# Patient Record
Sex: Female | Born: 1955 | Race: White | Hispanic: No | Marital: Married | Smoking: Former smoker
Health system: Southern US, Community
[De-identification: ages and names within clinical notes are randomized; demographics above are authoritative.]

## PROBLEM LIST (undated history)

## (undated) DIAGNOSIS — Z1501 Genetic susceptibility to malignant neoplasm of breast: Secondary | ICD-10-CM

## (undated) DIAGNOSIS — J3089 Other allergic rhinitis: Secondary | ICD-10-CM

## (undated) DIAGNOSIS — Z8742 Personal history of other diseases of the female genital tract: Secondary | ICD-10-CM

## (undated) DIAGNOSIS — J302 Other seasonal allergic rhinitis: Secondary | ICD-10-CM

## (undated) DIAGNOSIS — Z8481 Family history of carrier of genetic disease: Secondary | ICD-10-CM

## (undated) DIAGNOSIS — B009 Herpesviral infection, unspecified: Secondary | ICD-10-CM

## (undated) DIAGNOSIS — Z973 Presence of spectacles and contact lenses: Secondary | ICD-10-CM

## (undated) DIAGNOSIS — I10 Essential (primary) hypertension: Secondary | ICD-10-CM

## (undated) DIAGNOSIS — Z803 Family history of malignant neoplasm of breast: Secondary | ICD-10-CM

## (undated) DIAGNOSIS — Z1509 Genetic susceptibility to other malignant neoplasm: Secondary | ICD-10-CM

## (undated) DIAGNOSIS — Z8041 Family history of malignant neoplasm of ovary: Secondary | ICD-10-CM

## (undated) DIAGNOSIS — Z15060 Genetic susceptibility to colorectal cancer: Secondary | ICD-10-CM

## (undated) DIAGNOSIS — L309 Dermatitis, unspecified: Secondary | ICD-10-CM

## (undated) DIAGNOSIS — C50911 Malignant neoplasm of unspecified site of right female breast: Secondary | ICD-10-CM

## (undated) DIAGNOSIS — Z87898 Personal history of other specified conditions: Secondary | ICD-10-CM

## (undated) HISTORY — DX: Family history of malignant neoplasm of ovary: Z80.41

## (undated) HISTORY — DX: Family history of malignant neoplasm of breast: Z80.3

## (undated) HISTORY — DX: Dermatitis, unspecified: L30.9

## (undated) HISTORY — DX: Genetic susceptibility to malignant neoplasm of breast: Z15.09

## (undated) HISTORY — PX: COLONOSCOPY: SHX174

## (undated) HISTORY — DX: Genetic susceptibility to malignant neoplasm of breast: Z15.01

## (undated) HISTORY — DX: Essential (primary) hypertension: I10

## (undated) HISTORY — DX: Family history of carrier of genetic disease: Z84.81

## (undated) HISTORY — DX: Genetic susceptibility to colorectal cancer: Z15.060

---

## 1998-02-24 ENCOUNTER — Other Ambulatory Visit: Admission: RE | Admit: 1998-02-24 | Discharge: 1998-02-24 | Payer: Self-pay | Admitting: *Deleted

## 1999-04-07 ENCOUNTER — Other Ambulatory Visit: Admission: RE | Admit: 1999-04-07 | Discharge: 1999-04-07 | Payer: Self-pay | Admitting: *Deleted

## 2000-04-06 ENCOUNTER — Other Ambulatory Visit: Admission: RE | Admit: 2000-04-06 | Discharge: 2000-04-06 | Payer: Self-pay | Admitting: *Deleted

## 2001-04-24 ENCOUNTER — Other Ambulatory Visit: Admission: RE | Admit: 2001-04-24 | Discharge: 2001-04-24 | Payer: Self-pay | Admitting: *Deleted

## 2002-05-02 ENCOUNTER — Other Ambulatory Visit: Admission: RE | Admit: 2002-05-02 | Discharge: 2002-05-02 | Payer: Self-pay | Admitting: *Deleted

## 2003-05-13 ENCOUNTER — Other Ambulatory Visit: Admission: RE | Admit: 2003-05-13 | Discharge: 2003-05-13 | Payer: Self-pay | Admitting: *Deleted

## 2004-06-21 ENCOUNTER — Other Ambulatory Visit: Admission: RE | Admit: 2004-06-21 | Discharge: 2004-06-21 | Payer: Self-pay | Admitting: *Deleted

## 2005-08-09 ENCOUNTER — Other Ambulatory Visit: Admission: RE | Admit: 2005-08-09 | Discharge: 2005-08-09 | Payer: Self-pay | Admitting: *Deleted

## 2006-02-07 HISTORY — PX: DILATION AND CURETTAGE OF UTERUS: SHX78

## 2006-06-27 ENCOUNTER — Ambulatory Visit: Payer: Self-pay

## 2006-06-27 ENCOUNTER — Ambulatory Visit: Payer: Self-pay | Admitting: Cardiology

## 2006-06-27 DIAGNOSIS — Z87898 Personal history of other specified conditions: Secondary | ICD-10-CM

## 2006-06-27 HISTORY — DX: Personal history of other specified conditions: Z87.898

## 2006-09-25 ENCOUNTER — Other Ambulatory Visit: Admission: RE | Admit: 2006-09-25 | Discharge: 2006-09-25 | Payer: Self-pay | Admitting: *Deleted

## 2007-09-25 ENCOUNTER — Other Ambulatory Visit: Admission: RE | Admit: 2007-09-25 | Discharge: 2007-09-25 | Payer: Self-pay | Admitting: Gynecology

## 2008-03-11 ENCOUNTER — Ambulatory Visit (HOSPITAL_COMMUNITY): Admission: RE | Admit: 2008-03-11 | Discharge: 2008-03-11 | Payer: Self-pay | Admitting: Internal Medicine

## 2010-06-22 NOTE — Procedures (Signed)
[facility]                              EXERCISE TREADMILL   NAME:Chelsea Salinas, Chelsea Salinas                       MRN:          [id number]  DATE:06/27/2006                            DOB:          December 24, 1955    INDICATIONS FOR STUDY:  Abnormal EKG and history of hypertension.   Baseline EKG showed T wave inversion in lead III and poor R wave  progression across the anterior precordial leads.   Her blood pressure when she arrived was 154/98.  She said this morning  it was 118 systolic.  She is a bit anxious.   We exercised on the Bruce protocol for 10 minutes and 19 seconds.  Her  peak heart rate was 164, which is 97% of predicted maximum heart rate.  Met level achieved was 12.2.   Her blood pressure increased from 154/98 until 206/92.  There were no  EKG changes.  Her blood pressure came down appropriately post exercise.   CONCLUSION:  1. Negative adequate exercise treadmill.  2. Poor R wave progression across the anterior precordium, probably      normal variant.  3. Stress-induced hypertension.  4. Accentuated blood pressure response to exercise.   Overall, this is a negative adequate study.  I reinforced this to the  patient.  She will follow up with her blood pressure with Dr. Elisabeth Most.     Thomas C. Daleen Squibb, MD, [facility]  Electronically Signed    TCW/MedQ  DD: 06/27/2006  DT: 06/27/2006  Job #: [id number]   cc:   Lovenia Kim, D.O.

## 2011-02-08 LAB — HM MAMMOGRAPHY: HM Mammogram: NORMAL

## 2011-11-08 LAB — HM PAP SMEAR: HM Pap smear: NORMAL

## 2012-12-19 ENCOUNTER — Other Ambulatory Visit: Payer: Self-pay | Admitting: Internal Medicine

## 2012-12-19 MED ORDER — MONTELUKAST SODIUM 10 MG PO TABS: 10.0000 mg | ORAL_TABLET | Freq: Every day | ORAL | Status: DC

## 2013-01-20 ENCOUNTER — Encounter: Payer: Self-pay | Admitting: Internal Medicine

## 2013-01-20 DIAGNOSIS — T7840XA Allergy, unspecified, initial encounter: Secondary | ICD-10-CM | POA: Insufficient documentation

## 2013-01-20 DIAGNOSIS — I10 Essential (primary) hypertension: Secondary | ICD-10-CM | POA: Insufficient documentation

## 2013-01-20 DIAGNOSIS — L309 Dermatitis, unspecified: Secondary | ICD-10-CM | POA: Insufficient documentation

## 2013-01-20 DIAGNOSIS — J45909 Unspecified asthma, uncomplicated: Secondary | ICD-10-CM | POA: Insufficient documentation

## 2013-01-21 ENCOUNTER — Other Ambulatory Visit: Payer: Self-pay | Admitting: Emergency Medicine

## 2013-01-23 ENCOUNTER — Ambulatory Visit (INDEPENDENT_AMBULATORY_CARE_PROVIDER_SITE_OTHER): Payer: 59 | Admitting: Emergency Medicine

## 2013-01-23 ENCOUNTER — Encounter: Payer: Self-pay | Admitting: Emergency Medicine

## 2013-01-23 VITALS — BP 118/82 | HR 74 | Temp 98.6°F | Resp 16 | Ht 65.0 in | Wt 152.0 lb

## 2013-01-23 DIAGNOSIS — N951 Menopausal and female climacteric states: Secondary | ICD-10-CM

## 2013-01-23 DIAGNOSIS — R200 Anesthesia of skin: Secondary | ICD-10-CM

## 2013-01-23 DIAGNOSIS — I10 Essential (primary) hypertension: Secondary | ICD-10-CM

## 2013-01-23 DIAGNOSIS — R232 Flushing: Secondary | ICD-10-CM

## 2013-01-23 DIAGNOSIS — E538 Deficiency of other specified B group vitamins: Secondary | ICD-10-CM

## 2013-01-23 DIAGNOSIS — E782 Mixed hyperlipidemia: Secondary | ICD-10-CM | POA: Insufficient documentation

## 2013-01-23 DIAGNOSIS — J309 Allergic rhinitis, unspecified: Secondary | ICD-10-CM

## 2013-01-23 LAB — CBC WITH DIFFERENTIAL/PLATELET
Basophils Absolute: 0 10*3/uL (ref 0.0–0.1)
Basophils Relative: 1 % (ref 0–1)
Eosinophils Absolute: 0.1 10*3/uL (ref 0.0–0.7)
Eosinophils Relative: 2 % (ref 0–5)
Lymphocytes Relative: 41 % (ref 12–46)
MCHC: 33.9 g/dL (ref 30.0–36.0)
MCV: 95.6 fL (ref 78.0–100.0)
Platelets: 206 10*3/uL (ref 150–400)
RDW: 13.8 % (ref 11.5–15.5)
WBC: 4.2 10*3/uL (ref 4.0–10.5)

## 2013-01-23 LAB — HEPATIC FUNCTION PANEL
Bilirubin, Direct: 0.1 mg/dL (ref 0.0–0.3)
Indirect Bilirubin: 0.3 mg/dL (ref 0.0–0.9)
Total Protein: 7 g/dL (ref 6.0–8.3)

## 2013-01-23 LAB — BASIC METABOLIC PANEL WITH GFR
CO2: 30 mEq/L (ref 19–32)
Calcium: 9.8 mg/dL (ref 8.4–10.5)
Creat: 0.67 mg/dL (ref 0.50–1.10)

## 2013-01-23 LAB — LIPID PANEL
HDL: 125 mg/dL (ref >39)
LDL Cholesterol: 82 mg/dL (ref 0–99)
Triglycerides: 79 mg/dL (ref <150)
VLDL: 16 mg/dL (ref 0–40)

## 2013-01-23 MED ORDER — CLONIDINE HCL 0.2 MG PO TABS: 0.2000 mg | ORAL_TABLET | Freq: Every day | ORAL | Status: DC

## 2013-01-23 MED ORDER — OLMESARTAN MEDOXOMIL 40 MG PO TABS: 40.0000 mg | ORAL_TABLET | Freq: Every day | ORAL | Status: DC

## 2013-01-23 MED ORDER — MONTELUKAST SODIUM 10 MG PO TABS: 10.0000 mg | ORAL_TABLET | Freq: Every day | ORAL | Status: DC

## 2013-01-23 MED ORDER — VENLAFAXINE HCL ER 75 MG PO CP24: ORAL_CAPSULE | ORAL | Status: DC

## 2013-01-23 NOTE — Patient Instructions (Signed)
Carpal Tunnel Syndrome The carpal tunnel is an area under the skin of the palm of your hand. Nerves, blood vessels, and strong tissues (tendons) pass through the tunnel. The tunnel can become puffy (swollen). If this happens, a nerve can be pinched in the wrist. This causes carpal tunnel syndrome.  HOME CARE  Take all medicine as told by your doctor.  If you were given a splint, wear it as told. Wear it at night or at times when your doctor told you to.  Rest your wrist from the activity that causes your pain.  Put ice on your wrist after long periods of wrist activity.  Put ice in a plastic bag.  Place a towel between your skin and the bag.  Leave the ice on for 15-20 minutes, 03-04 times a day.  Keep all doctor visits as told. GET HELP RIGHT AWAY IF:  You have new problems you cannot explain.  Your problems get worse and medicine does not help. MAKE SURE YOU:   Understand these instructions.  Will watch your condition.  Will get help right away if you are not doing well or get worse. Document Released: 01/13/2011 Document Revised: 04/18/2011 Document Reviewed: 01/13/2011 Exit[employer] Patient Information 2014 [employer].  

## 2013-01-27 NOTE — Progress Notes (Signed)
Subjective:    Patient ID: Chelsea Salinas, female    DOB: 09-Feb-1955, 57 y.o.   MRN: [id number]  HPI Comments: 57 yo female presents for 3 month F/U for HTN, Cholesterol, Pre-Dm, D. Deficient. She has been eating healthy and exercising with riding bike. LAST LABS T 209 TG 87 HDL 115 LDL 77 MAG 1.7 D 67 A1C 5.2   She has noticed mild increase with allergy drainage over last week but notes clear thin production. Denies sinus pain.  She notes left hand numbness after sleeping and riding bike has increased over last couple of weeks. She notes usually resolves with rest or change of position and Advil.  She has a history of abnormal pap with Dr Chevis Pretty will get repeat in 6 months. She denies any changes.       Hypertension  Asthma Associated symptoms include postnasal drip. Her past medical history is significant for asthma.    Current Outpatient Prescriptions on File Prior to Visit  Medication Sig Dispense Refill  . aspirin 81 MG tablet Take 81 mg by mouth daily.      . Multiple Vitamins-Minerals (MULTIVITAMIN WITH MINERALS) tablet Take 1 tablet by mouth daily.      . Omega-3 Fatty Acids (FISH OIL) 1000 MG CAPS Take by mouth daily.      . vitamin B-12 (CYANOCOBALAMIN) 100 MCG tablet Take 100 mcg by mouth every other day.        No current facility-administered medications on file prior to visit.    Review of patient's allergies indicates no known allergies.  Past Medical History  Diagnosis Date  . Hypertension   . Asthma   . Allergy   . Eczema      Review of Systems  HENT: Positive for postnasal drip.   Neurological: Positive for numbness.  All other systems reviewed and are negative.   BP 118/82  Pulse 74  Temp(Src) 98.6 F (37 C) (Temporal)  Resp 16  Ht 5\' 5"  (1.651 m)  Wt 152 lb (68.947 kg)  BMI 25.29 kg/m2     Objective:   Physical Exam  Nursing note and vitals reviewed. Constitutional: She is oriented to person, place, and time. She appears  well-developed and well-nourished. No distress.  HENT:  Head: Normocephalic and atraumatic.  Right Ear: External ear normal.  Left Ear: External ear normal.  Nose: Nose normal.  Mouth/Throat: Oropharynx is clear and moist.  Eyes: Conjunctivae and EOM are normal.  Neck: Normal range of motion. Neck supple. No JVD present. No thyromegaly present.  Cardiovascular: Normal rate, regular rhythm, normal heart sounds and intact distal pulses.   Pulmonary/Chest: Effort normal and breath sounds normal.  Abdominal: Soft. Bowel sounds are normal. She exhibits no distension and no mass. There is no tenderness. There is no rebound and no guarding.  Musculoskeletal: Normal range of motion. She exhibits no edema and no tenderness.  Lymphadenopathy:    She has no cervical adenopathy.  Neurological: She is alert and oriented to person, place, and time. She has normal reflexes. No cranial nerve deficit. Coordination normal.  No obvious deficit/ tenderness  Skin: Skin is warm and dry. No rash noted. No erythema. No pallor.  Psychiatric: She has a normal mood and affect. Her behavior is normal. Judgment and thought content normal.          Assessment & Plan:   1.  3 month F/U for HTN, Cholesterol, Pre-Dm, D. Deficient. Needs healthy diet, cardio QD and obtain healthy weight. Check  Labs, Check BP if >130/80 call office 2. ? b12 Def with HX of hand tingling/ numbness- Check labs, sleep and bike hygiene explained. Advised cockup splint with activity and sleep. 3. Allergic rhinitis- Allegra OTC, increase H2o, allergy hygiene explained.

## 2013-02-13 NOTE — Progress Notes (Signed)
LMOM TO CALL & SCHEDULE 4 MTH

## 2013-06-19 ENCOUNTER — Ambulatory Visit (INDEPENDENT_AMBULATORY_CARE_PROVIDER_SITE_OTHER): Payer: 59 | Admitting: Physician Assistant

## 2013-06-19 ENCOUNTER — Encounter: Payer: Self-pay | Admitting: Physician Assistant

## 2013-06-19 VITALS — BP 142/88 | HR 80 | Temp 98.2°F | Resp 16 | Ht 65.0 in | Wt 147.0 lb

## 2013-06-19 DIAGNOSIS — E782 Mixed hyperlipidemia: Secondary | ICD-10-CM

## 2013-06-19 DIAGNOSIS — I10 Essential (primary) hypertension: Secondary | ICD-10-CM

## 2013-06-19 DIAGNOSIS — J45909 Unspecified asthma, uncomplicated: Secondary | ICD-10-CM

## 2013-06-19 DIAGNOSIS — J309 Allergic rhinitis, unspecified: Secondary | ICD-10-CM

## 2013-06-19 DIAGNOSIS — N951 Menopausal and female climacteric states: Secondary | ICD-10-CM

## 2013-06-19 DIAGNOSIS — R232 Flushing: Secondary | ICD-10-CM

## 2013-06-19 MED ORDER — MONTELUKAST SODIUM 10 MG PO TABS: 10.0000 mg | ORAL_TABLET | Freq: Every day | ORAL | Status: DC

## 2013-06-19 MED ORDER — CLONIDINE HCL 0.2 MG PO TABS: 0.2000 mg | ORAL_TABLET | Freq: Every day | ORAL | Status: DC

## 2013-06-19 MED ORDER — VENLAFAXINE HCL ER 75 MG PO CP24: ORAL_CAPSULE | ORAL | Status: DC

## 2013-06-19 MED ORDER — TRIAMCINOLONE ACETONIDE 0.1 % EX CREA: 1.0000 "application " | TOPICAL_CREAM | Freq: Two times a day (BID) | CUTANEOUS | Status: DC

## 2013-06-19 MED ORDER — OLMESARTAN MEDOXOMIL 40 MG PO TABS: 40.0000 mg | ORAL_TABLET | Freq: Every day | ORAL | Status: DC

## 2013-06-19 NOTE — Progress Notes (Signed)
HPI 58 y.o. female  presents for 3 month follow up with hypertension, hyperlipidemia, prediabetes and vitamin D. Her blood pressure has not been controlled at home, she does not, today their BP is BP: 142/88 mmHg She does workout. She denies check chest pain, shortness of breath, dizziness.  She is not on cholesterol medication and denies myalgias. Her cholesterol is at goal. The cholesterol last visit was:   Lab Results  Component Value Date   CHOL 223* 01/23/2013   HDL 125 01/23/2013   LDLCALC 82 01/23/2013   TRIG 79 01/23/2013   CHOLHDL 1.8 01/23/2013   Last A1C in the office was: 5.2 Patient is on Vitamin D supplement.     Current Medications:  Current Outpatient Prescriptions on File Prior to Visit  Medication Sig Dispense Refill  . aspirin 81 MG tablet Take 81 mg by mouth daily.      . Cholecalciferol (VITAMIN D-3) 1000 UNITS CAPS Take by mouth 2 (two) times daily.      . cloNIDine (CATAPRES) 0.2 MG tablet Take 1 tablet (0.2 mg total) by mouth at bedtime.  90 tablet  1  . Flaxseed, Linseed, (FLAX SEEDS PO) Take by mouth daily.      . Magnesium 250 MG TABS Take by mouth every other day.      . montelukast (SINGULAIR) 10 MG tablet Take 1 tablet (10 mg total) by mouth daily.  90 tablet  2  . Multiple Vitamins-Minerals (MULTIVITAMIN WITH MINERALS) tablet Take 1 tablet by mouth daily.      Marland Kitchen olmesartan (BENICAR) 40 MG tablet Take 1 tablet (40 mg total) by mouth daily. 1/2 daily  90 tablet  1  . Omega-3 Fatty Acids (FISH OIL) 1000 MG CAPS Take by mouth daily.      Marland Kitchen venlafaxine XR (EFFEXOR-XR) 75 MG 24 hr capsule TAKE 2 CAPSULES BY MOUTH EVERY MORNING AND 1 BY MOUTH EVERY EVENING  270 capsule  1  . vitamin B-12 (CYANOCOBALAMIN) 100 MCG tablet Take 100 mcg by mouth every other day.        No current facility-administered medications on file prior to visit.   Medical History:  Past Medical History  Diagnosis Date  . Hypertension   . Asthma   . Allergy   . Eczema    Allergies:  No Known Allergies   Review of Systems: [X]  = complains of  [ ]  = denies  General: Fatigue [ ]  Fever [ ]  Chills [ ]  Weakness [ ]   Insomnia [ ]  Eyes: Redness [ ]  Blurred vision [ ]  Diplopia [ ]   ENT: Congestion [ ]  Sinus Pain [ ]  Post Nasal Drip [ ]  Sore Throat [ ]  Earache [ ]   Cardiac: Chest pain/pressure [ ]  SOB [ ]  Orthopnea [ ]   Palpitations [ ]   Paroxysmal nocturnal dyspnea[ ]  Claudication [ ]  Edema [ ]   Pulmonary: Cough [ ]  Wheezing[ ]   SOB [ ]   Snoring [ ]   GI: Nausea [ ]  Vomiting[ ]  Dysphagia[ ]  Heartburn[ ]  Abdominal pain [ ]  Constipation [ ] ; Diarrhea [ ] ; BRBPR [ ]  Melena[ ]  GU: Hematuria[ ]  Dysuria [ ]  Nocturia[ ]  Urgency [ ]   Hesitancy [ ]  Discharge [ ]  Neuro: Headaches[ ]  Vertigo[ ]  Paresthesias[ ]  Spasm [ ]  Speech changes [ ]  Incoordination [ ]   Ortho: Arthritis [ ]  Joint pain [ ]  Muscle pain [ ]  Joint swelling [ ]  Back Pain [ ]  Skin:  Rash [ ]   Pruritis [ ]  Change in skin lesion [ ]   Psych: Depression[ ]  Anxiety[ ]  Confusion [ ]  Memory loss [ ]   Heme/Lypmh: Bleeding [ ]  Bruising [ ]  Enlarged lymph nodes [ ]   Endocrine: Visual blurring [ ]  Paresthesia [ ]  Polyuria [ ]  Polydypsea [ ]    Heat/cold intolerance [ ]  Hypoglycemia [ ]   Family history- Review and unchanged Social history- Review and unchanged Physical Exam: BP 142/88  Pulse 80  Temp(Src) 98.2 F (36.8 C)  Resp 16  Ht 5\' 5"  (1.651 m)  Wt 147 lb (66.679 kg)  BMI 24.46 kg/m2 Wt Readings from Last 3 Encounters:  06/19/13 147 lb (66.679 kg)  01/23/13 152 lb (68.947 kg)   General Appearance: Well nourished, in no apparent distress. Eyes: PERRLA, EOMs, conjunctiva no swelling or erythema Sinuses: No Frontal/maxillary tenderness ENT/Mouth: Ext aud canals clear, TMs without erythema, bulging. No erythema, swelling, or exudate on post pharynx.  Tonsils not swollen or erythematous. Hearing normal.  Neck: Supple, thyroid normal.  Respiratory: Respiratory effort normal, BS equal bilaterally without rales, rhonchi,  wheezing or stridor.  Cardio: RRR with no MRGs. Brisk peripheral pulses without edema.  Abdomen: Soft, + BS.  Non tender, no guarding, rebound, hernias, masses. Lymphatics: Non tender without lymphadenopathy.  Musculoskeletal: Full ROM, 5/5 strength, normal gait.  Skin: Warm, dry without rashes, lesions, ecchymosis.  Neuro: Cranial nerves intact. Normal muscle tone, no cerebellar symptoms. Sensation intact.  Psych: Awake and oriented X 3, normal affect, Insight and Judgment appropriate.   Assessment and Plan:  Hypertension: Continue medication, monitor blood pressure at home. Continue DASH diet. Cholesterol: Continue diet and exercise.  Vitamin D Def-  continue medications.  Will do visits every 6 months  Continue diet and meds as discussed. Further disposition pending results of labs.  [name] 10:36 AM

## 2013-06-19 NOTE — Patient Instructions (Signed)

## 2013-06-27 ENCOUNTER — Ambulatory Visit: Payer: Self-pay | Admitting: Emergency Medicine

## 2013-10-03 ENCOUNTER — Encounter: Payer: Self-pay | Admitting: Emergency Medicine

## 2013-11-12 ENCOUNTER — Encounter: Payer: Self-pay | Admitting: Physician Assistant

## 2013-11-12 ENCOUNTER — Ambulatory Visit (INDEPENDENT_AMBULATORY_CARE_PROVIDER_SITE_OTHER): Payer: 59 | Admitting: Physician Assistant

## 2013-11-12 VITALS — BP 118/70 | HR 80 | Temp 97.9°F | Resp 16 | Ht 65.0 in | Wt 154.0 lb

## 2013-11-12 DIAGNOSIS — I10 Essential (primary) hypertension: Secondary | ICD-10-CM

## 2013-11-12 DIAGNOSIS — Z0001 Encounter for general adult medical examination with abnormal findings: Secondary | ICD-10-CM

## 2013-11-12 NOTE — Progress Notes (Signed)
Complete Physical  Assessment and Plan: Hypertension-- continue medications, DASH diet, exercise and monitor at home. Call if greater than 130/80.   Asthma- controlled, continue singulair  Allergy- continue meds  Eczema- continue cream PRN    Discussed med's effects and SE's. Screening labs and tests as requested with regular follow-up as recommended.  HPI 58 y.o. female  presents for a complete physical.  Her blood pressure has been controlled at home, today their BP is BP: 118/70 mmHg She does workout. She denies chest pain, shortness of breath, dizziness.  She is not on cholesterol medication and denies myalgias. Her cholesterol is at goal. The cholesterol last visit was:   Lab Results  Component Value Date   CHOL 223* 01/23/2013   HDL 125 01/23/2013   LDLCALC 82 01/23/2013   TRIG 79 01/23/2013   CHOLHDL 1.8 01/23/2013   Last A1C in the office was: 5.2 Patient is on Vitamin D supplement.   She follows with Dr. Daneil Dolin, she had an abnormal pap and had a negative colposcopy 10/08/2013, she will have her MGM, DEXA and a repeat pap in Dec.  She has atopy with eczema, allergies, and asthma. She has been a little wheezing.   Current Medications:  Current Outpatient Prescriptions on File Prior to Visit  Medication Sig Dispense Refill  . aspirin 81 MG tablet Take 81 mg by mouth daily.      . Cetirizine HCl (ZYRTEC PO) Take by mouth.      . Cholecalciferol (VITAMIN D-3) 1000 UNITS CAPS Take by mouth 2 (two) times daily.      . cloNIDine (CATAPRES) 0.2 MG tablet Take 1 tablet (0.2 mg total) by mouth at bedtime.  30 tablet  4  . Flaxseed, Linseed, (FLAX SEEDS PO) Take by mouth daily.      . Magnesium 250 MG TABS Take by mouth every other day.      . montelukast (SINGULAIR) 10 MG tablet Take 1 tablet (10 mg total) by mouth daily.  30 tablet  5  . Multiple Vitamins-Minerals (MULTIVITAMIN WITH MINERALS) tablet Take 1 tablet by mouth daily.      Marland Kitchen olmesartan (BENICAR) 40 MG tablet Take 1  tablet (40 mg total) by mouth daily.  30 tablet  2  . Omega-3 Fatty Acids (FISH OIL) 1000 MG CAPS Take by mouth daily.      Marland Kitchen triamcinolone cream (KENALOG) 0.1 % Apply 1 application topically 2 (two) times daily.  454 g  0  . venlafaxine XR (EFFEXOR-XR) 75 MG 24 hr capsule TAKE 2 CAPSULES BY MOUTH EVERY MORNING AND 1 BY MOUTH EVERY EVENING  270 capsule  1  . vitamin B-12 (CYANOCOBALAMIN) 100 MCG tablet Take 100 mcg by mouth every other day.        No current facility-administered medications on file prior to visit.   Health Maintenance:   Immunization History  Administered Date(s) Administered  . Td 02/08/1996, 02/07/2006   Tetanus: 2008 Pneumovax: declines Flu vaccine: declines Zostavax: wants when she turns 58 Pap: 02/2013 MGM: 01/2013 will get in Dec DEXA:2013 normal at OB/GYN- will also get in Dec Colonoscopy: 2007 due 2017 EGD: DEE: 1 year ago Stress test 2008 normal  Allergies: No Known Allergies Medical History:  Past Medical History  Diagnosis Date  . Hypertension   . Asthma   . Allergy   . Eczema    Surgical History:  Past Surgical History  Procedure Laterality Date  . Dilation and curettage of uterus  2008   Family  History:  Family History  Problem Relation Age of Onset  . Polymyalgia rheumatica Mother   . Hypertension Father   . Heart disease Father   . Cancer Paternal Aunt     ovarian/cervical   Social History:  History  Substance Use Topics  . Smoking status: Former Smoker    Quit date: 02/08/1996  . Smokeless tobacco: Not on file  . Alcohol Use: Not on file   Review of Systems: [X]  = complains of  [ ]  = denies  General: Fatigue [ ]  Fever [ ]  Chills [ ]  Weakness [ ]   Insomnia [ ] Weight change [ ]  Night sweats [ ]   Change in appetite [ ]  Eyes: Redness [ ]  Blurred vision [ ]  Diplopia [ ]  Discharge [ ]   ENT: Congestion [ ]  Sinus Pain [ ]  Post Nasal Drip [ ]  Sore Throat [ ]  Earache [ ]  hearing loss [ ]  Tinnitus [ ]  Snoring [ ]   Cardiac: Chest  pain/pressure [ ]  SOB [ ]  Orthopnea [ ]   Palpitations [ ]   Paroxysmal nocturnal dyspnea[ ]  Claudication [ ]  Edema [ ]   Pulmonary: Cough [ ]  Wheezing[ ]   SOB [ ]   Pleurisy [ ]   GI: Nausea [ ]  Vomiting[ ]  Dysphagia[ ]  Heartburn[ ]  Abdominal pain [ ]  Constipation [ ] ; Diarrhea [ ]  BRBPR [ ]  Melena[ ]  Bloating [ ]  Hemorrhoids [ ]   GU: Hematuria[ ]  Dysuria [ ]  Nocturia[ ]  Urgency [ ]   Hesitancy [ ]  Discharge [ ]  Frequency [ ]   Breast:  Breast lumps [ ]   nipple discharge [ ]    Neuro: Headaches[ ]  Vertigo[ ]  Paresthesias[ ]  Spasm [ ]  Speech changes [ ]  Incoordination [ ]   Ortho: Arthritis [ ]  Joint pain [ ]  Muscle pain [ ]  Joint swelling [ ]  Back Pain [ ]  Skin:  Rash [ ]   Pruritis [ ]  Change in skin lesion [ ]   Psych: Depression[ ]  Anxiety[ ]  Confusion [ ]  Memory loss [ ]   Heme/Lypmh: Bleeding [ ]  Bruising [ ]  Enlarged lymph nodes [ ]   Endocrine: Visual blurring [ ]  Paresthesia [ ]  Polyuria [ ]  Polydypsea [ ]    Heat/cold intolerance [ ]  Hypoglycemia [ ]   Physical Exam: Estimated body mass index is 25.63 kg/(m^2) as calculated from the following:   Height as of this encounter: 5\' 5"  (1.651 m).   Weight as of this encounter: 154 lb (69.854 kg). BP 118/70  Pulse 80  Temp(Src) 97.9 F (36.6 C)  Resp 16  Ht 5\' 5"  (1.651 m)  Wt 154 lb (69.854 kg)  BMI 25.63 kg/m2 General Appearance: Well nourished, in no apparent distress. Eyes: PERRLA, EOMs, conjunctiva no swelling or erythema, normal fundi and vessels. Sinuses: No Frontal/maxillary tenderness ENT/Mouth: Ext aud canals clear, normal light reflex with TMs without erythema, bulging.  Good dentition. No erythema, swelling, or exudate on post pharynx. Tonsils not swollen or erythematous. Hearing normal.  Neck: Supple, thyroid normal. No bruits Respiratory: Respiratory effort normal, BS equal bilaterally without rales, rhonchi, wheezing or stridor. Cardio: RRR without murmurs, rubs or gallops. Brisk peripheral pulses without edema.  Chest: symmetric,  with normal excursions and percussion. Breasts: defer Abdomen: Soft, +BS. Non tender, no guarding, rebound, hernias, masses, or organomegaly. .  Lymphatics: Non tender without lymphadenopathy.  Genitourinary: defer Musculoskeletal: Full ROM all peripheral extremities,5/5 strength, and normal gait. Skin: Warm, dry without rashes, lesions, ecchymosis.  Neuro: Cranial nerves intact, reflexes equal bilaterally. Normal muscle tone, no cerebellar symptoms. Sensation intact.  Psych: Awake  and oriented X 3, normal affect, Insight and Judgment appropriate.    [name] 2:16 PM

## 2013-11-12 NOTE — Patient Instructions (Signed)

## 2013-11-13 LAB — CBC WITH DIFFERENTIAL/PLATELET
Basophils Absolute: 0.1 10*3/uL (ref 0.0–0.1)
Basophils Relative: 1 % (ref 0–1)
Eosinophils Absolute: 0.1 10*3/uL (ref 0.0–0.7)
Eosinophils Relative: 1 % (ref 0–5)
HEMATOCRIT: 38.2 % (ref 36.0–46.0)
HEMOGLOBIN: 12.9 g/dL (ref 12.0–15.0)
LYMPHS ABS: 1.6 10*3/uL (ref 0.7–4.0)
LYMPHS PCT: 25 % (ref 12–46)
MCH: 33.1 pg (ref 26.0–34.0)
MCHC: 33.8 g/dL (ref 30.0–36.0)
MCV: 97.9 fL (ref 78.0–100.0)
MONOS PCT: 10 % (ref 3–12)
Monocytes Absolute: 0.7 10*3/uL (ref 0.1–1.0)
NEUTROS ABS: 4.1 10*3/uL (ref 1.7–7.7)
NEUTROS PCT: 63 % (ref 43–77)
Platelets: 239 10*3/uL (ref 150–400)
RBC: 3.9 MIL/uL (ref 3.87–5.11)
RDW: 14.2 % (ref 11.5–15.5)
WBC: 6.5 10*3/uL (ref 4.0–10.5)

## 2013-11-13 LAB — MICROALBUMIN / CREATININE URINE RATIO
CREATININE, URINE: 162.3 mg/dL
MICROALB UR: 0.7 mg/dL (ref <2.0)
Microalb Creat Ratio: 4.3 mg/g (ref 0.0–30.0)

## 2013-11-13 LAB — IRON AND TIBC
%SAT: 33 % (ref 20–55)
Iron: 109 ug/dL (ref 42–145)
TIBC: 331 ug/dL (ref 250–470)
UIBC: 222 ug/dL (ref 125–400)

## 2013-11-13 LAB — VITAMIN B12: VITAMIN B 12: 820 pg/mL (ref 211–911)

## 2013-11-13 LAB — BASIC METABOLIC PANEL WITH GFR
BUN: 19 mg/dL (ref 6–23)
CHLORIDE: 101 meq/L (ref 96–112)
CO2: 30 meq/L (ref 19–32)
Calcium: 9.8 mg/dL (ref 8.4–10.5)
Creat: 0.9 mg/dL (ref 0.50–1.10)
GFR, EST AFRICAN AMERICAN: 81 mL/min
GFR, Est Non African American: 70 mL/min
GLUCOSE: 84 mg/dL (ref 70–99)
POTASSIUM: 4.9 meq/L (ref 3.5–5.3)
SODIUM: 140 meq/L (ref 135–145)

## 2013-11-13 LAB — LIPID PANEL
Cholesterol: 233 mg/dL — ABNORMAL HIGH (ref 0–200)
HDL: 149 mg/dL (ref >39)
LDL CALC: 62 mg/dL (ref 0–99)
TRIGLYCERIDES: 108 mg/dL (ref <150)
Total CHOL/HDL Ratio: 1.6 Ratio
VLDL: 22 mg/dL (ref 0–40)

## 2013-11-13 LAB — HEPATIC FUNCTION PANEL
ALK PHOS: 79 U/L (ref 39–117)
ALT: 16 U/L (ref 0–35)
AST: 24 U/L (ref 0–37)
Albumin: 4.6 g/dL (ref 3.5–5.2)
BILIRUBIN DIRECT: 0.1 mg/dL (ref 0.0–0.3)
BILIRUBIN INDIRECT: 0.3 mg/dL (ref 0.2–1.2)
Total Bilirubin: 0.4 mg/dL (ref 0.2–1.2)
Total Protein: 7.3 g/dL (ref 6.0–8.3)

## 2013-11-13 LAB — URINALYSIS, ROUTINE W REFLEX MICROSCOPIC
BILIRUBIN URINE: NEGATIVE
GLUCOSE, UA: NEGATIVE mg/dL
Hgb urine dipstick: NEGATIVE
Leukocytes, UA: NEGATIVE
Nitrite: NEGATIVE
PROTEIN: NEGATIVE mg/dL
SPECIFIC GRAVITY, URINE: 1.026 (ref 1.005–1.030)
Urobilinogen, UA: 1 mg/dL (ref 0.0–1.0)
pH: 6 (ref 5.0–8.0)

## 2013-11-13 LAB — FERRITIN: Ferritin: 343 ng/mL — ABNORMAL HIGH (ref 10–291)

## 2013-11-13 LAB — INSULIN, FASTING: Insulin fasting, serum: 13.2 u[IU]/mL (ref 2.0–19.6)

## 2013-11-13 LAB — MAGNESIUM: MAGNESIUM: 1.7 mg/dL (ref 1.5–2.5)

## 2013-11-13 LAB — HEMOGLOBIN A1C
Hgb A1c MFr Bld: 5.7 % — ABNORMAL HIGH (ref <5.7)
MEAN PLASMA GLUCOSE: 117 mg/dL — AB (ref <117)

## 2013-11-13 LAB — TSH: TSH: 1.273 u[IU]/mL (ref 0.350–4.500)

## 2013-11-13 LAB — VITAMIN D 25 HYDROXY (VIT D DEFICIENCY, FRACTURES): Vit D, 25-Hydroxy: 69 ng/mL (ref 30–89)

## 2013-12-14 ENCOUNTER — Other Ambulatory Visit: Payer: Self-pay | Admitting: Physician Assistant

## 2013-12-14 ENCOUNTER — Other Ambulatory Visit: Payer: Self-pay | Admitting: Emergency Medicine

## 2013-12-23 ENCOUNTER — Other Ambulatory Visit: Payer: Self-pay | Admitting: Physician Assistant

## 2013-12-24 ENCOUNTER — Other Ambulatory Visit: Payer: Self-pay | Admitting: Physician Assistant

## 2014-01-27 ENCOUNTER — Other Ambulatory Visit: Payer: Self-pay | Admitting: Internal Medicine

## 2014-01-28 ENCOUNTER — Other Ambulatory Visit: Payer: Self-pay

## 2014-01-28 MED ORDER — CLONIDINE HCL 0.2 MG PO TABS: 0.2000 mg | ORAL_TABLET | Freq: Every day | ORAL | Status: DC

## 2014-02-12 ENCOUNTER — Other Ambulatory Visit: Payer: Self-pay | Admitting: *Deleted

## 2014-02-12 MED ORDER — VENLAFAXINE HCL ER 75 MG PO CP24: ORAL_CAPSULE | ORAL | Status: DC

## 2014-02-26 ENCOUNTER — Other Ambulatory Visit: Payer: Self-pay | Admitting: Internal Medicine

## 2014-03-28 ENCOUNTER — Other Ambulatory Visit: Payer: Self-pay | Admitting: Internal Medicine

## 2014-04-23 ENCOUNTER — Other Ambulatory Visit: Payer: Self-pay | Admitting: Internal Medicine

## 2014-05-13 ENCOUNTER — Other Ambulatory Visit: Payer: Self-pay | Admitting: Physician Assistant

## 2014-05-15 ENCOUNTER — Ambulatory Visit: Payer: Self-pay | Admitting: Physician Assistant

## 2014-05-26 ENCOUNTER — Encounter: Payer: Self-pay | Admitting: Physician Assistant

## 2014-05-26 ENCOUNTER — Ambulatory Visit (INDEPENDENT_AMBULATORY_CARE_PROVIDER_SITE_OTHER): Payer: 59 | Admitting: Physician Assistant

## 2014-05-26 ENCOUNTER — Other Ambulatory Visit: Payer: Self-pay | Admitting: Physician Assistant

## 2014-05-26 VITALS — BP 156/98 | HR 86 | Temp 98.2°F | Resp 16 | Ht 65.0 in | Wt 154.0 lb

## 2014-05-26 DIAGNOSIS — R7303 Prediabetes: Secondary | ICD-10-CM

## 2014-05-26 DIAGNOSIS — E782 Mixed hyperlipidemia: Secondary | ICD-10-CM

## 2014-05-26 DIAGNOSIS — Z79899 Other long term (current) drug therapy: Secondary | ICD-10-CM

## 2014-05-26 DIAGNOSIS — I10 Essential (primary) hypertension: Secondary | ICD-10-CM

## 2014-05-26 DIAGNOSIS — R7309 Other abnormal glucose: Secondary | ICD-10-CM | POA: Insufficient documentation

## 2014-05-26 DIAGNOSIS — E559 Vitamin D deficiency, unspecified: Secondary | ICD-10-CM

## 2014-05-26 DIAGNOSIS — T7840XD Allergy, unspecified, subsequent encounter: Secondary | ICD-10-CM

## 2014-05-26 HISTORY — DX: Prediabetes: R73.03

## 2014-05-26 LAB — CBC WITH DIFFERENTIAL/PLATELET
Basophils Absolute: 0.1 10*3/uL (ref 0.0–0.1)
Basophils Relative: 1 % (ref 0–1)
EOS ABS: 0.1 10*3/uL (ref 0.0–0.7)
Eosinophils Relative: 1 % (ref 0–5)
HEMATOCRIT: 39.3 % (ref 36.0–46.0)
Hemoglobin: 13.1 g/dL (ref 12.0–15.0)
LYMPHS ABS: 1.8 10*3/uL (ref 0.7–4.0)
Lymphocytes Relative: 36 % (ref 12–46)
MCH: 32.7 pg (ref 26.0–34.0)
MCHC: 33.3 g/dL (ref 30.0–36.0)
MCV: 98 fL (ref 78.0–100.0)
MONOS PCT: 13 % — AB (ref 3–12)
MPV: 10.1 fL (ref 8.6–12.4)
Monocytes Absolute: 0.7 10*3/uL (ref 0.1–1.0)
Neutro Abs: 2.5 10*3/uL (ref 1.7–7.7)
Neutrophils Relative %: 49 % (ref 43–77)
PLATELETS: 233 10*3/uL (ref 150–400)
RBC: 4.01 MIL/uL (ref 3.87–5.11)
RDW: 13.6 % (ref 11.5–15.5)
WBC: 5.1 10*3/uL (ref 4.0–10.5)

## 2014-05-26 MED ORDER — ALBUTEROL SULFATE HFA 108 (90 BASE) MCG/ACT IN AERS: 1.0000 | INHALATION_SPRAY | Freq: Four times a day (QID) | RESPIRATORY_TRACT | Status: DC | PRN

## 2014-05-26 MED ORDER — MONTELUKAST SODIUM 10 MG PO TABS: 10.0000 mg | ORAL_TABLET | Freq: Every day | ORAL | Status: DC

## 2014-05-26 MED ORDER — VENLAFAXINE HCL ER 75 MG PO CP24: ORAL_CAPSULE | ORAL | Status: DC

## 2014-05-26 MED ORDER — OLMESARTAN MEDOXOMIL 40 MG PO TABS: 40.0000 mg | ORAL_TABLET | Freq: Every day | ORAL | Status: DC

## 2014-05-26 MED ORDER — CLONIDINE HCL 0.2 MG PO TABS: 0.2000 mg | ORAL_TABLET | Freq: Every day | ORAL | Status: DC

## 2014-05-26 MED ORDER — ESCITALOPRAM OXALATE 10 MG PO TABS: ORAL_TABLET | ORAL | Status: DC

## 2014-05-26 NOTE — Progress Notes (Signed)
Assessment and Plan:  1. Hypertension -Continue medication, monitor blood pressure at home. Continue DASH diet.  Reminder to go to the ER if any CP, SOB, nausea, dizziness, severe HA, changes vision/speech, left arm numbness and tingling and jaw pain.  2. Cholesterol -Continue diet and exercise. Check cholesterol.   3. Prediabetes  -Continue diet and exercise. Check A1C  4. Vitamin D Def - check level and continue medications.   5. Allergies Get on allegra, add flonase, and will give albuterol inhaler.  Call if worse/not better for ABX but does not need at this time  Continue diet and meds as discussed. Further disposition pending results of labs. Over 30 minutes of exam, counseling, chart review, and critical decision making was performed  HPI 59 y.o. female  presents for 3 month follow up on hypertension, cholesterol, prediabetes, and vitamin D deficiency.   Her blood pressure has been controlled at home, today their BP is BP: (!) 156/98 mmHg  She does workout, yoga and walks outside with her dogs. She denies chest pain, shortness of breath, dizziness.  She is on cholesterol medication and denies myalgias. Her cholesterol is at goal. The cholesterol last visit was:   Lab Results  Component Value Date   CHOL 233* 11/12/2013   HDL 149 11/12/2013   LDLCALC 62 11/12/2013   TRIG 108 11/12/2013   CHOLHDL 1.6 11/12/2013    She has been working on diet and exercise for prediabetes, and denies paresthesia of the feet, polydipsia, polyuria and visual disturbances. Last A1C in the office was:  Lab Results  Component Value Date   HGBA1C 5.7* 11/12/2013   Patient is on Vitamin D supplement.   Lab Results  Component Value Date   VD25OH 69 11/12/2013     Has been having allergy symptoms since Friday, she is on zyrtec. She denies fever, chills, cough, SOB, CP.   Current Medications:  Current Outpatient Prescriptions on File Prior to Visit  Medication Sig Dispense Refill  . aspirin  81 MG tablet Take 81 mg by mouth daily.    Marland Kitchen BENICAR 40 MG tablet TAKE 1 TABLET BY MOUTH EVERY DAY 90 tablet 0  . Cetirizine HCl (ZYRTEC PO) Take by mouth.    . Cholecalciferol (VITAMIN D-3) 1000 UNITS CAPS Take by mouth 2 (two) times daily.    . cloNIDine (CATAPRES) 0.2 MG tablet TAKE 1 TABLET BY MOUTH EVERY NIGHT AT BEDTIME 30 tablet 0  . Flaxseed, Linseed, (FLAX SEEDS PO) Take by mouth daily.    . Magnesium 250 MG TABS Take by mouth every other day.    . montelukast (SINGULAIR) 10 MG tablet TAKE 1 TABLET BY MOUTH DAILY 90 tablet 0  . Multiple Vitamins-Minerals (MULTIVITAMIN WITH MINERALS) tablet Take 1 tablet by mouth daily.    . Omega-3 Fatty Acids (FISH OIL) 1000 MG CAPS Take by mouth daily.    Marland Kitchen triamcinolone cream (KENALOG) 0.1 % Apply 1 application topically 2 (two) times daily. 454 g 0  . venlafaxine XR (EFFEXOR-XR) 75 MG 24 hr capsule TAKE 2 CAPSULES BY MOUTH EVERY MORNING AND 1 CAPSULE BY MOUTH EVERY EVENING 270 capsule 0  . vitamin B-12 (CYANOCOBALAMIN) 100 MCG tablet Take 100 mcg by mouth every other day.      No current facility-administered medications on file prior to visit.   Medical History:  Past Medical History  Diagnosis Date  . Hypertension   . Asthma   . Allergy   . Eczema    Allergies: No Known Allergies  Review of Systems:  Review of Systems  Constitutional: Negative.   HENT: Positive for congestion. Negative for ear discharge, ear pain, hearing loss, nosebleeds, sore throat and tinnitus.   Eyes: Negative.   Respiratory: Negative.  Negative for stridor.   Cardiovascular: Negative.   Gastrointestinal: Negative.   Genitourinary: Negative.   Musculoskeletal: Negative.   Skin: Negative.   Neurological: Negative.  Negative for headaches.  Endo/Heme/Allergies: Negative.   Psychiatric/Behavioral: Negative.     Family history- Review and unchanged Social history- Review and unchanged Physical Exam: BP 156/98 mmHg  Pulse 86  Temp(Src) 98.2 F (36.8 C)  (Temporal)  Resp 16  Ht 5\' 5"  (1.651 m)  Wt 154 lb (69.854 kg)  BMI 25.63 kg/m2 Wt Readings from Last 3 Encounters:  05/26/14 154 lb (69.854 kg)  11/12/13 154 lb (69.854 kg)  06/19/13 147 lb (66.679 kg)   General Appearance: Well nourished, in no apparent distress. Eyes: PERRLA, EOMs, conjunctiva no swelling or erythema Sinuses: No Frontal/maxillary tenderness ENT/Mouth: Ext aud canals clear, TMs without erythema, bulging. No erythema, swelling, or exudate on post pharynx.  Tonsils not swollen or erythematous. Hearing normal.  Neck: Supple, thyroid normal.  Respiratory: Respiratory effort normal, BS equal bilaterally without rales, rhonchi, wheezing or stridor.  Cardio: RRR with no MRGs. Brisk peripheral pulses without edema.  Abdomen: Soft, + BS,  Non tender, no guarding, rebound, hernias, masses. Lymphatics: Non tender without lymphadenopathy.  Musculoskeletal: Full ROM, 5/5 strength, Normal gait Skin: Warm, dry without rashes, lesions, ecchymosis.  Neuro: Cranial nerves intact. Normal muscle tone, no cerebellar symptoms. Psych: Awake and oriented X 3, normal affect, Insight and Judgment appropriate.    [name], PA-C 3:22 PM [facility]

## 2014-05-26 NOTE — Patient Instructions (Signed)
- Try the Flonase or Nasonex. Remember to spray each nostril twice towards the outer part of your eye.  Do not sniff but instead pinch your nose and tilt your head back to help the medicine get into your sinuses.  The best time to do this is at bedtime.Stop if you get blurred vision or nose bleeds.   Sinusitis can be uncomfortable. People with sinusitis have congestion with yellow/green/gray discharge, sinus pain/pressure, pain around the eyes. Sinus infections almost ALWAYS stem from a viral infection and antibiotics don't work against a virus. Even when bacteria is responsible, the infections usually clear up on their own in a week or so.   PLEASE TRY TO DO OVER THE COUNTER TREATMENT AND PREDNISONE FOR 5-7 DAYS AND IF YOU ARE NOT GETTING BETTER OR GETTING WORSE THEN YOU CAN START ON AN ANTIBIOTIC GIVEN.  Can take the prednisone AT NIGHT WITH DINNER, it take 8-12 hours to start working so it will NOT affect your sleeping if you take it at night with your food!! Take two pills the first night and 1 or two pill the second night and then 1 pill the other nights.   Risk of antibiotic use: About 1 in 4 people who take antibiotics have side effects including stomach problems, dizziness, or rashes. Those problems clear up soon after stopping the drugs, but in rare cases antibiotics can cause severe allergic reaction. Over use of antibiotics also encourages the growth of bacteria that can't be controlled easily with drugs. That makes you more vunerable to antibiotic-resistant infections and undermines the benefits of antibiotics for others.   Waste of Money: Antibiotics often aren't very expensive, but any money spent on unnecessary drugs is money down the drain.   When are antibiotics needed? Only when symptoms last longer than a week.  Start to improve but then worsen again  -It can take up to 2 weeks to feel better.   -If you do not get better in 7-10 days (Have fever, facial pain, dental pain and  swelling), then please call the office and it is now appropriate to start an antibiotic.   -Please take Tylenol or Ibuprofen for pain. -Acetaminiphen 325mg  orally every 4-6 hours for pain.  Max: 10 per day -Ibuprofen 200mg  orally every 6-8 hours for pain.  Take with food to avoid ulcers.   Max 10 per day  Please pick one of the over the counter allergy medications below and take it once daily for allergies.  Claritin or loratadine cheapest but likely the weakest  Zyrtec or certizine at night because it can make you sleepy The strongest is allegra or fexafinadine  Cheapest at [facility], [facility], [facility]  -While drinking fluids, pinch and hold nose close and swallow.  This will help open up your eustachian tubes to drain the fluid behind your ear drums. -Try steam showers to open your nasal passages.   Drink lots of water to stay hydrated and to thin mucous.  Flonase/Nasonex is to help the inflammation.  Take 2 sprays in each nostril at bedtime.  Make sure you spray towards the outside of each nostril towards the outer corner of your eye, hold nose close and tilt head back.  This will help the medication get into your sinuses.  If you do not like this medication, then use saline nasal sprays same directions as above for Flonase. Stop the medication right away if you get blurring of your vision or nose bleeds.  Sinusitis Sinusitis is redness, soreness, and inflammation  of the paranasal sinuses. Paranasal sinuses are air pockets within the bones of your face (beneath the eyes, the middle of the forehead, or above the eyes). In healthy paranasal sinuses, mucus is able to drain out, and air is able to circulate through them by way of your nose. However, when your paranasal sinuses are inflamed, mucus and air can become trapped. This can allow bacteria and other germs to grow and cause infection. Sinusitis can develop quickly and last only a short time (acute) or continue over a long period (chronic).  Sinusitis that lasts for more than 12 weeks is considered chronic.  CAUSES  Causes of sinusitis include: Allergies. Structural abnormalities, such as displacement of the cartilage that separates your nostrils (deviated septum), which can decrease the air flow through your nose and sinuses and affect sinus drainage. Functional abnormalities, such as when the small hairs (cilia) that line your sinuses and help remove mucus do not work properly or are not present. SIGNS AND SYMPTOMS  Symptoms of acute and chronic sinusitis are the same. The primary symptoms are pain and pressure around the affected sinuses. Other symptoms include: Upper toothache. Earache. Headache. Bad breath. Decreased sense of smell and taste. A cough, which worsens when you are lying flat. Fatigue. Fever. Thick drainage from your nose, which often is green and may contain pus (purulent). Swelling and warmth over the affected sinuses. DIAGNOSIS  Your health care provider will perform a physical exam. During the exam, your health care provider may: Look in your nose for signs of abnormal growths in your nostrils (nasal polyps).  Tap over the affected sinus to check for signs of infection. View the inside of your sinuses (endoscopy) using an imaging device that has a light attached (endoscope). If your health care provider suspects that you have chronic sinusitis, one or more of the following tests may be recommended: Allergy tests. Nasal culture. A sample of mucus is taken from your nose, sent to a lab, and screened for bacteria. Nasal cytology. A sample of mucus is taken from your nose and examined by your health care provider to determine if your sinusitis is related to an allergy. TREATMENT  Most cases of acute sinusitis are related to a viral infection and will resolve on their own within 10 days. Sometimes medicines are prescribed to help relieve symptoms (pain medicine, decongestants, nasal steroid sprays, or saline  sprays).  However, for sinusitis related to a bacterial infection, your health care provider will prescribe antibiotic medicines. These are medicines that will help kill the bacteria causing the infection.  Rarely, sinusitis is caused by a fungal infection. In theses cases, your health care provider will prescribe antifungal medicine. For some cases of chronic sinusitis, surgery is needed. Generally, these are cases in which sinusitis recurs more than 3 times per year, despite other treatments. HOME CARE INSTRUCTIONS  Drink plenty of water. Water helps thin the mucus so your sinuses can drain more easily. Use a humidifier. Inhale steam 3 to 4 times a day (for example, sit in the bathroom with the shower running). Apply a warm, moist washcloth to your face 3 to 4 times a day, or as directed by your health care provider. Use saline nasal sprays to help moisten and clean your sinuses. Take medicines only as directed by your health care provider. If you were prescribed either an antibiotic or antifungal medicine, finish it all even if you start to feel better. SEEK IMMEDIATE MEDICAL CARE IF: You have increasing pain or severe  headaches. You have nausea, vomiting, or drowsiness. You have swelling around your face. You have vision problems. You have a stiff neck. You have difficulty breathing. MAKE SURE YOU:  Understand these instructions. Will watch your condition. Will get help right away if you are not doing well or get worse. Document Released: 01/24/2005 Document Revised: 06/10/2013 Document Reviewed: 02/08/2011 [facility] Patient Information 2015 [place], [place]. This information is not intended to replace advice given to you by your health care provider. Make sure you discuss any questions you have with your health care provider.

## 2014-05-27 LAB — HEMOGLOBIN A1C
HEMOGLOBIN A1C: 5.5 % (ref <5.7)
Mean Plasma Glucose: 111 mg/dL (ref <117)

## 2014-05-27 LAB — BASIC METABOLIC PANEL WITH GFR
BUN: 18 mg/dL (ref 6–23)
CHLORIDE: 98 meq/L (ref 96–112)
CO2: 28 mEq/L (ref 19–32)
Calcium: 9.9 mg/dL (ref 8.4–10.5)
Creat: 0.66 mg/dL (ref 0.50–1.10)
GFR, Est African American: >89 mL/min
GFR, Est Non African American: >89 mL/min
Glucose, Bld: 81 mg/dL (ref 70–99)
POTASSIUM: 4.5 meq/L (ref 3.5–5.3)
Sodium: 137 mEq/L (ref 135–145)

## 2014-05-27 LAB — HEPATIC FUNCTION PANEL
ALT: 16 U/L (ref 0–35)
AST: 21 U/L (ref 0–37)
Albumin: 4.6 g/dL (ref 3.5–5.2)
Alkaline Phosphatase: 95 U/L (ref 39–117)
BILIRUBIN INDIRECT: 0.3 mg/dL (ref 0.2–1.2)
Bilirubin, Direct: 0.1 mg/dL (ref 0.0–0.3)
Total Bilirubin: 0.4 mg/dL (ref 0.2–1.2)
Total Protein: 7.2 g/dL (ref 6.0–8.3)

## 2014-05-27 LAB — INSULIN, FASTING: Insulin fasting, serum: 3.3 u[IU]/mL (ref 2.0–19.6)

## 2014-05-27 LAB — LIPID PANEL
Cholesterol: 237 mg/dL — ABNORMAL HIGH (ref 0–200)
HDL: 134 mg/dL (ref >=46)
LDL Cholesterol: 76 mg/dL (ref 0–99)
Total CHOL/HDL Ratio: 1.8 Ratio
Triglycerides: 134 mg/dL (ref <150)
VLDL: 27 mg/dL (ref 0–40)

## 2014-05-27 LAB — MAGNESIUM: Magnesium: 1.7 mg/dL (ref 1.5–2.5)

## 2014-05-27 LAB — VITAMIN D 25 HYDROXY (VIT D DEFICIENCY, FRACTURES): VIT D 25 HYDROXY: 47 ng/mL (ref 30–100)

## 2014-05-27 LAB — TSH: TSH: 1.307 u[IU]/mL (ref 0.350–4.500)

## 2014-06-11 ENCOUNTER — Other Ambulatory Visit: Payer: Self-pay | Admitting: Physician Assistant

## 2014-08-20 ENCOUNTER — Other Ambulatory Visit: Payer: Self-pay | Admitting: Physician Assistant

## 2014-09-07 ENCOUNTER — Other Ambulatory Visit: Payer: Self-pay | Admitting: Internal Medicine

## 2014-09-18 ENCOUNTER — Other Ambulatory Visit: Payer: Self-pay | Admitting: Physician Assistant

## 2014-09-22 ENCOUNTER — Other Ambulatory Visit: Payer: Self-pay | Admitting: Internal Medicine

## 2014-10-09 ENCOUNTER — Other Ambulatory Visit: Payer: Self-pay | Admitting: Internal Medicine

## 2014-10-23 ENCOUNTER — Other Ambulatory Visit: Payer: Self-pay | Admitting: Internal Medicine

## 2014-11-13 ENCOUNTER — Ambulatory Visit (INDEPENDENT_AMBULATORY_CARE_PROVIDER_SITE_OTHER): Payer: 59 | Admitting: Physician Assistant

## 2014-11-13 ENCOUNTER — Encounter: Payer: Self-pay | Admitting: Physician Assistant

## 2014-11-13 VITALS — BP 130/80 | HR 83 | Temp 97.3°F | Resp 14 | Ht 65.0 in | Wt 157.0 lb

## 2014-11-13 DIAGNOSIS — T7840XD Allergy, unspecified, subsequent encounter: Secondary | ICD-10-CM

## 2014-11-13 DIAGNOSIS — Z23 Encounter for immunization: Secondary | ICD-10-CM

## 2014-11-13 DIAGNOSIS — Z0000 Encounter for general adult medical examination without abnormal findings: Secondary | ICD-10-CM | POA: Diagnosis not present

## 2014-11-13 DIAGNOSIS — Z13220 Encounter for screening for lipoid disorders: Secondary | ICD-10-CM

## 2014-11-13 DIAGNOSIS — J45909 Unspecified asthma, uncomplicated: Secondary | ICD-10-CM

## 2014-11-13 DIAGNOSIS — R232 Flushing: Secondary | ICD-10-CM

## 2014-11-13 DIAGNOSIS — Z79899 Other long term (current) drug therapy: Secondary | ICD-10-CM

## 2014-11-13 DIAGNOSIS — Z1389 Encounter for screening for other disorder: Secondary | ICD-10-CM

## 2014-11-13 DIAGNOSIS — I10 Essential (primary) hypertension: Secondary | ICD-10-CM

## 2014-11-13 DIAGNOSIS — R319 Hematuria, unspecified: Secondary | ICD-10-CM

## 2014-11-13 DIAGNOSIS — E559 Vitamin D deficiency, unspecified: Secondary | ICD-10-CM

## 2014-11-13 DIAGNOSIS — L309 Dermatitis, unspecified: Secondary | ICD-10-CM

## 2014-11-13 LAB — CBC WITH DIFFERENTIAL/PLATELET
BASOS ABS: 0.1 10*3/uL (ref 0.0–0.1)
BASOS PCT: 1 % (ref 0–1)
EOS ABS: 0.1 10*3/uL (ref 0.0–0.7)
EOS PCT: 1 % (ref 0–5)
HCT: 38.6 % (ref 36.0–46.0)
Hemoglobin: 13.3 g/dL (ref 12.0–15.0)
LYMPHS ABS: 1.9 10*3/uL (ref 0.7–4.0)
Lymphocytes Relative: 33 % (ref 12–46)
MCH: 33.3 pg (ref 26.0–34.0)
MCHC: 34.5 g/dL (ref 30.0–36.0)
MCV: 96.5 fL (ref 78.0–100.0)
MONOS PCT: 12 % (ref 3–12)
MPV: 10.1 fL (ref 8.6–12.4)
Monocytes Absolute: 0.7 10*3/uL (ref 0.1–1.0)
Neutro Abs: 3.1 10*3/uL (ref 1.7–7.7)
Neutrophils Relative %: 53 % (ref 43–77)
PLATELETS: 216 10*3/uL (ref 150–400)
RBC: 4 MIL/uL (ref 3.87–5.11)
RDW: 13.7 % (ref 11.5–15.5)
WBC: 5.9 10*3/uL (ref 4.0–10.5)

## 2014-11-13 LAB — BASIC METABOLIC PANEL WITH GFR
BUN: 14 mg/dL (ref 7–25)
CO2: 30 mmol/L (ref 20–31)
CREATININE: 0.74 mg/dL (ref 0.50–0.99)
Calcium: 9.6 mg/dL (ref 8.6–10.4)
Chloride: 97 mmol/L — ABNORMAL LOW (ref 98–110)
GFR, Est Non African American: 88 mL/min (ref >=60)
Glucose, Bld: 100 mg/dL — ABNORMAL HIGH (ref 65–99)
Potassium: 4 mmol/L (ref 3.5–5.3)
SODIUM: 136 mmol/L (ref 135–146)

## 2014-11-13 LAB — HEPATIC FUNCTION PANEL
ALBUMIN: 4.6 g/dL (ref 3.6–5.1)
ALT: 19 U/L (ref 6–29)
AST: 23 U/L (ref 10–35)
Alkaline Phosphatase: 88 U/L (ref 33–130)
Bilirubin, Direct: 0.1 mg/dL (ref <=0.2)
Indirect Bilirubin: 0.3 mg/dL (ref 0.2–1.2)
TOTAL PROTEIN: 7.1 g/dL (ref 6.1–8.1)
Total Bilirubin: 0.4 mg/dL (ref 0.2–1.2)

## 2014-11-13 LAB — MAGNESIUM: Magnesium: 1.7 mg/dL (ref 1.5–2.5)

## 2014-11-13 LAB — LIPID PANEL
CHOLESTEROL: 226 mg/dL — AB (ref 125–200)
HDL: 113 mg/dL (ref >=46)
LDL Cholesterol: 86 mg/dL (ref <130)
TRIGLYCERIDES: 133 mg/dL (ref <150)
Total CHOL/HDL Ratio: 2 Ratio (ref <=5.0)
VLDL: 27 mg/dL (ref <30)

## 2014-11-13 LAB — TSH: TSH: 1.159 u[IU]/mL (ref 0.350–4.500)

## 2014-11-13 MED ORDER — ESCITALOPRAM OXALATE 10 MG PO TABS: ORAL_TABLET | ORAL | Status: DC

## 2014-11-13 MED ORDER — OLMESARTAN MEDOXOMIL 40 MG PO TABS: 40.0000 mg | ORAL_TABLET | Freq: Every day | ORAL | Status: DC

## 2014-11-13 MED ORDER — MONTELUKAST SODIUM 10 MG PO TABS: 10.0000 mg | ORAL_TABLET | Freq: Every day | ORAL | Status: DC

## 2014-11-13 MED ORDER — VENLAFAXINE HCL ER 75 MG PO CP24: ORAL_CAPSULE | ORAL | Status: DC

## 2014-11-13 MED ORDER — ALBUTEROL SULFATE HFA 108 (90 BASE) MCG/ACT IN AERS: 1.0000 | INHALATION_SPRAY | Freq: Four times a day (QID) | RESPIRATORY_TRACT | Status: DC | PRN

## 2014-11-13 MED ORDER — CLONIDINE HCL 0.2 MG PO TABS: ORAL_TABLET | ORAL | Status: DC

## 2014-11-13 NOTE — Progress Notes (Signed)
Complete Physical  Assessment and Plan: Hypertension-- continue medications, DASH diet, exercise and monitor at home. Call if greater than 130/80.   Asthma- controlled, continue singulair  Allergy- continue meds  Eczema- continue cream PRN  Hot flashes- continue medications.  Pneumovax, declines flu vaccine. Abnormal pap- continue follow up OB/GYN   Discussed med's effects and SE's. Screening labs and tests as requested with regular follow-up as recommended.  HPI 59 y.o. female  presents for a complete physical.  Her blood pressure has been controlled at home, today their BP is BP: 130/80 mmHg She does workout. She denies chest pain, shortness of breath, dizziness.  Got new motorcycle, rides horses, and has garden this year.  She is not on cholesterol medication and denies myalgias. Her cholesterol is at goal. The cholesterol last visit was:   Lab Results  Component Value Date   CHOL 237* 05/26/2014   HDL 134 05/26/2014   LDLCALC 76 05/26/2014   TRIG 134 05/26/2014   CHOLHDL 1.8 05/26/2014   Last A1C in the office was:  Lab Results  Component Value Date   HGBA1C 5.5 05/26/2014   Patient is on Vitamin D supplement.   Lab Results  Component Value Date   VD25OH 47 05/26/2014   She follows with Dr. Daneil Dolin, she had an abnormal pap and had a negative colposcopy 10/08/2013.  She has atopy with eczema, allergies, and asthma. She is on flonase for allergies, needs refill albuterol, has not been using it.  She is on lexapro and catapress for hot flashes that help.  Wt Readings from Last 3 Encounters:  11/13/14 157 lb (71.215 kg)  05/26/14 154 lb (69.854 kg)  11/12/13 154 lb (69.854 kg)    Current Medications:  Current Outpatient Prescriptions on File Prior to Visit  Medication Sig Dispense Refill  . aspirin 81 MG tablet Take 81 mg by mouth daily.    . Cetirizine HCl (ZYRTEC PO) Take by mouth.    . Cholecalciferol (VITAMIN D-3) 1000 UNITS CAPS Take by mouth 2 (two) times daily.     . cloNIDine (CATAPRES) 0.2 MG tablet TAKE 1 TABLET(0.2 MG) BY MOUTH AT BEDTIME 90 tablet 0  . Flaxseed, Linseed, (FLAX SEEDS PO) Take by mouth daily.    . Magnesium 250 MG TABS Take by mouth every other day.    . montelukast (SINGULAIR) 10 MG tablet TAKE 1 TABLET BY MOUTH EVERY DAY 30 tablet 3  . Multiple Vitamins-Minerals (MULTIVITAMIN WITH MINERALS) tablet Take 1 tablet by mouth daily.    Marland Kitchen olmesartan (BENICAR) 40 MG tablet Take 1 tablet (40 mg total) by mouth daily. 90 tablet 1  . Omega-3 Fatty Acids (FISH OIL) 1000 MG CAPS Take by mouth daily.    Marland Kitchen triamcinolone cream (KENALOG) 0.1 % Apply 1 application topically 2 (two) times daily. 454 g 0  . venlafaxine XR (EFFEXOR-XR) 75 MG 24 hr capsule TAKE 2 CAPSULES BY MOUTH EVERY MORNING AND 1 CAPSULE BY MOUTH EVERY EVENING 270 capsule 0  . vitamin B-12 (CYANOCOBALAMIN) 100 MCG tablet Take 100 mcg by mouth every other day.     . escitalopram (LEXAPRO) 10 MG tablet TAKE 1/2-1 TABLET BY MOUTH EVERY DAY FOR HOT FLASHES 90 tablet 0  . escitalopram (LEXAPRO) 10 MG tablet TAKE 1/2-1 TABLET BY MOUTH EVERY DAY FOR HOT FLASHES 30 tablet 2   No current facility-administered medications on file prior to visit.   Health Maintenance:   Immunization History  Administered Date(s) Administered  . Td 02/08/1996, 02/07/2006  Tetanus: 2008 Pneumovax: willing to get Prevnar 13: due age 19 Flu vaccine: declines Zostavax: 2015 at [facility] Pap: 10/27/2013, q 6 months, but last 2 was normal, going to follow up Dr. Quincy Simmonds.  MGM: 01/2014 will get in Jan  DEXA: 01/2014 normal at OB/GYN  Colonoscopy: 2007 due 2017 EGD: N/A DEE: 1 year ago Stress test 2008 normal  Patient Care Team: [name], MD as PCP - General (Internal Medicine) Renella Cunas, MD as Consulting Physician (Cardiology) Delila Pereyra, MD as Consulting Physician (Gynecology) [name], MD as Consulting Physician (Dermatology)    Allergies: No Known Allergies Medical History:   Past Medical History  Diagnosis Date  . Hypertension   . Asthma   . Allergy   . Eczema    Surgical History:  Past Surgical History  Procedure Laterality Date  . Dilation and curettage of uterus  2008   Family History:  Family History  Problem Relation Age of Onset  . Polymyalgia rheumatica Mother   . Hypertension Father   . Heart disease Father   . Cancer Paternal Aunt     ovarian/cervical   Social History:  Social History  Substance Use Topics  . Smoking status: Former Smoker    Quit date: 02/08/1996  . Smokeless tobacco: None  . Alcohol Use: None   Review of Systems  Constitutional: Negative.   HENT: Positive for congestion. Negative for ear discharge, ear pain, hearing loss, nosebleeds, sore throat and tinnitus.   Eyes: Negative.   Respiratory: Negative.  Negative for stridor.   Cardiovascular: Negative.   Gastrointestinal: Negative.   Genitourinary: Negative.   Musculoskeletal: Negative.   Skin: Positive for rash. Negative for itching.  Neurological: Negative.  Negative for headaches.  Endo/Heme/Allergies: Negative.   Psychiatric/Behavioral: Negative.      Physical Exam: Estimated body mass index is 26.13 kg/(m^2) as calculated from the following:   Height as of this encounter: 5\' 5"  (1.651 m).   Weight as of this encounter: 157 lb (71.215 kg). BP 130/80 mmHg  Pulse 83  Temp(Src) 97.3 F (36.3 C) (Temporal)  Resp 14  Ht 5\' 5"  (1.651 m)  Wt 157 lb (71.215 kg)  BMI 26.13 kg/m2  SpO2 98% General Appearance: Well nourished, in no apparent distress. Eyes: PERRLA, EOMs, conjunctiva no swelling or erythema, normal fundi and vessels. Sinuses: No Frontal/maxillary tenderness ENT/Mouth: Ext aud canals clear, normal light reflex with TMs without erythema, bulging.  Good dentition. No erythema, swelling, or exudate on post pharynx. Tonsils not swollen or erythematous. Hearing normal.  Neck: Supple, thyroid normal. No bruits Respiratory: Respiratory effort  normal, BS equal bilaterally without rales, rhonchi, wheezing or stridor. Cardio: RRR without murmurs, rubs or gallops. Brisk peripheral pulses without edema.  Chest: symmetric, with normal excursions and percussion. Breasts: defer Abdomen: Soft, +BS. Non tender, no guarding, rebound, hernias, masses, or organomegaly. .  Lymphatics: Non tender without lymphadenopathy.  Genitourinary: defer Musculoskeletal: Full ROM all peripheral extremities,5/5 strength, and normal gait. Skin: + dry scaly patches bilateral hands. Warm, dry without lesions, ecchymosis.  Neuro: Cranial nerves intact, reflexes equal bilaterally. Normal muscle tone, no cerebellar symptoms. Sensation intact.  Psych: Awake and oriented X 3, normal affect, Insight and Judgment appropriate.    [name] 3:02 PM

## 2014-11-13 NOTE — Patient Instructions (Signed)

## 2014-11-14 LAB — URINALYSIS, ROUTINE W REFLEX MICROSCOPIC
Bilirubin Urine: NEGATIVE
Glucose, UA: NEGATIVE
Ketones, ur: NEGATIVE
LEUKOCYTES UA: NEGATIVE
NITRITE: NEGATIVE
PH: 6 (ref 5.0–8.0)
Protein, ur: NEGATIVE
SPECIFIC GRAVITY, URINE: 1.011 (ref 1.001–1.035)

## 2014-11-14 LAB — VITAMIN D 25 HYDROXY (VIT D DEFICIENCY, FRACTURES): Vit D, 25-Hydroxy: 46 ng/mL (ref 30–100)

## 2014-11-14 LAB — URINALYSIS, MICROSCOPIC ONLY
Bacteria, UA: NONE SEEN [HPF]
CASTS: NONE SEEN [LPF]
Crystals: NONE SEEN [HPF]
RBC / HPF: NONE SEEN RBC/HPF (ref <=2)
SQUAMOUS EPITHELIAL / LPF: NONE SEEN [HPF] (ref <=5)
WBC, UA: NONE SEEN WBC/HPF (ref <=5)
Yeast: NONE SEEN [HPF]

## 2014-11-14 LAB — MICROALBUMIN / CREATININE URINE RATIO
CREATININE, URINE: 69.8 mg/dL
MICROALB/CREAT RATIO: 5.7 mg/g (ref 0.0–30.0)
Microalb, Ur: 0.4 mg/dL (ref <2.0)

## 2014-11-14 NOTE — Addendum Note (Signed)
Addended by: [name] R on: 11/14/2014 07:22 AM   Modules accepted: Orders, SmartSet

## 2015-03-02 ENCOUNTER — Other Ambulatory Visit: Payer: Self-pay | Admitting: Internal Medicine

## 2015-04-17 ENCOUNTER — Ambulatory Visit (INDEPENDENT_AMBULATORY_CARE_PROVIDER_SITE_OTHER): Payer: BLUE CROSS/BLUE SHIELD | Admitting: Obstetrics and Gynecology

## 2015-04-17 ENCOUNTER — Encounter: Payer: Self-pay | Admitting: Obstetrics and Gynecology

## 2015-04-17 VITALS — BP 146/82 | HR 80 | Resp 20 | Ht 65.0 in | Wt 155.8 lb

## 2015-04-17 DIAGNOSIS — Z01419 Encounter for gynecological examination (general) (routine) without abnormal findings: Secondary | ICD-10-CM | POA: Diagnosis not present

## 2015-04-17 MED ORDER — VALACYCLOVIR HCL 500 MG PO TABS: 500.0000 mg | ORAL_TABLET | Freq: Two times a day (BID) | ORAL | Status: DC

## 2015-04-17 NOTE — Patient Instructions (Signed)

## 2015-04-17 NOTE — Progress Notes (Signed)
Patient ID: Chelsea Salinas, female   DOB: December 25, 1955, 60 y.o.   MRN: [id number] 60 y.o. [id number] Married Caucasian female here for annual exam.    Former patient of Dr. Carren Rang.  Hot flashes.  Takes Lexapro, Effexor, and Clonidine all from PCP. Did HRT in the past but stopped due to bleeding.   Hx of HSV II.   Has a Cabin crew.  Works for [employer]. [place] and cooks. 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  PCP:  [name], MD  Patient's last menstrual period was 02/07/1998 (approximate).          Sexually active: No. female.  Not active for a while.  Patient and partner are comfortable with this. The current method of family planning is post menopausal status.    Exercising: Yes.    walking, yoga and horseback riding. Smoker:  Former;quit 1998  Health Maintenance: Pap:  04-10-14 normal per patient.  10/21/14 - normal.  History of abnormal Pap:  Yes, Hx of colposcopy and some type of treatment to cervix in her 1s which was done at the hospital. Later in life hx of abnormal paps with Dr. Carren Rang, but only had repeat paps--no treatment. MMG:  01-20-15 normal per patient.   Colonoscopy:  2007 normal with Dr. Carin Primrose due 11/2015. BMD:   ?2015  Result  Normal.   TDaP:  Current with PCP Screening Labs:  Hb today: PCP, Urine :  PCP.   reports that she quit smoking about 19 years ago. She does not have any smokeless tobacco history on file. She reports that she drinks about 9.0 oz of alcohol per week. She reports that she does not use illicit drugs.  Past Medical History  Diagnosis Date  . Hypertension   . Asthma   . Allergy   . Eczema   . STD (sexually transmitted disease)     HSV II  . Abnormal Pap smear of cervix     --in her 20s had colpo and some type of treatment to cervix.    Past Surgical History  Procedure Laterality Date  . Dilation and curettage of uterus  2008    Current Outpatient Prescriptions  Medication Sig Dispense  Refill  . albuterol (PROVENTIL HFA;VENTOLIN HFA) 108 (90 BASE) MCG/ACT inhaler Inhale 1-2 puffs into the lungs every 6 (six) hours as needed for wheezing or shortness of breath. 18 g 2  . aspirin 81 MG tablet Take 81 mg by mouth daily.    . Cetirizine HCl (ZYRTEC PO) Take by mouth.    . Cholecalciferol (VITAMIN D-3) 1000 UNITS CAPS Take by mouth 2 (two) times daily.    . cloNIDine (CATAPRES) 0.2 MG tablet TAKE 1 TABLET(0.2 MG) BY MOUTH AT BEDTIME 90 tablet 1  . cloNIDine (CATAPRES) 0.2 MG tablet TAKE 1 TABLET BY MOUTH AT BEDTIME 90 tablet 0  . escitalopram (LEXAPRO) 10 MG tablet TAKE 1/2-1 TABLET BY MOUTH EVERY DAY FOR HOT FLASHES (Patient taking differently: Take 10 mg by mouth daily. TAKE 1/2-1 TABLET BY MOUTH EVERY DAY FOR HOT FLASHES) 90 tablet 1  . Flaxseed, Linseed, (FLAX SEEDS PO) Take by mouth daily.    . Magnesium 250 MG TABS Take by mouth every other day.    . montelukast (SINGULAIR) 10 MG tablet Take 1 tablet (10 mg total) by mouth daily. 90 tablet 1  . Multiple Vitamins-Minerals (MULTIVITAMIN WITH MINERALS) tablet Take 1 tablet by mouth daily.    Marland Kitchen olmesartan (BENICAR) 40 MG  tablet Take 1 tablet (40 mg total) by mouth daily. (Patient taking differently: Take 40 mg by mouth daily. Patient takes 1/2 tablet daily) 90 tablet 1  . Omega-3 Fatty Acids (FISH OIL) 1000 MG CAPS Take by mouth daily.    Marland Kitchen triamcinolone cream (KENALOG) 0.1 % Apply 1 application topically 2 (two) times daily. 454 g 0  . venlafaxine XR (EFFEXOR-XR) 75 MG 24 hr capsule TAKE 2 CAPSULES BY MOUTH EVERY MORNING AND 1 CAPSULE BY MOUTH EVERY EVENING 270 capsule 1  . vitamin B-12 (CYANOCOBALAMIN) 100 MCG tablet Take 100 mcg by mouth every other day.      No current facility-administered medications for this visit.    Family History  Problem Relation Age of Onset  . Polymyalgia rheumatica Mother   . Hypertension Father   . Heart disease Father     Dec 69  . Heart attack Father   . Cancer Paternal Aunt      ovarian/cervical  . Ovarian cancer Paternal Aunt   . Breast cancer Cousin   . Ovarian cancer Cousin     ROS:  Pertinent items are noted in HPI.  Otherwise, a comprehensive ROS was negative.  Exam:   BP 146/82 mmHg  Pulse 80  Resp 20  Ht 5\' 5"  (1.651 m)  Wt 155 lb 12.8 oz (70.67 kg)  BMI 25.93 kg/m2  LMP 02/07/1998 (Approximate)    General appearance: alert, cooperative and appears stated age Head: Normocephalic, without obvious abnormality, atraumatic Neck: no adenopathy, supple, symmetrical, trachea midline and thyroid normal to inspection and palpation Lungs: clear to auscultation bilaterally Breasts: normal appearance, no masses or tenderness, Inspection negative, No nipple retraction or dimpling, No nipple discharge or bleeding, No axillary or supraclavicular adenopathy Heart: regular rate and rhythm Abdomen: soft, non-tender; bowel sounds normal; no masses,  no organomegaly Extremities: extremities normal, atraumatic, no cyanosis or edema Skin: Skin color, texture, turgor normal. No rashes or lesions Lymph nodes: Cervical, supraclavicular, and axillary nodes normal. No abnormal inguinal nodes palpated Neurologic: Grossly normal  Pelvic: External genitalia:  no lesions              Urethra:  normal appearing urethra with no masses, tenderness or lesions              Bartholins and Skenes: normal                 Vagina: normal appearing vagina with normal color and discharge, no lesions.  Atrophy changes noted.              Cervix: no lesions.  Veins on cervix?               Pap taken: Yes.   Bimanual Exam:  Uterus:  normal size, contour, position, consistency, mobility, non-tender              Adnexa: normal adnexa and no mass, fullness, tenderness              Rectovaginal: Yes.  .  Confirms.              Anus:  normal sphincter tone, no lesions  Chaperone was present for exam.  Assessment:   Well woman visit with normal exam. Hx of abnormal paps and treatment in the  remote past.  Hx HSV II. Menopausal symptoms - treated with nonhormonal medication through PCP.  FH breast and ovarian cancer.  Second degree relatives.  Plan: Yearly mammogram recommended after age 65.  Recommended self breast  exam.  Pap and HR HPV as above. Discussion of abnormal paps and HPV. Discussed Calcium, Vitamin D, regular exercise program including cardiovascular and weight bearing exercise. Labs performed.  No..   See orders.   Refills given on medications.  Yes.  .  See orders.  Valtrex.  Follow up annually and prn.      After visit summary provided.

## 2015-04-21 LAB — IPS PAP TEST WITH HPV

## 2015-05-03 ENCOUNTER — Other Ambulatory Visit: Payer: Self-pay | Admitting: Physician Assistant

## 2015-05-18 ENCOUNTER — Ambulatory Visit (INDEPENDENT_AMBULATORY_CARE_PROVIDER_SITE_OTHER): Payer: BLUE CROSS/BLUE SHIELD | Admitting: Physician Assistant

## 2015-05-18 ENCOUNTER — Encounter: Payer: Self-pay | Admitting: Physician Assistant

## 2015-05-18 VITALS — BP 130/86 | HR 82 | Temp 97.5°F | Resp 16 | Ht 65.0 in | Wt 155.0 lb

## 2015-05-18 DIAGNOSIS — J45909 Unspecified asthma, uncomplicated: Secondary | ICD-10-CM | POA: Diagnosis not present

## 2015-05-18 DIAGNOSIS — N951 Menopausal and female climacteric states: Secondary | ICD-10-CM | POA: Diagnosis not present

## 2015-05-18 DIAGNOSIS — E559 Vitamin D deficiency, unspecified: Secondary | ICD-10-CM | POA: Insufficient documentation

## 2015-05-18 DIAGNOSIS — I10 Essential (primary) hypertension: Secondary | ICD-10-CM

## 2015-05-18 DIAGNOSIS — E782 Mixed hyperlipidemia: Secondary | ICD-10-CM

## 2015-05-18 DIAGNOSIS — R232 Flushing: Secondary | ICD-10-CM | POA: Insufficient documentation

## 2015-05-18 DIAGNOSIS — Z79899 Other long term (current) drug therapy: Secondary | ICD-10-CM

## 2015-05-18 DIAGNOSIS — L309 Dermatitis, unspecified: Secondary | ICD-10-CM | POA: Diagnosis not present

## 2015-05-18 DIAGNOSIS — T7840XD Allergy, unspecified, subsequent encounter: Secondary | ICD-10-CM

## 2015-05-18 LAB — CBC WITH DIFFERENTIAL/PLATELET
Basophils Absolute: 48 cells/uL (ref 0–200)
Basophils Relative: 1 %
Eosinophils Absolute: 96 cells/uL (ref 15–500)
Eosinophils Relative: 2 %
HCT: 42.1 % (ref 35.0–45.0)
Hemoglobin: 13.9 g/dL (ref 11.7–15.5)
LYMPHS ABS: 1680 {cells}/uL (ref 850–3900)
Lymphocytes Relative: 35 %
MCH: 32.6 pg (ref 27.0–33.0)
MCHC: 33 g/dL (ref 32.0–36.0)
MCV: 98.8 fL (ref 80.0–100.0)
MPV: 10.2 fL (ref 7.5–12.5)
Monocytes Absolute: 384 cells/uL (ref 200–950)
Monocytes Relative: 8 %
NEUTROS PCT: 54 %
Neutro Abs: 2592 cells/uL (ref 1500–7800)
PLATELETS: 236 10*3/uL (ref 140–400)
RBC: 4.26 MIL/uL (ref 3.80–5.10)
RDW: 14.2 % (ref 11.0–15.0)
WBC: 4.8 10*3/uL (ref 3.8–10.8)

## 2015-05-18 LAB — HEPATIC FUNCTION PANEL
ALBUMIN: 4.7 g/dL (ref 3.6–5.1)
ALT: 18 U/L (ref 6–29)
AST: 19 U/L (ref 10–35)
Alkaline Phosphatase: 83 U/L (ref 33–130)
BILIRUBIN TOTAL: 0.5 mg/dL (ref 0.2–1.2)
Bilirubin, Direct: 0.1 mg/dL (ref <=0.2)
Indirect Bilirubin: 0.4 mg/dL (ref 0.2–1.2)
TOTAL PROTEIN: 7.1 g/dL (ref 6.1–8.1)

## 2015-05-18 LAB — TSH: TSH: 2.23 m[IU]/L

## 2015-05-18 LAB — BASIC METABOLIC PANEL WITH GFR
BUN: 14 mg/dL (ref 7–25)
CO2: 32 mmol/L — ABNORMAL HIGH (ref 20–31)
CREATININE: 0.88 mg/dL (ref 0.50–0.99)
Calcium: 10.1 mg/dL (ref 8.6–10.4)
Chloride: 99 mmol/L (ref 98–110)
GFR, EST AFRICAN AMERICAN: 82 mL/min (ref >=60)
GFR, Est Non African American: 71 mL/min (ref >=60)
GLUCOSE: 93 mg/dL (ref 65–99)
Potassium: 5 mmol/L (ref 3.5–5.3)
Sodium: 139 mmol/L (ref 135–146)

## 2015-05-18 LAB — LIPID PANEL
Cholesterol: 211 mg/dL — ABNORMAL HIGH (ref 125–200)
HDL: 116 mg/dL (ref >=46)
LDL Cholesterol: 73 mg/dL (ref <130)
TRIGLYCERIDES: 112 mg/dL (ref <150)
Total CHOL/HDL Ratio: 1.8 Ratio (ref <=5.0)
VLDL: 22 mg/dL (ref <30)

## 2015-05-18 LAB — MAGNESIUM: Magnesium: 1.9 mg/dL (ref 1.5–2.5)

## 2015-05-18 MED ORDER — ESCITALOPRAM OXALATE 20 MG PO TABS: 20.0000 mg | ORAL_TABLET | Freq: Every day | ORAL | Status: DC

## 2015-05-18 NOTE — Patient Instructions (Signed)

## 2015-05-18 NOTE — Progress Notes (Signed)
Assessment and Plan:  1. Hypertension -Continue medication, monitor blood pressure at home. Continue DASH diet.  Reminder to go to the ER if any CP, SOB, nausea, dizziness, severe HA, changes vision/speech, left arm numbness and tingling and jaw pain.  2. Cholesterol -Continue diet and exercise. Check cholesterol.   3. Prediabetes  -Continue diet and exercise. Check A1C  4. Vitamin D Def - check level and continue medications.   5. Allergies Get on allegra  Continue diet and meds as discussed. Further disposition pending results of labs. Over 30 minutes of exam, counseling, chart review, and critical decision making was performed  HPI 60 y.o. female  presents for 3 month follow up on hypertension, cholesterol, prediabetes, and vitamin D deficiency.   Her blood pressure has been controlled at home, today their BP is BP: 130/86 mmHg  She does workout, yoga and walks outside with her dogs. She denies chest pain, shortness of breath, dizziness.  She is not on cholesterol medication and denies myalgias. Her cholesterol is at goal. The cholesterol last visit was:   Lab Results  Component Value Date   CHOL 226* 11/13/2014   HDL 113 11/13/2014   LDLCALC 86 11/13/2014   TRIG 133 11/13/2014   CHOLHDL 2.0 11/13/2014    She has been working on diet and exercise for prediabetes, and denies paresthesia of the feet, polydipsia, polyuria and visual disturbances. Last A1C in the office was:  Lab Results  Component Value Date   HGBA1C 5.5 05/26/2014   Patient is on Vitamin D supplement.   Lab Results  Component Value Date   VD25OH 46 11/13/2014     Has been having allergy symptoms since Friday. She denies fever, chills, cough, SOB, CP. She is not on Singulair, doing flonase and allegra. She continue to have hot flashes, clonidine does help with night time but still has consistently.   Current Medications:  Current Outpatient Prescriptions on File Prior to Visit  Medication Sig Dispense  Refill  . albuterol (PROVENTIL HFA;VENTOLIN HFA) 108 (90 BASE) MCG/ACT inhaler Inhale 1-2 puffs into the lungs every 6 (six) hours as needed for wheezing or shortness of breath. 18 g 2  . aspirin 81 MG tablet Take 81 mg by mouth daily.    . Cetirizine HCl (ZYRTEC PO) Take by mouth.    . Cholecalciferol (VITAMIN D-3) 1000 UNITS CAPS Take by mouth 2 (two) times daily.    . cloNIDine (CATAPRES) 0.2 MG tablet TAKE 1 TABLET(0.2 MG) BY MOUTH AT BEDTIME 90 tablet 1  . escitalopram (LEXAPRO) 10 MG tablet TAKE 1/2 TO 1 TABLET BY MOUTH EVERY DAY AS NEEDED FOR HOT FLASHES 90 tablet 0  . Flaxseed, Linseed, (FLAX SEEDS PO) Take by mouth daily.    . Magnesium 250 MG TABS Take by mouth every other day.    . montelukast (SINGULAIR) 10 MG tablet Take 1 tablet (10 mg total) by mouth daily. 90 tablet 1  . Multiple Vitamins-Minerals (MULTIVITAMIN WITH MINERALS) tablet Take 1 tablet by mouth daily.    Marland Kitchen olmesartan (BENICAR) 40 MG tablet Take 1 tablet (40 mg total) by mouth daily. (Patient taking differently: Take 40 mg by mouth daily. Patient takes 1/2 tablet daily) 90 tablet 1  . Omega-3 Fatty Acids (FISH OIL) 1000 MG CAPS Take by mouth daily.    Marland Kitchen triamcinolone cream (KENALOG) 0.1 % Apply 1 application topically 2 (two) times daily. 454 g 0  . valACYclovir (VALTREX) 500 MG tablet Take 1 tablet (500 mg total) by mouth  2 (two) times daily. Take for 3 days as needed. 30 tablet 1  . venlafaxine XR (EFFEXOR-XR) 75 MG 24 hr capsule TAKE 2 CAPSULES BY MOUTH EVERY MORNING AND 1 CAPSULE BY MOUTH EVERY EVENING 270 capsule 1  . vitamin B-12 (CYANOCOBALAMIN) 100 MCG tablet Take 100 mcg by mouth every other day.      No current facility-administered medications on file prior to visit.   Medical History:  Past Medical History  Diagnosis Date  . Hypertension   . Asthma   . Allergy   . Eczema   . STD (sexually transmitted disease)     HSV II  . Abnormal Pap smear of cervix     --in her 20s had colpo and some type of  treatment to cervix.   Allergies: No Known Allergies   Review of Systems:  Review of Systems  Constitutional: Negative.   HENT: Positive for congestion. Negative for ear discharge, ear pain, hearing loss, nosebleeds, sore throat and tinnitus.   Eyes: Negative.   Respiratory: Negative.  Negative for stridor.   Cardiovascular: Negative.   Gastrointestinal: Negative.   Genitourinary: Negative.   Musculoskeletal: Negative.   Skin: Negative.   Neurological: Negative.  Negative for headaches.  Endo/Heme/Allergies: Negative.   Psychiatric/Behavioral: Negative.     Family history- Review and unchanged Social history- Review and unchanged Physical Exam: BP 130/86 mmHg  Pulse 82  Temp(Src) 97.5 F (36.4 C) (Temporal)  Resp 16  Ht 5\' 5"  (1.651 m)  Wt 155 lb (70.308 kg)  BMI 25.79 kg/m2  SpO2 96%  LMP 02/07/1998 (Approximate) Wt Readings from Last 3 Encounters:  05/18/15 155 lb (70.308 kg)  04/17/15 155 lb 12.8 oz (70.67 kg)  11/13/14 157 lb (71.215 kg)   General Appearance: Well nourished, in no apparent distress. Eyes: PERRLA, EOMs, conjunctiva no swelling or erythema Sinuses: No Frontal/maxillary tenderness ENT/Mouth: Ext aud canals clear, TMs without erythema, bulging. No erythema, swelling, or exudate on post pharynx.  Tonsils not swollen or erythematous. Hearing normal.  Neck: Supple, thyroid normal.  Respiratory: Respiratory effort normal, BS equal bilaterally without rales, rhonchi, wheezing or stridor.  Cardio: RRR with no MRGs. Brisk peripheral pulses without edema.  Abdomen: Soft, + BS,  Non tender, no guarding, rebound, hernias, masses. Lymphatics: Non tender without lymphadenopathy.  Musculoskeletal: Full ROM, 5/5 strength, Normal gait Skin: Warm, dry without rashes, lesions, ecchymosis.  Neuro: Cranial nerves intact. Normal muscle tone, no cerebellar symptoms. Psych: Awake and oriented X 3, normal affect, Insight and Judgment appropriate.    [name],  PA-C 10:48 AM [facility]

## 2015-05-19 LAB — VITAMIN D 25 HYDROXY (VIT D DEFICIENCY, FRACTURES): VIT D 25 HYDROXY: 47 ng/mL (ref 30–100)

## 2015-06-21 ENCOUNTER — Other Ambulatory Visit: Payer: Self-pay | Admitting: Physician Assistant

## 2015-07-16 ENCOUNTER — Other Ambulatory Visit: Payer: Self-pay | Admitting: Physician Assistant

## 2015-09-14 ENCOUNTER — Other Ambulatory Visit: Payer: Self-pay | Admitting: Internal Medicine

## 2015-09-22 ENCOUNTER — Other Ambulatory Visit: Payer: Self-pay | Admitting: Physician Assistant

## 2015-09-22 ENCOUNTER — Encounter: Payer: Self-pay | Admitting: Physician Assistant

## 2015-09-22 MED ORDER — LOSARTAN POTASSIUM 100 MG PO TABS: 100.0000 mg | ORAL_TABLET | Freq: Every day | ORAL | 11 refills | Status: DC

## 2015-09-22 NOTE — Progress Notes (Signed)
Patient is stating that benicar is tier 3, will switch to losartan 100, may have to take whole pill.

## 2015-10-13 ENCOUNTER — Other Ambulatory Visit: Payer: Self-pay | Admitting: Internal Medicine

## 2015-11-16 ENCOUNTER — Ambulatory Visit (INDEPENDENT_AMBULATORY_CARE_PROVIDER_SITE_OTHER): Payer: BLUE CROSS/BLUE SHIELD | Admitting: Physician Assistant

## 2015-11-16 ENCOUNTER — Encounter: Payer: Self-pay | Admitting: Physician Assistant

## 2015-11-16 VITALS — BP 124/72 | HR 83 | Temp 97.5°F | Resp 14 | Ht 65.0 in | Wt 157.4 lb

## 2015-11-16 DIAGNOSIS — L309 Dermatitis, unspecified: Secondary | ICD-10-CM

## 2015-11-16 DIAGNOSIS — Z136 Encounter for screening for cardiovascular disorders: Secondary | ICD-10-CM | POA: Diagnosis not present

## 2015-11-16 DIAGNOSIS — Z0000 Encounter for general adult medical examination without abnormal findings: Secondary | ICD-10-CM | POA: Diagnosis not present

## 2015-11-16 DIAGNOSIS — Z79899 Other long term (current) drug therapy: Secondary | ICD-10-CM

## 2015-11-16 DIAGNOSIS — Z23 Encounter for immunization: Secondary | ICD-10-CM

## 2015-11-16 DIAGNOSIS — T7840XD Allergy, unspecified, subsequent encounter: Secondary | ICD-10-CM

## 2015-11-16 DIAGNOSIS — J45909 Unspecified asthma, uncomplicated: Secondary | ICD-10-CM

## 2015-11-16 DIAGNOSIS — Z13220 Encounter for screening for lipoid disorders: Secondary | ICD-10-CM

## 2015-11-16 DIAGNOSIS — I10 Essential (primary) hypertension: Secondary | ICD-10-CM | POA: Diagnosis not present

## 2015-11-16 DIAGNOSIS — E559 Vitamin D deficiency, unspecified: Secondary | ICD-10-CM

## 2015-11-16 DIAGNOSIS — R232 Flushing: Secondary | ICD-10-CM

## 2015-11-16 LAB — CBC WITH DIFFERENTIAL/PLATELET
BASOS ABS: 0 {cells}/uL (ref 0–200)
Basophils Relative: 0 %
EOS ABS: 51 {cells}/uL (ref 15–500)
Eosinophils Relative: 1 %
HEMATOCRIT: 37.8 % (ref 35.0–45.0)
HEMOGLOBIN: 12.4 g/dL (ref 11.7–15.5)
LYMPHS ABS: 1938 {cells}/uL (ref 850–3900)
LYMPHS PCT: 38 %
MCH: 32.2 pg (ref 27.0–33.0)
MCHC: 32.8 g/dL (ref 32.0–36.0)
MCV: 98.2 fL (ref 80.0–100.0)
MONO ABS: 459 {cells}/uL (ref 200–950)
MPV: 9.9 fL (ref 7.5–12.5)
Monocytes Relative: 9 %
NEUTROS PCT: 52 %
Neutro Abs: 2652 cells/uL (ref 1500–7800)
Platelets: 217 10*3/uL (ref 140–400)
RBC: 3.85 MIL/uL (ref 3.80–5.10)
RDW: 13.9 % (ref 11.0–15.0)
WBC: 5.1 10*3/uL (ref 3.8–10.8)

## 2015-11-16 LAB — HEPATIC FUNCTION PANEL
ALBUMIN: 4.3 g/dL (ref 3.6–5.1)
ALK PHOS: 75 U/L (ref 33–130)
ALT: 14 U/L (ref 6–29)
AST: 19 U/L (ref 10–35)
BILIRUBIN INDIRECT: 0.2 mg/dL (ref 0.2–1.2)
Bilirubin, Direct: 0.1 mg/dL (ref <=0.2)
TOTAL PROTEIN: 6.8 g/dL (ref 6.1–8.1)
Total Bilirubin: 0.3 mg/dL (ref 0.2–1.2)

## 2015-11-16 LAB — LIPID PANEL
CHOL/HDL RATIO: 1.9 ratio (ref <=5.0)
CHOLESTEROL: 218 mg/dL — AB (ref 125–200)
HDL: 115 mg/dL (ref >=46)
LDL Cholesterol: 70 mg/dL (ref <130)
TRIGLYCERIDES: 166 mg/dL — AB (ref <150)
VLDL: 33 mg/dL — AB (ref <30)

## 2015-11-16 LAB — BASIC METABOLIC PANEL WITH GFR
BUN: 18 mg/dL (ref 7–25)
CALCIUM: 9.4 mg/dL (ref 8.6–10.4)
CO2: 28 mmol/L (ref 20–31)
Chloride: 101 mmol/L (ref 98–110)
Creat: 0.8 mg/dL (ref 0.50–0.99)
GFR, EST NON AFRICAN AMERICAN: 80 mL/min (ref >=60)
GLUCOSE: 89 mg/dL (ref 65–99)
Potassium: 4.1 mmol/L (ref 3.5–5.3)
Sodium: 138 mmol/L (ref 135–146)

## 2015-11-16 LAB — MAGNESIUM: Magnesium: 1.8 mg/dL (ref 1.5–2.5)

## 2015-11-16 NOTE — Patient Instructions (Signed)

## 2015-11-16 NOTE — Progress Notes (Signed)
Complete Physical  Assessment and Plan: Essential hypertension -     CBC with Differential/Platelet -     BASIC METABOLIC PANEL WITH GFR -     Hepatic function panel -     TSH -     Urinalysis, Routine w reflex microscopic (not at [facility]) -     Microalbumin / creatinine urine ratio -     EKG 12-Lead  Hot flashes  Uncomplicated asthma, unspecified asthma severity, unspecified whether persistent  Vitamin D deficiency -     VITAMIN D 25 Hydroxy (Vit-D Deficiency, Fractures)  Allergic state, subsequent encounter  Eczema, unspecified type  Needs flu shot -     Cancel: Flu vaccine HIGH DOSE PF -     Flu vaccine 6-39mo preservative free IM  Medication management -     Magnesium  Screening cholesterol level -     Lipid panel  Routine general medical examination at a health care facility -     CBC with Differential/Platelet -     BASIC METABOLIC PANEL WITH GFR -     Hepatic function panel -     TSH -     Lipid panel -     Magnesium -     VITAMIN D 25 Hydroxy (Vit-D Deficiency, Fractures) -     Urinalysis, Routine w reflex microscopic (not at [facility]) -     Microalbumin / creatinine urine ratio -     EKG 12-Lead     Discussed med's effects and SE's. Screening labs and tests as requested with regular follow-up as recommended. Future Appointments Date Time Provider [place]  04/29/2016 1:30 PM [name], MD [place] None  11/22/2016 9:00 AM [name], PA-C [id number] None    HPI 60 y.o. female  presents for a complete physical.  Her blood pressure has been controlled at home, today their BP is BP: 124/72 She does workout, yoga 4-5 x a week.  She denies chest pain, shortness of breath, dizziness.  Got new motorcycle, rides horses, and has garden this year, new dog hound.  She is not on cholesterol medication and denies myalgias. Her cholesterol is at goal. The cholesterol last visit was:   Lab Results  Component Value Date   CHOL 211 (H)  05/18/2015   HDL 116 05/18/2015   LDLCALC 73 05/18/2015   TRIG 112 05/18/2015   CHOLHDL 1.8 05/18/2015   Last A1C in the office was:  Lab Results  Component Value Date   HGBA1C 5.5 05/26/2014   Patient is on Vitamin D supplement.   Lab Results  Component Value Date   VD25OH 47 05/18/2015   She follows with Dr. Quincy Simmonds she had an abnormal pap and had a negative colposcopy 10/08/2013.  She has atopy with eczema, allergies, and asthma. She is on flonase for allergies, needs refill albuterol, has not been using it.  She is on lexapro and catapress for hot flashes that help.  Wt Readings from Last 3 Encounters:  11/16/15 157 lb 6.4 oz (71.4 kg)  05/18/15 155 lb (70.3 kg)  04/17/15 155 lb 12.8 oz (70.7 kg)    Current Medications:  Current Outpatient Prescriptions on File Prior to Visit  Medication Sig Dispense Refill  . aspirin 81 MG tablet Take 81 mg by mouth daily.    . Cetirizine HCl (ZYRTEC PO) Take by mouth.    . Cholecalciferol (VITAMIN D-3) 1000 UNITS CAPS Take by mouth 2 (two) times daily.    . cloNIDine (CATAPRES)  0.2 MG tablet TAKE 1 TABLET(0.2 MG) BY MOUTH AT BEDTIME 90 tablet 1  . cloNIDine (CATAPRES) 0.2 MG tablet TAKE 1 TABLET BY MOUTH AT BEDTIME 90 tablet 0  . escitalopram (LEXAPRO) 20 MG tablet Take 1 tablet (20 mg total) by mouth daily. 90 tablet 3  . Flaxseed, Linseed, (FLAX SEEDS PO) Take by mouth daily.    Marland Kitchen losartan (COZAAR) 100 MG tablet Take 1 tablet (100 mg total) by mouth daily. 30 tablet 11  . Magnesium 250 MG TABS Take by mouth every other day.    . montelukast (SINGULAIR) 10 MG tablet TAKE 1 TABLET(10 MG) BY MOUTH DAILY 90 tablet 0  . Multiple Vitamins-Minerals (MULTIVITAMIN WITH MINERALS) tablet Take 1 tablet by mouth daily.    . Omega-3 Fatty Acids (FISH OIL) 1000 MG CAPS Take by mouth daily.    Marland Kitchen triamcinolone cream (KENALOG) 0.1 % Apply 1 application topically 2 (two) times daily. 454 g 0  . valACYclovir (VALTREX) 500 MG tablet Take 1 tablet (500 mg  total) by mouth 2 (two) times daily. Take for 3 days as needed. 30 tablet 1  . venlafaxine XR (EFFEXOR-XR) 75 MG 24 hr capsule TAKE 2 CAPSULES BY MOUTH EVERY MORNING AND 1 CAPSULE EVERY EVENING 270 capsule 1  . vitamin B-12 (CYANOCOBALAMIN) 100 MCG tablet Take 100 mcg by mouth every other day.      No current facility-administered medications on file prior to visit.    Health Maintenance:   Immunization History  Administered Date(s) Administered  . Influenza, Seasonal, Injecte, Preservative Fre 11/16/2015  . Pneumococcal Polysaccharide-23 11/13/2014  . Td 02/08/1996, 02/07/2006   Tetanus: 2008 Pneumovax: 2016 Prevnar 13: due age 85 Flu vaccine: got today Zostavax: 2015 at [facility]  Pap: 04/2015, q 6 months,  Dr. Quincy Simmonds.  MGM: 02/2015 DEXA: 01/2014 normal at OB/GYN  Colonoscopy: 2007 due 2017 schedule for Nov 22nd Dr. Watt Climes EGD: N/A DEE: 1 year ago Stress test 2008 normal  Patient Care Team: [name], MD as PCP - General (Internal Medicine) Renella Cunas, MD (Inactive) as Consulting Physician (Cardiology) [name], MD as Consulting Physician (Dermatology)    Allergies: No Known Allergies Medical History:  Past Medical History:  Diagnosis Date  . Abnormal Pap smear of cervix    --in her 20s had colpo and some type of treatment to cervix.  . Allergy   . Asthma   . Eczema   . Hypertension   . STD (sexually transmitted disease)    HSV II   Surgical History:  Past Surgical History:  Procedure Laterality Date  . DILATION AND CURETTAGE OF UTERUS  2008   Family History:  Family History  Problem Relation Age of Onset  . Polymyalgia rheumatica Mother   . Hypertension Father   . Heart disease Father     Dec 69  . Heart attack Father   . Cancer Paternal Aunt     ovarian/cervical  . Ovarian cancer Paternal Aunt   . Breast cancer Cousin   . Ovarian cancer Cousin    Social History:  Social History  Substance Use Topics  . Smoking status: Former  Smoker    Quit date: 02/08/1996  . Smokeless tobacco: Not on file  . Alcohol use 9.0 oz/week    15 Standard drinks or equivalent per week   Review of Systems  Constitutional: Negative.   HENT: Negative for congestion, ear discharge, ear pain, hearing loss, nosebleeds, sore throat and tinnitus.   Eyes: Negative.   Respiratory: Negative.  Negative for stridor.   Cardiovascular: Negative.   Gastrointestinal: Negative.   Genitourinary: Negative.   Musculoskeletal: Negative.   Skin: Negative for itching and rash.  Neurological: Negative.  Negative for headaches.  Endo/Heme/Allergies: Negative.   Psychiatric/Behavioral: Negative.      Physical Exam: Estimated body mass index is 26.19 kg/m as calculated from the following:   Height as of this encounter: 5\' 5"  (1.651 m).   Weight as of this encounter: 157 lb 6.4 oz (71.4 kg). BP 124/72   Pulse 83   Temp 97.5 F (36.4 C)   Resp 14   Ht 5\' 5"  (1.651 m)   Wt 157 lb 6.4 oz (71.4 kg)   LMP 02/07/1998 (Approximate)   SpO2 96%   BMI 26.19 kg/m  General Appearance: Well nourished, in no apparent distress. Eyes: PERRLA, EOMs, conjunctiva no swelling or erythema, normal fundi and vessels. Sinuses: No Frontal/maxillary tenderness ENT/Mouth: Ext aud canals clear, normal light reflex with TMs without erythema, bulging.  Good dentition. No erythema, swelling, or exudate on post pharynx. Tonsils not swollen or erythematous. Hearing normal.  Neck: Supple, thyroid normal. No bruits Respiratory: Respiratory effort normal, BS equal bilaterally without rales, rhonchi, wheezing or stridor. Cardio: RRR without murmurs, rubs or gallops. Brisk peripheral pulses without edema.  Chest: symmetric, with normal excursions and percussion. Breasts: defer Abdomen: Soft, +BS. Non tender, no guarding, rebound, hernias, masses, or organomegaly. .  Lymphatics: Non tender without lymphadenopathy.  Genitourinary: defer Musculoskeletal: Full ROM all peripheral  extremities,5/5 strength, and normal gait. Skin: + dry scaly patches bilateral hands. Warm, dry without lesions, ecchymosis.  Neuro: Cranial nerves intact, reflexes equal bilaterally. Normal muscle tone, no cerebellar symptoms. Sensation intact.  Psych: Awake and oriented X 3, normal affect, Insight and Judgment appropriate.    [name] 3:17 PM

## 2015-11-17 LAB — URINALYSIS, ROUTINE W REFLEX MICROSCOPIC
BILIRUBIN URINE: NEGATIVE
GLUCOSE, UA: NEGATIVE
HGB URINE DIPSTICK: NEGATIVE
KETONES UR: NEGATIVE
Leukocytes, UA: NEGATIVE
NITRITE: NEGATIVE
PH: 5.5 (ref 5.0–8.0)
Protein, ur: NEGATIVE
SPECIFIC GRAVITY, URINE: 1.019 (ref 1.001–1.035)

## 2015-11-17 LAB — TSH: TSH: 1.4 mIU/L

## 2015-11-17 LAB — MICROALBUMIN / CREATININE URINE RATIO
Creatinine, Urine: 143 mg/dL (ref 20–320)
MICROALB UR: 0.3 mg/dL
Microalb Creat Ratio: 2 mcg/mg creat (ref <30)

## 2015-11-17 LAB — VITAMIN D 25 HYDROXY (VIT D DEFICIENCY, FRACTURES): Vit D, 25-Hydroxy: 51 ng/mL (ref 30–100)

## 2015-12-23 LAB — HM COLONOSCOPY

## 2015-12-30 DIAGNOSIS — Z1211 Encounter for screening for malignant neoplasm of colon: Secondary | ICD-10-CM | POA: Diagnosis not present

## 2016-01-05 ENCOUNTER — Other Ambulatory Visit: Payer: Self-pay | Admitting: Internal Medicine

## 2016-02-08 DIAGNOSIS — C50911 Malignant neoplasm of unspecified site of right female breast: Secondary | ICD-10-CM

## 2016-02-08 HISTORY — DX: Malignant neoplasm of unspecified site of right female breast: C50.911

## 2016-02-10 ENCOUNTER — Other Ambulatory Visit: Payer: Self-pay | Admitting: Gynecology

## 2016-02-10 ENCOUNTER — Other Ambulatory Visit: Payer: Self-pay | Admitting: Obstetrics and Gynecology

## 2016-02-10 DIAGNOSIS — Z1231 Encounter for screening mammogram for malignant neoplasm of breast: Secondary | ICD-10-CM

## 2016-02-17 DIAGNOSIS — H40013 Open angle with borderline findings, low risk, bilateral: Secondary | ICD-10-CM | POA: Diagnosis not present

## 2016-02-29 ENCOUNTER — Ambulatory Visit
Admission: RE | Admit: 2016-02-29 | Discharge: 2016-02-29 | Disposition: A | Discharge status: Home / Self Care | Payer: Self-pay | Source: Ambulatory Visit | Attending: Obstetrics and Gynecology | Admitting: Obstetrics and Gynecology

## 2016-02-29 DIAGNOSIS — Z1231 Encounter for screening mammogram for malignant neoplasm of breast: Secondary | ICD-10-CM | POA: Diagnosis not present

## 2016-03-02 ENCOUNTER — Other Ambulatory Visit: Payer: Self-pay | Admitting: Obstetrics and Gynecology

## 2016-03-02 DIAGNOSIS — R928 Other abnormal and inconclusive findings on diagnostic imaging of breast: Secondary | ICD-10-CM

## 2016-03-04 ENCOUNTER — Other Ambulatory Visit: Payer: Self-pay

## 2016-03-04 ENCOUNTER — Ambulatory Visit
Admission: RE | Admit: 2016-03-04 | Discharge: 2016-03-04 | Disposition: A | Discharge status: Home / Self Care | Payer: BLUE CROSS/BLUE SHIELD | Source: Ambulatory Visit | Attending: Obstetrics and Gynecology | Admitting: Obstetrics and Gynecology

## 2016-03-04 ENCOUNTER — Other Ambulatory Visit: Payer: Self-pay | Admitting: Obstetrics and Gynecology

## 2016-03-04 DIAGNOSIS — R928 Other abnormal and inconclusive findings on diagnostic imaging of breast: Secondary | ICD-10-CM

## 2016-03-04 DIAGNOSIS — N6311 Unspecified lump in the right breast, upper outer quadrant: Secondary | ICD-10-CM | POA: Diagnosis not present

## 2016-03-05 ENCOUNTER — Other Ambulatory Visit: Payer: Self-pay | Admitting: Internal Medicine

## 2016-03-07 ENCOUNTER — Other Ambulatory Visit: Payer: Self-pay | Admitting: Obstetrics and Gynecology

## 2016-03-07 DIAGNOSIS — R928 Other abnormal and inconclusive findings on diagnostic imaging of breast: Secondary | ICD-10-CM

## 2016-03-08 ENCOUNTER — Ambulatory Visit
Admission: RE | Admit: 2016-03-08 | Discharge: 2016-03-08 | Disposition: A | Discharge status: Home / Self Care | Payer: BLUE CROSS/BLUE SHIELD | Source: Ambulatory Visit | Attending: Obstetrics and Gynecology | Admitting: Obstetrics and Gynecology

## 2016-03-08 DIAGNOSIS — N6312 Unspecified lump in the right breast, upper inner quadrant: Secondary | ICD-10-CM | POA: Diagnosis not present

## 2016-03-08 DIAGNOSIS — R928 Other abnormal and inconclusive findings on diagnostic imaging of breast: Secondary | ICD-10-CM

## 2016-03-08 DIAGNOSIS — R590 Localized enlarged lymph nodes: Secondary | ICD-10-CM | POA: Diagnosis not present

## 2016-03-08 DIAGNOSIS — N6311 Unspecified lump in the right breast, upper outer quadrant: Secondary | ICD-10-CM | POA: Diagnosis not present

## 2016-03-08 DIAGNOSIS — C50811 Malignant neoplasm of overlapping sites of right female breast: Secondary | ICD-10-CM | POA: Diagnosis not present

## 2016-03-08 DIAGNOSIS — Z170 Estrogen receptor positive status [ER+]: Secondary | ICD-10-CM | POA: Diagnosis not present

## 2016-03-11 ENCOUNTER — Telehealth: Payer: Self-pay | Admitting: *Deleted

## 2016-03-11 NOTE — Telephone Encounter (Signed)
Confirmed [facility] for 03/16/16 at 0815 .  Instructions and contact information given.

## 2016-03-14 NOTE — Progress Notes (Signed)
[place]  [phone] Fax:([phone]) [place] Note   Patient Care Team: [name], MD as PCP - General (Internal Medicine) Renella Cunas, MD (Inactive) as Consulting Physician (Cardiology) [name], MD as Consulting Physician (Dermatology) 03/16/2016   CHIEF COMPLAINTS/PURPOSE OF CONSULTATION:  Newly diagnosed right breast cancer  Oncology History   Cancer Staging 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 breast in female, estrogen receptor positive ([place]) Staging form: Breast, AJCC 8th Edition - Clinical stage from 03/08/2016: Stage IA (cT1c, cN0, cM0, G2, ER: Positive, PR: Negative, HER2: Negative) - Signed by Truitt Merle, MD on 03/15/2016       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 breast in female, estrogen receptor positive ([place])   03/04/2016 Mammogram    Diagnostic bilateral mammogram and right breast ultrasound showed a 1.7 cm mass in the 12:00 position of the right breast, highly suspicious for malignancy. There is a borderline abnormal right axillary lymph node, also suspicious for metastasis.       03/08/2016 Initial Diagnosis    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 breast in female, estrogen receptor positive ([place])      03/08/2016 Initial Biopsy    Right breast 12:00 core needle biopsy showed invasive ductal carcinoma, grade 2, right axillary lymph node biopsy was negative.      03/08/2016 Receptors her2    ER 20% positive, PR negative, HER-2 negative, Ki-67 20%       HISTORY OF PRESENTING ILLNESS:  Chelsea Salinas 61 y.o. female is here because of recently diagnosed right breast invasive mammary carcinoma. She is accompanied by her husband to our multidisciplinary breast clinic today.  The patient underwent routine screening mammogram on 02/29/2016 and was found to have possible architectural distortion and asymmetry in the right breast. Also found was asymmetry in the left  breast.  Accordingly a bilateral diagnostic mammogram and unilateral right breast ultrasound were performed on 03/04/2016. These scans showed a 1.7 cm mass in the 12 o'clock position of the right breast with imaging features highly suspicious for malignancy. Also seen was borderline abnormal right axillary lymph node, suspicious for a metastatic node.  Biopsy of the right breast 12:00 o'clock position on 03/08/2016 revealed invasive mammary carcinoma. The carcinoma appears to be at least grade 2, ER positive, PR negative, HER-2 negative, Ki67 20%. The malignant cells are positive for E-Cadherin, supporting a ductal phenotype. Biopsy of the right axillary lymph node showed no evidence of carcinoma.   She feels well overall, denies any significant pain or other symptoms.  GYN HISTORY  Menarchal:12 LMP: late 44's (hot flushes since mid 39's)  Contraceptive: 23 HRT: 3-4 years  G0P0:   MEDICAL HISTORY:  Past Medical History:  Diagnosis Date  . Abnormal Pap smear of cervix    --in her 20s had colpo and some type of treatment to cervix.  . Allergy   . Asthma   . Breast cancer ([place])   . Eczema   . Family history of breast cancer   . Family history of genetic disease carrier    paternal relatives with a BRCA mutation  . Family history of ovarian cancer   . Hypertension   . STD (sexually transmitted disease)    HSV II    SURGICAL HISTORY: Past Surgical History:  Procedure Laterality Date  . DILATION AND CURETTAGE OF UTERUS  2008    SOCIAL HISTORY: Social History   Social History  . Marital status: Married  Spouse name: N/A  . Number of children: N/A  . Years of education: N/A   Occupational History  . Not on file.   Social History Main Topics  . Smoking status: Former Smoker    Packs/day: 1.00    Years: 25.00    Quit date: 02/08/1996  . Smokeless tobacco: Never Used  . Alcohol use 10.8 - 11.4 oz/week    3 - 4 Glasses of wine, 15 Standard drinks or equivalent per week      Comment: daily drinker for over 30 years   . Drug use: No  . Sexual activity: No   Other Topics Concern  . Not on file   Social History Narrative  . No narrative on file    FAMILY HISTORY: Family History  Problem Relation Age of Onset  . Polymyalgia rheumatica Mother   . Hypertension Father   . Heart disease Father     Dec 69  . Heart attack Father   . Prostate cancer Father     Dx 48s; deceased 58  . Breast cancer Cousin     Dx 49s; daughter of a paternal uncle  . Ovarian cancer Paternal Aunt     Dx 62; deceased  . Ovarian cancer Cousin     Dx 29s; daughter of a paternal uncle  . Ovarian cancer Cousin     Dx 37; daughter of a paternal uncle  . Ovarian cancer Paternal Aunt     Dx 71s.  BRCA positive  . Ovarian cancer Other     pat grandfather's mother    ALLERGIES:  has No Known Allergies.  MEDICATIONS:  Current Outpatient Prescriptions  Medication Sig Dispense Refill  . aspirin 81 MG tablet Take 81 mg by mouth daily.    . Cetirizine HCl (ZYRTEC PO) Take by mouth.    . Cholecalciferol (VITAMIN D-3) 1000 UNITS CAPS Take by mouth 2 (two) times daily.    . cloNIDine (CATAPRES) 0.2 MG tablet TAKE 1 TABLET(0.2 MG) BY MOUTH AT BEDTIME 90 tablet 1  . escitalopram (LEXAPRO) 20 MG tablet Take 1 tablet (20 mg total) by mouth daily. 90 tablet 3  . Flaxseed, Linseed, (FLAX SEEDS PO) Take by mouth daily.    Marland Kitchen losartan (COZAAR) 100 MG tablet Take 1 tablet (100 mg total) by mouth daily. 30 tablet 11  . Magnesium 250 MG TABS Take by mouth every other day.    . montelukast (SINGULAIR) 10 MG tablet TAKE 1 TABLET(10 MG) BY MOUTH DAILY (Patient taking differently: take 1 tab as needed.) 90 tablet 0  . Multiple Vitamins-Minerals (MULTIVITAMIN WITH MINERALS) tablet Take 1 tablet by mouth daily.    . Omega-3 Fatty Acids (FISH OIL) 1000 MG CAPS Take 1,200 mg by mouth daily.     Marland Kitchen triamcinolone cream (KENALOG) 0.1 % Apply 1 application topically 2 (two) times daily. (Patient taking  differently: Apply 1 application topically as needed. ) 454 g 0  . venlafaxine XR (EFFEXOR-XR) 75 MG 24 hr capsule TAKE 2 CAPSULES BY MOUTH EVERY MORNING AND 1 CAPSULE EVERY EVENING 270 capsule 0  . vitamin B-12 (CYANOCOBALAMIN) 100 MCG tablet Take 100 mcg by mouth every other day.     . valACYclovir (VALTREX) 500 MG tablet Take 1 tablet (500 mg total) by mouth 2 (two) times daily. Take for 3 days as needed. (Patient not taking: Reported on 03/16/2016) 30 tablet 1   No current facility-administered medications for this visit.     REVIEW OF SYSTEMS:   Constitutional: Denies  fevers, chills or abnormal night sweats Eyes: Denies blurriness of vision, double vision or watery eyes Ears, nose, mouth, throat, and face: Denies mucositis or sore throat Respiratory: Denies cough, dyspnea or wheezes Cardiovascular: Denies palpitation, chest discomfort or lower extremity swelling Gastrointestinal:  Denies nausea, heartburn or change in bowel habits Skin: Denies abnormal skin rashes Lymphatics: Denies new lymphadenopathy or easy bruising Neurological:Denies numbness, tingling or new weaknesses Behavioral/Psych: Mood is stable, no new changes  All other systems were reviewed with the patient and are negative.  PHYSICAL EXAMINATION: ECOG PERFORMANCE STATUS: 0 - Asymptomatic  Vitals:   03/16/16 0842  BP: 125/72  Pulse: 85  Resp: 18  Temp: 97.5 F (36.4 C)   Filed Weights   03/16/16 0842  Weight: 155 lb 11.2 oz (70.6 kg)    GENERAL:alert, no distress and comfortable SKIN: skin color, texture, turgor are normal, no rashes or significant lesions EYES: normal, conjunctiva are pink and non-injected, sclera clear OROPHARYNX:no exudate, no erythema and lips, buccal mucosa, and tongue normal  NECK: supple, thyroid normal size, non-tender, without nodularity LYMPH:  no palpable lymphadenopathy in the cervical, axillary or inguinal LUNGS: clear to auscultation and percussion with normal breathing  effort HEART: regular rate & rhythm and no murmurs and no lower extremity edema ABDOMEN:abdomen soft, non-tender and normal bowel sounds Musculoskeletal:no cyanosis of digits and no clubbing  PSYCH: alert & oriented x 3 with fluent speech NEURO: no focal motor/sensory deficits Breasts: Breast inspection showed them to be symmetrical with no nipple discharge. Palpation of the right breast showed a 2.0X1.5cm mass at 12:00 position, Palpitation of the left breast and axillas revealed no obvious mass that I could appreciate.   LABORATORY DATA:  I have reviewed the data as listed CBC Latest Ref Rng & Units 03/16/2016 11/16/2015 05/18/2015  WBC 3.9 - 10.3 10e3/uL 5.6 5.1 4.8  Hemoglobin 11.6 - 15.9 g/dL 13.4 12.4 13.9  Hematocrit 34.8 - 46.6 % 40.8 37.8 42.1  Platelets 145 - 400 10e3/uL 236 217 236    CMP Latest Ref Rng & Units 03/16/2016 11/16/2015 05/18/2015  Glucose 70 - 140 mg/dl 99 89 93  BUN 7.0 - 26.0 mg/dL 15._0 Creatinine 0.6 - 1.1 mg/dL 0.7 0.80 0.88  Sodium 136 - 145 mEq/L 141 138 139  Potassium 3.5 - 5.1 mEq/L 4.2 4.1 5.0  Chloride 98 - 110 mmol/L - 101 99  CO2 22 - 29 mEq/L 29 28 32(H)  Calcium 8.4 - 10.4 mg/dL 10.0 9.4 10.1  Total Protein 6.4 - 8.3 g/dL 7.3 6.8 7.1  Total Bilirubin 0.20 - 1.20 mg/dL 0.45 0.3 0.5  Alkaline Phos 40 - 150 U/L 85 75 83  AST 5 - 34 U/L _1 ALT 0 - 55 U/L _2 PATHOLOGY  Diagnosis 03/08/2016 1. Breast, right, needle core biopsy, 12:00 o'clock - INVASIVE MAMMARY CARCINOMA. - SEE COMMENT. 2. Lymph node, needle/core biopsy, right axilla - THERE IS NO EVIDENCE OF CARCINOMA IN 1 OF 1 LYMPH NODE (0/1). Microscopic Comment 1. The carcinoma appears at least grade 2. An E-cadherin stain will be performed and the results reported separately. A breast prognostic profile will be performed and the results reported separately. The results were called to [facility] on 03/09/2016. PROGNOSTIC INDICATORS Estrogen Receptor:  20%, POSITIVE, WEAK STAINING INTENSITY Progesterone Receptor: 0%, NEGATIVE Proliferation Marker Ki67: 20% HER2 - NEGATIVE 1. The malignant cells are positive for E-Cadherin, supporting a ductal phenotype. (JBK:kh 03/09/16)  RADIOGRAPHIC STUDIES: I have personally reviewed the radiological images as listed and agreed with the findings in the report. Mm Digital Screening Bilateral  Result Date: 02/29/2016 CLINICAL DATA:  Screening. EXAM: DIGITAL SCREENING BILATERAL MAMMOGRAM WITH CAD COMPARISON:  Previous exam(s). ACR Breast Density Category c: The breast tissue is heterogeneously dense, which may obscure small masses. FINDINGS: In the right breast there is possible architectural distortion and possible asymmetry require further evaluation. In the left breast possible asymmetry requires further evaluation. Images were processed with CAD. IMPRESSION: Further evaluation is suggested for possible architectural distortion and asymmetry in the right breast. Further evaluation is suggested for possible asymmetry in the left breast. RECOMMENDATION: Diagnostic mammogram and possibly ultrasound of both breasts. (Code:[id number]-B-[id number]) The patient will be contacted regarding the findings, and additional imaging will be scheduled. BI-RADS CATEGORY  0: Incomplete. Need additional imaging evaluation and/or prior mammograms for comparison. Electronically Signed   By: Abelardo Diesel M.D.   On: 03/01/2016 08:36   US Breast Ltd Uni Right Inc Axilla  Result Date: 03/04/2016 CLINICAL DATA:  Possible architectural distortion in the 12 o'clock position of the right breast and possible asymmetries in the outer portions of both breasts on a recent screening mammogram. EXAM: 2D DIGITAL DIAGNOSTIC BILATERAL MAMMOGRAM WITH CAD AND ADJUNCT TOMO ULTRASOUND RIGHT BREAST COMPARISON:  Previous exam(s). ACR Breast Density Category c: The breast tissue is heterogeneously dense, which may obscure small masses. FINDINGS: 2D and 3D tomographic  images of the right breast demonstrate an irregular, spiculated mass in the 12 o'clock position of the breast. Normal appearing breast tissue is demonstrated in the outer portion of each breast at the locations of recently suspected asymmetry. Mammographic images were processed with CAD. On physical exam, there is an approximately 2 cm rounded area of palpable soft tissue thickening in the 12 o'clock position of the right breast, 3 cm from the nipple. There are no palpable right axillary lymph nodes. Targeted ultrasound is performed, showing a 1.7 x 1.4 x 1.3 cm vertically oriented, irregular, hypoechoic mass with posterior acoustical shadowing in the 12 o'clock position of the right breast, 3 cm from the nipple. There is ill-defined increased echogenicity in the surrounding tissues. Ultrasound of the right axilla demonstrated multiple normal appearing right axillary lymph nodes. There was one lymph node with borderline cortical thickening, measuring 2.9 mm in maximum thickness. The cortex in this lymph node is thicker than the cortex in the remainder the lymph nodes. IMPRESSION: 1. 1.7 cm mass in the 12 o'clock position of the right breast with imaging features highly suspicious for malignancy. 2. Borderline abnormal right axillary lymph node, suspicious for a metastatic node. RECOMMENDATION: Ultrasound guided core needle biopsy of the 1.7 cm mass in the 12 o'clock position of the right breast and ultrasound-guided core needle biopsy of the borderline abnormal right axillary lymph node. This has been discussed with the patient and scheduled at 7:30 a.m. on 03/08/2016. I have discussed the findings and recommendations with the patient. Results were also provided in writing at the conclusion of the visit. If applicable, a reminder letter will be sent to the patient regarding the next appointment. BI-RADS CATEGORY  5: Highly suggestive of malignancy. Electronically Signed   By: Claudie Revering M.D.   On: 03/04/2016 16:17    Mm Diag Breast Tomo Bilateral  Result Date: 03/04/2016 CLINICAL DATA:  Possible architectural distortion in the 12 o'clock position of the right breast and possible asymmetries in the outer portions of both breasts on a recent screening  mammogram. EXAM: 2D DIGITAL DIAGNOSTIC BILATERAL MAMMOGRAM WITH CAD AND ADJUNCT TOMO ULTRASOUND RIGHT BREAST COMPARISON:  Previous exam(s). ACR Breast Density Category c: The breast tissue is heterogeneously dense, which may obscure small masses. FINDINGS: 2D and 3D tomographic images of the right breast demonstrate an irregular, spiculated mass in the 12 o'clock position of the breast. Normal appearing breast tissue is demonstrated in the outer portion of each breast at the locations of recently suspected asymmetry. Mammographic images were processed with CAD. On physical exam, there is an approximately 2 cm rounded area of palpable soft tissue thickening in the 12 o'clock position of the right breast, 3 cm from the nipple. There are no palpable right axillary lymph nodes. Targeted ultrasound is performed, showing a 1.7 x 1.4 x 1.3 cm vertically oriented, irregular, hypoechoic mass with posterior acoustical shadowing in the 12 o'clock position of the right breast, 3 cm from the nipple. There is ill-defined increased echogenicity in the surrounding tissues. Ultrasound of the right axilla demonstrated multiple normal appearing right axillary lymph nodes. There was one lymph node with borderline cortical thickening, measuring 2.9 mm in maximum thickness. The cortex in this lymph node is thicker than the cortex in the remainder the lymph nodes. IMPRESSION: 1. 1.7 cm mass in the 12 o'clock position of the right breast with imaging features highly suspicious for malignancy. 2. Borderline abnormal right axillary lymph node, suspicious for a metastatic node. RECOMMENDATION: Ultrasound guided core needle biopsy of the 1.7 cm mass in the 12 o'clock position of the right breast and  ultrasound-guided core needle biopsy of the borderline abnormal right axillary lymph node. This has been discussed with the patient and scheduled at 7:30 a.m. on 03/08/2016. I have discussed the findings and recommendations with the patient. Results were also provided in writing at the conclusion of the visit. If applicable, a reminder letter will be sent to the patient regarding the next appointment. BI-RADS CATEGORY  5: Highly suggestive of malignancy. Electronically Signed   By: Claudie Revering M.D.   On: 03/04/2016 16:17   Mm Clip Placement Right  Result Date: 03/08/2016 CLINICAL DATA:  Status post ultrasound-guided core needle biopsy of a 1.7 cm mass in the 12 o'clock position of the right breast and a borderline abnormal right axillary lymph node. EXAM: DIAGNOSTIC RIGHT MAMMOGRAM POST ULTRASOUND BIOPSY COMPARISON:  Previous exam(s). FINDINGS: Mammographic images were obtained following ultrasound guided biopsy of the 1.7 cm mass in the 12 o'clock position of the right breast and a borderline abnormal right axillary lymph node. These demonstrate a ribbon shaped biopsy marker clip in the anterior aspect of the biopsied mass in the 12 o'clock position of the breast. The HydroMARK clip in the right axilla is not visible on the images. This is most likely due to the deep position of the biopsied lymph node in the axilla. IMPRESSION: Appropriate deployment of the ribbon shaped biopsy marker clip in the anterior aspect of the biopsied mass in the 12 o'clock position of the right breast. Final Assessment: Post Procedure Mammograms for Marker Placement Electronically Signed   By: Claudie Revering M.D.   On: 03/08/2016 08:59   [place] Rt Breast Bx W Loc Dev 1st Lesion Img Bx Spec US Guide  Result Date: 03/08/2016 CLINICAL DATA:  1.7 cm highly suspicious mass in the 12 o'clock position of the right breast at recent mammography and ultrasound. EXAM: ULTRASOUND GUIDED RIGHT BREAST CORE NEEDLE BIOPSY COMPARISON:  Previous  exam(s). FINDINGS: I met with the patient and we  discussed the procedure of ultrasound-guided biopsy, including benefits and alternatives. We discussed the high likelihood of a successful procedure. We discussed the risks of the procedure, including infection, bleeding, tissue injury, clip migration, and inadequate sampling. Informed written consent was given. The usual time-out protocol was performed immediately prior to the procedure. Using sterile technique and 1% Lidocaine as local anesthetic, under direct ultrasound visualization, a 12 gauge spring-loaded device was used to perform biopsy of the recently demonstrated 1.7 cm spiculated mass in the 12 o'clock position of the right breast using a inferolateral approach. At the conclusion of the procedure a ribbon shaped tissue marker clip was deployed into the biopsy cavity. Follow up 2 view mammogram was performed and dictated separately. IMPRESSION: Ultrasound guided biopsy of a 1.7 cm spiculated mass in the 12 o'clock position of the right breast. No apparent complications. Electronically Signed   By: Claudie Revering M.D.   On: 03/08/2016 08:42   [place] Rt Breast Bx W Loc Dev Ea Add Lesion Img Bx Spec US Guide  Result Date: 03/08/2016 CLINICAL DATA:  Borderline abnormal right axillary lymph node at recent ultrasound. Right breast mass in the 12 o'clock position, highly suspicious for malignancy at recent imaging. EXAM: ULTRASOUND GUIDED CORE NEEDLE BIOPSY OF A RIGHT AXILLARY NODE COMPARISON:  Previous exam(s). FINDINGS: I met with the patient and we discussed the procedure of ultrasound-guided biopsy, including benefits and alternatives. We discussed the high likelihood of a successful procedure. We discussed the risks of the procedure, including infection, bleeding, tissue injury, clip migration, and inadequate sampling. Informed written consent was given. The usual time-out protocol was performed immediately prior to the procedure. Using sterile technique and 1%  Lidocaine as local anesthetic, under direct ultrasound visualization, a 14 gauge spring-loaded device was used to perform biopsy of the recently demonstrated borderline abnormal right axillary lymph node using a inferior approach. At the conclusion of the procedure a HydroMARK tissue marker clip was deployed into the biopsy cavity. Follow up 2 view mammogram was performed and dictated separately. IMPRESSION: Ultrasound guided biopsy of a borderline abnormal right axillary lymph node. No apparent complications. Electronically Signed   By: Claudie Revering M.D.   On: 03/08/2016 08:43    ASSESSMENT & PLAN:  61 year old postmenopausal woman, presented with screening discovered right breast cancer  1. 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  ---We discussed her imaging findings and the biopsy results in great details. -Giving the early stage disease, she likely need a candidate for breast conservation surgery. She is agreeable with that. She was seen by [name] today and likely will proceed with right breast lumpectomy and sentinel lymph node biopsy surgery soon.  -Due to her strong family history of ovarian cancer and breast cancer, we'll refer her to see genetic counselor tomorrow. Patient is agreeable to have bilateral mastectomy if it's recommended based on her genetic test results. -I recommend a Oncotype Dx test on the surgical sample and we'll make a decision about adjuvant chemotherapy based on the Oncotype result. Written material of this test was given to her. She is young and fit, would be a good candidate for chemotherapy if her Oncotype recurrence score is high. -If her surgical sentinel lymph node node positive, I recommend mammaprint for further risk stratification and guide adjuvant chemotherapy. -Giving the strong ER and PR expression in her postmenopausal status, I recommend adjuvant endocrine  therapy with aromatase inhibitor for a  total of 5-10 years to reduce the risk of cancer recurrence. Potential benefits and side effects were discussed with patient and she is interested. --The potential benefit and side effects of AI, which includes but not limited to, hot flash, skin and vaginal dryness, metabolic changes ( increased blood glucose, cholesterol, weight, etc.), slightly in increased risk of cardiovascular disease, cataracts, muscular and joint discomfort, osteopenia and osteoporosis, etc, were discussed with her in great details. She is interested, and we'll start after she completes radiation. -She was also seen by radiation oncologist Dr. Lisbeth Renshaw today. She would benefit from adjuvant breast radiation after lumpectomy. -We also discussed the breast cancer surveillance after her surgery. She will continue annual screening mammogram, self exam, and a routine office visit with lab and exam with [place]. -I encouraged her to have healthy diet and exercise regularly.   2. Genetics -Due to her strong family history of ovarian cancer and also positive family history of breast cancer, we recommend her to have genetic testing done as soon as possible, which may impact her choice of breast surgery. -She is scheduled to see genetic counseling tomorrow.  Plan: -Genetic counseling and test tomorrow -She will make a decision of her breast surgery based on her genetic test results -She'll proceed with breast surgery afterwards -Oncotype if node negative on surgical path, or Mammaprint if node positive  -I'll plan to see her back after she completes breast radiation, to finalize her adjuvant aromatase inhibitor, or sooner if needed (high risk on Oncotype or mammaprint)  No orders of the defined types were placed in this encounter.   All questions were answered. The patient knows to call the clinic with any problems, questions or concerns. I spent 55 minutes counseling the patient face to face. The  total time spent in the appointment was 60 minutes and more than 50% was on counseling.   Truitt Merle, MD 03/16/2016

## 2016-03-15 ENCOUNTER — Other Ambulatory Visit: Payer: Self-pay | Admitting: *Deleted

## 2016-03-15 DIAGNOSIS — C50411 Malignant neoplasm of upper-outer quadrant of right female breast: Secondary | ICD-10-CM

## 2016-03-15 DIAGNOSIS — Z170 Estrogen receptor positive status [ER+]: Principal | ICD-10-CM

## 2016-03-15 DIAGNOSIS — C50111 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 female breast: Secondary | ICD-10-CM | POA: Insufficient documentation

## 2016-03-15 DIAGNOSIS — Z171 Estrogen receptor negative status [ER-]: Secondary | ICD-10-CM

## 2016-03-16 ENCOUNTER — Ambulatory Visit: Payer: BLUE CROSS/BLUE SHIELD | Attending: General Surgery | Admitting: Physical Therapy

## 2016-03-16 ENCOUNTER — Encounter: Payer: Self-pay | Admitting: *Deleted

## 2016-03-16 ENCOUNTER — Ambulatory Visit
Admission: RE | Admit: 2016-03-16 | Discharge: 2016-03-16 | Disposition: A | Discharge status: Home / Self Care | Payer: BLUE CROSS/BLUE SHIELD | Source: Ambulatory Visit | Attending: Radiation Oncology | Admitting: Radiation Oncology

## 2016-03-16 ENCOUNTER — Other Ambulatory Visit (HOSPITAL_BASED_OUTPATIENT_CLINIC_OR_DEPARTMENT_OTHER): Payer: BLUE CROSS/BLUE SHIELD

## 2016-03-16 ENCOUNTER — Encounter: Payer: Self-pay | Admitting: Hematology

## 2016-03-16 ENCOUNTER — Ambulatory Visit (HOSPITAL_BASED_OUTPATIENT_CLINIC_OR_DEPARTMENT_OTHER): Payer: BLUE CROSS/BLUE SHIELD | Admitting: Hematology

## 2016-03-16 ENCOUNTER — Other Ambulatory Visit: Payer: Self-pay | Admitting: General Surgery

## 2016-03-16 ENCOUNTER — Encounter: Payer: Self-pay | Admitting: Physical Therapy

## 2016-03-16 DIAGNOSIS — Z171 Estrogen receptor negative status [ER-]: Secondary | ICD-10-CM

## 2016-03-16 DIAGNOSIS — C50111 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 female breast: Secondary | ICD-10-CM

## 2016-03-16 DIAGNOSIS — Z170 Estrogen receptor positive status [ER+]: Secondary | ICD-10-CM

## 2016-03-16 DIAGNOSIS — C50411 Malignant neoplasm of upper-outer quadrant of right female breast: Secondary | ICD-10-CM | POA: Diagnosis not present

## 2016-03-16 DIAGNOSIS — Z853 Personal history of malignant neoplasm of breast: Secondary | ICD-10-CM | POA: Diagnosis not present

## 2016-03-16 DIAGNOSIS — Z803 Family history of malignant neoplasm of breast: Secondary | ICD-10-CM

## 2016-03-16 DIAGNOSIS — C50811 Malignant neoplasm of overlapping sites of right female breast: Secondary | ICD-10-CM | POA: Diagnosis not present

## 2016-03-16 DIAGNOSIS — Z8041 Family history of malignant neoplasm of ovary: Secondary | ICD-10-CM

## 2016-03-16 DIAGNOSIS — Z1501 Genetic susceptibility to malignant neoplasm of breast: Secondary | ICD-10-CM | POA: Diagnosis not present

## 2016-03-16 LAB — COMPREHENSIVE METABOLIC PANEL
ALT: 19 U/L (ref 0–55)
ANION GAP: 9 meq/L (ref 3–11)
AST: 23 U/L (ref 5–34)
Albumin: 4.2 g/dL (ref 3.5–5.0)
Alkaline Phosphatase: 85 U/L (ref 40–150)
BILIRUBIN TOTAL: 0.45 mg/dL (ref 0.20–1.20)
BUN: 15.7 mg/dL (ref 7.0–26.0)
CALCIUM: 10 mg/dL (ref 8.4–10.4)
CHLORIDE: 103 meq/L (ref 98–109)
CO2: 29 mEq/L (ref 22–29)
CREATININE: 0.7 mg/dL (ref 0.6–1.1)
EGFR: 87 mL/min/{1.73_m2} — AB (ref >90)
Glucose: 99 mg/dl (ref 70–140)
Potassium: 4.2 mEq/L (ref 3.5–5.1)
Sodium: 141 mEq/L (ref 136–145)
TOTAL PROTEIN: 7.3 g/dL (ref 6.4–8.3)

## 2016-03-16 LAB — CBC WITH DIFFERENTIAL/PLATELET
BASO%: 0.5 % (ref 0.0–2.0)
BASOS ABS: 0 10*3/uL (ref 0.0–0.1)
EOS%: 1.6 % (ref 0.0–7.0)
Eosinophils Absolute: 0.1 10*3/uL (ref 0.0–0.5)
HEMATOCRIT: 40.8 % (ref 34.8–46.6)
HGB: 13.4 g/dL (ref 11.6–15.9)
LYMPH#: 2.3 10*3/uL (ref 0.9–3.3)
LYMPH%: 40 % (ref 14.0–49.7)
MCH: 32.7 pg (ref 25.1–34.0)
MCHC: 32.8 g/dL (ref 31.5–36.0)
MCV: 99.5 fL (ref 79.5–101.0)
MONO#: 0.6 10*3/uL (ref 0.1–0.9)
MONO%: 9.8 % (ref 0.0–14.0)
NEUT#: 2.7 10*3/uL (ref 1.5–6.5)
NEUT%: 48.1 % (ref 38.4–76.8)
PLATELETS: 236 10*3/uL (ref 145–400)
RBC: 4.1 10*6/uL (ref 3.70–5.45)
RDW: 13.2 % (ref 11.2–14.5)
WBC: 5.6 10*3/uL (ref 3.9–10.3)

## 2016-03-16 NOTE — Patient Instructions (Signed)

## 2016-03-16 NOTE — Progress Notes (Signed)
Radiation Oncology         [phone] ________________________________  Name: Chelsea Salinas MRN: [id number]  Date: 03/16/2016  DOB: 1956/01/22  XM:IWOEHOZ,YYQMGNO DAVID, MD  [name], MD     REFERRING PHYSICIAN: [name], MD   DIAGNOSIS: The encounter diagnosis was 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 breast in female, estrogen receptor positive ([place]).   HISTORY OF PRESENT ILLNESS: Chelsea Salinas is a 61 y.o. female seen in the multidisciplinary breast clinic for a new diagnosis of right breast cancer. She was found to have a distortion on screening mammogram. Diagnostic ultrasound revealed a 1.7 x 1.4 x 1.3 mass at 12 o'clock. The axillary node had increased cortical thickening. 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 Her axillary node was negative. She comes today to discuss the recommendations of care for her cancer.    PREVIOUS RADIATION THERAPY: No   PAST MEDICAL HISTORY:  Past Medical History:  Diagnosis Date  . Abnormal Pap smear of cervix    --in her 20s had colpo and some type of treatment to cervix.  . Allergy   . Asthma   . Eczema   . Hypertension   . STD (sexually transmitted disease)    HSV II       PAST SURGICAL HISTORY: Past Surgical History:  Procedure Laterality Date  . DILATION AND CURETTAGE OF UTERUS  2008     FAMILY HISTORY:  Family History  Problem Relation Age of Onset  . Polymyalgia rheumatica Mother   . Hypertension Father   . Heart disease Father     Dec 69  . Heart attack Father   . Breast cancer Cousin   . Ovarian cancer Cousin   . Cancer Paternal Aunt     ovarian/cervical  . Ovarian cancer Paternal Aunt   . Ovarian cancer Sister   . Ovarian cancer Sister      SOCIAL HISTORY:  reports that she quit smoking about 20 years ago. She does not have any smokeless tobacco history on file. She reports that she  drinks about 9.0 oz of alcohol per week . She reports that she does not use drugs.   ALLERGIES: Patient has no known allergies.   MEDICATIONS:  Current Outpatient Prescriptions  Medication Sig Dispense Refill  . aspirin 81 MG tablet Take 81 mg by mouth daily.    . Cetirizine HCl (ZYRTEC PO) Take by mouth.    . Cholecalciferol (VITAMIN D-3) 1000 UNITS CAPS Take by mouth 2 (two) times daily.    . cloNIDine (CATAPRES) 0.2 MG tablet TAKE 1 TABLET(0.2 MG) BY MOUTH AT BEDTIME 90 tablet 1  . escitalopram (LEXAPRO) 20 MG tablet Take 1 tablet (20 mg total) by mouth daily. 90 tablet 3  . Flaxseed, Linseed, (FLAX SEEDS PO) Take by mouth daily.    Marland Kitchen losartan (COZAAR) 100 MG tablet Take 1 tablet (100 mg total) by mouth daily. 30 tablet 11  . Magnesium 250 MG TABS Take by mouth every other day.    . montelukast (SINGULAIR) 10 MG tablet TAKE 1 TABLET(10 MG) BY MOUTH DAILY (Patient taking differently: take 1 tab as needed.) 90 tablet 0  . Multiple Vitamins-Minerals (MULTIVITAMIN WITH MINERALS) tablet Take 1 tablet by mouth daily.    . Omega-3 Fatty Acids (FISH OIL) 1000 MG CAPS Take 1,200 mg by mouth daily.     Marland Kitchen triamcinolone cream (KENALOG) 0.1 %  Apply 1 application topically 2 (two) times daily. (Patient taking differently: Apply 1 application topically as needed. ) 454 g 0  . valACYclovir (VALTREX) 500 MG tablet Take 1 tablet (500 mg total) by mouth 2 (two) times daily. Take for 3 days as needed. (Patient not taking: Reported on 03/16/2016) 30 tablet 1  . venlafaxine XR (EFFEXOR-XR) 75 MG 24 hr capsule TAKE 2 CAPSULES BY MOUTH EVERY MORNING AND 1 CAPSULE EVERY EVENING 270 capsule 0  . vitamin B-12 (CYANOCOBALAMIN) 100 MCG tablet Take 100 mcg by mouth every other day.      No current facility-administered medications for this encounter.      REVIEW OF SYSTEMS: On review of systems, the patient reports that she is doing well overall. She denies any chest pain, shortness of breath, cough, fevers, chills,  night sweats, unintended weight changes. She denies any bowel or bladder disturbances, and denies abdominal pain, nausea or vomiting. She denies any new musculoskeletal or joint aches or pains. A complete review of systems is obtained and is otherwise negative.     PHYSICAL EXAM:  Wt Readings from Last 3 Encounters:  03/16/16 155 lb 11.2 oz (70.6 kg)  11/16/15 157 lb 6.4 oz (71.4 kg)  05/18/15 155 lb (70.3 kg)   Temp Readings from Last 3 Encounters:  03/16/16 97.5 F (36.4 C) (Oral)  11/16/15 97.5 F (36.4 C)  05/18/15 97.5 F (36.4 C) (Temporal)   BP Readings from Last 3 Encounters:  03/16/16 125/72  11/16/15 124/72  05/18/15 130/86   Pulse Readings from Last 3 Encounters:  03/16/16 85  11/16/15 83  05/18/15 82   In general this is a well appearing Caucasian female in no acute distress. She is alert and oriented x4 and appropriate throughout the examination. HEENT reveals that the patient is normocephalic, atraumatic. EOMs are intact. PERRLA. Skin is intact without any evidence of gross lesions. Cardiovascular exam reveals a regular rate and rhythm, no clicks rubs or murmurs are auscultated. Chest is clear to auscultation bilaterally. Lymphatic assessment is performed and does not reveal any adenopathy in the cervical, supraclavicular, axillary, or inguinal chains. Bilateral breast exam reveals nipple retraction on the right which she states has been that case forever. She has ecchymoses consistent with her biopsy sites. There is induration at the 12 o'clock site. no other palpable masses are noted of either breast. No nipple discharge is noted bilaterally.  Abdomen has active bowel sounds in all quadrants and is intact. The abdomen is soft, non tender, non distended. Lower extremities are negative for pretibial pitting edema, deep calf tenderness, cyanosis or clubbing.   ECOG = 0  0 - Asymptomatic (Fully active, able to carry on all predisease activities without restriction)  1 -  Symptomatic but completely ambulatory (Restricted in physically strenuous activity but ambulatory and able to carry out work of a light or sedentary nature. For example, light housework, office work)  2 - Symptomatic, <50% in bed during the day (Ambulatory and capable of all self care but unable to carry out any work activities. Up and about more than 50% of waking hours)  3 - Symptomatic, >50% in bed, but not bedbound (Capable of only limited self-care, confined to bed or chair 50% or more of waking hours)  4 - Bedbound (Completely disabled. Cannot carry on any self-care. Totally confined to bed or chair)  5 - Death   Eustace Pen MM, Creech RH, Tormey DC, et al. [phone]). "Toxicity and response criteria of the [facility]  Group". [place] Oncol. 5 (6): 649-55    LABORATORY DATA:  Lab Results  Component Value Date   WBC 5.6 03/16/2016   HGB 13.4 03/16/2016   HCT 40.8 03/16/2016   MCV 99.5 03/16/2016   PLT 236 03/16/2016   Lab Results  Component Value Date   NA 141 03/16/2016   K 4.2 03/16/2016   CL 101 11/16/2015   CO2 29 03/16/2016   Lab Results  Component Value Date   ALT 19 03/16/2016   AST 23 03/16/2016   ALKPHOS 85 03/16/2016   BILITOT 0.45 03/16/2016      RADIOGRAPHY: Mm Digital Screening Bilateral  Result Date: 02/29/2016 CLINICAL DATA:  Screening. EXAM: DIGITAL SCREENING BILATERAL MAMMOGRAM WITH CAD COMPARISON:  Previous exam(s). ACR Breast Density Category c: The breast tissue is heterogeneously dense, which may obscure small masses. FINDINGS: In the right breast there is possible architectural distortion and possible asymmetry require further evaluation. In the left breast possible asymmetry requires further evaluation. Images were processed with CAD. IMPRESSION: Further evaluation is suggested for possible architectural distortion and asymmetry in the right breast. Further evaluation is suggested for possible asymmetry in the left breast.  RECOMMENDATION: Diagnostic mammogram and possibly ultrasound of both breasts. (Code:[id number]-B-62M) The patient will be contacted regarding the findings, and additional imaging will be scheduled. BI-RADS CATEGORY  0: Incomplete. Need additional imaging evaluation and/or prior mammograms for comparison. Electronically Signed   By: Abelardo Diesel M.D.   On: 03/01/2016 08:36   US Breast Ltd Uni Right Inc Axilla  Result Date: 03/04/2016 CLINICAL DATA:  Possible architectural distortion in the 12 o'clock position of the right breast and possible asymmetries in the outer portions of both breasts on a recent screening mammogram. EXAM: 2D DIGITAL DIAGNOSTIC BILATERAL MAMMOGRAM WITH CAD AND ADJUNCT TOMO ULTRASOUND RIGHT BREAST COMPARISON:  Previous exam(s). ACR Breast Density Category c: The breast tissue is heterogeneously dense, which may obscure small masses. FINDINGS: 2D and 3D tomographic images of the right breast demonstrate an irregular, spiculated mass in the 12 o'clock position of the breast. Normal appearing breast tissue is demonstrated in the outer portion of each breast at the locations of recently suspected asymmetry. Mammographic images were processed with CAD. On physical exam, there is an approximately 2 cm rounded area of palpable soft tissue thickening in the 12 o'clock position of the right breast, 3 cm from the nipple. There are no palpable right axillary lymph nodes. Targeted ultrasound is performed, showing a 1.7 x 1.4 x 1.3 cm vertically oriented, irregular, hypoechoic mass with posterior acoustical shadowing in the 12 o'clock position of the right breast, 3 cm from the nipple. There is ill-defined increased echogenicity in the surrounding tissues. Ultrasound of the right axilla demonstrated multiple normal appearing right axillary lymph nodes. There was one lymph node with borderline cortical thickening, measuring 2.9 mm in maximum thickness. The cortex in this lymph node is thicker than the cortex in  the remainder the lymph nodes. IMPRESSION: 1. 1.7 cm mass in the 12 o'clock position of the right breast with imaging features highly suspicious for malignancy. 2. Borderline abnormal right axillary lymph node, suspicious for a metastatic node. RECOMMENDATION: Ultrasound guided core needle biopsy of the 1.7 cm mass in the 12 o'clock position of the right breast and ultrasound-guided core needle biopsy of the borderline abnormal right axillary lymph node. This has been discussed with the patient and scheduled at 7:30 a.m. on 03/08/2016. I have discussed the findings and recommendations with the patient. Results  were also provided in writing at the conclusion of the visit. If applicable, a reminder letter will be sent to the patient regarding the next appointment. BI-RADS CATEGORY  5: Highly suggestive of malignancy. Electronically Signed   By: Claudie Revering M.D.   On: 03/04/2016 16:17   Mm Diag Breast Tomo Bilateral  Result Date: 03/04/2016 CLINICAL DATA:  Possible architectural distortion in the 12 o'clock position of the right breast and possible asymmetries in the outer portions of both breasts on a recent screening mammogram. EXAM: 2D DIGITAL DIAGNOSTIC BILATERAL MAMMOGRAM WITH CAD AND ADJUNCT TOMO ULTRASOUND RIGHT BREAST COMPARISON:  Previous exam(s). ACR Breast Density Category c: The breast tissue is heterogeneously dense, which may obscure small masses. FINDINGS: 2D and 3D tomographic images of the right breast demonstrate an irregular, spiculated mass in the 12 o'clock position of the breast. Normal appearing breast tissue is demonstrated in the outer portion of each breast at the locations of recently suspected asymmetry. Mammographic images were processed with CAD. On physical exam, there is an approximately 2 cm rounded area of palpable soft tissue thickening in the 12 o'clock position of the right breast, 3 cm from the nipple. There are no palpable right axillary lymph nodes. Targeted ultrasound is  performed, showing a 1.7 x 1.4 x 1.3 cm vertically oriented, irregular, hypoechoic mass with posterior acoustical shadowing in the 12 o'clock position of the right breast, 3 cm from the nipple. There is ill-defined increased echogenicity in the surrounding tissues. Ultrasound of the right axilla demonstrated multiple normal appearing right axillary lymph nodes. There was one lymph node with borderline cortical thickening, measuring 2.9 mm in maximum thickness. The cortex in this lymph node is thicker than the cortex in the remainder the lymph nodes. IMPRESSION: 1. 1.7 cm mass in the 12 o'clock position of the right breast with imaging features highly suspicious for malignancy. 2. Borderline abnormal right axillary lymph node, suspicious for a metastatic node. RECOMMENDATION: Ultrasound guided core needle biopsy of the 1.7 cm mass in the 12 o'clock position of the right breast and ultrasound-guided core needle biopsy of the borderline abnormal right axillary lymph node. This has been discussed with the patient and scheduled at 7:30 a.m. on 03/08/2016. I have discussed the findings and recommendations with the patient. Results were also provided in writing at the conclusion of the visit. If applicable, a reminder letter will be sent to the patient regarding the next appointment. BI-RADS CATEGORY  5: Highly suggestive of malignancy. Electronically Signed   By: Claudie Revering M.D.   On: 03/04/2016 16:17   Mm Clip Placement Right  Result Date: 03/08/2016 CLINICAL DATA:  Status post ultrasound-guided core needle biopsy of a 1.7 cm mass in the 12 o'clock position of the right breast and a borderline abnormal right axillary lymph node. EXAM: DIAGNOSTIC RIGHT MAMMOGRAM POST ULTRASOUND BIOPSY COMPARISON:  Previous exam(s). FINDINGS: Mammographic images were obtained following ultrasound guided biopsy of the 1.7 cm mass in the 12 o'clock position of the right breast and a borderline abnormal right axillary lymph node. These  demonstrate a ribbon shaped biopsy marker clip in the anterior aspect of the biopsied mass in the 12 o'clock position of the breast. The HydroMARK clip in the right axilla is not visible on the images. This is most likely due to the deep position of the biopsied lymph node in the axilla. IMPRESSION: Appropriate deployment of the ribbon shaped biopsy marker clip in the anterior aspect of the biopsied mass in the 12 o'clock position  of the right breast. Final Assessment: Post Procedure Mammograms for Marker Placement Electronically Signed   By: Claudie Revering M.D.   On: 03/08/2016 08:59   [place] Rt Breast Bx W Loc Dev 1st Lesion Img Bx Spec US Guide  Result Date: 03/08/2016 CLINICAL DATA:  1.7 cm highly suspicious mass in the 12 o'clock position of the right breast at recent mammography and ultrasound. EXAM: ULTRASOUND GUIDED RIGHT BREAST CORE NEEDLE BIOPSY COMPARISON:  Previous exam(s). FINDINGS: I met with the patient and we discussed the procedure of ultrasound-guided biopsy, including benefits and alternatives. We discussed the high likelihood of a successful procedure. We discussed the risks of the procedure, including infection, bleeding, tissue injury, clip migration, and inadequate sampling. Informed written consent was given. The usual time-out protocol was performed immediately prior to the procedure. Using sterile technique and 1% Lidocaine as local anesthetic, under direct ultrasound visualization, a 12 gauge spring-loaded device was used to perform biopsy of the recently demonstrated 1.7 cm spiculated mass in the 12 o'clock position of the right breast using a inferolateral approach. At the conclusion of the procedure a ribbon shaped tissue marker clip was deployed into the biopsy cavity. Follow up 2 view mammogram was performed and dictated separately. IMPRESSION: Ultrasound guided biopsy of a 1.7 cm spiculated mass in the 12 o'clock position of the right breast. No apparent complications. Electronically  Signed   By: Claudie Revering M.D.   On: 03/08/2016 08:42   [place] Rt Breast Bx W Loc Dev Ea Add Lesion Img Bx Spec US Guide  Result Date: 03/08/2016 CLINICAL DATA:  Borderline abnormal right axillary lymph node at recent ultrasound. Right breast mass in the 12 o'clock position, highly suspicious for malignancy at recent imaging. EXAM: ULTRASOUND GUIDED CORE NEEDLE BIOPSY OF A RIGHT AXILLARY NODE COMPARISON:  Previous exam(s). FINDINGS: I met with the patient and we discussed the procedure of ultrasound-guided biopsy, including benefits and alternatives. We discussed the high likelihood of a successful procedure. We discussed the risks of the procedure, including infection, bleeding, tissue injury, clip migration, and inadequate sampling. Informed written consent was given. The usual time-out protocol was performed immediately prior to the procedure. Using sterile technique and 1% Lidocaine as local anesthetic, under direct ultrasound visualization, a 14 gauge spring-loaded device was used to perform biopsy of the recently demonstrated borderline abnormal right axillary lymph node using a inferior approach. At the conclusion of the procedure a HydroMARK tissue marker clip was deployed into the biopsy cavity. Follow up 2 view mammogram was performed and dictated separately. IMPRESSION: Ultrasound guided biopsy of a borderline abnormal right axillary lymph node. No apparent complications. Electronically Signed   By: Claudie Revering M.D.   On: 03/08/2016 08:43       IMPRESSION/PLAN: 1. Stage IA, cT1c, N0, Mx ER weakly positive, PR negative invasive ductal carcinoma of the right breast. Dr. Lisbeth Renshaw discusses the pathology findings and reviews the nature of invasive disease. The consensus from the breast conference include breast conservation with lumpectomy with sentinel mapping. Oncotype testing would be pursued and provided that chemotherapy is not indicated, the patient's course would then be followed by external  radiotherapy to the breast followed by antiestrogen therapy. We discussed the risks, benefits, short, and long term effects of radiotherapy, and the patient is interested in proceeding. Dr. Lisbeth Renshaw discusses the delivery and logistics of radiotherapy. Oncotype testing and pathologic findings would determine her course of either 4-6 1/2 weeks of treatment, though we would anticipate 4 weeks of treatment with  the date we have to date. We will see her back about 2 weeks after surgery to move forward with the simulation and planning process and anticipate starting radiotherapy about 4 weeks after surgery.  2. Possible genetic predisposition to breast cancer. The patient has a family history and now a personal history of breast cancer. She will be meeting with genetic counseling tomorrow. This data may impact her decisions for surgery, but more importantly could influence screening of her contralateral breast and could impact other family members screening.   The above documentation reflects my direct findings during this shared patient visit. Please see the separate note by Dr. Lisbeth Renshaw on this date for the remainder of the patient's plan of care.    Carola Rhine, PAC

## 2016-03-16 NOTE — Progress Notes (Signed)
Nutrition Assessment  Reason for Assessment:  Pt seen in Breast Clinic  ASSESSMENT:   61 year old female diagnosis with breast cancer.  Past medical history of HTN  Patient reports normal appetite  Medications:  reviewed  Labs: reviewed  Anthropometrics:   Height: 65 inches Weight: 155 pounds BMI: 26   NUTRITION DIAGNOSIS: Food and nutrition related knowledge deficit related to new diagnosis of breast cancer as evidenced by no prior need for nutrition related information.  INTERVENTION:   Discussed and provided packet of information regarding nutritional tips for breast cancer patients.  Questions answered.  Teachback method used.      MONITORING, EVALUATION, and GOAL: Pt will consume a healthy plant based diet to maintain lean body mass throughout treatment.   Harl Wiechmann B. Zenia Resides, [place], [place] (pager)

## 2016-03-16 NOTE — Progress Notes (Signed)
Clinical Social Work [facility] Psychosocial Distress Screening [place]  Patient completed distress screening protocol and scored a 5 on the Psychosocial Distress Thermometer which indicates moderate distress. Clinical Social Worker met with patient and patients family in [facility] to assess for distress and other psychosocial needs. Patient stated she was feeling overwhelmed but felt "better" after meeting with the treatment team and getting more information on her treatment plan. CSW and patient discussed common feeling and emotions when being diagnosed with cancer, and the importance of support during treatment. CSW informed patient of the support team and support services at [facility]. CSW provided contact information and encouraged patient to call with any questions or concerns.  ONCBCN DISTRESS SCREENING 03/16/2016  Screening Type Initial Screening  Distress experienced in past week (1-10) 5  Emotional problem type Nervousness/Anxiety  Information Concerns Type Lack of info about diagnosis;Lack of info about treatment;Lack of info about complementary therapy choices  Physician notified of physical symptoms Yes     Johnnye Lana, MSW, LCSW, OSW-C Clinical Social Worker [place] [phone]

## 2016-03-16 NOTE — Therapy (Signed)
[place], [place], [address] Phone: [phone]   Fax:  [phone]  Physical Therapy Evaluation  Patient Details  Name: Chelsea Salinas MRN: [id number] Date of Birth: Apr 06, 1955 Referring Provider: Dr. [name]  Encounter Date: 03/16/2016      PT End of Session - 03/16/16 1701    Visit Number 1   Number of Visits 1   PT Start Time 1017   PT Stop Time 1038   PT Time Calculation (min) 21 min   Activity Tolerance Patient tolerated treatment well   Behavior During Therapy [facility] for tasks assessed/performed      Past Medical History:  Diagnosis Date  . Abnormal Pap smear of cervix    --in her 20s had colpo and some type of treatment to cervix.  . Allergy   . Asthma   . Eczema   . Hypertension   . STD (sexually transmitted disease)    HSV II    Past Surgical History:  Procedure Laterality Date  . DILATION AND CURETTAGE OF UTERUS  2008    There were no vitals filed for this visit.       Subjective Assessment - 03/16/16 1655    Subjective Patient reports she is here today to be seen by her medical team for her newly diagnosed right breast cancer.   Patient is accompained by: Family member   Pertinent History Patient was diagnosed on 02/29/16 with right grade 2 invasive ductal carcinoma breast cancer. It measures 1.7 cm and is located in the upper outer quadrant.  It is ER positive, PR negative and HER2 negative with a Ki67 of 20%.    Patient Stated Goals reduce lymphedema risk and learn post op shoulder ROM HEP   Currently in Pain? No/denies            [facility] PT Assessment - 03/16/16 0001      Assessment   Medical Diagnosis Right breast cancer   Referring Provider Dr. [name]   Onset Date/Surgical Date 02/29/16   Hand Dominance Right   Prior Therapy none     Precautions   Precautions Other (comment)   Precaution Comments active cancer     Restrictions   Weight  Bearing Restrictions No     Balance Screen   Has the patient fallen in the past 6 months No   Has the patient had a decrease in activity level because of a fear of falling?  No   Is the patient reluctant to leave their home because of a fear of falling?  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   [employer]   Leisure She walks daily > 1 hour and does yoga 5x/week     Cognition   Overall Cognitive Status Within Functional Limits for tasks assessed     Posture/Postural Control   Posture/Postural Control No significant limitations     ROM / Strength   AROM / PROM / Strength AROM;Strength     AROM   AROM Assessment Site Shoulder;Cervical   Right/Left Shoulder Right;Left   Right Shoulder Extension 59 Degrees   Right Shoulder Flexion 165 Degrees   Right Shoulder ABduction 176 Degrees   Right Shoulder Internal Rotation  82 Degrees   Right Shoulder External Rotation 90 Degrees   Left Shoulder Extension 44 Degrees   Left Shoulder Flexion 166 Degrees   Left Shoulder ABduction 175 Degrees   Left Shoulder Internal Rotation 70 Degrees   Left Shoulder External Rotation 90 Degrees   Cervical Flexion WNl   Cervical Extension WNL   Cervical - Right Side Bend WNL   Cervical - Left Side Bend WNL   Cervical - Right Rotation WNL   Cervical - Left Rotation WNL     Strength   Overall Strength Within functional limits for tasks performed           LYMPHEDEMA/ONCOLOGY QUESTIONNAIRE - 03/16/16 1700      Type   Cancer Type RIght breast cancer     Lymphedema Assessments   Lymphedema Assessments Upper extremities     Right Upper Extremity Lymphedema   10 cm Proximal to Olecranon Process 26.5 cm   Olecranon Process 23.2 cm   10 cm  Proximal to Ulnar Styloid Process 22.2 cm   Just Proximal to Ulnar Styloid Process 14.9 cm   Across Hand at [employer] 19.4 cm   At [place] of 2nd Digit 6.8 cm     Left Upper Extremity Lymphedema   10 cm Proximal to Olecranon Process 27.2 cm   Olecranon Process 23.6 cm   10 cm Proximal to Ulnar Styloid Process 21.8 cm   Just Proximal to Ulnar Styloid Process 15 cm   Across Hand at [employer] 19.5 cm   At [place] of 2nd Digit 6.5 cm      Patient was instructed today in a home exercise program today for post op shoulder range of motion. These included active assist shoulder flexion in sitting, scapular retraction, wall walking with shoulder abduction, and hands behind head external rotation.  She was encouraged to do these twice a day, holding 3 seconds and repeating 5 times when permitted by her physician.         PT Education - 03/16/16 1701    Education provided Yes   Education Details Lymphedema risk reduction and post op shoulder ROM HEP   Person(s) Educated Patient   Methods Explanation;Demonstration;Handout   Comprehension Returned demonstration;Verbalized understanding              Breast Clinic Goals - 03/16/16 1705      Patient will be able to verbalize understanding of pertinent lymphedema risk reduction practices relevant to her diagnosis specifically related to skin care.   Time 1   Period Days   Status Achieved     Patient will be able to return demonstrate and/or verbalize understanding of the post-op home exercise program related to regaining shoulder range of motion.   Time 1   Period Days   Status Achieved     Patient will be able to verbalize understanding of the importance of attending the postoperative After Breast Cancer Class for further lymphedema risk reduction education and therapeutic exercise.   Time 1   Period Days   Status Achieved              Plan - 03/16/16 1702    Clinical Impression Statement Patient was diagnosed on  02/29/16 with right grade 2 invasive ductal carcinoma breast cancer. It measures 1.7 cm and is located in the upper outer quadrant.  It is ER positive, PR negative and HER2 negative with a Ki67 of 20%. Her multidisciplinary medical team met prior to her assessments to determine a  recommended treatment plan.  She is planning to have genetic testing, a right lumpectomy and sentinel node biopsy followed by Oncotype testing, radiation, and anti-estrogen therapy. She may benefit from post op PT to regain shoulder ROM and reduce lymphedema risk. Due to her lack of comorbidities, her eval is of low complexity.   Rehab Potential Excellent   Clinical Impairments Affecting Rehab Potential none   PT Frequency One time visit   PT Treatment/Interventions Patient/family education;Therapeutic exercise   PT Next Visit Plan Will f.u after surgery as needed   PT Home Exercise Plan Post op shoulder ROM HEP   Consulted and Agree with Plan of Care Patient;Family member/caregiver   Family Member Consulted husband      Patient will benefit from skilled therapeutic intervention in order to improve the following deficits and impairments:  Postural dysfunction, Decreased knowledge of precautions, Pain, Impaired UE functional use, Decreased range of motion  Visit Diagnosis: Carcinoma of central portion of right breast in female, estrogen receptor positive ([place]) - Plan: PT plan of care cert/re-cert   Patient will follow up at outpatient cancer rehab if needed following surgery.  If the patient requires physical therapy at that time, a specific plan will be dictated and sent to the referring physician for approval. The patient was educated today on appropriate basic range of motion exercises to begin post operatively and the importance of attending the After Breast Cancer class following surgery.  Patient was educated today on lymphedema risk reduction practices as it pertains to recommendations that will benefit the patient  immediately following surgery.  She verbalized good understanding.  No additional physical therapy is indicated at this time.     Problem List Patient Active Problem List   Diagnosis Date Noted  . 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 breast in female, estrogen receptor positive ([place]) 03/15/2016  . Vitamin D deficiency 05/18/2015  . Hot flashes 05/18/2015  . Hypertension   . Asthma   . Allergy   . Eczema     Annia Friendly, [place] 03/16/16 5:07 PM  [place], [place], [address] Phone: [phone]   Fax:  [phone]  Name: Chelsea Salinas MRN: [id number] Date of Birth: [birth year]

## 2016-03-17 ENCOUNTER — Ambulatory Visit (HOSPITAL_BASED_OUTPATIENT_CLINIC_OR_DEPARTMENT_OTHER): Payer: BLUE CROSS/BLUE SHIELD | Admitting: Genetic Counselor

## 2016-03-17 ENCOUNTER — Encounter: Payer: Self-pay | Admitting: Hematology

## 2016-03-17 ENCOUNTER — Other Ambulatory Visit: Payer: BLUE CROSS/BLUE SHIELD

## 2016-03-17 ENCOUNTER — Encounter: Payer: Self-pay | Admitting: Genetic Counselor

## 2016-03-17 DIAGNOSIS — C50811 Malignant neoplasm of overlapping sites of right female breast: Secondary | ICD-10-CM | POA: Diagnosis not present

## 2016-03-17 DIAGNOSIS — Z8042 Family history of malignant neoplasm of prostate: Secondary | ICD-10-CM | POA: Diagnosis not present

## 2016-03-17 DIAGNOSIS — C50111 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 female breast: Secondary | ICD-10-CM | POA: Diagnosis not present

## 2016-03-17 DIAGNOSIS — Z803 Family history of malignant neoplasm of breast: Secondary | ICD-10-CM

## 2016-03-17 DIAGNOSIS — Z8041 Family history of malignant neoplasm of ovary: Secondary | ICD-10-CM

## 2016-03-17 DIAGNOSIS — Z8481 Family history of carrier of genetic disease: Secondary | ICD-10-CM

## 2016-03-17 NOTE — Progress Notes (Signed)
received Cancer Claim Forms from patient to be completed

## 2016-03-17 NOTE — Progress Notes (Signed)
[facility]   Patient Name: Chelsea Salinas Patient DOB: [birth year] Encounter Date: 03/17/2016  Referring Provider: Truitt Merle, MD  Primary Care Provider: Alesia Richards, MD  Reason for Visit: Evaluate for hereditary susceptibility to cancer   [name], a 61 y.o. female, is being seen at the [facility] due to a personal and family history of cancer as well as a reported BRCA mutation in her paternal family. She presents to clinic today to discuss the possibility of a hereditary predisposition to cancer and discuss whether genetic testing is warranted.  History of Present Illness: Chelsea Salinas was diagnosed with right breast cancer at the age of 32. She will be using results of genetic testing to help guide her surgical decisions.   She recently learned of a BRCA mutation in her paternal family. She does not have a copy of a relative's report for our review today.   Oncology History   Cancer Staging 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 breast in female, estrogen receptor positive ([place]) Staging form: Breast, AJCC 8th Edition - Clinical stage from 03/08/2016: Stage IA (cT1c, cN0, cM0, G2, ER: Positive, PR: Negative, HER2: Negative) - Signed by Truitt Merle, MD on 03/15/2016       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 breast in female, estrogen receptor positive ([place])   03/04/2016 Mammogram    Diagnostic bilateral mammogram and right breast ultrasound showed a 1.7 cm mass in the 12:00 position of the right breast, highly suspicious for malignancy. There is a borderline abnormal right axillary lymph node, also suspicious for metastasis.       03/08/2016 Initial Diagnosis    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 breast in female, estrogen receptor positive ([place])      03/08/2016 Initial Biopsy    Right breast 12:00 core needle biopsy showed invasive ductal carcinoma, grade 2, right axillary  lymph node biopsy was negative.      03/08/2016 Receptors her2    ER 20% positive, PR negative, HER-2 negative, Ki-67 20%        Past Medical History:  Diagnosis Date  . Abnormal Pap smear of cervix    --in her 20s had colpo and some type of treatment to cervix.  . Allergy   . Asthma   . Breast cancer ([place])   . Eczema   . Family history of breast cancer   . Family history of genetic disease carrier    paternal relatives with a BRCA mutation  . Family history of ovarian cancer   . Hypertension   . STD (sexually transmitted disease)    HSV II    Past Surgical History:  Procedure Laterality Date  . DILATION AND CURETTAGE OF UTERUS  2008    Social History   Social History  . Marital status: Married    Spouse name: N/A  . Number of children: N/A  . Years of education: N/A   Social History Main Topics  . Smoking status: Former Smoker    Packs/day: 1.00    Years: 25.00    Quit date: 02/08/1996  . Smokeless tobacco: Never Used  . Alcohol use 10.8 - 11.4 oz/week    3 - 4 Glasses of wine, 15 Standard drinks or equivalent per week     Comment: daily drinker for over 30 years   . Drug use: No  . Sexual activity: No   Other Topics Concern  .  Not on file   Social History Narrative  . No narrative on file     Family History:  During the visit, a 4-generation pedigree was obtained. Family tree will be scanned in the Media tab in Epic  Significant diagnoses include the following:  Family History  Problem Relation Age of Onset  . Polymyalgia rheumatica Mother   . Hypertension Father   . Heart disease Father     Dec 69  . Heart attack Father   . Prostate cancer Father     Dx 56s; deceased 72  . Breast cancer Cousin     Dx 31s; daughter of a paternal uncle  . Ovarian cancer Paternal Aunt     Dx 89; deceased  . Ovarian cancer Cousin     Dx 57s; daughter of a paternal uncle  . Ovarian cancer Cousin     Dx 33; daughter of a paternal uncle  . Ovarian cancer  Paternal Aunt     Dx 83s.  BRCA positive  . Ovarian cancer Other     pat grandfather's mother    Additionally, Chelsea Salinas has no children. She has a sister (age 57) and a brother (age 30) who are cancer-free; they have not yet undergone genetic testing. Her mother (age 13) is cancer-free. She has only one brother (age 49). Her father had a total of 4 sisters and 4 brothers. There are no cancers reported in any grandparents.  [name]' ancestry is Caucasian - NOS. There is no known Jewish ancestry and no consanguinity.  Assessment and Plan: [name] is a 61 y.o. female with a personal and family history of cancer as noted above and a BRCA mutation in paternal relatives. Her family history is certainly consistent with a BRCA mutation. We discussed that it may be that she did not inherit the mutation and that her breast cancer is a phenocopy. Genetic testing is recommended to determine whether she has the pathogenic mutation that would impact her surgery for breast cancer as well as her cancer screening and risk-reduction options. We reviewed the characteristics, features and inheritance patterns of hereditary cancer syndromes. We discussed the process of genetic testing, including insurance coverage and implications of results: positive, negative and Variant of Uncertain Significance. A negative result will be reassuring.   Chelsea Salinas wished to pursue genetic testing and a blood sample will be sent to [facility] for analysis. [facility]'s STAT breast panel was requested as it will impact surgical decisions and results should be available in about 1 week. The 9 genes on this panel are ATM, BRCA1, BRCA2, CDH1, CHEK2, PALB2, PTEN, STK11, TP53. If this test is negative, analysis of additional genes on a larger hereditary cancer panel will proceed. She will be called after each result is obtained.  Chelsea Salinas is encouraged to remain in contact with Cancer Genetics annually so that we can update the family  history and inform her of any changes in cancer genetics and testing that may be of benefit for this family. Ms.  Salinas' questions were answered to her satisfaction today and she is welcome to call with any additional questions or concerns. Thank you for the referral and allowing [place] to share in the care of your patient.   Dr. Burr Medico was available for questions concerning this case. Total time spent by Steele Berg, MS, CGC in face-to-face counseling was approximately 30 minutes.   Steele Berg, MS, [place] Certified Genetic Counselor phone: [phone] [email or web address]   ______________________________________________________________________ For Office Staff:  Number  of people involved in session: 1 Was an Intern/ student involved with case: no

## 2016-03-18 ENCOUNTER — Encounter: Payer: Self-pay | Admitting: Hematology

## 2016-03-21 ENCOUNTER — Telehealth: Payer: Self-pay | Admitting: *Deleted

## 2016-03-21 NOTE — Telephone Encounter (Signed)
Left message for a return phone call to follow up after [facility].

## 2016-03-22 ENCOUNTER — Other Ambulatory Visit: Payer: Self-pay | Admitting: General Surgery

## 2016-03-23 ENCOUNTER — Encounter: Payer: Self-pay | Admitting: Genetic Counselor

## 2016-03-23 ENCOUNTER — Ambulatory Visit: Payer: Self-pay | Admitting: Genetic Counselor

## 2016-03-23 DIAGNOSIS — Z1509 Genetic susceptibility to other malignant neoplasm: Principal | ICD-10-CM

## 2016-03-23 DIAGNOSIS — Z1501 Genetic susceptibility to malignant neoplasm of breast: Secondary | ICD-10-CM | POA: Insufficient documentation

## 2016-03-23 NOTE — Progress Notes (Signed)
[facility]   Patient Name: Chelsea Salinas Patient DOB: March 14, 1955 Encounter Date: 03/23/2016  Referring Provider: Truitt Merle, MD  Reason for Call: Discuss results of genetic testing- 1st of 2 results   This is a brief note to document preliminary genetic test results.  [name] was called today to discuss the first of her genetic test results. Please see the Genetics note from her visit on 03/17/16. Due to time contraints and needing actionable results for medical management, [facility]'s STAT Breast panel was ordered first.  Preliminary Test Results: Genetic testing involved analysis of 9 genes: ATM, BRCA1, BRCA2, CDH1, CHEK2, PALB2, PTEN, and TP53 genes. 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 We discussed implications of this for her impending surgical decision. [name] will discuss this with her surgeon surgeon.  Testing is in process for the remaining genes on the Common Cancers panel.  Once results are obtained, [name] will be called again. She does not need to wait to proceed with surgery.   Steele Berg, MS, [place] Certified Genetic Counselor phone: [phone]

## 2016-03-24 ENCOUNTER — Telehealth: Payer: Self-pay | Admitting: Obstetrics and Gynecology

## 2016-03-24 NOTE — Telephone Encounter (Signed)
Patient is returning a call to Dr.Silva. °

## 2016-03-24 NOTE — Telephone Encounter (Signed)
Phone call returned.  I left message that I called and that I was sorry we are missing each other by phone.

## 2016-03-24 NOTE — Telephone Encounter (Signed)
Phone call to patient regarding new diagnosis of right breast cancer and positive BRCA1 status.   No details left on phone, just that I was reaching out to her right now and would like to speak with her.  I left the phone number for the office for her to call back.

## 2016-03-29 ENCOUNTER — Other Ambulatory Visit: Payer: Self-pay | Admitting: General Surgery

## 2016-03-29 ENCOUNTER — Other Ambulatory Visit: Payer: Self-pay | Admitting: Physician Assistant

## 2016-03-29 DIAGNOSIS — Z1501 Genetic susceptibility to malignant neoplasm of breast: Secondary | ICD-10-CM | POA: Diagnosis not present

## 2016-03-29 DIAGNOSIS — Z1509 Genetic susceptibility to other malignant neoplasm: Secondary | ICD-10-CM | POA: Diagnosis not present

## 2016-03-29 DIAGNOSIS — C50111 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 female breast: Secondary | ICD-10-CM | POA: Diagnosis not present

## 2016-03-29 DIAGNOSIS — C50411 Malignant neoplasm of upper-outer quadrant of right female breast: Secondary | ICD-10-CM | POA: Diagnosis not present

## 2016-03-29 DIAGNOSIS — Z170 Estrogen receptor positive status [ER+]: Secondary | ICD-10-CM | POA: Diagnosis not present

## 2016-03-29 DIAGNOSIS — Z1502 Genetic susceptibility to malignant neoplasm of ovary: Secondary | ICD-10-CM | POA: Diagnosis not present

## 2016-03-30 ENCOUNTER — Ambulatory Visit
Admission: RE | Admit: 2016-03-30 | Discharge: 2016-03-30 | Disposition: A | Discharge status: Home / Self Care | Payer: BLUE CROSS/BLUE SHIELD | Source: Ambulatory Visit | Attending: General Surgery | Admitting: General Surgery

## 2016-03-30 ENCOUNTER — Other Ambulatory Visit: Payer: Self-pay | Admitting: General Surgery

## 2016-03-30 DIAGNOSIS — C50411 Malignant neoplasm of upper-outer quadrant of right female breast: Secondary | ICD-10-CM

## 2016-03-30 DIAGNOSIS — Z1502 Genetic susceptibility to malignant neoplasm of ovary: Principal | ICD-10-CM

## 2016-03-30 DIAGNOSIS — C50911 Malignant neoplasm of unspecified site of right female breast: Secondary | ICD-10-CM | POA: Diagnosis not present

## 2016-03-30 DIAGNOSIS — Z1501 Genetic susceptibility to malignant neoplasm of breast: Secondary | ICD-10-CM

## 2016-03-30 DIAGNOSIS — Z1509 Genetic susceptibility to other malignant neoplasm: Principal | ICD-10-CM

## 2016-03-30 MED ORDER — GADOBENATE DIMEGLUMINE 529 MG/ML IV SOLN
13.0000 mL | Freq: Once | INTRAVENOUS | Status: AC | PRN
  Administered 2016-03-30: 13 mL via INTRAVENOUS

## 2016-03-31 ENCOUNTER — Telehealth: Payer: Self-pay | Admitting: *Deleted

## 2016-03-31 NOTE — Telephone Encounter (Signed)
I left a message that I was calling and that she can contact the office for anything she needs.   No details left.   Has recent diagnosis of breast cancer and BRCA1 positive status.

## 2016-03-31 NOTE — Telephone Encounter (Signed)
  Oncology Nurse Navigator Documentation  Navigator Location: [facility] (03/31/16 1000)   )        Surgery Date: 04/12/16 (03/31/16 1000)     Plastic Surgery Consult Date: 03/29/16 (03/31/16 1000)- Dr. Iran Planas     Treatment Initiated Date: 04/12/16 (03/31/16 1000)

## 2016-04-01 ENCOUNTER — Telehealth: Payer: Self-pay | Admitting: Obstetrics and Gynecology

## 2016-04-01 ENCOUNTER — Other Ambulatory Visit: Payer: Self-pay | Admitting: General Surgery

## 2016-04-01 DIAGNOSIS — Z8041 Family history of malignant neoplasm of ovary: Secondary | ICD-10-CM

## 2016-04-01 NOTE — Telephone Encounter (Signed)
Phone call returned to patient on her cell.  I left a message to encourage her to keep her upcoming appointment and call to reschedule if she has a conflict with that date.

## 2016-04-01 NOTE — Telephone Encounter (Signed)
Patient returned your call and states she currently does not need anything but if you need to speak with her you can reach her on her cell.

## 2016-04-05 ENCOUNTER — Ambulatory Visit
Admission: RE | Admit: 2016-04-05 | Discharge: 2016-04-05 | Disposition: A | Discharge status: Home / Self Care | Payer: BLUE CROSS/BLUE SHIELD | Source: Ambulatory Visit | Attending: General Surgery | Admitting: General Surgery

## 2016-04-05 DIAGNOSIS — Z8041 Family history of malignant neoplasm of ovary: Secondary | ICD-10-CM | POA: Diagnosis not present

## 2016-04-06 ENCOUNTER — Telehealth: Payer: Self-pay | Admitting: Obstetrics and Gynecology

## 2016-04-06 NOTE — Telephone Encounter (Signed)
Please contact patient regarding her normal pelvic ultrasound which was actually ordered by [name].  He ordered this due to her recent diagnosis of BRCA1 positive status and her recent dx of breast cancer.   I have had difficulty connecting with the patient by phone and have called on multiple occasions.   She has an appointment with me on 04/29/16 for her annual exam, and we can talk more about her ovarian health at that time.   Please check to see if she will be able to keep that appointment.

## 2016-04-07 NOTE — Telephone Encounter (Signed)
Call to patient. Message given as seen below from Dr. Quincy Simmonds. Patient verbalized understanding. Patient states she is having surgery next week, but her plan is to be "out and about" by the time of her AEX. She states she plans to keep AEX appointment unless something changes.    Routing to provider for final review. Patient agreeable to disposition. Will close encounter.

## 2016-04-08 ENCOUNTER — Ambulatory Visit: Payer: Self-pay | Admitting: Genetic Counselor

## 2016-04-08 ENCOUNTER — Encounter (HOSPITAL_COMMUNITY): Payer: Self-pay | Admitting: *Deleted

## 2016-04-08 ENCOUNTER — Encounter (HOSPITAL_COMMUNITY)
Admission: RE | Admit: 2016-04-08 | Discharge: 2016-04-08 | Disposition: A | Discharge status: Home / Self Care | Payer: BLUE CROSS/BLUE SHIELD | Source: Ambulatory Visit | Attending: General Surgery | Admitting: General Surgery

## 2016-04-08 DIAGNOSIS — I10 Essential (primary) hypertension: Secondary | ICD-10-CM | POA: Insufficient documentation

## 2016-04-08 DIAGNOSIS — L309 Dermatitis, unspecified: Secondary | ICD-10-CM | POA: Diagnosis not present

## 2016-04-08 DIAGNOSIS — Z01810 Encounter for preprocedural cardiovascular examination: Secondary | ICD-10-CM | POA: Insufficient documentation

## 2016-04-08 DIAGNOSIS — I252 Old myocardial infarction: Secondary | ICD-10-CM | POA: Insufficient documentation

## 2016-04-08 DIAGNOSIS — E559 Vitamin D deficiency, unspecified: Secondary | ICD-10-CM | POA: Insufficient documentation

## 2016-04-08 DIAGNOSIS — Z01812 Encounter for preprocedural laboratory examination: Secondary | ICD-10-CM | POA: Diagnosis not present

## 2016-04-08 DIAGNOSIS — J45909 Unspecified asthma, uncomplicated: Secondary | ICD-10-CM | POA: Insufficient documentation

## 2016-04-08 DIAGNOSIS — C50919 Malignant neoplasm of unspecified site of unspecified female breast: Secondary | ICD-10-CM | POA: Insufficient documentation

## 2016-04-08 DIAGNOSIS — Z1379 Encounter for other screening for genetic and chromosomal anomalies: Secondary | ICD-10-CM

## 2016-04-08 LAB — BASIC METABOLIC PANEL
Anion gap: 8 (ref 5–15)
BUN: 13 mg/dL (ref 6–20)
CO2: 27 mmol/L (ref 22–32)
Calcium: 9.5 mg/dL (ref 8.9–10.3)
Chloride: 103 mmol/L (ref 101–111)
Creatinine, Ser: 0.64 mg/dL (ref 0.44–1.00)
GFR calc Af Amer: >60 mL/min (ref >60)
GFR calc non Af Amer: >60 mL/min (ref >60)
Glucose, Bld: 90 mg/dL (ref 65–99)
POTASSIUM: 4.2 mmol/L (ref 3.5–5.1)
Sodium: 138 mmol/L (ref 135–145)

## 2016-04-08 LAB — CBC
HCT: 37.9 % (ref 36.0–46.0)
Hemoglobin: 12.8 g/dL (ref 12.0–15.0)
MCH: 32.6 pg (ref 26.0–34.0)
MCHC: 33.8 g/dL (ref 30.0–36.0)
MCV: 96.4 fL (ref 78.0–100.0)
Platelets: 211 10*3/uL (ref 150–400)
RBC: 3.93 MIL/uL (ref 3.87–5.11)
RDW: 12.8 % (ref 11.5–15.5)
WBC: 4.9 10*3/uL (ref 4.0–10.5)

## 2016-04-08 NOTE — Pre-Procedure Instructions (Addendum)
    Chelsea Salinas  04/08/2016      [facility] [place] [address] Phone: [phone] Fax: [phone]    Your procedure is scheduled on Tuesday, March 6th   Report to [facility] Admitting at 11:45 AM             (posted surgery time 1:48 pm - 4:18 pm)   Call this number if you have problems the morning of surgery:  [phone], otherwise you may call [phone], Mon thru Fri from 8:00 am - 4:30 pm   Remember:  Do not eat food or drink liquids after midnight Monday.              However 2 hours prior to arrival time, please drink the "Boost" supplement.   Take these medicines the morning of surgery with A SIP OF WATER : Lexapro, Effexor, Singular if needed              4-5 days prior to surgery, STOP taking Vitamins, Herbal Supplements, Anti-inflammatories   Do not wear jewelry, make-up or nail polish.  Do not wear lotions, powders,  perfumes, or deoderant.  Do not shave underarms & legs 48 hours prior to surgery.     Do not bring valuables to the hospital.  [facility] is not responsible for any belongings or valuables.  Contacts, dentures or bridgework may not be worn into surgery.  Leave your suitcase in the car.  After surgery it may be brought to your room. For patients admitted to the hospital, discharge time will be determined by your treatment team.  Please read over the following fact sheets that you were given. Pain Booklet and Surgical Site Infection Prevention

## 2016-04-08 NOTE — Progress Notes (Signed)
[facility]         Genetic Test Results   Patient Name: Chelsea Salinas Patient Age: 61 y.o. Encounter Date: 04/08/2016  Referring Provider: Truitt Merle, MD  Reason for Call: Discuss results of genetic testing   Chelsea Salinas was seen in the [place] clinic on 03/17/16 due to a personal and family history of cancer and concern regarding a hereditary predisposition to cancer in the family. Please refer to the prior Genetics clinic note for more information regarding Chelsea Salinas' medical and family histories and our assessment at the time.   Genetic Testing: At the time of Chelsea Salinas' visit, we recommended she pursue genetic testing of multiple genes associated with hereditary predisposition to cancer. She was first tested for 9 genes needed for surgical decisions, followed by additional genes related to hereditary cancer risk. Testing included sequencing and deletion/duplication analysis. 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  A copy of the genetic test report will be scanned into Epic under the media tab.  BRCA-Related Cancer Risks: Women with a BRCA mutation have a 38-84% lifetime risk of developing breast cancer, compared to the general population's risk of 12%. They also have an increased risk of developing ovarian cancer (20-44%), which is significantly higher than the general population's risk (1-2%). Risks for other types of cancer, including prostate cancer, pancreatic cancer, female breast cancer and melanoma, are increased as well.   Medical Management: Chelsea Salinas is at increased risk of cancer and we, therefore, discussed the following recommendations from the NCCN (Genetic/Familial High-Risk Assessment: Breast and Ovarian V1.2018) that are specific to women with a BRCA mutation. She has been diagnosed with breast cancer recently and the recommendations below will be personalized for her based on her upcoming breast surgery.   Breast  Cancer:  - Starting at age 66: Breast awareness - Women should be familiar with their breasts and promptly report changes to their healthcare provider. Performing regular breast self exams may help increase breast awareness, especially when checked at the end of the menstrual cycle.  - Starting at age 51: Clinical breast exam every 6-12 months. - Between ages 78-29: Breast MRI screening with contrast (preferred) every year or mammogram with consideration of tomosynthesis if MRI is unavailable; or individualized based on family history if a breast cancer diagnosis before age 87 is present. - Between ages 30-75: Mammogram with consideration of tomosynthesis and breast MRI screening with contrast every year.  - After age 70: Management should be considered on an individualized basis. - Option of risk-reducing bilateral mastectomies. - Consider risk reduction agents as options for breast cancer, including discussing risks and benefits.  Ovarian Cancer: - Recommend risk-reducing salpingo-oophorectomy (RRSO), typically between 96 and 13 y, and upon completion of child bearing. Because ovarian cancer onset in patients with BRCA2 mutations is an average of 8-10 years later than in patients with BRCA1 mutations, it is reasonable to delay RRSO until age 83-45 y in patients with BRCA2 mutations.  - While there may be circumstances where ovarian cancer screening with transvaginal ultrasound and a blood test for a protein called CA-125 are helpful, these techniques have not been shown to be effective in detecting early ovarian cancer and are generally not recommended. - Consider risk reduction agents as options for ovarian cancer, including discussing risks and benefits.  Other Cancers: We discussed that while literature has shown other cancers, such as pancreatic cancer, to be associated with BRCA1 mutations, national guidelines do  not currently recommend any specific screenings for these cancers. Screening may  be individualized based on the family history.  Family Members: It is important that all of Chelsea Salinas' relatives (both men and women) know of the presence of this gene mutation. Genetic testing can sort out who in the family is at risk and who is not.   Chelsea Salinas has no children. Her siblings each has a 50% chance to have inherited this mutation. We recommend they see a genetic counselor and have genetic testing, as identifying the presence of this mutation would allow them to also take advantage of risk-reducing measures. Chelsea Salinas was provided a copy of her results to send to her relatives.  Support and Resources: If Chelsea Salinas is interested in BRCA-specific information and support, there are two groups, Facing Our Risk (www.facingourrisk.com) and Bright Pink (www.brightpink.org) which some people have found useful. They provide opportunities to speak with other individuals from high-risk families. To locate genetic counselors in other cities, visit the website of the [employer] (ArtistMovie.se) and Secretary/administrator for a Social worker by zip code.  We encouraged Chelsea Salinas to remain in contact with [place] on an annual basis so we can update her personal and family histories, and let her know of advances in cancer genetics that may benefit the family. Our contact number was provided and she knows she is welcome to call anytime with additional questions.    Steele Berg, MS, [place] Certified Genetic Counselor phone: [phone]

## 2016-04-08 NOTE — Progress Notes (Signed)
PCP is Dr. Maretta Los [name], PA most of time.  LOV 11/2015 Had seen a cardio 15 yrs ago--she said PCP thought her EKG looked "funny".  Had stress test -- negative results. Denies any chest pain, sob, palpitations.

## 2016-04-11 NOTE — Progress Notes (Signed)
Anesthesia Chart Review:  Pt is a 61 year old female scheduled for B total mastectomies with R sentinel lymph node biopsy on 04/12/2016 with [name], MD.   PMH includes:  HTN, asthma, breast cancer. Former smoker. BMI 25.5  Medications include: ASA 81 mg, clonidine, losartan, magnesium.   Preoperative labs reviewed.    EKG 04/08/16: NSR. Septal infarct, age undetermined.   If no changes, I anticipate pt can proceed with surgery as scheduled.   Willeen Cass, FNP-BC [facility]/Anesthesiology Phone: [phone] 04/11/2016 11:12 AM

## 2016-04-12 ENCOUNTER — Encounter (HOSPITAL_COMMUNITY)
Admission: RE | Admit: 2016-04-12 | Discharge: 2016-04-12 | Disposition: A | Discharge status: Home / Self Care | Payer: BLUE CROSS/BLUE SHIELD | Source: Ambulatory Visit | Attending: General Surgery | Admitting: General Surgery

## 2016-04-12 ENCOUNTER — Ambulatory Visit (HOSPITAL_COMMUNITY): Payer: BLUE CROSS/BLUE SHIELD | Admitting: Emergency Medicine

## 2016-04-12 ENCOUNTER — Observation Stay (HOSPITAL_COMMUNITY)
Admission: RE | Admit: 2016-04-12 | Discharge: 2016-04-14 | Disposition: A | Discharge status: Home / Self Care | Payer: BLUE CROSS/BLUE SHIELD | Source: Ambulatory Visit | Attending: General Surgery | Admitting: General Surgery

## 2016-04-12 ENCOUNTER — Ambulatory Visit (HOSPITAL_COMMUNITY): Payer: BLUE CROSS/BLUE SHIELD | Admitting: Anesthesiology

## 2016-04-12 ENCOUNTER — Encounter (HOSPITAL_COMMUNITY): Payer: Self-pay | Admitting: *Deleted

## 2016-04-12 ENCOUNTER — Encounter (HOSPITAL_COMMUNITY)
Admission: RE | Disposition: A | Discharge status: Home / Self Care | Payer: Self-pay | Source: Ambulatory Visit | Attending: General Surgery

## 2016-04-12 DIAGNOSIS — Z1501 Genetic susceptibility to malignant neoplasm of breast: Secondary | ICD-10-CM

## 2016-04-12 DIAGNOSIS — Z803 Family history of malignant neoplasm of breast: Secondary | ICD-10-CM | POA: Diagnosis not present

## 2016-04-12 DIAGNOSIS — Z1509 Genetic susceptibility to other malignant neoplasm: Principal | ICD-10-CM

## 2016-04-12 DIAGNOSIS — N6012 Diffuse cystic mastopathy of left breast: Secondary | ICD-10-CM | POA: Diagnosis not present

## 2016-04-12 DIAGNOSIS — C50911 Malignant neoplasm of unspecified site of right female breast: Principal | ICD-10-CM | POA: Insufficient documentation

## 2016-04-12 DIAGNOSIS — G8918 Other acute postprocedural pain: Secondary | ICD-10-CM | POA: Diagnosis not present

## 2016-04-12 DIAGNOSIS — L7632 Postprocedural hematoma of skin and subcutaneous tissue following other procedure: Secondary | ICD-10-CM | POA: Diagnosis not present

## 2016-04-12 DIAGNOSIS — N6092 Unspecified benign mammary dysplasia of left breast: Secondary | ICD-10-CM | POA: Insufficient documentation

## 2016-04-12 DIAGNOSIS — Z79899 Other long term (current) drug therapy: Secondary | ICD-10-CM | POA: Insufficient documentation

## 2016-04-12 DIAGNOSIS — Z8041 Family history of malignant neoplasm of ovary: Secondary | ICD-10-CM | POA: Insufficient documentation

## 2016-04-12 DIAGNOSIS — Y838 Other surgical procedures as the cause of abnormal reaction of the patient, or of later complication, without mention of misadventure at the time of the procedure: Secondary | ICD-10-CM | POA: Insufficient documentation

## 2016-04-12 DIAGNOSIS — Z87891 Personal history of nicotine dependence: Secondary | ICD-10-CM | POA: Diagnosis not present

## 2016-04-12 DIAGNOSIS — D242 Benign neoplasm of left breast: Secondary | ICD-10-CM | POA: Diagnosis not present

## 2016-04-12 DIAGNOSIS — Z1502 Genetic susceptibility to malignant neoplasm of ovary: Principal | ICD-10-CM

## 2016-04-12 DIAGNOSIS — Z4001 Encounter for prophylactic removal of breast: Secondary | ICD-10-CM | POA: Insufficient documentation

## 2016-04-12 DIAGNOSIS — C50411 Malignant neoplasm of upper-outer quadrant of right female breast: Secondary | ICD-10-CM | POA: Diagnosis not present

## 2016-04-12 DIAGNOSIS — I10 Essential (primary) hypertension: Secondary | ICD-10-CM | POA: Diagnosis not present

## 2016-04-12 DIAGNOSIS — C50111 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 female breast: Secondary | ICD-10-CM | POA: Diagnosis not present

## 2016-04-12 HISTORY — PX: MASTECTOMY W/ SENTINEL NODE BIOPSY: SHX2001

## 2016-04-12 HISTORY — DX: Malignant neoplasm of unspecified site of right female breast: C50.911

## 2016-04-12 SURGERY — MASTECTOMY WITH SENTINEL LYMPH NODE BIOPSY
Anesthesia: General | Site: Breast | Laterality: Bilateral

## 2016-04-12 MED ORDER — GABAPENTIN 300 MG PO CAPS
300.0000 mg | ORAL_CAPSULE | ORAL | Status: AC
  Administered 2016-04-12: 300 mg via ORAL
  Filled 2016-04-12: qty 1

## 2016-04-12 MED ORDER — ACETAMINOPHEN 500 MG PO TABS
1000.0000 mg | ORAL_TABLET | ORAL | Status: AC
  Administered 2016-04-12: 1000 mg via ORAL
  Filled 2016-04-12: qty 2

## 2016-04-12 MED ORDER — METHYLENE BLUE 0.5 % INJ SOLN
INTRAVENOUS | Status: AC
  Filled 2016-04-12: qty 10

## 2016-04-12 MED ORDER — TECHNETIUM TC 99M SULFUR COLLOID FILTERED
1.0000 | Freq: Once | INTRAVENOUS | Status: AC | PRN
  Administered 2016-04-12: 1 via INTRADERMAL

## 2016-04-12 MED ORDER — OXYCODONE HCL 5 MG PO TABS
ORAL_TABLET | ORAL | Status: AC
  Filled 2016-04-12: qty 1

## 2016-04-12 MED ORDER — HYDROMORPHONE HCL 1 MG/ML IJ SOLN
INTRAMUSCULAR | Status: AC
  Filled 2016-04-12: qty 1

## 2016-04-12 MED ORDER — METHOCARBAMOL 500 MG PO TABS
500.0000 mg | ORAL_TABLET | Freq: Four times a day (QID) | ORAL | Status: DC | PRN
  Administered 2016-04-12 – 2016-04-13 (×3): 500 mg via ORAL
  Filled 2016-04-12 (×2): qty 1

## 2016-04-12 MED ORDER — METHOCARBAMOL 500 MG PO TABS
ORAL_TABLET | ORAL | Status: AC
  Filled 2016-04-12: qty 1

## 2016-04-12 MED ORDER — ESMOLOL HCL 100 MG/10ML IV SOLN
INTRAVENOUS | Status: AC
  Filled 2016-04-12: qty 10

## 2016-04-12 MED ORDER — CELECOXIB 200 MG PO CAPS
200.0000 mg | ORAL_CAPSULE | ORAL | Status: AC
  Administered 2016-04-12: 200 mg via ORAL
  Filled 2016-04-12: qty 1

## 2016-04-12 MED ORDER — FENTANYL CITRATE (PF) 100 MCG/2ML IJ SOLN
INTRAMUSCULAR | Status: AC
  Filled 2016-04-12: qty 2

## 2016-04-12 MED ORDER — MORPHINE SULFATE (PF) 2 MG/ML IV SOLN
2.0000 mg | INTRAVENOUS | Status: DC | PRN
  Administered 2016-04-12 (×2): 1 mg via INTRAVENOUS
  Administered 2016-04-12: 2 mg via INTRAVENOUS
  Filled 2016-04-12 (×3): qty 1

## 2016-04-12 MED ORDER — ACETAMINOPHEN 650 MG RE SUPP: 650.0000 mg | Freq: Four times a day (QID) | RECTAL | Status: DC | PRN

## 2016-04-12 MED ORDER — MIDAZOLAM HCL 2 MG/2ML IJ SOLN
INTRAMUSCULAR | Status: AC
  Administered 2016-04-12: 2 mg
  Filled 2016-04-12: qty 2

## 2016-04-12 MED ORDER — HYDROMORPHONE HCL 1 MG/ML IJ SOLN
0.2500 mg | INTRAMUSCULAR | Status: DC | PRN
  Administered 2016-04-12: 0.5 mg via INTRAVENOUS
  Administered 2016-04-12 (×2): 0.25 mg via INTRAVENOUS

## 2016-04-12 MED ORDER — SODIUM CHLORIDE 0.9 % IV SOLN
INTRAVENOUS | Status: DC
  Administered 2016-04-12: 16:00:00 via INTRAVENOUS

## 2016-04-12 MED ORDER — SIMETHICONE 80 MG PO CHEW: 40.0000 mg | CHEWABLE_TABLET | Freq: Four times a day (QID) | ORAL | Status: DC | PRN

## 2016-04-12 MED ORDER — ARTIFICIAL TEARS OP OINT
TOPICAL_OINTMENT | OPHTHALMIC | Status: DC | PRN
  Administered 2016-04-12: 1 via OPHTHALMIC

## 2016-04-12 MED ORDER — ONDANSETRON HCL 4 MG/2ML IJ SOLN
INTRAMUSCULAR | Status: DC | PRN
  Administered 2016-04-12: 4 mg via INTRAVENOUS

## 2016-04-12 MED ORDER — FENTANYL CITRATE (PF) 100 MCG/2ML IJ SOLN: 50.0000 ug | Freq: Once | INTRAMUSCULAR | Status: DC

## 2016-04-12 MED ORDER — CEFAZOLIN SODIUM-DEXTROSE 2-4 GM/100ML-% IV SOLN
2.0000 g | INTRAVENOUS | Status: AC
  Administered 2016-04-12: 2 g via INTRAVENOUS
  Filled 2016-04-12: qty 100

## 2016-04-12 MED ORDER — PROPOFOL 10 MG/ML IV BOLUS
INTRAVENOUS | Status: AC
  Filled 2016-04-12: qty 20

## 2016-04-12 MED ORDER — ESMOLOL HCL 100 MG/10ML IV SOLN
INTRAVENOUS | Status: DC | PRN
  Administered 2016-04-12: 40 mg via INTRAVENOUS

## 2016-04-12 MED ORDER — CHLORHEXIDINE GLUCONATE CLOTH 2 % EX PADS: 6.0000 | MEDICATED_PAD | Freq: Once | CUTANEOUS | Status: DC

## 2016-04-12 MED ORDER — 0.9 % SODIUM CHLORIDE (POUR BTL) OPTIME
TOPICAL | Status: DC | PRN
  Administered 2016-04-12: 1000 mL

## 2016-04-12 MED ORDER — SODIUM CHLORIDE 0.9 % IJ SOLN
INTRAMUSCULAR | Status: AC
  Filled 2016-04-12: qty 10

## 2016-04-12 MED ORDER — FENTANYL CITRATE (PF) 100 MCG/2ML IJ SOLN
INTRAMUSCULAR | Status: DC | PRN
  Administered 2016-04-12 (×4): 50 ug via INTRAVENOUS

## 2016-04-12 MED ORDER — EPHEDRINE SULFATE 50 MG/ML IJ SOLN
INTRAMUSCULAR | Status: DC | PRN
  Administered 2016-04-12: 10 mg via INTRAVENOUS
  Administered 2016-04-12: 5 mg via INTRAVENOUS
  Administered 2016-04-12: 10 mg via INTRAVENOUS
  Administered 2016-04-12 (×3): 5 mg via INTRAVENOUS

## 2016-04-12 MED ORDER — DEXAMETHASONE SODIUM PHOSPHATE 10 MG/ML IJ SOLN
INTRAMUSCULAR | Status: DC | PRN
  Administered 2016-04-12: 10 mg via INTRAVENOUS

## 2016-04-12 MED ORDER — ESCITALOPRAM OXALATE 20 MG PO TABS
20.0000 mg | ORAL_TABLET | Freq: Every day | ORAL | Status: DC
  Administered 2016-04-13: 20 mg via ORAL
  Filled 2016-04-12: qty 1

## 2016-04-12 MED ORDER — LOSARTAN POTASSIUM 50 MG PO TABS
100.0000 mg | ORAL_TABLET | Freq: Every day | ORAL | Status: DC
  Filled 2016-04-12: qty 2

## 2016-04-12 MED ORDER — CLONIDINE HCL 0.2 MG PO TABS
0.2000 mg | ORAL_TABLET | Freq: Every day | ORAL | Status: DC
  Administered 2016-04-12 – 2016-04-13 (×2): 0.2 mg via ORAL
  Filled 2016-04-12 (×2): qty 1

## 2016-04-12 MED ORDER — ONDANSETRON HCL 4 MG/2ML IJ SOLN: 4.0000 mg | Freq: Four times a day (QID) | INTRAMUSCULAR | Status: DC | PRN

## 2016-04-12 MED ORDER — ROCURONIUM BROMIDE 50 MG/5ML IV SOSY
PREFILLED_SYRINGE | INTRAVENOUS | Status: AC
  Filled 2016-04-12: qty 5

## 2016-04-12 MED ORDER — DEXAMETHASONE SODIUM PHOSPHATE 10 MG/ML IJ SOLN
INTRAMUSCULAR | Status: AC
  Filled 2016-04-12: qty 1

## 2016-04-12 MED ORDER — OXYCODONE HCL 5 MG PO TABS
5.0000 mg | ORAL_TABLET | ORAL | Status: DC | PRN
  Administered 2016-04-12: 5 mg via ORAL
  Administered 2016-04-12 – 2016-04-14 (×7): 10 mg via ORAL
  Filled 2016-04-12 (×7): qty 2

## 2016-04-12 MED ORDER — HEMOSTATIC AGENTS (NO CHARGE) OPTIME
TOPICAL | Status: DC | PRN
  Administered 2016-04-12: 1 via TOPICAL

## 2016-04-12 MED ORDER — LACTATED RINGERS IV SOLN
INTRAVENOUS | Status: DC
  Administered 2016-04-12 (×2): via INTRAVENOUS

## 2016-04-12 MED ORDER — LIDOCAINE 2% (20 MG/ML) 5 ML SYRINGE
INTRAMUSCULAR | Status: AC
  Filled 2016-04-12: qty 5

## 2016-04-12 MED ORDER — PROPOFOL 10 MG/ML IV BOLUS
INTRAVENOUS | Status: DC | PRN
  Administered 2016-04-12: 150 mg via INTRAVENOUS
  Administered 2016-04-12: 30 mg via INTRAVENOUS

## 2016-04-12 MED ORDER — VENLAFAXINE HCL ER 75 MG PO CP24
75.0000 mg | ORAL_CAPSULE | Freq: Every day | ORAL | Status: DC
  Administered 2016-04-13: 75 mg via ORAL
  Filled 2016-04-12: qty 1

## 2016-04-12 MED ORDER — MIDAZOLAM HCL 5 MG/ML IJ SOLN: 2.0000 mg | Freq: Once | INTRAMUSCULAR | Status: DC

## 2016-04-12 MED ORDER — FENTANYL CITRATE (PF) 100 MCG/2ML IJ SOLN
INTRAMUSCULAR | Status: AC
  Filled 2016-04-12: qty 4

## 2016-04-12 MED ORDER — FENTANYL CITRATE (PF) 100 MCG/2ML IJ SOLN
100.0000 ug | Freq: Once | INTRAMUSCULAR | Status: AC
  Administered 2016-04-12: 100 ug via INTRAVENOUS

## 2016-04-12 MED ORDER — ROCURONIUM BROMIDE 100 MG/10ML IV SOLN
INTRAVENOUS | Status: DC | PRN
  Administered 2016-04-12: 50 mg via INTRAVENOUS

## 2016-04-12 MED ORDER — ACETAMINOPHEN 325 MG PO TABS: 650.0000 mg | ORAL_TABLET | Freq: Four times a day (QID) | ORAL | Status: DC | PRN

## 2016-04-12 MED ORDER — ARTIFICIAL TEARS OP OINT
TOPICAL_OINTMENT | OPHTHALMIC | Status: AC
  Filled 2016-04-12: qty 3.5

## 2016-04-12 MED ORDER — LIDOCAINE HCL (CARDIAC) 20 MG/ML IV SOLN
INTRAVENOUS | Status: DC | PRN
  Administered 2016-04-12: 60 mg via INTRAVENOUS

## 2016-04-12 MED ORDER — SUGAMMADEX SODIUM 200 MG/2ML IV SOLN
INTRAVENOUS | Status: DC | PRN
  Administered 2016-04-12: 150 mg via INTRAVENOUS

## 2016-04-12 MED ORDER — MIDAZOLAM HCL 5 MG/5ML IJ SOLN
INTRAMUSCULAR | Status: DC | PRN
  Administered 2016-04-12: 1 mg via INTRAVENOUS

## 2016-04-12 MED ORDER — MONTELUKAST SODIUM 10 MG PO TABS
10.0000 mg | ORAL_TABLET | Freq: Every day | ORAL | Status: DC
  Administered 2016-04-13: 10 mg via ORAL
  Filled 2016-04-12: qty 1

## 2016-04-12 MED ORDER — ONDANSETRON 4 MG PO TBDP: 4.0000 mg | ORAL_TABLET | Freq: Four times a day (QID) | ORAL | Status: DC | PRN

## 2016-04-12 MED ORDER — MIDAZOLAM HCL 2 MG/2ML IJ SOLN
INTRAMUSCULAR | Status: AC
  Filled 2016-04-12: qty 2

## 2016-04-12 SURGICAL SUPPLY — 54 items
APPLIER CLIP 9.375 MED OPEN (MISCELLANEOUS)
BINDER BREAST LRG (GAUZE/BANDAGES/DRESSINGS) ×2 IMPLANT
BINDER BREAST XLRG (GAUZE/BANDAGES/DRESSINGS) IMPLANT
BIOPATCH BLUE 3/4IN DISK W/1.5 (GAUZE/BANDAGES/DRESSINGS) ×2 IMPLANT
CANISTER SUCT 3000ML PPV (MISCELLANEOUS) ×2 IMPLANT
CHLORAPREP W/TINT 26ML (MISCELLANEOUS) ×2 IMPLANT
CLIP APPLIE 9.375 MED OPEN (MISCELLANEOUS) IMPLANT
CONT SPEC 4OZ CLIKSEAL STRL BL (MISCELLANEOUS) ×2 IMPLANT
COVER PROBE W GEL 5X96 (DRAPES) ×2 IMPLANT
COVER SURGICAL LIGHT HANDLE (MISCELLANEOUS) ×2 IMPLANT
DERMABOND ADHESIVE PROPEN (GAUZE/BANDAGES/DRESSINGS) ×2
DERMABOND ADVANCED (GAUZE/BANDAGES/DRESSINGS) ×2
DERMABOND ADVANCED .7 DNX12 (GAUZE/BANDAGES/DRESSINGS) ×2 IMPLANT
DERMABOND ADVANCED .7 DNX6 (GAUZE/BANDAGES/DRESSINGS) ×2 IMPLANT
DRAIN CHANNEL 19F RND (DRAIN) ×2 IMPLANT
DRAPE LAPAROSCOPIC ABDOMINAL (DRAPES) ×2 IMPLANT
DRSG TEGADERM 4X4.75 (GAUZE/BANDAGES/DRESSINGS) ×2 IMPLANT
ELECT BLADE 4.0 EZ CLEAN MEGAD (MISCELLANEOUS) ×2
ELECT CAUTERY BLADE 6.4 (BLADE) ×2 IMPLANT
ELECT REM PT RETURN 9FT ADLT (ELECTROSURGICAL) ×2
ELECTRODE BLDE 4.0 EZ CLN MEGD (MISCELLANEOUS) ×1 IMPLANT
ELECTRODE REM PT RTRN 9FT ADLT (ELECTROSURGICAL) ×1 IMPLANT
EVACUATOR SILICONE 100CC (DRAIN) ×2 IMPLANT
GAUZE SPONGE 4X4 12PLY STRL (GAUZE/BANDAGES/DRESSINGS) ×2 IMPLANT
GLOVE BIO SURGEON STRL SZ7 (GLOVE) ×2 IMPLANT
GLOVE BIOGEL PI IND STRL 7.5 (GLOVE) ×1 IMPLANT
GLOVE BIOGEL PI INDICATOR 7.5 (GLOVE) ×1
GOWN STRL REUS W/ TWL LRG LVL3 (GOWN DISPOSABLE) ×3 IMPLANT
GOWN STRL REUS W/TWL LRG LVL3 (GOWN DISPOSABLE) ×3
HEMOSTAT ARISTA ABSORB 3G PWDR (MISCELLANEOUS) ×2 IMPLANT
KIT BASIN OR (CUSTOM PROCEDURE TRAY) ×2 IMPLANT
KIT ROOM TURNOVER OR (KITS) ×2 IMPLANT
MARKER SKIN DUAL TIP RULER LAB (MISCELLANEOUS) ×2 IMPLANT
NEEDLE 18GX1X1/2 (RX/OR ONLY) (NEEDLE) IMPLANT
NEEDLE FILTER BLUNT 18X 1/2SAF (NEEDLE)
NEEDLE FILTER BLUNT 18X1 1/2 (NEEDLE) IMPLANT
NEEDLE HYPO 25GX1X1/2 BEV (NEEDLE) IMPLANT
NS IRRIG 1000ML POUR BTL (IV SOLUTION) ×2 IMPLANT
PACK GENERAL/GYN (CUSTOM PROCEDURE TRAY) ×2 IMPLANT
PAD ARMBOARD 7.5X6 YLW CONV (MISCELLANEOUS) ×2 IMPLANT
PIN SAFETY STERILE (MISCELLANEOUS) ×2 IMPLANT
SPECIMEN JAR X LARGE (MISCELLANEOUS) ×2 IMPLANT
STAPLER VISISTAT 35W (STAPLE) ×2 IMPLANT
STRIP CLOSURE SKIN 1/2X4 (GAUZE/BANDAGES/DRESSINGS) ×2 IMPLANT
SUT ETHILON 2 0 FS 18 (SUTURE) ×2 IMPLANT
SUT MON AB 4-0 PC3 18 (SUTURE) ×6 IMPLANT
SUT SILK 2 0 SH (SUTURE) IMPLANT
SUT VIC AB 3-0 54X BRD REEL (SUTURE) ×1 IMPLANT
SUT VIC AB 3-0 BRD 54 (SUTURE) ×1
SUT VIC AB 3-0 SH 18 (SUTURE) ×2 IMPLANT
SUT VIC AB 3-0 SH 8-18 (SUTURE) ×4 IMPLANT
SYR CONTROL 10ML LL (SYRINGE) IMPLANT
TOWEL OR 17X24 6PK STRL BLUE (TOWEL DISPOSABLE) ×2 IMPLANT
TOWEL OR 17X26 10 PK STRL BLUE (TOWEL DISPOSABLE) ×2 IMPLANT

## 2016-04-12 NOTE — H&P (Signed)
88 yof referred by [name] for newly diagnosed right breast cancer. She has family history of breast cancer in 2 cousins as well as multiple relatives with ovarian cancer (some of which she says have had genetic testing but do not have results today). she had no mass or dc. she underwent screening mm that shows c density breasts. she has distortion in right breast. on [place] there is a 1.7x1.4x1.3 cm mass and an axillary node with abnormal cortex. node biopsy is negative and was node. 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 she has now undergone genetic testing that shows a brca 1 abnormality. she has seen Dr Iran Planas of plastic surgery earlier today to discuss options.   Past Surgical History Breast Biopsy  Right.  Diagnostic Studies History  Colonoscopy  within last year Mammogram  within last year Pap Smear  1-5 years ago  Medication History  Medications Reconciled  Social History  Alcohol use  Moderate alcohol use. Caffeine use  Coffee, Tea. Illicit drug use  Remotely quit drug use. Tobacco use  Former smoker.  Family History  Alcohol Abuse  Father. Arthritis  Family Members In General, Mother. Breast Cancer  Family Members In General. Cervical Cancer  Family Members In General. Heart Disease  Father. Ovarian Cancer  Family Members In General. Prostate Cancer  Father.  Pregnancy / Birth History  Age at menarche  31 years. Age of menopause  31-50 Contraceptive History  Oral contraceptives.  Other Problems  Asthma  High blood pressure  Lump In Breast   Review of Systems General Not Present- Appetite Loss, Chills, Fatigue, Fever, Night Sweats, Weight Gain and Weight Loss. Skin Present- Dryness. Not Present- Change in Wart/Mole, Hives, Jaundice, New Lesions, Non-Healing Wounds, Rash and Ulcer. HEENT Present- Seasonal Allergies and Wears glasses/contact lenses. Not Present- Earache, Hearing Loss,  Hoarseness, Nose Bleed, Oral Ulcers, Ringing in the Ears, Sinus Pain, Sore Throat, Visual Disturbances and Yellow Eyes. Respiratory Present- Snoring. Not Present- Bloody sputum, Chronic Cough, Difficulty Breathing and Wheezing. Breast Present- Breast Mass. Not Present- Breast Pain, Nipple Discharge and Skin Changes. Cardiovascular Not Present- Chest Pain, Difficulty Breathing Lying Down, Leg Cramps, Palpitations, Rapid Heart Rate, Shortness of Breath and Swelling of Extremities. Gastrointestinal Not Present- Abdominal Pain, Bloating, Bloody Stool, Change in Bowel Habits, Chronic diarrhea, Constipation, Difficulty Swallowing, Excessive gas, Gets full quickly at meals, Hemorrhoids, Indigestion, Nausea, Rectal Pain and Vomiting. Female Genitourinary Not Present- Frequency, Nocturia, Painful Urination, Pelvic Pain and Urgency. Musculoskeletal Not Present- Back Pain, Joint Pain, Joint Stiffness, Muscle Pain, Muscle Weakness and Swelling of Extremities. Neurological Not Present- Decreased Memory, Fainting, Headaches, Numbness, Seizures, Tingling, Tremor, Trouble walking and Weakness. Psychiatric Not Present- Anxiety, Bipolar, Change in Sleep Pattern, Depression, Fearful and Frequent crying. Endocrine Present- Hot flashes. Not Present- Cold Intolerance, Excessive Hunger, Hair Changes, Heat Intolerance and New Diabetes. Hematology Present- Blood Thinners. Not Present- Easy Bruising, Excessive bleeding, Gland problems, HIV and Persistent Infections.   Physical Exam  General Mental Status-Alert. Orientation-Oriented X3. Eye Sclera/Conjunctiva - Bilateral-No scleral icterus. Chest and Lung Exam Chest and lung exam reveals -on auscultation, normal breath sounds, no adventitious sounds and normal vocal resonance. Breast Nipples-No Discharge. Breast Lump-No Palpable Breast Mass. Abdomen Note: soft nt Lymphatic Head & Neck General Head & Neck Lymphatics: Bilateral - Description -  Normal. Axillary General Axillary Region: Bilateral - Description - Normal. 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 I recommended mri and US ovaries for screening. we discussed at length possible surgeries which still include lump/sn then following with mri/mm/exams. due to age I dont think mandatory to pursue bilateral mastectomies as risk of contralateral cancer and recurrence will be decreased. she does want to consider bilateral mastectomies though. she likely does not want to pursue reconstruction. we discussed that option today and reasons for reconstruction. her goal is to have surgery and resume life and doesnt really care about reconstruction and that recovery at this point. we discussed bilateral total mastectomies with issues that can arise as well as right ax sn. she and her husband are going to consider options and call me tomorrow.

## 2016-04-12 NOTE — Transfer of Care (Signed)
Immediate Anesthesia Transfer of Care Note  Patient: Chelsea Salinas  Procedure(s) Performed: Procedure(s): BILATERAL TOTAL MASTECTOMIES  WITH RIGHT SENTINEL LYMPH NODE BIOPSY (Bilateral)  Patient Location: PACU  Anesthesia Type:GA combined with regional for post-op pain  Level of Consciousness: awake, alert , oriented and patient cooperative  Airway & Oxygen Therapy: Patient Spontanous Breathing and Patient connected to nasal cannula oxygen  Post-op Assessment: Report given to RN and Post -op Vital signs reviewed and stable  Post vital signs: Reviewed and stable  Last Vitals:  Vitals:   04/12/16 1702 04/12/16 1705  BP:  120/78  Pulse: 94 91  Resp: 16 16  Temp: 37.1 C     Last Pain:  Vitals:   04/12/16 1702  TempSrc:   PainSc: 3       Patients Stated Pain Goal: 2 ([id number])  Complications: No apparent anesthesia complications

## 2016-04-12 NOTE — Anesthesia Procedure Notes (Signed)
Procedure Name: Intubation Date/Time: 04/12/2016 1:44 PM Performed by: Suzy Bouchard Pre-anesthesia Checklist: Emergency Drugs available, Suction available, Patient being monitored, Timeout performed and Patient identified Patient Re-evaluated:Patient Re-evaluated prior to inductionOxygen Delivery Method: Circle system utilized Preoxygenation: Pre-oxygenation with 100% oxygen Intubation Type: IV induction Ventilation: Mask ventilation without difficulty Laryngoscope Size: Miller and 2 Tube type: Oral Tube size: 7.0 mm Number of attempts: 1 Airway Equipment and Method: Stylet Placement Confirmation: ETT inserted through vocal cords under direct vision and CO2 detector Secured at: 22 cm Dental Injury: Teeth and Oropharynx as per pre-operative assessment

## 2016-04-12 NOTE — Interval H&P Note (Signed)
History and Physical Interval Note:  04/12/2016 1:22 PM  Chelsea Salinas  has presented today for surgery, with the diagnosis of RIGHT BREAST CANCER, BRCA MUTATION  The various methods of treatment have been discussed with the patient and family. After consideration of risks, benefits and other options for treatment, the patient has consented to  Procedure(s): BILATERAL TOTAL MASTECTOMIES  WITH RIGHT SENTINEL LYMPH NODE BIOPSY (Bilateral) as a surgical intervention .  The patient's history has been reviewed, patient examined, no change in status, stable for surgery.  I have reviewed the patient's chart and labs.  Questions were answered to the patient's satisfaction.     [name]

## 2016-04-12 NOTE — Progress Notes (Signed)
Received to room 6n21 from PACU. Denies nausea/pain at this time, family at bedside, oriented to room and surroundings

## 2016-04-12 NOTE — Anesthesia Postprocedure Evaluation (Signed)
Anesthesia Post Note  Patient: Chelsea Salinas  Procedure(s) Performed: Procedure(s) (LRB): BILATERAL TOTAL MASTECTOMIES  WITH RIGHT SENTINEL LYMPH NODE BIOPSY (Bilateral)  Patient location during evaluation: PACU Anesthesia Type: General and Regional Level of consciousness: awake and alert Pain management: pain level controlled Vital Signs Assessment: post-procedure vital signs reviewed and stable Respiratory status: spontaneous breathing, nonlabored ventilation, respiratory function stable and patient connected to nasal cannula oxygen Cardiovascular status: blood pressure returned to baseline and stable Postop Assessment: no signs of nausea or vomiting Anesthetic complications: no       Last Vitals:  Vitals:   04/12/16 1702 04/12/16 1705  BP:  120/78  Pulse: 94 91  Resp: 16 16  Temp: 37.1 C     Last Pain:  Vitals:   04/12/16 1702  TempSrc:   PainSc: 3                  Markice Torbert,W. EDMOND

## 2016-04-12 NOTE — Discharge Instructions (Signed)
[facility] °[phone] ° °MASTECTOMY: POST OP INSTRUCTIONS ° °Always review your discharge instruction sheet given to you by the facility where your surgery was performed. °IF YOU HAVE DISABILITY OR FAMILY LEAVE FORMS, YOU MUST BRING THEM TO THE OFFICE FOR PROCESSING.   °DO NOT GIVE THEM TO YOUR DOCTOR. °A prescription for pain medication may be given to you upon discharge.  Take your pain medication as prescribed, if needed.  If narcotic pain medicine is not needed, then you may take acetaminophen (Tylenol), naprosyn (Alleve) or ibuprofen (Advil) as needed. °1. Take your usually prescribed medications unless otherwise directed. °2. If you need a refill on your pain medication, please contact your pharmacy.  They will contact our office to request authorization.  Prescriptions will not be filled after 5pm or on week-ends. °3. You should follow a light diet the first few days after arrival home, such as soup and crackers, etc.  Resume your normal diet the day after surgery. °4. Most patients will experience some swelling and bruising on the chest and underarm.  Ice packs will help.  Swelling and bruising can take several days to resolve. Wear the binder day and night until you return to the office.  °5. It is common to experience some constipation if taking pain medication after surgery.  Increasing fluid intake and taking a stool softener (such as Colace) will usually help or prevent this problem from occurring.  A mild laxative (Milk of Magnesia or Miralax) should be taken according to package instructions if there are no bowel movements after 48 hours. °6. Unless discharge instructions indicate otherwise, leave your bandage dry and in place until your next appointment in 3-5 days.  You may take a limited sponge bath.  No tube baths or showers until the drains are removed.  You may have steri-strips (small skin tapes) in place directly over the incision.  These strips should be left on the  skin for 7-10 days. If you have glue it will come off in next couple week.  Any sutures will be removed at an office visit °7. DRAINS:  If you have drains in place, it is important to keep a list of the amount of drainage produced each day in your drains.  Before leaving the hospital, you should be instructed on drain care.  Call our office if you have any questions about your drains. I will remove your drains when they put out less than 30 cc or ml for 2 consecutive days. °8. ACTIVITIES:  You may resume regular (light) daily activities beginning the next day--such as daily self-care, walking, climbing stairs--gradually increasing activities as tolerated.  You may have sexual intercourse when it is comfortable.  Refrain from any heavy lifting or straining until approved by your doctor. °a. You may drive when you are no longer taking prescription pain medication, you can comfortably wear a seatbelt, and you can safely maneuver your car and apply brakes. °b. RETURN TO WORK:  __________________________________________________________ °9. You should see your doctor in the office for a follow-up appointment approximately 3-5 days after your surgery.  Your doctor’s nurse will typically make your follow-up appointment when she calls you with your pathology report.  Expect your pathology report 3-4business days after surgery. °10. OTHER INSTRUCTIONS: ______________________________________________________________________________________________ ____________________________________________________________________________________________ °WHEN TO CALL YOUR DR Valery Amedee: °1. Fever over 101.0 °2. Nausea and/or vomiting °3. Extreme swelling or bruising °4. Continued bleeding from incision. °5. Increased pain, redness, or drainage from the incision. °The clinic staff is available   to answer your questions during regular business hours.  Please don’t hesitate to call and ask to speak to one of the nurses for clinical concerns.  If  you have a medical emergency, go to the nearest emergency room or call 911.  A surgeon from [facility] is always on call at the hospital. °[address], [address], [place], [place]  [address] ? [address] [address], [place], [place]   [address] °[phone] ? [phone] ? FAX [phone] °Web site: [email or web address] ° °

## 2016-04-12 NOTE — Anesthesia Preprocedure Evaluation (Addendum)
Anesthesia Evaluation  Patient identified by MRN, date of birth, ID band Patient awake    Reviewed: Allergy & Precautions, H&P , NPO status , Patient's Chart, lab work & pertinent test results  Airway Mallampati: II  TM Distance: >3 FB Neck ROM: Full    Dental no notable dental hx. (+) Teeth Intact, Dental Advisory Given   Pulmonary asthma , former smoker,    Pulmonary exam normal breath sounds clear to auscultation       Cardiovascular hypertension, Pt. on medications  Rhythm:Regular Rate:Normal     Neuro/Psych negative neurological ROS  negative psych ROS   GI/Hepatic negative GI ROS, Neg liver ROS,   Endo/Other  negative endocrine ROS  Renal/GU negative Renal ROS  negative genitourinary   Musculoskeletal   Abdominal   Peds  Hematology negative hematology ROS (+)   Anesthesia Other Findings   Reproductive/Obstetrics negative OB ROS                           Anesthesia Physical Anesthesia Plan  ASA: II  Anesthesia Plan: General   Post-op Pain Management:  Regional for Post-op pain   Induction: Intravenous  Airway Management Planned: LMA and Oral ETT  Additional Equipment:   Intra-op Plan:   Post-operative Plan: Extubation in OR  Informed Consent: I have reviewed the patients History and Physical, chart, labs and discussed the procedure including the risks, benefits and alternatives for the proposed anesthesia with the patient or authorized representative who has indicated his/her understanding and acceptance.   Dental advisory given  Plan Discussed with: CRNA  Anesthesia Plan Comments: (Bilateral PECS block)       Anesthesia Quick Evaluation

## 2016-04-12 NOTE — Op Note (Signed)
Preoperative diagnosis: Clinical stage I right breast cancer, BRCA mutation Postoperative diagnosis: Same as above Procedure: #1 left prophylactic total mastectomy #2 right total mastectomy #3 right axillary sentinel lymph node biopsy Surgeon: Dr. Serita Grammes Assistant: Judyann Munson RNFA Anesthesia: Gen. with bilateral pectoral block Estimate blood loss: 50 mL Drains: 19 [place] Blake drain to either side Complications: None Specimens: #1 left breast tissue marked short stitch superior, long stitch lateral #2 right breast tissue marked short stitch superior, long stitch lateral #3 right axillary sentinel nodes Disposition to recovery in stable condition Sponge and needle count correct at completion  Indications: This is a 61 year old healthy female who had a clinical stage I right breast cancer noted. She underwent genetic testing and this is positive for a BRCA1 mutation. She has been seen by plastic surgery and has elected not to undergo reconstruction. We scheduled her for bilateral mastectomies in the right axilla sentinel lymph node after discussing all of the different options.  Procedure: After informed consent was obtained the patient was taken to the operating room. She was given cefazolin. SCDs were on. She had undergone bilateral pectoral blocks. She then underwent general anesthesia without complication. Her chest was prepped and draped in the standard sterile surgical fashion. A surgical timeout was then performed.  I first did the left mastectomy. I made an elliptical incision that encompassed skin as well as the nipple areolar complex. Flaps were created to the clavicle superiorly, parasternal region medially, inframammary fold inferiorly, and the latissimus laterally. The breast tissue was then rolled up from the pectoralis muscle including the fascia. This was then passed off the table and marked as above. Hemostasis was obtained. I inserted a 19 [place] Blake drain and  secured this with a 2-0 nylon suture. I then closed the  dermis with 3-0 Vicryl. The lateral portion I brought in closed in a Y fashion. I excised some extra skin. I then closed the skin with 4-0 Monocryl. Dermabond and Steri-Strips were then applied.  The right mastectomy was done in a similar fashion. I made an elliptical incision encompassing the nipple areolar complex. Flaps were created. The breast tissue was then removed in a similar fashion. This was marked as above. I then was able to locate what appeared to be a single sentinel node with a count of 102. There was no other radioactivity present. There were no enlarged nodes either. I then obtained hemostasis. I inserted a 53 [place] Blake drain and secured this with a 2-0 nylon suture. I closed this with 3-0 Vicryl for Monocryl in a V-Y plasty fashion just like the other side. Dermabond and Steri-Strips were applied. She tolerated this well was extubated and transferred recovery in stable condition.

## 2016-04-12 NOTE — Anesthesia Procedure Notes (Signed)
Anesthesia Regional Block: Pectoralis block   Pre-Anesthetic Checklist: ,, timeout performed, Correct Patient, Correct Site, Correct Laterality, Correct Procedure, Correct Position, site marked, Risks and benefits discussed, pre-op evaluation,  At surgeon's request and post-op pain management  Laterality: Left and Right  Prep: Maximum Sterile Barrier Precautions used, chloraprep       Needles:  Injection technique: Single-shot  Needle Type: Echogenic Stimulator Needle     Needle Length: 9cm  Needle Gauge: 21     Additional Needles:   Procedures: ultrasound guided,,,,,,,,  Narrative:  Start time: 04/12/2016 12:25 PM End time: 04/12/2016 12:40 PM Injection made incrementally with aspirations every 5 mL. Anesthesiologist: Roderic Palau  Additional Notes: 2% Lidocaine skin wheel. Bilateral PECS block

## 2016-04-13 ENCOUNTER — Encounter (HOSPITAL_COMMUNITY): Payer: Self-pay | Admitting: General Surgery

## 2016-04-13 ENCOUNTER — Observation Stay (HOSPITAL_COMMUNITY): Payer: BLUE CROSS/BLUE SHIELD | Admitting: Certified Registered Nurse Anesthetist

## 2016-04-13 ENCOUNTER — Encounter (HOSPITAL_COMMUNITY)
Admission: RE | Disposition: A | Discharge status: Home / Self Care | Payer: Self-pay | Source: Ambulatory Visit | Attending: General Surgery

## 2016-04-13 DIAGNOSIS — I10 Essential (primary) hypertension: Secondary | ICD-10-CM | POA: Diagnosis not present

## 2016-04-13 DIAGNOSIS — L7632 Postprocedural hematoma of skin and subcutaneous tissue following other procedure: Secondary | ICD-10-CM | POA: Diagnosis not present

## 2016-04-13 DIAGNOSIS — S2001XA Contusion of right breast, initial encounter: Secondary | ICD-10-CM | POA: Diagnosis not present

## 2016-04-13 DIAGNOSIS — Z8041 Family history of malignant neoplasm of ovary: Secondary | ICD-10-CM | POA: Diagnosis not present

## 2016-04-13 DIAGNOSIS — C50911 Malignant neoplasm of unspecified site of right female breast: Secondary | ICD-10-CM | POA: Diagnosis not present

## 2016-04-13 DIAGNOSIS — Z803 Family history of malignant neoplasm of breast: Secondary | ICD-10-CM | POA: Diagnosis not present

## 2016-04-13 DIAGNOSIS — D242 Benign neoplasm of left breast: Secondary | ICD-10-CM | POA: Diagnosis not present

## 2016-04-13 DIAGNOSIS — Z87891 Personal history of nicotine dependence: Secondary | ICD-10-CM | POA: Diagnosis not present

## 2016-04-13 DIAGNOSIS — Z79899 Other long term (current) drug therapy: Secondary | ICD-10-CM | POA: Diagnosis not present

## 2016-04-13 DIAGNOSIS — Z4001 Encounter for prophylactic removal of breast: Secondary | ICD-10-CM | POA: Diagnosis not present

## 2016-04-13 DIAGNOSIS — N6092 Unspecified benign mammary dysplasia of left breast: Secondary | ICD-10-CM | POA: Diagnosis not present

## 2016-04-13 DIAGNOSIS — Z1501 Genetic susceptibility to malignant neoplasm of breast: Secondary | ICD-10-CM | POA: Diagnosis not present

## 2016-04-13 DIAGNOSIS — J45909 Unspecified asthma, uncomplicated: Secondary | ICD-10-CM | POA: Diagnosis not present

## 2016-04-13 DIAGNOSIS — D649 Anemia, unspecified: Secondary | ICD-10-CM | POA: Diagnosis not present

## 2016-04-13 HISTORY — PX: BREAST BIOPSY: SHX20

## 2016-04-13 LAB — SURGICAL PCR SCREEN
MRSA, PCR: NEGATIVE
Staphylococcus aureus: POSITIVE — AB

## 2016-04-13 LAB — CBC
HEMATOCRIT: 27.1 % — AB (ref 36.0–46.0)
Hemoglobin: 9 g/dL — ABNORMAL LOW (ref 12.0–15.0)
MCH: 32.3 pg (ref 26.0–34.0)
MCHC: 33.2 g/dL (ref 30.0–36.0)
MCV: 97.1 fL (ref 78.0–100.0)
Platelets: 197 10*3/uL (ref 150–400)
RBC: 2.79 MIL/uL — ABNORMAL LOW (ref 3.87–5.11)
RDW: 13 % (ref 11.5–15.5)
WBC: 8.8 10*3/uL (ref 4.0–10.5)

## 2016-04-13 SURGERY — BREAST BIOPSY
Anesthesia: General | Laterality: Right

## 2016-04-13 MED ORDER — ROCURONIUM BROMIDE 50 MG/5ML IV SOSY
PREFILLED_SYRINGE | INTRAVENOUS | Status: AC
  Filled 2016-04-13: qty 5

## 2016-04-13 MED ORDER — CEFAZOLIN SODIUM-DEXTROSE 2-4 GM/100ML-% IV SOLN
INTRAVENOUS | Status: AC
  Filled 2016-04-13: qty 100

## 2016-04-13 MED ORDER — HYDROMORPHONE HCL 1 MG/ML IJ SOLN
INTRAMUSCULAR | Status: AC
  Filled 2016-04-13: qty 0.5

## 2016-04-13 MED ORDER — ROCURONIUM BROMIDE 100 MG/10ML IV SOLN
INTRAVENOUS | Status: DC | PRN
  Administered 2016-04-13: 30 mg via INTRAVENOUS

## 2016-04-13 MED ORDER — BUPIVACAINE HCL (PF) 0.25 % IJ SOLN
INTRAMUSCULAR | Status: AC
  Filled 2016-04-13: qty 30

## 2016-04-13 MED ORDER — SUGAMMADEX SODIUM 200 MG/2ML IV SOLN
INTRAVENOUS | Status: AC
  Filled 2016-04-13: qty 2

## 2016-04-13 MED ORDER — PROMETHAZINE HCL 25 MG/ML IJ SOLN: 6.2500 mg | INTRAMUSCULAR | Status: DC | PRN

## 2016-04-13 MED ORDER — OXYCODONE HCL 5 MG PO TABS
ORAL_TABLET | ORAL | Status: AC
  Filled 2016-04-13: qty 2

## 2016-04-13 MED ORDER — KETOROLAC TROMETHAMINE 30 MG/ML IJ SOLN: 30.0000 mg | Freq: Once | INTRAMUSCULAR | Status: DC

## 2016-04-13 MED ORDER — ONDANSETRON HCL 4 MG/2ML IJ SOLN
INTRAMUSCULAR | Status: DC | PRN
  Administered 2016-04-13: 4 mg via INTRAVENOUS

## 2016-04-13 MED ORDER — SUCCINYLCHOLINE CHLORIDE 200 MG/10ML IV SOSY
PREFILLED_SYRINGE | INTRAVENOUS | Status: DC | PRN
  Administered 2016-04-13: 100 mg via INTRAVENOUS

## 2016-04-13 MED ORDER — DEXAMETHASONE SODIUM PHOSPHATE 10 MG/ML IJ SOLN
INTRAMUSCULAR | Status: DC | PRN
  Administered 2016-04-13: 10 mg via INTRAVENOUS

## 2016-04-13 MED ORDER — LIDOCAINE 2% (20 MG/ML) 5 ML SYRINGE
INTRAMUSCULAR | Status: AC
Start: 2016-04-13 — End: 2016-04-13
  Filled 2016-04-13: qty 5

## 2016-04-13 MED ORDER — FENTANYL CITRATE (PF) 100 MCG/2ML IJ SOLN
INTRAMUSCULAR | Status: DC | PRN
  Administered 2016-04-13 (×2): 25 ug via INTRAVENOUS
  Administered 2016-04-13: 100 ug via INTRAVENOUS
  Administered 2016-04-13: 50 ug via INTRAVENOUS

## 2016-04-13 MED ORDER — ONDANSETRON HCL 4 MG/2ML IJ SOLN
INTRAMUSCULAR | Status: AC
  Filled 2016-04-13: qty 2

## 2016-04-13 MED ORDER — FENTANYL CITRATE (PF) 100 MCG/2ML IJ SOLN
INTRAMUSCULAR | Status: AC
  Filled 2016-04-13: qty 2

## 2016-04-13 MED ORDER — LIDOCAINE 2% (20 MG/ML) 5 ML SYRINGE
INTRAMUSCULAR | Status: DC | PRN
  Administered 2016-04-13: 100 mg via INTRAVENOUS

## 2016-04-13 MED ORDER — HYDROMORPHONE HCL 1 MG/ML IJ SOLN
0.2500 mg | INTRAMUSCULAR | Status: DC | PRN
  Administered 2016-04-13: 0.5 mg via INTRAVENOUS
  Administered 2016-04-13: 0.25 mg via INTRAVENOUS
  Administered 2016-04-13: 0.5 mg via INTRAVENOUS
  Administered 2016-04-13: 0.25 mg via INTRAVENOUS

## 2016-04-13 MED ORDER — MUPIROCIN 2 % EX OINT
TOPICAL_OINTMENT | Freq: Two times a day (BID) | CUTANEOUS | Status: DC
  Administered 2016-04-13: 1 via NASAL
  Filled 2016-04-13: qty 22

## 2016-04-13 MED ORDER — SUGAMMADEX SODIUM 200 MG/2ML IV SOLN
INTRAVENOUS | Status: DC | PRN
  Administered 2016-04-13: 138.8 mg via INTRAVENOUS

## 2016-04-13 MED ORDER — LACTATED RINGERS IV SOLN
INTRAVENOUS | Status: DC | PRN
Start: 2016-04-13 — End: 2016-04-13
  Administered 2016-04-13: 15:00:00 via INTRAVENOUS

## 2016-04-13 MED ORDER — MIDAZOLAM HCL 2 MG/2ML IJ SOLN
INTRAMUSCULAR | Status: AC
  Filled 2016-04-13: qty 2

## 2016-04-13 MED ORDER — MIDAZOLAM HCL 2 MG/2ML IJ SOLN
INTRAMUSCULAR | Status: DC | PRN
  Administered 2016-04-13: 2 mg via INTRAVENOUS

## 2016-04-13 MED ORDER — MEPERIDINE HCL 25 MG/ML IJ SOLN: 6.2500 mg | INTRAMUSCULAR | Status: DC | PRN

## 2016-04-13 MED ORDER — PROPOFOL 10 MG/ML IV BOLUS
INTRAVENOUS | Status: DC | PRN
  Administered 2016-04-13: 190 mg via INTRAVENOUS

## 2016-04-13 MED ORDER — DEXMEDETOMIDINE HCL IN NACL 200 MCG/50ML IV SOLN
INTRAVENOUS | Status: AC
  Filled 2016-04-13: qty 50

## 2016-04-13 MED ORDER — DIPHENHYDRAMINE HCL 25 MG PO CAPS
50.0000 mg | ORAL_CAPSULE | Freq: Once | ORAL | Status: AC
  Administered 2016-04-13: 50 mg via ORAL
  Filled 2016-04-13: qty 2

## 2016-04-13 MED ORDER — PROPOFOL 10 MG/ML IV BOLUS
INTRAVENOUS | Status: AC
  Filled 2016-04-13: qty 20

## 2016-04-13 MED ORDER — CEFAZOLIN SODIUM-DEXTROSE 2-3 GM-% IV SOLR
INTRAVENOUS | Status: DC | PRN
  Administered 2016-04-13: 2 g via INTRAVENOUS

## 2016-04-13 MED ORDER — EPINEPHRINE PF 1 MG/ML IJ SOLN
INTRAMUSCULAR | Status: AC
  Filled 2016-04-13: qty 1

## 2016-04-13 MED ORDER — HYDROMORPHONE HCL 1 MG/ML IJ SOLN
INTRAMUSCULAR | Status: AC
  Filled 2016-04-13: qty 1

## 2016-04-13 SURGICAL SUPPLY — 54 items
APPLIER CLIP 9.375 MED OPEN (MISCELLANEOUS)
BINDER BREAST LRG (GAUZE/BANDAGES/DRESSINGS) ×2 IMPLANT
BINDER BREAST XLRG (GAUZE/BANDAGES/DRESSINGS) IMPLANT
BIOPATCH RED 1 DISK 7.0 (GAUZE/BANDAGES/DRESSINGS) ×4 IMPLANT
BLADE CLIPPER SURG (BLADE) IMPLANT
CANISTER SUCT 3000ML PPV (MISCELLANEOUS) IMPLANT
CHLORAPREP W/TINT 26ML (MISCELLANEOUS) ×2 IMPLANT
CLIP APPLIE 9.375 MED OPEN (MISCELLANEOUS) IMPLANT
CONT SPEC STER OR (MISCELLANEOUS) IMPLANT
COVER SURGICAL LIGHT HANDLE (MISCELLANEOUS) ×2 IMPLANT
DECANTER SPIKE VIAL GLASS SM (MISCELLANEOUS) ×2 IMPLANT
DERMABOND ADHESIVE PROPEN (GAUZE/BANDAGES/DRESSINGS) ×1
DERMABOND ADVANCED (GAUZE/BANDAGES/DRESSINGS) ×2
DERMABOND ADVANCED .7 DNX12 (GAUZE/BANDAGES/DRESSINGS) ×2 IMPLANT
DERMABOND ADVANCED .7 DNX6 (GAUZE/BANDAGES/DRESSINGS) ×1 IMPLANT
DRAIN CHANNEL 19F RND (DRAIN) ×4 IMPLANT
DRAPE CHEST BREAST 15X10 FENES (DRAPES) ×2 IMPLANT
DRSG TEGADERM 4X4.75 (GAUZE/BANDAGES/DRESSINGS) ×4 IMPLANT
ELECT BLADE 4.0 EZ CLEAN MEGAD (MISCELLANEOUS) ×2
ELECT CAUTERY BLADE 6.4 (BLADE) ×2 IMPLANT
ELECT REM PT RETURN 9FT ADLT (ELECTROSURGICAL) ×2
ELECTRODE BLDE 4.0 EZ CLN MEGD (MISCELLANEOUS) ×1 IMPLANT
ELECTRODE REM PT RTRN 9FT ADLT (ELECTROSURGICAL) ×1 IMPLANT
EVACUATOR SILICONE 100CC (DRAIN) ×4 IMPLANT
GLOVE BIO SURGEON STRL SZ7 (GLOVE) ×2 IMPLANT
GLOVE BIOGEL PI IND STRL 7.5 (GLOVE) ×1 IMPLANT
GLOVE BIOGEL PI INDICATOR 7.5 (GLOVE) ×1
GOWN STRL REUS W/ TWL LRG LVL3 (GOWN DISPOSABLE) ×2 IMPLANT
GOWN STRL REUS W/TWL LRG LVL3 (GOWN DISPOSABLE) ×2
HEMOSTAT ARISTA ABSORB 3G PWDR (MISCELLANEOUS) ×4 IMPLANT
KIT BASIN OR (CUSTOM PROCEDURE TRAY) ×2 IMPLANT
KIT ROOM TURNOVER OR (KITS) ×2 IMPLANT
NEEDLE HYPO 25GX1X1/2 BEV (NEEDLE) ×2 IMPLANT
NS IRRIG 1000ML POUR BTL (IV SOLUTION) ×2 IMPLANT
PACK SURGICAL SETUP 50X90 (CUSTOM PROCEDURE TRAY) ×2 IMPLANT
PAD ARMBOARD 7.5X6 YLW CONV (MISCELLANEOUS) ×2 IMPLANT
PENCIL BUTTON HOLSTER BLD 10FT (ELECTRODE) ×2 IMPLANT
SPONGE LAP 4X18 X RAY DECT (DISPOSABLE) ×2 IMPLANT
STRIP CLOSURE SKIN 1/2X4 (GAUZE/BANDAGES/DRESSINGS) ×2 IMPLANT
SUT ETHILON 2 0 FS 18 (SUTURE) ×2 IMPLANT
SUT ETHILON 3 0 PS 1 (SUTURE) ×4 IMPLANT
SUT MNCRL AB 4-0 PS2 18 (SUTURE) ×2 IMPLANT
SUT SILK 2 0 SH (SUTURE) IMPLANT
SUT VIC AB 2-0 SH 27 (SUTURE) ×1
SUT VIC AB 2-0 SH 27X BRD (SUTURE) ×1 IMPLANT
SUT VIC AB 3-0 SH 27 (SUTURE) ×1
SUT VIC AB 3-0 SH 27XBRD (SUTURE) ×1 IMPLANT
SUT VIC AB 3-0 SH 8-18 (SUTURE) ×2 IMPLANT
SYR BULB 3OZ (MISCELLANEOUS) ×2 IMPLANT
SYR CONTROL 10ML LL (SYRINGE) ×2 IMPLANT
TOWEL OR 17X24 6PK STRL BLUE (TOWEL DISPOSABLE) ×2 IMPLANT
TOWEL OR 17X26 10 PK STRL BLUE (TOWEL DISPOSABLE) ×2 IMPLANT
TUBE CONNECTING 12X1/4 (SUCTIONS) ×2 IMPLANT
YANKAUER SUCT BULB TIP NO VENT (SUCTIONS) ×2 IMPLANT

## 2016-04-13 NOTE — Progress Notes (Signed)
Patient ID: EMI LYMON, female   DOB: Jun 17, 1955, 61 y.o.   MRN: [id number] She has an increasing left mastectomy hematoma, drainage is decreased.  I dont think that this will resolve but I dont think really bleeding either. I think that this needs to be evacuated though.  I am going to do this today. Discussed possibly another drain.  She will stay until tomorrow

## 2016-04-13 NOTE — Transfer of Care (Signed)
Immediate Anesthesia Transfer of Care Note  Patient: Chelsea Salinas  Procedure(s) Performed: Procedure(s): Evacuation RIGHT Breast  Hematoma (Right)  Patient Location: PACU  Anesthesia Type:General  Level of Consciousness: awake, alert  and oriented  Airway & Oxygen Therapy: Patient Spontanous Breathing and Patient connected to nasal cannula oxygen  Post-op Assessment: Report given to RN, Post -op Vital signs reviewed and stable and Patient moving all extremities  Post vital signs: Reviewed and stable  Last Vitals:  Vitals:   04/13/16 0525 04/13/16 1020  BP: (!) 105/54 113/80  Pulse: 86 83  Resp: 16 17  Temp: 36.9 C 36.7 C    Last Pain:  Vitals:   04/13/16 1020  TempSrc: Oral  PainSc: Asleep      Patients Stated Pain Goal: 2 (35/67/01 4103)  Complications: No apparent anesthesia complications

## 2016-04-13 NOTE — Progress Notes (Signed)
1 Day Post-Op  Subjective: Feels ok no real complaints voiding tol diet  Objective: Vital signs in last 24 hours: Temp:  [97.4 F (36.3 C)-98.7 F (37.1 C)] 98.4 F (36.9 C) (03/07 0525) Pulse Rate:  [77-104] 86 (03/07 0525) Resp:  [10-24] 16 (03/07 0525) BP: (103-199)/(54-120) 105/54 (03/07 0525) SpO2:  [93 %-100 %] 93 % (03/07 0525) Weight:  [69.4 kg (153 lb)] 69.4 kg (153 lb) (03/06 1153)    Intake/Output from previous day: 03/06 0701 - 03/07 0700 In: 2162.5 [P.O.:490; I.V.:1672.5] Out: 400 [Drains:250; Blood:150] Intake/Output this shift: No intake/output data recorded.  The binder did not have straps on it so there is swelling over top on both sides. The left side is without hematoma, drainage serosang the right side has small hematoma under flaps and more bloody drainage.   Lab Results:  No results for input(s): WBC, HGB, HCT, PLT in the last 72 hours. BMET No results for input(s): NA, K, CL, CO2, GLUCOSE, BUN, CREATININE, CALCIUM in the last 72 hours. PT/INR No results for input(s): LABPROT, INR in the last 72 hours. ABG No results for input(s): PHART, HCO3 in the last 72 hours.  Invalid input(s): PCO2, PO2  Studies/Results: No results found.  Anti-infectives: Anti-infectives    Start     Dose/Rate Route Frequency Ordered Stop   04/12/16 1200  ceFAZolin (ANCEF) IVPB 2g/100 mL premix     2 g 200 mL/hr over 30 Minutes Intravenous To Surgery 04/12/16 1147 04/12/16 1342      Assessment/Plan: POD 1 bilateral mastecomties with right ax sn biopsy  Hopefully home later but want to recheck the right side. Hopefully this will stop with compression today. Will check cbc and reeavluate later. Small chance this will need to be evacuated today  [facility] 04/13/2016

## 2016-04-13 NOTE — Op Note (Signed)
Preoperative diagnosis: right mastectomy hematoma Postoperative diagnosis: saa Procedure Evacuation of right mastectomy hematoma Surgeon Dr Serita Grammes EBL: minimal at time of surgery Anesthesia general Specimens none Complications none Drains 2 19 Fr Blake drains to right side Sponge count correct at completion dispo to recovery stable  Indications: This is a 29 yof who underwent bilateral total mastectomies yesterday with right axillary sn biopsy.  She has an expanding right sided hematoma and I discussed evacuation.  Procedure: after informed consent obtained patient taken to OR.  scds were in place.  Ancef was given. She was placed under general anesthesia without complication.  The old drain and steristrips were removed. She was prepped and draped in standard sterile surgical fashion.  A timeout was performed.  I reopened the transverse portion of her incision.  I evacuated clot and some new blood.  I irrigated copiously.  There was not a definitive area that was bleeding.  I cauterized numerous raw areas on the flaps, muscle and in axilla.  This appears hemostatic at completion. I did apply arista to the axilla and to flaps/muscle.  I placed 2 19 Fr Blake drains.  I secured these with 2-0 nylon.  I then closed the incision again with 3-0 vicryl and 4-0 monocryl.  I applied glue and steristrips.  I placed biopatches over the drains and tegaderm.  A binder was placed. She was extubated and transferred to recovery stable.

## 2016-04-13 NOTE — Anesthesia Procedure Notes (Signed)
Procedure Name: Intubation Date/Time: 04/13/2016 3:31 PM Performed by: Trixie Deis A Pre-anesthesia Checklist: Patient identified, Emergency Drugs available, Suction available and Patient being monitored Patient Re-evaluated:Patient Re-evaluated prior to inductionOxygen Delivery Method: Circle System Utilized Preoxygenation: Pre-oxygenation with 100% oxygen Intubation Type: IV induction, Rapid sequence and Cricoid Pressure applied Ventilation: Mask ventilation without difficulty Laryngoscope Size: Mac and 3 Grade View: Grade I Tube type: Oral Tube size: 7.0 mm Number of attempts: 1 Airway Equipment and Method: Stylet and Oral airway Placement Confirmation: ETT inserted through vocal cords under direct vision,  positive ETCO2 and breath sounds checked- equal and bilateral Secured at: 21 cm Tube secured with: Tape Dental Injury: Teeth and Oropharynx as per pre-operative assessment

## 2016-04-13 NOTE — Progress Notes (Signed)
Clarified laterality of hematoma evacuation with [name] of Right Breast.  Confirmed with patient, consent signed.  Pt had scrambled eggs, 2 pieces of bacon, 1 cup fruit, skim milk, and applejuice at 9:30 this morning.  Last clear was "small sips" at 1pm today.  Dr. Jillyn Hidden made aware, CRNA made aware.

## 2016-04-13 NOTE — Anesthesia Preprocedure Evaluation (Addendum)
Anesthesia Evaluation  Patient identified by MRN, date of birth, ID band  Reviewed: Allergy & Precautions, NPO status , Patient's Chart, lab work & pertinent test results  History of Anesthesia Complications Negative for: history of anesthetic complications  Airway Mallampati: I  TM Distance: >3 FB Neck ROM: Full    Dental  (+) Dental Advisory Given, Teeth Intact   Pulmonary asthma , former smoker,    Pulmonary exam normal        Cardiovascular hypertension, Normal cardiovascular exam     Neuro/Psych negative psych ROS   GI/Hepatic negative GI ROS, Neg liver ROS,   Endo/Other  negative endocrine ROS  Renal/GU      Musculoskeletal negative musculoskeletal ROS (+)   Abdominal Normal abdominal exam  (+)   Peds  Hematology  (+) anemia ,   Anesthesia Other Findings   Reproductive/Obstetrics                                                             Anesthesia Evaluation  Patient identified by MRN, date of birth, ID band Patient awake    Reviewed: Allergy & Precautions, H&P , NPO status , Patient's Chart, lab work & pertinent test results  Airway Mallampati: II  TM Distance: >3 FB Neck ROM: Full    Dental no notable dental hx. (+) Teeth Intact, Dental Advisory Given   Pulmonary asthma , former smoker,    Pulmonary exam normal breath sounds clear to auscultation       Cardiovascular hypertension, Pt. on medications  Rhythm:Regular Rate:Normal     Neuro/Psych negative neurological ROS  negative psych ROS   GI/Hepatic negative GI ROS, Neg liver ROS,   Endo/Other  negative endocrine ROS  Renal/GU negative Renal ROS  negative genitourinary   Musculoskeletal   Abdominal   Peds  Hematology negative hematology ROS (+)   Anesthesia Other Findings   Reproductive/Obstetrics negative OB ROS                           Anesthesia  Physical Anesthesia Plan  ASA: II  Anesthesia Plan: General   Post-op Pain Management:  Regional for Post-op pain   Induction: Intravenous  Airway Management Planned: LMA and Oral ETT  Additional Equipment:   Intra-op Plan:   Post-operative Plan: Extubation in OR  Informed Consent: I have reviewed the patients History and Physical, chart, labs and discussed the procedure including the risks, benefits and alternatives for the proposed anesthesia with the patient or authorized representative who has indicated his/her understanding and acceptance.   Dental advisory given  Plan Discussed with: CRNA  Anesthesia Plan Comments: (Bilateral PECS block)       Anesthesia Quick Evaluation  Anesthesia Physical Anesthesia Plan  ASA: II and emergent  Anesthesia Plan: General   Post-op Pain Management:    Induction: Intravenous, Rapid sequence and Cricoid pressure planned  Airway Management Planned: Oral ETT  Additional Equipment:   Intra-op Plan:   Post-operative Plan: Extubation in OR  Informed Consent: I have reviewed the patients History and Physical, chart, labs and discussed the procedure including the risks, benefits and alternatives for the proposed anesthesia with the patient or authorized representative who has indicated his/her understanding and acceptance.     Plan Discussed with: CRNA  and Surgeon  Anesthesia Plan Comments:         Anesthesia Quick Evaluation

## 2016-04-13 NOTE — Anesthesia Postprocedure Evaluation (Addendum)
Anesthesia Post Note  Patient: Chelsea Salinas  Procedure(s) Performed: Procedure(s) (LRB): Evacuation RIGHT Breast  Hematoma (Right)  Patient location during evaluation: PACU Anesthesia Type: General Level of consciousness: sedated Pain management: pain level controlled Vital Signs Assessment: post-procedure vital signs reviewed and stable Respiratory status: spontaneous breathing and respiratory function stable Cardiovascular status: stable Anesthetic complications: no       Last Vitals:  Vitals:   04/13/16 1715 04/13/16 1725  BP:  (!) 146/87  Pulse: 99 (!) 104  Resp: 13 14  Temp:      Last Pain:  Vitals:   04/13/16 1716  TempSrc:   PainSc: 6                  Shamya Macfadden DANIEL

## 2016-04-14 ENCOUNTER — Encounter (HOSPITAL_COMMUNITY): Payer: Self-pay | Admitting: General Surgery

## 2016-04-14 DIAGNOSIS — Z8041 Family history of malignant neoplasm of ovary: Secondary | ICD-10-CM | POA: Diagnosis not present

## 2016-04-14 DIAGNOSIS — L7632 Postprocedural hematoma of skin and subcutaneous tissue following other procedure: Secondary | ICD-10-CM | POA: Diagnosis not present

## 2016-04-14 DIAGNOSIS — N6092 Unspecified benign mammary dysplasia of left breast: Secondary | ICD-10-CM | POA: Diagnosis not present

## 2016-04-14 DIAGNOSIS — Z79899 Other long term (current) drug therapy: Secondary | ICD-10-CM | POA: Diagnosis not present

## 2016-04-14 DIAGNOSIS — Z4001 Encounter for prophylactic removal of breast: Secondary | ICD-10-CM | POA: Diagnosis not present

## 2016-04-14 DIAGNOSIS — Z1501 Genetic susceptibility to malignant neoplasm of breast: Secondary | ICD-10-CM | POA: Diagnosis not present

## 2016-04-14 DIAGNOSIS — I10 Essential (primary) hypertension: Secondary | ICD-10-CM | POA: Diagnosis not present

## 2016-04-14 DIAGNOSIS — Z87891 Personal history of nicotine dependence: Secondary | ICD-10-CM | POA: Diagnosis not present

## 2016-04-14 DIAGNOSIS — Z803 Family history of malignant neoplasm of breast: Secondary | ICD-10-CM | POA: Diagnosis not present

## 2016-04-14 DIAGNOSIS — D242 Benign neoplasm of left breast: Secondary | ICD-10-CM | POA: Diagnosis not present

## 2016-04-14 DIAGNOSIS — C50911 Malignant neoplasm of unspecified site of right female breast: Secondary | ICD-10-CM | POA: Diagnosis not present

## 2016-04-14 MED ORDER — OXYCODONE HCL 5 MG PO TABS: 5.0000 mg | ORAL_TABLET | ORAL | 0 refills | Status: DC | PRN

## 2016-04-14 NOTE — Progress Notes (Signed)
1 Day Post-Op  Subjective: Feels well, wants to go home  Objective: Vital signs in last 24 hours: Temp:  [98.1 F (36.7 C)-98.6 F (37 C)] 98.6 F (37 C) (03/08 0550) Pulse Rate:  [83-104] 87 (03/08 0550) Resp:  [10-17] 17 (03/08 0550) BP: (112-153)/(67-87) 112/67 (03/08 0550) SpO2:  [87 %-100 %] 95 % (03/08 0550)    Intake/Output from previous day: 03/07 0701 - 03/08 0700 In: 2286.3 [P.O.:740; I.V.:1546.3] Out: 3888 [Urine:1250; Drains:335; Blood:200] Intake/Output this shift: No intake/output data recorded.  Incision/Wound: Bilateral mastectomy incisions clean without drainage, no hematoma, drains with expected output  Lab Results:   Recent Labs  04/13/16 0900  WBC 8.8  HGB 9.0*  HCT 27.1*  PLT 197   BMET No results for input(s): NA, K, CL, CO2, GLUCOSE, BUN, CREATININE, CALCIUM in the last 72 hours. PT/INR No results for input(s): LABPROT, INR in the last 72 hours. ABG No results for input(s): PHART, HCO3 in the last 72 hours.  Invalid input(s): PCO2, PO2  Studies/Results: No results found.  Anti-infectives: Anti-infectives    Start     Dose/Rate Route Frequency Ordered Stop   04/13/16 1517  ceFAZolin (ANCEF) 2-4 GM/100ML-% IVPB    Comments:  Trixie Deis   : cabinet override      04/13/16 1517 04/14/16 0329   04/12/16 1200  ceFAZolin (ANCEF) IVPB 2g/100 mL premix     2 g 200 mL/hr over 30 Minutes Intravenous To Surgery 04/12/16 1147 04/12/16 1342      Assessment/Plan: POD 2/1 bilateral masty/sn biopsy, evac hematoma  Home today Path pending  [facility] 04/14/2016

## 2016-04-18 ENCOUNTER — Telehealth: Payer: Self-pay | Admitting: *Deleted

## 2016-04-18 NOTE — Discharge Summary (Signed)
Physician Discharge Summary  Patient ID: Chelsea Salinas MRN: [id number] DOB/AGE: Jun 18, 1955 61 y.o.  Admit date: 04/12/2016 Discharge date: 04/18/2016  Admission Diagnoses: Breast cancer BRCA mutation HTN  Discharge Diagnoses:  Active Problems:   Breast cancer, right [facility])   Discharged Condition: Good  Hospital Course: 84 yof admitted and underwent bilateral sm with sn biopsy for breast cancer and brca 1 mutation.  She postop had right sided hematoma that I evacuated the following day.  She did well and was discharged home the following day  Consults: none Significant Diagnostic Studies: none  Treatments bilateral total mastectomies , sn biopsy Hematoma evacuation    Disposition: 01-Home or Self Care   Allergies as of 04/14/2016   No Known Allergies     Medication List    TAKE these medications   ALZAIR ALLERGY NASAL SPRAY NA Place 1 spray into the nose daily as needed (nasal congestion).   aspirin 81 MG tablet Take 81 mg by mouth daily.   cloNIDine 0.2 MG tablet Commonly known as:  CATAPRES TAKE 1 TABLET(0.2 MG) BY MOUTH AT BEDTIME   cloNIDine 0.2 MG tablet Commonly known as:  CATAPRES TAKE 1 TABLET BY MOUTH AT BEDTIME   [employer] FISH OIL 1200 MG Caps Take 1,200 mg by mouth daily.   [employer] VITAMIN D 2000 units Caps Generic drug:  Cholecalciferol Take 2,000 Units by mouth daily.   cyanocobalamin 1000 MCG tablet Take 1,000 mcg by mouth every other day.   escitalopram 20 MG tablet Commonly known as:  LEXAPRO Take 1 tablet (20 mg total) by mouth daily. What changed:  when to take this   FLAX SEEDS PO Take by mouth daily.   losartan 100 MG tablet Commonly known as:  COZAAR Take 1 tablet (100 mg total) by mouth daily.   magnesium oxide 400 MG tablet Commonly known as:  MAG-OX Take 400 mg by mouth every other day.   montelukast 10 MG tablet Commonly known as:  SINGULAIR TAKE 1 TABLET(10 MG) BY MOUTH DAILY What changed:  See the new instructions.    multivitamin with minerals tablet Take 1 tablet by mouth daily.   oxyCODONE 5 MG immediate release tablet Commonly known as:  Oxy IR/ROXICODONE Take 1-2 tablets (5-10 mg total) by mouth every 4 (four) hours as needed for moderate pain.   triamcinolone cream 0.1 % Commonly known as:  KENALOG Apply 1 application topically 2 (two) times daily. What changed:  when to take this  reasons to take this   valACYclovir 500 MG tablet Commonly known as:  VALTREX Take 1 tablet (500 mg total) by mouth 2 (two) times daily. Take for 3 days as needed. What changed:  when to take this  reasons to take this  additional instructions   venlafaxine XR 75 MG 24 hr capsule Commonly known as:  EFFEXOR-XR TAKE 2 CAPSULES BY MOUTH EVERY MORNING AND 1 CAPSULE EVERY EVENING        Signed: Nysha Koplin 04/18/2016, 6:22 PM

## 2016-04-18 NOTE — Telephone Encounter (Signed)
Received order per Dr. Burr Medico for oncotype testing. Requisition sent to pathology. Received by Peter Congo. PAC sent to [facility] and genomic health

## 2016-04-19 ENCOUNTER — Other Ambulatory Visit: Payer: Self-pay | Admitting: Internal Medicine

## 2016-04-27 ENCOUNTER — Other Ambulatory Visit: Payer: Self-pay | Admitting: Physician Assistant

## 2016-04-29 ENCOUNTER — Encounter: Payer: Self-pay | Admitting: Obstetrics and Gynecology

## 2016-04-29 ENCOUNTER — Ambulatory Visit (INDEPENDENT_AMBULATORY_CARE_PROVIDER_SITE_OTHER): Payer: BLUE CROSS/BLUE SHIELD | Admitting: Obstetrics and Gynecology

## 2016-04-29 VITALS — BP 120/76 | HR 88 | Resp 20 | Ht 64.5 in | Wt 153.0 lb

## 2016-04-29 DIAGNOSIS — Z1501 Genetic susceptibility to malignant neoplasm of breast: Secondary | ICD-10-CM | POA: Diagnosis not present

## 2016-04-29 DIAGNOSIS — Z01411 Encounter for gynecological examination (general) (routine) with abnormal findings: Secondary | ICD-10-CM

## 2016-04-29 DIAGNOSIS — Z1509 Genetic susceptibility to other malignant neoplasm: Secondary | ICD-10-CM

## 2016-04-29 DIAGNOSIS — Z1502 Genetic susceptibility to malignant neoplasm of ovary: Secondary | ICD-10-CM | POA: Diagnosis not present

## 2016-04-29 DIAGNOSIS — Z1151 Encounter for screening for human papillomavirus (HPV): Secondary | ICD-10-CM | POA: Diagnosis not present

## 2016-04-29 DIAGNOSIS — Z01419 Encounter for gynecological examination (general) (routine) without abnormal findings: Secondary | ICD-10-CM

## 2016-04-29 NOTE — Progress Notes (Signed)
61 y.o. G12P0010 Married Caucasian female here for annual exam.    Had double mastectomy for right breast cancer on 04/13/16.  Had a hematoma and just had her final drain removed.  Awaiting onco typing results to see if she needs any chemotherapy.   New dx BRCA1 positive status.  Had a normal pelvic ultrasound 04/05/16. Asking what this means for her.   Does not need refill of Valtrex.   PCP:   [name], MD - labs every 6 months with PCP.  No LMP recorded. Patient is not currently having periods (Reason: Perimenopausal).      LMP 02/07/1998     Sexually active: No.  The current method of family planning is post menopausal status.    Exercising: Yes.    Walking, yoga and housework Smoker:  Former, quit [place] Maintenance: Pap: 04-17-15 Neg:Neg HR HPV; 11-08-11 Neg History of abnormal Pap:  Yes,  Hx of colposcopy and some type of treatment to cervix in her 33s which was done at the hospital. Later in life hx of abnormal paps with Dr. Carren Rang, but only had repeat paps--no treatment. MMG: 02-29-16 Density C/distortion Rt.breast & poss.asymmetry;Lt.Br.poss.asymmetry--Diag.MMG & Rt.Br.US revealed mass in Rt.Br.suspicious for malignancy. 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 Carin Primrose 12/2025 BMD:   2015  Result  Normal with Dr. Carren Rang TDaP: PCP Gardasil:   N/A HIV:  Negative testing a long ago was negative.  Hep C:  Will dw with PCP.  Screening Labs:  Hb today: PCP/Oncology, Urine today: not done   reports that she quit smoking about 20 years ago. She has a 25.00 pack-year smoking history. She has never used smokeless tobacco. She reports that she drinks about 21.6 oz of alcohol per week . She reports that she does not use drugs.  Past Medical History:  Diagnosis Date  . Abnormal Pap smear of cervix    --in her 20s had colpo and some type of treatment to cervix.  . Allergy   . Asthma    "all the time, throughout the  yr"  . 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 Breast cancer, right breast ([place]) 02/2016  . Eczema   . Family history of breast cancer   . Family history of genetic disease carrier    paternal relatives with a BRCA mutation  . Family history of ovarian cancer   . Hypertension   . STD (sexually transmitted disease)    HSV II    Past Surgical History:  Procedure Laterality Date  . BREAST BIOPSY Right 04/13/2016   Procedure: Evacuation RIGHT Breast  Hematoma;  Surgeon: [name], MD;  Location: [place];  Service: General;  Laterality: Right;  . DILATION AND CURETTAGE OF UTERUS  2008  . MASTECTOMY Left 04/12/2016   prophylactic total mastectomy  . MASTECTOMY COMPLETE / SIMPLE W/ SENTINEL NODE BIOPSY Right 04/12/2016   axillary sentinel LNB  . MASTECTOMY W/ SENTINEL NODE BIOPSY Bilateral 04/12/2016   Procedure: BILATERAL TOTAL MASTECTOMIES  WITH RIGHT SENTINEL LYMPH NODE BIOPSY;  Surgeon: [name], MD;  Location: [place];  Service: General;  Laterality: Bilateral;    Current Outpatient Prescriptions  Medication Sig Dispense Refill  . aspirin 81 MG tablet Take 81 mg by mouth daily.    . cetirizine (ZYRTEC) 10 MG tablet Take 10 mg by mouth daily.    . cloNIDine (CATAPRES) 0.2 MG tablet Take 1 tablet by mouth at bedtime  as needed.  0  . escitalopram (LEXAPRO) 10 MG tablet Take 1 tablet by mouth daily.  0  . losartan (COZAAR) 100 MG tablet Take 1 tablet (100 mg total) by mouth daily. 30 tablet 11  . magnesium oxide (MAG-OX) 400 MG tablet Take 400 mg by mouth every other day.    . montelukast (SINGULAIR) 10 MG tablet TAKE 1 TABLET(10 MG) BY MOUTH DAILY (Patient taking differently: TAKE 1 TABLET BY MOUTH AS NEEDED FOR SHORTNESS OF BREATH) 90 tablet 0  . Multiple Vitamins-Minerals (MULTIVITAMIN WITH MINERALS) tablet Take 1 tablet by mouth daily.    . Omega-3 Fatty Acids ([employer] FISH OIL) 1200 MG CAPS Take 1,200 mg by mouth daily.    Marland Kitchen oxyCODONE (OXY  IR/ROXICODONE) 5 MG immediate release tablet Take 1-2 tablets (5-10 mg total) by mouth every 4 (four) hours as needed for moderate pain. 25 tablet 0  . venlafaxine XR (EFFEXOR-XR) 75 MG 24 hr capsule TAKE 2 CAPSULES BY MOUTH EVERY MORNING AND 1 CAPSULE EVERY EVENING 270 capsule 0  . vitamin B-12 (CYANOCOBALAMIN) 100 MCG tablet Take 100 mcg by mouth daily.    . valACYclovir (VALTREX) 500 MG tablet Take 1 tablet (500 mg total) by mouth 2 (two) times daily. Take for 3 days as needed. (Patient not taking: Reported on 04/29/2016) 30 tablet 1   No current facility-administered medications for this visit.     Family History  Problem Relation Age of Onset  . Polymyalgia rheumatica Mother   . Hypertension Father   . Heart disease Father     Dec 69  . Heart attack Father   . Prostate cancer Father     Dx 35s; deceased 32  . Breast cancer Cousin     Dx 57s; daughter of a paternal uncle  . Ovarian cancer Paternal Aunt     Dx 69; deceased  . Ovarian cancer Cousin     Dx 53s; daughter of a paternal uncle  . Ovarian cancer Cousin     Dx 29; daughter of a paternal uncle  . Ovarian cancer Paternal Aunt     Dx 65s.  BRCA positive  . Ovarian cancer Other     pat grandfather's mother    ROS:  Pertinent items are noted in HPI.  Otherwise, a comprehensive ROS was negative.  Exam:   BP 120/76 (BP Location: Left Arm, Patient Position: Sitting, Cuff Size: Normal)   Pulse 88   Resp 20   Ht 5' 4.5" (1.638 m)   Wt 153 lb (69.4 kg)   BMI 25.86 kg/m     General appearance: alert, cooperative and appears stated age Head: Normocephalic, without obvious abnormality, atraumatic Neck: no adenopathy, supple, symmetrical, trachea midline and thyroid normal to inspection and palpation Lungs: clear to auscultation bilaterally Breasts:  Not examined.  Has chest jacket on.   Heart: regular rate and rhythm Abdomen: soft, non-tender; no masses, no organomegaly Extremities: extremities normal, atraumatic, no  cyanosis or edema Skin: Skin color, texture, turgor normal. No rashes or lesions Lymph nodes: Cervical, supraclavicular, and axillary nodes normal. No abnormal inguinal nodes palpated Neurologic: Grossly normal  Pelvic: External genitalia:  no lesions              Urethra:  normal appearing urethra with no masses, tenderness or lesions              Bartholins and Skenes: normal                 Vagina: normal  appearing vagina with normal color and discharge, no lesions              Cervix: no lesions.  Vein on cervix.               Pap taken: Yes.   Bimanual Exam:  Uterus:  normal size, contour, position, consistency, mobility, non-tender              Adnexa: no mass, fullness, tenderness              Rectal exam: Yes.  .  Confirms.              Anus:  normal sphincter tone, no lesions  Chaperone was present for exam.  Assessment:   Well woman visit with normal exam. BRCA1 positive.  Status post bilateral mastectomy for breast cancer.  Remote history of abnormal pap smear. Hx HSV II.  Plan:   Pap and HR HPV as above. Guidelines for Calcium, Vitamin D, regular exercise program including cardiovascular and weight bearing exercise. CA125.  Will plan for laparoscopic BSO with collection of pelvic washings versus laparoscopic hysterectomy with bilateral salpingo-oophorectomy/collection of pelvic washings.  Patient will consider both options.  ACOG HO on hysterectomy.  Follow up annually and prn.   An additional 15 minutes spent discussing BRCA1 status and guidelines for care.  Over 50% was spent in counseling.   After visit summary provided.

## 2016-04-29 NOTE — Patient Instructions (Addendum)
EXERCISE AND DIET:  We recommended that you start or continue a regular exercise program for good health. Regular exercise means any activity that makes your heart beat faster and makes you sweat.  We recommend exercising at least 30 minutes per day at least 3 days a week, preferably 4 or 5.  We also recommend a diet low in fat and sugar.  Inactivity, poor dietary choices and obesity can cause diabetes, heart attack, stroke, and kidney damage, among others.    ALCOHOL AND SMOKING:  Women should limit their alcohol intake to no more than 7 drinks/beers/glasses of wine (combined, not each!) per week. Moderation of alcohol intake to this level decreases your risk of breast cancer and liver damage. And of course, no recreational drugs are part of a healthy lifestyle.  And absolutely no smoking or even second hand smoke. Most people know smoking can cause heart and lung diseases, but did you know it also contributes to weakening of your bones? Aging of your skin?  Yellowing of your teeth and nails?  CALCIUM AND VITAMIN D:  Adequate intake of calcium and Vitamin D are recommended.  The recommendations for exact amounts of these supplements seem to change often, but generally speaking 600 mg of calcium (either carbonate or citrate) and 800 units of Vitamin D per day seems prudent. Certain women may benefit from higher intake of Vitamin D.  If you are among these women, your doctor will have told you during your visit.    PAP SMEARS:  Pap smears, to check for cervical cancer or precancers,  have traditionally been done yearly, although recent scientific advances have shown that most women can have pap smears less often.  However, every woman still should have a physical exam from her gynecologist every year. It will include a breast check, inspection of the vulva and vagina to check for abnormal growths or skin changes, a visual exam of the cervix, and then an exam to evaluate the size and shape of the uterus and  ovaries.  And after 63 years of age, a rectal exam is indicated to check for rectal cancers. We will also provide age appropriate advice regarding health maintenance, like when you should have certain vaccines, screening for sexually transmitted diseases, bone density testing, colonoscopy, mammograms, etc.   MAMMOGRAMS:  All women over 52 years old should have a yearly mammogram. Many facilities now offer a "3D" mammogram, which may cost around $50 extra out of pocket. If possible,  we recommend you accept the option to have the 3D mammogram performed.  It both reduces the number of women who will be called back for extra views which then turn out to be normal, and it is better than the routine mammogram at detecting truly abnormal areas.    COLONOSCOPY:  Colonoscopy to screen for colon cancer is recommended for all women at age 18.  We know, you hate the idea of the prep.  We agree, BUT, having colon cancer and not knowing it is worse!!  Colon cancer so often starts as a polyp that can be seen and removed at colonscopy, which can quite literally save your life!  And if your first colonoscopy is normal and you have no family history of colon cancer, most women don't have to have it again for 10 years.  Once every ten years, you can do something that may end up saving your life, right?  We will be happy to help you get it scheduled when you are ready.  Be sure to check your insurance coverage so you understand how much it will cost.  It may be covered as a preventative service at no cost, but you should check your particular policy.    BRCA Gene Testing Why am I having this test? BRCA gene testing is done to check for the presence of harmful changes (mutations) in the BRCA1 gene or the BRCA2 gene (breast cancer susceptibility genes). If there is a mutation, the genes may not be able to help repair damaged cells in the body. As a result, the damaged cells may develop defects that can lead to certain types of  cancer. You may have this test if you have a family history of certain types of cancer, including cancer of the:  Breast.  Ovaries.  Fallopian tubes.  Peritoneum.  Pancreas.  Prostate. What kind of sample is taken? The test requires either a sample of blood or a sample of cells from your saliva. If a sample of blood is needed, it will probably be collected by inserting a needle into a vein. If a sample of saliva is needed, you will get instructions about how to collect the sample. What do the results mean? The test results can show whether you have a mutation in the BRCA1 or BRCA2 gene that increases your risk for certain cancers. Meaning of negative test results  A negative test result means that you do not have a mutation in the BRCA1 or BRCA2 gene that is known to increase your risk for certain cancers. This does not mean that you will never get cancer. Talk with your health care provider or a genetic counselor about what this result means for you. Meaning of positive test results  A positive test result means that you have a mutation in the BRCA1 or BRCA2 gene that increases your risk for certain cancers. Women with a positive test result have an increased risk for breast and ovarian cancer. Both women and men with a mutation have an increased risk for breast cancer and may be at greater risk for other types of cancer. Getting a positive test result does not mean that you will develop cancer. Talk with your health care provider or a genetic counselor about what this result means for you. You may be told that you are a carrier. This means that you can pass the mutation to your children. Meaning of ambiguous test results  Ambiguous, inconclusive, or uncertain test results mean that there is a change in the BRCA1 or BRCA2 gene, but it is a change that has not been linked to cancer. Talk with your health care provider or a genetic counselor about what this result means for you. Talk with  your health care provider to discuss your results, treatment options, and if necessary, the need for more tests. Talk with your health care provider if you have any questions about your results. How do I get my results? It is up to you to get your test results. Ask your health care provider, or the department that is doing the test, when your results will be ready. This information is not intended to replace advice given to you by your health care provider. Make sure you discuss any questions you have with your health care provider. Document Released: 02/18/2004 Document Revised: 09/28/2015 Document Reviewed: 09/16/2015 Elsevier Interactive Patient Education  2017 [employer].

## 2016-05-02 LAB — CA 125: CA 125: 27 U/mL (ref <35)

## 2016-05-03 ENCOUNTER — Telehealth: Payer: Self-pay | Admitting: *Deleted

## 2016-05-03 NOTE — Telephone Encounter (Signed)
Received notes from after hours nursing call center re:  Pt called reporting redness, itching, and scaley around drain sites from double mastectomy surgery 3 weeks ago.  Spoke with pt, and was informed that the redness seemed less than yesterday.  Pt felt it was the irritation of skin from wearing compression bra  24/7.  Denied drainage, denied streaking.   Pt has -   a friend who is a physician - also looked at the sites.  Pt was instructed to keep monitoring for any changes.   Instructed pt if the redness worsened , and/or if pt noticed any streaking at sites, pt needs to contact the surgeon's office as soon as possible.  Pt voiced understanding. Pt also wanted to know results of Oncotype testing.  Informed pt that Dr. Burr Medico will review results when available, and pt will be contacted with further instructions. Called message will be scanned in Epic.

## 2016-05-04 ENCOUNTER — Encounter: Payer: Self-pay | Admitting: Physical Therapy

## 2016-05-04 ENCOUNTER — Ambulatory Visit: Payer: BLUE CROSS/BLUE SHIELD | Attending: General Surgery | Admitting: Physical Therapy

## 2016-05-04 DIAGNOSIS — M6281 Muscle weakness (generalized): Secondary | ICD-10-CM

## 2016-05-04 DIAGNOSIS — R600 Localized edema: Secondary | ICD-10-CM

## 2016-05-04 DIAGNOSIS — C50111 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 female breast: Secondary | ICD-10-CM | POA: Diagnosis not present

## 2016-05-04 DIAGNOSIS — M25612 Stiffness of left shoulder, not elsewhere classified: Secondary | ICD-10-CM

## 2016-05-04 DIAGNOSIS — M25611 Stiffness of right shoulder, not elsewhere classified: Secondary | ICD-10-CM | POA: Diagnosis not present

## 2016-05-04 LAB — IPS PAP TEST WITH HPV

## 2016-05-04 NOTE — Patient Instructions (Signed)
Shoulder: Flexion (Supine)    With hands shoulder width apart, slowly lower dowel to floor behind head. Do not let elbows bend. Keep back flat. Hold 10-15____ seconds. Repeat _10___ times. Do __2__ sessions per day. CAUTION: Stretch slowly and gently.  Copyright  VHI. All rights reserved.  Shoulder: Abduction (Supine)    With right arm flat on floor, hold dowel in palm. Slowly move arm up to side of head by pushing with opposite arm. Do not let elbow bend. Hold _10-15___ seconds. Repeat __10__ times. Do _2___ sessions per day. CAUTION: Stretch slowly and gently. Complete on opposite side.   Copyright  VHI. All rights reserved.

## 2016-05-04 NOTE — Therapy (Signed)
[place], [place], [address] Phone: [phone]   Fax:  [phone]  Physical Therapy Evaluation  Patient Details  Name: JACKY HARTUNG MRN: [id number] Date of Birth: 14-Jun-1955 Referring Provider: [name]  Encounter Date: 05/04/2016      PT End of Session - 05/04/16 1144    Visit Number 1   Number of Visits 9   Date for PT Re-Evaluation 06/01/16   PT Start Time 1105   PT Stop Time 1144   PT Time Calculation (min) 39 min   Activity Tolerance Patient tolerated treatment well   Behavior During Therapy [facility] for tasks assessed/performed      Past Medical History:  Diagnosis Date  . Abnormal Pap smear of cervix    --in her 20s had colpo and some type of treatment to cervix.  . Allergy   . Asthma    "all the time, throughout the yr"  . 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 Breast cancer, right breast ([place]) 02/2016  . Eczema   . Family history of breast cancer   . Family history of genetic disease carrier    paternal relatives with a BRCA mutation  . Family history of ovarian cancer   . Hypertension   . STD (sexually transmitted disease)    HSV II    Past Surgical History:  Procedure Laterality Date  . BREAST BIOPSY Right 04/13/2016   Procedure: Evacuation RIGHT Breast  Hematoma;  Surgeon: [name], MD;  Location: [facility];  Service: General;  Laterality: Right;  . DILATION AND CURETTAGE OF UTERUS  2008  . MASTECTOMY Left 04/12/2016   prophylactic total mastectomy  . MASTECTOMY COMPLETE / SIMPLE W/ SENTINEL NODE BIOPSY Right 04/12/2016   axillary sentinel LNB  . MASTECTOMY W/ SENTINEL NODE BIOPSY Bilateral 04/12/2016   Procedure: BILATERAL TOTAL MASTECTOMIES  WITH RIGHT SENTINEL LYMPH NODE BIOPSY;  Surgeon: [name], MD;  Location: [place];  Service: General;  Laterality: Bilateral;    There were no vitals filed for this visit.        Subjective Assessment - 05/04/16 1107    Subjective Patient had bilateral mastectomy (04/12/16)  and had one lymph node biopsied on the right side. Pt developed a hematoma on the right side and had to have it drained on 04/13/16. Pt reports she is having some redness and swelling and plans on calling her surgeon this afternoon. She reports the swelling is improving.    Pertinent History Patient was diagnosed on 02/29/16 with right grade 2 invasive ductal carcinoma breast cancer. It measures 1.7 cm and is located in the upper outer quadrant.  It is ER positive, PR negative and HER2 negative with a Ki67 of 20%, pt is waiting to find out if she will need chemotherapy, she does not think she will require any radiation   Patient Stated Goals to resume normal activities as quickly as possible   Currently in Pain? Yes   Pain Score 2    Pain Location Axilla   Pain Orientation Right;Left   Pain Descriptors / Indicators Discomfort   Pain Type Surgical pain   Pain Onset 1 to 4 weeks ago   Pain Frequency Constant   Aggravating Factors  over doing it   Pain Relieving Factors resting   Effect of Pain on Daily Activities pt is limited currently because of precautions since she just had drains removed  [facility] PT Assessment - 05/04/16 0001      Assessment   Medical Diagnosis Right breast cancer   Referring Provider [name]   Onset Date/Surgical Date 04/12/16  04/13/16- had to drain hematoma on right   Hand Dominance Right   Prior Therapy none     Precautions   Precautions Other (comment)   Precaution Comments had drains removed removed 04/29/16     Restrictions   Weight Bearing Restrictions No     Balance Screen   Has the patient fallen in the past 6 months No   Has the patient had a decrease in activity level because of a fear of falling?  No   Is the patient reluctant to leave their home because of a fear of falling?  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   [place] Requirements Painting   Leisure prior to surgery: She walks daily > 1 hour and does yoga 5x/week     Cognition   Overall Cognitive Status Within Functional Limits for tasks assessed     Observation/Other Assessments   Skin Integrity bilateral mastectomy scars are healing, steri strips intact, there is redness surrounding right mastectomy scar and pt is going to the doctor about it today     Posture/Postural Control   Posture/Postural Control No significant limitations     ROM / Strength   AROM / PROM / Strength AROM;Strength     AROM   Right Shoulder Extension --   Right Shoulder Flexion 150 Degrees   Right Shoulder ABduction 165 Degrees   Right Shoulder Internal Rotation 75 Degrees   Right Shoulder External Rotation 90 Degrees   Left Shoulder Extension --   Left Shoulder Flexion 169 Degrees   Left Shoulder ABduction 180 Degrees   Left Shoulder Internal Rotation 60 Degrees   Left Shoulder External Rotation 90 Degrees   Cervical Flexion --   Cervical Extension --   Cervical - Right Side Bend --   Cervical - Left Side Bend --   Cervical - Right Rotation --   Cervical - Left Rotation --     Strength   Overall Strength Deficits  R tricep 3+/5, L 4/5, shoulder flexion and abduction 4+/5            LYMPHEDEMA/ONCOLOGY QUESTIONNAIRE - 05/04/16 1121      Type   Cancer Type RIght breast cancer     Surgeries   Mastectomy Date 04/12/16  04/13/16 had hematoma drained   Sentinel Lymph Node Biopsy Date 04/12/16   Other Surgery Date 04/13/16  had hematoma on right side drained   Number  Lymph Nodes Removed 1     Date Lymphedema/Swelling Started   Date 04/12/16  most likely post surgical, in right axilla and upper arm     Treatment   Active Chemotherapy Treatment --  pt unsure if she will need   Past Chemotherapy Treatment No   Active Radiation Treatment No   Past Radiation Treatment No   Current Hormone Treatment No   Past Hormone Therapy  No     What other symptoms do you have   Are you Having Heaviness or Tightness No   Are you having Pain Yes   Are you having pitting edema No   Is it Hard or Difficult finding clothes that fit No   Do you have infections No  pt had redness on right side inferior to mastectomy scar    Is there Decreased scar mobility Yes     Lymphedema Assessments   Lymphedema Assessments Upper extremities     Right Upper Extremity Lymphedema   15 cm Proximal to Olecranon Process 27 cm   10 cm Proximal to Olecranon Process 25 cm   Olecranon Process 21.8 cm   10 cm Proximal to Ulnar Styloid Process 20.5 cm   Just Proximal to Ulnar Styloid Process 14.2 cm   Across Hand at [employer] 18.2 cm   At [place] of 2nd Digit 6 cm     Left Upper Extremity Lymphedema   15 cm Proximal to Olecranon Process 27 cm   10 cm Proximal to Olecranon Process 25.5 cm   Olecranon Process 23 cm   10 cm Proximal to Ulnar Styloid Process 19.2 cm   Just Proximal to Ulnar Styloid Process 14 cm   Across Hand at [employer] 18 cm   At [place] of 2nd Digit 6.2 cm                [facility] Adult PT Treatment/Exercise - 05/04/16 0001      Shoulder Exercises: Supine   Flexion 5 reps;AAROM  dowel with 10 sec hold   ABduction AAROM;Both;5 reps  dowel, 10 sec hold                PT Education - 05/04/16 1154    Education provided Yes   Education Details lymphedema risk reduction practices   Person(s) Educated Patient   Methods Explanation;Handout   Comprehension Verbalized understanding              Breast Clinic Goals - 03/16/16 1705       Patient will be able to verbalize understanding of pertinent lymphedema risk reduction practices relevant to her diagnosis specifically related to skin care.   Time 1   Period Days   Status Achieved     Patient will be able to return demonstrate and/or verbalize understanding of the post-op home exercise program related to regaining shoulder range of motion.   Time 1   Period Days   Status Achieved     Patient will be able to verbalize understanding of the importance of attending the postoperative After Breast Cancer Class for further lymphedema risk reduction education and therapeutic exercise.   Time 1   Period Days   Status Achieved          Long Term Clinic Goals - 05/04/16 1151      CC Long Term Goal  #1   Title Pt to independently verbalize lymphedema risk reduction practices   Time 4   Period Weeks   Status New     CC Long Term Goal  #2   Title Pt to demonstrate 4/5 right tricep strength to allow her to return to prior level of function   Baseline 3+/5   Time 4   Period Weeks   Status New     CC Long Term Goal  #3   Title Pt to be independent in a home exercise program for continued strengthening and stretching   Time  4   Period Weeks   Status New     CC Long Term Goal  #4   Title Pt to report at least a 30 percent improvement in right axillary swelling to improve comfort   Time 4   Period Weeks   Status New     CC Long Term Goal  #5   Title Pt will demonstrate 165 degrees of right shoulder flexion to allow her to reach items overhead   Baseline 151   Time 4   Period Weeks   Status New            Plan - 05/04/16 1145    Clinical Impression Statement Pt s/p bilateral mastecomy on 04/12/16 for treatment of 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 She had a sentinel lymph node biopsy at this time. On 04/13/16 she had to have fluid removed because she developed a hematoma on the right side. She is waiting to find out if she will need to  undergo chemotherapy and does not think she will require radiation. Patient has decreased ROM bilaterally when compared to prior level of function before surgery. She demonstrates bilateral tricep weakness. She would benefit from skilled PT services for scar mobilization once her incisions heal, to increase bilateral shoulder ROM and strength and progress pt towards independence with a home exercise program. She also has considerable edema in right axilla that may benefit from manual lymphatic drainage. This evaluation was of low complexity due to lack of comorbidities.    Rehab Potential Excellent   Clinical Impairments Affecting Rehab Potential none   PT Frequency 2x / week   PT Duration 4 weeks   PT Treatment/Interventions Patient/family education;Therapeutic exercise;Manual techniques;Manual lymph drainage;Scar mobilization;Passive range of motion;Taping;ADLs/Self Care Home Management   PT Next Visit Plan begin gentle bilateral shoulder strengthening exercises, therapy ball, pulleys, 3 way shoulder, [place]   PT Home Exercise Plan Post op shoulder ROM HEP   Consulted and Agree with Plan of Care Patient      Patient will benefit from skilled therapeutic intervention in order to improve the following deficits and impairments:  Decreased knowledge of precautions, Pain, Impaired UE functional use, Decreased range of motion, Increased edema, Decreased scar mobility, Decreased strength  Visit Diagnosis: Stiffness of right shoulder, not elsewhere classified - Plan: PT plan of care cert/re-cert  Stiffness of left shoulder, not elsewhere classified - Plan: PT plan of care cert/re-cert  Localized edema - Plan: PT plan of care cert/re-cert  Muscle weakness (generalized) - Plan: PT plan of care cert/re-cert     Problem List Patient Active Problem List   Diagnosis Date Noted  . Breast cancer, right ([place]) 04/12/2016  . Genetic testing 04/08/2016  . BRCA1 positive   . Family history of breast  cancer   . Family history of ovarian cancer   . Family history of genetic disease carrier   . Breast cancer ([place])   . 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 breast in female, estrogen receptor positive ([place]) 03/15/2016  . Vitamin D deficiency 05/18/2015  . Hot flashes 05/18/2015  . Hypertension   . Asthma   . Allergy   . Eczema     Allyson Sabal [facility] 05/04/2016, 11:57 AM  [place], [place], [address] Phone: [phone]   Fax:  [phone]  Name: AMERIS AKAMINE MRN: [id number] Date of Birth: 17-Dec-1955  Manus Gunning, PT 05/04/16 11:58 AM

## 2016-05-05 ENCOUNTER — Ambulatory Visit (HOSPITAL_BASED_OUTPATIENT_CLINIC_OR_DEPARTMENT_OTHER): Payer: BLUE CROSS/BLUE SHIELD | Admitting: Hematology

## 2016-05-05 ENCOUNTER — Telehealth: Payer: Self-pay | Admitting: *Deleted

## 2016-05-05 ENCOUNTER — Encounter: Payer: Self-pay | Admitting: Hematology

## 2016-05-05 VITALS — BP 145/73 | HR 81 | Temp 98.0°F | Resp 18 | Ht 64.5 in | Wt 151.9 lb

## 2016-05-05 DIAGNOSIS — Z1379 Encounter for other screening for genetic and chromosomal anomalies: Secondary | ICD-10-CM

## 2016-05-05 DIAGNOSIS — C50811 Malignant neoplasm of overlapping sites of right female breast: Secondary | ICD-10-CM

## 2016-05-05 DIAGNOSIS — L03313 Cellulitis of chest wall: Secondary | ICD-10-CM | POA: Diagnosis not present

## 2016-05-05 DIAGNOSIS — C50111 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 female breast: Secondary | ICD-10-CM

## 2016-05-05 DIAGNOSIS — Z170 Estrogen receptor positive status [ER+]: Secondary | ICD-10-CM | POA: Diagnosis not present

## 2016-05-05 MED ORDER — ONDANSETRON HCL 8 MG PO TABS: 8.0000 mg | ORAL_TABLET | Freq: Two times a day (BID) | ORAL | 1 refills | Status: DC | PRN

## 2016-05-05 MED ORDER — PROCHLORPERAZINE MALEATE 10 MG PO TABS: 10.0000 mg | ORAL_TABLET | Freq: Four times a day (QID) | ORAL | 1 refills | Status: DC | PRN

## 2016-05-05 NOTE — Progress Notes (Signed)
START ON PATHWAY REGIMEN - Breast   Doxorubicin + Cyclophosphamide (AC):   A cycle is every 21 days:     Doxorubicin      Cyclophosphamide   **Always confirm dose/schedule in your pharmacy ordering system**    Paclitaxel 80 mg/m2 Weekly:   Administer weekly:     Paclitaxel   **Always confirm dose/schedule in your pharmacy ordering system**    Patient Characteristics: Adjuvant Therapy, Node Negative, HER2/neu Negative/Unknown/Equivocal, ER Positive, Oncotype High Risk (>= 31) AJCC Stage Grouping: IA Current Disease Status: No Distant Mets or Local Recurrence AJCC M Stage: 0 ER Status: Positive (+) AJCC N Stage: 0 AJCC T Stage: 1c HER2/neu: Negative (-) PR Status: Negative (-) Node Status: Negative (-) Has this patient completed genomic testing? Yes - Oncotype DX(R) Oncotype Recurrence Score: 49  Intent of Therapy: Curative Intent, Discussed with Patient 

## 2016-05-05 NOTE — Telephone Encounter (Signed)
Received oncotype score of 49/32%. Physician team notified.

## 2016-05-05 NOTE — Progress Notes (Signed)
Chelsea Salinas  [phone] [phone]  Clinic Follow Up Note   Patient Care Team: [name], MD as PCP - General (Internal Medicine) Renella Cunas, MD (Inactive) as Consulting Physician (Cardiology) [name], MD as Consulting Physician (Dermatology) 05/05/2016    CHIEF COMPLAINTS:  Follow up right breast cancer  Oncology History   Cancer Staging 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 breast in female, estrogen receptor positive ([place]) Staging form: Breast, AJCC 8th Edition - Clinical stage from 03/08/2016: Stage IA (cT1c, cN0, cM0, G2, ER: Positive, PR: Negative, HER2: Negative) - Signed by Truitt Merle, MD on 03/15/2016 - Pathologic stage from 04/12/2016: Stage IA (pT1c, pN0, cM0, G2, ER: Positive, PR: Negative, HER2: Negative, Oncotype DX score: 49) - Signed by Truitt Merle, MD on 05/05/2016       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 breast in female, estrogen receptor positive ([place])   03/04/2016 Mammogram    Diagnostic bilateral mammogram and right breast ultrasound showed a 1.7 cm mass in the 12:00 position of the right breast, highly suspicious for malignancy. There is a borderline abnormal right axillary lymph node, also suspicious for metastasis.       03/08/2016 Initial Diagnosis    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 breast in female, estrogen receptor positive ([place])      03/08/2016 Initial Biopsy    Right breast 12:00 core needle biopsy showed invasive ductal carcinoma, grade 2, right axillary lymph node biopsy was negative.      03/08/2016 Receptors her2    ER 20% positive, PR negative, HER-2 negative, Ki-67 20%      04/12/2016 Surgery    Bilateral mastectomy and right sentinel lymph node biopsy, by [name]      04/12/2016 Pathology Results    Right breast mastectomy showed invasive grade 2 invasive ductal carcinoma, 1.7 cm, margins were negative, 3 sentinel lymph nodes were negative, left simple  mastectomy fibroadenoma, one benign node, no atypia or tumor.       05/04/2016 Oncotype testing    Recurrence score 49, high-risk, predicts 10 year risk of distant recurrence 32% with tamoxifen alone.       HISTORY OF PRESENTING ILLNESS:  Chelsea Salinas 61 y.o. female is here because of recently diagnosed right breast invasive mammary carcinoma. She is accompanied by her husband to our multidisciplinary breast clinic today.  The patient underwent routine screening mammogram on 02/29/2016 and was found to have possible architectural distortion and asymmetry in the right breast. Also found was asymmetry in the left breast.  Accordingly a bilateral diagnostic mammogram and unilateral right breast ultrasound were performed on 03/04/2016. These scans showed a 1.7 cm mass in the 12 o'clock position of the right breast with imaging features highly suspicious for malignancy. Also seen was borderline abnormal right axillary lymph node, suspicious for a metastatic node.  Biopsy of the right breast 12:00 o'clock position on 03/08/2016 revealed invasive mammary carcinoma. The carcinoma appears to be at least grade 2, ER positive, PR negative, HER-2 negative, Ki67 20%. The malignant cells are positive for E-Cadherin, supporting a ductal phenotype. Biopsy of the right axillary lymph node showed no evidence of carcinoma.   The patient reports to the clinic today for follow up and to discuss chemotherapy. She has had surgery about 3 weeks ago and she has been doing well. She started antibiotics yesterday to help with infection that she has recently gotten. She has also started doing PT this week. She decided  not to do reconstruction. She has been eating well.    GYN HISTORY  Menarchal:12 LMP: late 80's (hot flushes since mid 52's)  Contraceptive: 23 HRT: 3-4 years  G0P0:   MEDICAL HISTORY:  Past Medical History:  Diagnosis Date  . Abnormal Pap smear of cervix    --in her 20s had colpo and some type of  treatment to cervix.  . Allergy   . Asthma    "all the time, throughout the yr"  . 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 Breast cancer, right breast ([place]) 02/2016  . Eczema   . Family history of breast cancer   . Family history of genetic disease carrier    paternal relatives with a BRCA mutation  . Family history of ovarian cancer   . Hypertension   . STD (sexually transmitted disease)    HSV II    SURGICAL HISTORY: Past Surgical History:  Procedure Laterality Date  . BREAST BIOPSY Right 04/13/2016   Procedure: Evacuation RIGHT Breast  Hematoma;  Surgeon: [name], MD;  Location: [facility];  Service: General;  Laterality: Right;  . DILATION AND CURETTAGE OF UTERUS  2008  . MASTECTOMY Left 04/12/2016   prophylactic total mastectomy  . MASTECTOMY COMPLETE / SIMPLE W/ SENTINEL NODE BIOPSY Right 04/12/2016   axillary sentinel LNB  . MASTECTOMY W/ SENTINEL NODE BIOPSY Bilateral 04/12/2016   Procedure: BILATERAL TOTAL MASTECTOMIES  WITH RIGHT SENTINEL LYMPH NODE BIOPSY;  Surgeon: [name], MD;  Location: [facility];  Service: General;  Laterality: Bilateral;    SOCIAL HISTORY: Social History   Social History  . Marital status: Married    Spouse name: N/A  . Number of children: N/A  . Years of education: N/A   Occupational History  . Not on file.   Social History Main Topics  . Smoking status: Former Smoker    Packs/day: 1.00    Years: 25.00    Quit date: 02/08/1996  . Smokeless tobacco: Never Used  . Alcohol use 21.6 oz/week    15 Standard drinks or equivalent, 21 Glasses of wine per week  . Drug use: No  . Sexual activity: No   Other Topics Concern  . Not on file   Social History Narrative  . No narrative on file    FAMILY HISTORY: Family History  Problem Relation Age of Onset  . Polymyalgia rheumatica Mother   . Hypertension Father   . Heart disease Father     Dec 69  . Heart attack Father   . Prostate cancer Father       Dx 33s; deceased 58  . Breast cancer Cousin     Dx 31s; daughter of a paternal uncle  . Ovarian cancer Paternal Aunt     Dx 11; deceased  . Ovarian cancer Cousin     Dx 5s; daughter of a paternal uncle  . Ovarian cancer Cousin     Dx 30; daughter of a paternal uncle  . Ovarian cancer Paternal Aunt     Dx 28s.  BRCA positive  . Ovarian cancer Other     pat grandfather's mother    ALLERGIES:  has No Known Allergies.  MEDICATIONS:  Current Outpatient Prescriptions  Medication Sig Dispense Refill  . aspirin 81 MG tablet Take 81 mg by mouth daily.    . cetirizine (ZYRTEC) 10 MG tablet Take 10 mg by mouth daily.    . cloNIDine (CATAPRES) 0.2 MG tablet Take 1 tablet  by mouth at bedtime as needed.  0  . escitalopram (LEXAPRO) 10 MG tablet Take 1 tablet by mouth daily.  0  . losartan (COZAAR) 100 MG tablet Take 1 tablet (100 mg total) by mouth daily. 30 tablet 11  . magnesium oxide (MAG-OX) 400 MG tablet Take 400 mg by mouth every other day.    . montelukast (SINGULAIR) 10 MG tablet TAKE 1 TABLET(10 MG) BY MOUTH DAILY (Patient taking differently: TAKE 1 TABLET BY MOUTH AS NEEDED FOR SHORTNESS OF BREATH) 90 tablet 0  . Multiple Vitamins-Minerals (MULTIVITAMIN WITH MINERALS) tablet Take 1 tablet by mouth daily.    . Omega-3 Fatty Acids ([employer] FISH OIL) 1200 MG CAPS Take 1,200 mg by mouth daily.    Marland Kitchen oxyCODONE (OXY IR/ROXICODONE) 5 MG immediate release tablet Take 1-2 tablets (5-10 mg total) by mouth every 4 (four) hours as needed for moderate pain. 25 tablet 0  . valACYclovir (VALTREX) 500 MG tablet Take 1 tablet (500 mg total) by mouth 2 (two) times daily. Take for 3 days as needed. (Patient not taking: Reported on 04/29/2016) 30 tablet 1  . venlafaxine XR (EFFEXOR-XR) 75 MG 24 hr capsule TAKE 2 CAPSULES BY MOUTH EVERY MORNING AND 1 CAPSULE EVERY EVENING 270 capsule 0  . vitamin B-12 (CYANOCOBALAMIN) 100 MCG tablet Take 100 mcg by mouth daily.     No current facility-administered  medications for this visit.     REVIEW OF SYSTEMS:   Constitutional: Denies fevers, chills or abnormal night sweats Eyes: Denies blurriness of vision, double vision or watery eyes Ears, nose, mouth, throat, and face: Denies mucositis or sore throat Respiratory: Denies cough, dyspnea or wheezes Cardiovascular: Denies palpitation, chest discomfort or lower extremity swelling Gastrointestinal:  Denies nausea, heartburn or change in bowel habits Skin: Denies abnormal skin rashes Lymphatics: Denies new lymphadenopathy or easy bruising Neurological:Denies numbness, tingling or new weaknesses Behavioral/Psych: Mood is stable, no new changes  All other systems were reviewed with the patient and are negative.  PHYSICAL EXAMINATION:  ECOG PERFORMANCE STATUS: 0 - Asymptomatic  Vitals:   05/05/16 1523  BP: (!) 145/73  Pulse: 81  Resp: 18  Temp: 98 F (36.7 C)   Filed Weights   05/05/16 1523  Weight: 151 lb 14.4 oz (68.9 kg)    GENERAL:alert, no distress and comfortable SKIN: skin color, texture, turgor are normal, no rashes or significant lesions EYES: normal, conjunctiva are pink and non-injected, sclera clear OROPHARYNX:no exudate, no erythema and lips, buccal mucosa, and tongue normal  NECK: supple, thyroid normal size, non-tender, without nodularity LYMPH:  no palpable lymphadenopathy in the cervical, axillary or inguinal LUNGS: clear to auscultation and percussion with normal breathing effort HEART: regular rate & rhythm and no murmurs and no lower extremity edema ABDOMEN:abdomen soft, non-tender and normal bowel sounds Musculoskeletal:no cyanosis of digits and no clubbing  PSYCH: alert & oriented x 3 with fluent speech NEURO: no focal motor/sensory deficits Breasts: Breast inspection showed Status post bilateral mastectomy, surgical incisions are healing well. She has diffuse skin erythema around the incision on the right side chest wall, and mild soft tissue fullness at his  right axilla secondary to surgery.   LABORATORY DATA:  I have reviewed the data as listed CBC Latest Ref Rng & Units 04/13/2016 04/08/2016 03/16/2016  WBC 4.0 - 10.5 K/uL 8.8 4.9 5.6  Hemoglobin 12.0 - 15.0 g/dL 9.0(L) 12.8 13.4  Hematocrit 36.0 - 46.0 % 27.1(L) 37.9 40.8  Platelets 150 - 400 K/uL 197 211 236  CMP Latest Ref Rng & Units 04/08/2016 03/16/2016 11/16/2015  Glucose 65 - 99 mg/dL 90 99 89  BUN 6 - 20 mg/dL 13 15.7 18  Creatinine 0.44 - 1.00 mg/dL 0.64 0.7 0.80  Sodium 135 - 145 mmol/L 138 141 138  Potassium 3.5 - 5.1 mmol/L 4.2 4.2 4.1  Chloride 101 - 111 mmol/L 103 - 101  CO2 22 - 32 mmol/L 27 29 28   Calcium 8.9 - 10.3 mg/dL 9.5 10.0 9.4  Total Protein 6.4 - 8.3 g/dL - 7.3 6.8  Total Bilirubin 0.20 - 1.20 mg/dL - 0.45 0.3  Alkaline Phos 40 - 150 U/L - 85 75  AST 5 - 34 U/L - 23 19  ALT 0 - 55 U/L - 19 14    PATHOLOGY  Diagnosis 03/08/2016 1. Breast, right, needle core biopsy, 12:00 o'clock - INVASIVE MAMMARY CARCINOMA. - SEE COMMENT. 2. Lymph node, needle/core biopsy, right axilla - THERE IS NO EVIDENCE OF CARCINOMA IN 1 OF 1 LYMPH NODE (0/1). Microscopic Comment 1. The carcinoma appears at least grade 2. An E-cadherin stain will be performed and the results reported separately. A breast prognostic profile will be performed and the results reported separately. The results were called to [facility] on 03/09/2016. PROGNOSTIC INDICATORS Estrogen Receptor: 20%, POSITIVE, WEAK STAINING INTENSITY Progesterone Receptor: 0%, NEGATIVE Proliferation Marker Ki67: 20% HER2 - NEGATIVE 1. The malignant cells are positive for E-Cadherin, supporting a ductal phenotype. (JBK:kh 03/09/16)  RADIOGRAPHIC STUDIES: I have personally reviewed the radiological images as listed and agreed with the findings in the report. No results found.  ASSESSMENT & PLAN:  61 year old postmenopausal woman, presented with screening discovered right breast cancer  1. Malignant  neoplasm of central portion of  right breast, invasive ductal carcinoma, G2, pT1cN0M0 ER weakly positive, PR and HER-2 negative. Oncotype RS 49, high risk  - Patient has had bilateral total mastectomy on 04/12/16 and denied reconstruction.  -I reviewed her surgical pathology findings with patient and her husband in details. Her surgical margins were negative, 3 sentinel lymph nodes were also negative. - Her oncotype is back at 49. This is fairly high risk disease based on the RS. the estimated 10 year risk of distant recurrence with tamoxifen alone is 32%. I strongly recommend chemotherapy to reduce her risk of cancer recurrence after surgery (by ~50%). - We discussed the adjuvant chemo regimens, which includes dose dense H myself and Cytoxan, every 2 weeks, for 4 cycles, followed by Taxol  (weekly for 12 weeks or biweekly for 4 cycles), or  docetaxel and Cytoxan (TC) intravenously every 3 weeks for 4-6 cycles. Patient expressed her wishes to pursue aggressive treatment. After a lengthy discussion, pt decided to take AC-T. --Chemotherapy consent: Side effects including but does not not limited to, fatigue, nausea, vomiting, diarrhea, hair loss, neuropathy, fluid retention, renal and kidney dysfunction, neutropenic fever, needed for blood transfusion, bleeding, 10-15% risk of congestive heart failure, small risk of MDS or leukemia, were discussed with patient in great detail. She agrees to proceed. -We discussed that the goal of therapy is curative. - Echo will be done before to monitor heart before starting chemo treatment.  - Chemo intended to start week April 12, patient and husband agree to start chemo ASAP - I have provided the patient with print outs about the difference chemo drugs - I have advised the patient not to drive the day of chemo. She should have a driver to come in with her - I have advised  her to continue exercising and healthy diet -chemo class before first dose chemo -port placement    2. BRCA1 mutation (+) -Due to her strong family history of ovarian cancer and also positive family history of breast cancer, she underwent genetic testing - Patient is positive for BRCA1 Mutation -We discussed the high risk of breast cancer and colon cancer due to the BRCA1 mutation. She is status post bilateral mastectomy. She is agreeable to have BSO, she has discussed with her gynecologist. I recommend her to have this surgery done after she completes adjuvant chemotherapy. - Her sister has done genetic testing, results pending -She has no children.  3. Right chest wall cellulitis -She is on antibiotics, follow up with [name]  Plan:  -port placement by [name] or IR - 1st chemo treatment [facility] on April 12th along with lab, flush and f/u - echocardiogram - chemo class week of April 9th  Orders Placed This Encounter  Procedures  . ECHOCARDIOGRAM COMPLETE    Standing Status:   Future    Standing Expiration Date:   08/05/2017    Order Specific Question:   Where should this test be performed    Answer:   [facility]    Order Specific Question:   Complete or Limited study?    Answer:   Complete    Order Specific Question:   Does the patient have a known history of hypersensitivity to Perflutren (aka Scientist, research (medical) for echocardiograms - CHECK ALLERGIES)    Answer:   No    Order Specific Question:   ADMINISTER PERFLUTERN    Answer:   ADMINISTER PERFLUTREN    Order Specific Question:   Expected Date:    Answer:   1 week    Order Specific Question:   Reason for exam-Echo    Answer:   Chemo  V67.2 / Z09    All questions were answered. The patient knows to call the clinic with any problems, questions or concerns. I spent 25 minutes counseling the patient face to face. The total time spent in the appointment was 30 minutes and more than 50% was on counseling.  This document serves as a record of services personally performed by Truitt Merle, MD. It was created on  her behalf by Brandt Loosen, a trained medical scribe. The creation of this record is based on the scribe's personal observations and the provider's statements to them. This document has been checked and approved by the attending provider.   Truitt Merle, MD 03/16/2016

## 2016-05-06 ENCOUNTER — Encounter (HOSPITAL_COMMUNITY): Payer: Self-pay

## 2016-05-06 ENCOUNTER — Telehealth: Payer: Self-pay | Admitting: Hematology

## 2016-05-06 NOTE — Telephone Encounter (Signed)
sw pt to confirm echo appt 4/3 at 9 am per LOS

## 2016-05-06 NOTE — Telephone Encounter (Signed)
Confirmed 4/9 and 4/12 appt date/times per LOS

## 2016-05-09 ENCOUNTER — Other Ambulatory Visit: Payer: Self-pay | Admitting: General Surgery

## 2016-05-09 ENCOUNTER — Telehealth: Payer: Self-pay | Admitting: Obstetrics and Gynecology

## 2016-05-09 ENCOUNTER — Ambulatory Visit: Payer: BLUE CROSS/BLUE SHIELD

## 2016-05-09 NOTE — Telephone Encounter (Signed)
Return call to patient. States since her last visit with Dr Quincy Simmonds, she has found out she will need chemo for 6 months.  She has also decided she wants to plan for Lap BSO but does not want hysterectomy. She was told she could have surgery in October or November. Discussed sugrery date options for November. Advised that with potential for chemo to be delayed and the need to have a couple of weeks between end of chemo and surgical procedure, should likely aim for at least November instead of October. Recommend office visit approximately September to update with Dr Quincy Simmonds. Patient will call me with updates and/or changes.   Advised Dr Quincy Simmonds will review call and if any different recommendations, will call her back.   Routing to Dr Quincy Simmonds for review.

## 2016-05-09 NOTE — Telephone Encounter (Signed)
I agree with your recommendations for follow up in September 2018 for planning for lap BSO before the end of the year. Patient may schedule now if she desires or call back to schedule.

## 2016-05-09 NOTE — Telephone Encounter (Signed)
Patient starting chemo on 05/19/16 for about 6 months.  She would like to have just her ovaries and tubes removed and can do that October or November.

## 2016-05-10 ENCOUNTER — Encounter: Payer: Self-pay | Admitting: *Deleted

## 2016-05-10 ENCOUNTER — Telehealth: Payer: Self-pay | Admitting: Hematology

## 2016-05-10 ENCOUNTER — Ambulatory Visit (HOSPITAL_COMMUNITY)
Admission: RE | Admit: 2016-05-10 | Discharge: 2016-05-10 | Disposition: A | Discharge status: Home / Self Care | Payer: BLUE CROSS/BLUE SHIELD | Source: Ambulatory Visit | Attending: Hematology | Admitting: Hematology

## 2016-05-10 DIAGNOSIS — Z09 Encounter for follow-up examination after completed treatment for conditions other than malignant neoplasm: Secondary | ICD-10-CM | POA: Insufficient documentation

## 2016-05-10 DIAGNOSIS — Z170 Estrogen receptor positive status [ER+]: Secondary | ICD-10-CM | POA: Diagnosis not present

## 2016-05-10 DIAGNOSIS — C50111 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 female breast: Secondary | ICD-10-CM | POA: Insufficient documentation

## 2016-05-10 DIAGNOSIS — I10 Essential (primary) hypertension: Secondary | ICD-10-CM | POA: Diagnosis not present

## 2016-05-10 HISTORY — PX: TRANSTHORACIC ECHOCARDIOGRAM: SHX275

## 2016-05-10 NOTE — Telephone Encounter (Signed)
Call to patient. Advised patient Dr Quincy Simmonds has reviewed call and agreeable to plan. Patient declines to schedule appointment at this time. States she will call with update in early September and schedule appointment then depending on how she is doing with chemo and when treatments are projected to be completed.   Encounter closed.

## 2016-05-10 NOTE — Progress Notes (Signed)
  Echocardiogram 2D Echocardiogram has been performed.  Darlina Sicilian M 05/10/2016, 9:49 AM

## 2016-05-10 NOTE — Telephone Encounter (Signed)
Unable to reach patient - left message with appt time and date. r/s per [facility] Schedule message.

## 2016-05-11 ENCOUNTER — Ambulatory Visit: Payer: BLUE CROSS/BLUE SHIELD | Attending: General Surgery | Admitting: Physical Therapy

## 2016-05-11 DIAGNOSIS — M25611 Stiffness of right shoulder, not elsewhere classified: Secondary | ICD-10-CM | POA: Diagnosis not present

## 2016-05-11 DIAGNOSIS — Z170 Estrogen receptor positive status [ER+]: Secondary | ICD-10-CM | POA: Insufficient documentation

## 2016-05-11 DIAGNOSIS — C50111 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 female breast: Secondary | ICD-10-CM | POA: Diagnosis not present

## 2016-05-11 DIAGNOSIS — M6281 Muscle weakness (generalized): Secondary | ICD-10-CM | POA: Diagnosis not present

## 2016-05-11 DIAGNOSIS — M25612 Stiffness of left shoulder, not elsewhere classified: Secondary | ICD-10-CM | POA: Diagnosis not present

## 2016-05-11 DIAGNOSIS — R600 Localized edema: Secondary | ICD-10-CM

## 2016-05-11 NOTE — Therapy (Signed)
[place], [place], [address] Phone: [phone]   Fax:  [phone]  Physical Therapy Treatment  Patient Details  Name: Chelsea Salinas MRN: [id number] Date of Birth: December 27, 1955 Referring Provider: [name]  Encounter Date: 05/11/2016      PT End of Session - 05/11/16 1036    Visit Number 2   Number of Visits 9   Date for PT Re-Evaluation 06/01/16   PT Start Time 0932   PT Stop Time 1018   PT Time Calculation (min) 46 min   Activity Tolerance Patient tolerated treatment well   Behavior During Therapy [facility] for tasks assessed/performed      Past Medical History:  Diagnosis Date  . Abnormal Pap smear of cervix    --in her 20s had colpo and some type of treatment to cervix.  . Allergy   . Asthma    "all the time, throughout the yr"  . 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 Breast cancer, right breast ([place]) 02/2016  . Eczema   . Family history of breast cancer   . Family history of genetic disease carrier    paternal relatives with a BRCA mutation  . Family history of ovarian cancer   . Hypertension   . STD (sexually transmitted disease)    HSV II    Past Surgical History:  Procedure Laterality Date  . BREAST BIOPSY Right 04/13/2016   Procedure: Evacuation RIGHT Breast  Hematoma;  Surgeon: [name], MD;  Location: [place];  Service: General;  Laterality: Right;  . DILATION AND CURETTAGE OF UTERUS  2008  . MASTECTOMY Left 04/12/2016   prophylactic total mastectomy  . MASTECTOMY COMPLETE / SIMPLE W/ SENTINEL NODE BIOPSY Right 04/12/2016   axillary sentinel LNB  . MASTECTOMY W/ SENTINEL NODE BIOPSY Bilateral 04/12/2016   Procedure: BILATERAL TOTAL MASTECTOMIES  WITH RIGHT SENTINEL LYMPH NODE BIOPSY;  Surgeon: [name], MD;  Location: [place];  Service: General;  Laterality: Bilateral;    There were no vitals filed for this visit.       Subjective Assessment - 05/11/16 0942    Subjective "I did a yoga class yesterday"  she attended ABC class and has no    Pertinent History Patient was diagnosed on 02/29/16 with right grade 2 invasive ductal carcinoma breast cancer. It measures 1.7 cm and is located in the upper outer quadrant.  It is ER positive, PR negative and HER2 negative with a Ki67 of 20%, pt is waiting to find out if she will need chemotherapy, she does not think she will require any radiation   Patient Stated Goals to resume normal activities as quickly as possible   Currently in Pain? No/denies            [facility] PT Assessment - 05/11/16 0001      Observation/Other Assessments   Skin Integrity pt continues with a few steristrips on incisions, she has obvious fullness in both axilla more on right than left., redness around right incsion that pt states is resolving.                      [facility] Adult PT Treatment/Exercise - 05/11/16 0001      Shoulder Exercises: Supine   Flexion Strengthening;Both;5 reps   Theraband Level (Shoulder Flexion) Level 1 (Yellow)   Other Supine Exercises meeks decompression exercise x 3 reps.    Other Supine Exercises verbally reviewed other  supine scapular series exercises for pt to progress to at a later time, pt aware and will not progress the exercise if she feels pain      Shoulder Exercises: Sidelying   ABduction AROM;Left   Other Sidelying Exercises small circles with control 5 reps in each direction.      Manual Therapy   Manual Therapy Edema management;Manual Lymphatic Drainage (MLD);Taping   Edema Management 1/2 foam pieces given for pt to wear inside tight shirt to provide compression at edema at anterior axilla    Manual Lymphatic Drainage (MLD) insturcted in lymphatic system.  short neck, diaphragmatic breathing.  did not do right chest or axilla because of redness /cellulitis at chest.  left inguinal nodes and left axillo-inguinal anastamosis, extra time  spent on left axillary swelling    Kinesiotex Edema     Kinesiotix   Edema skin kote applied to left axilla and lateral trunk and 4 pronged fan shape applied from edema at axilla to lateral trunk.                 PT Education - 05/11/16 1035    Education provided Yes   Education Details meeks decompression and supine scapualar series with yellow and red theraband to progress to, kinesiotape removal    Person(s) Educated Patient   Methods Explanation;Demonstration;Handout   Comprehension Verbalized understanding;Returned demonstration              Breast Clinic Goals - 03/16/16 1705      Patient will be able to verbalize understanding of pertinent lymphedema risk reduction practices relevant to her diagnosis specifically related to skin care.   Time 1   Period Days   Status Achieved     Patient will be able to return demonstrate and/or verbalize understanding of the post-op home exercise program related to regaining shoulder range of motion.   Time 1   Period Days   Status Achieved     Patient will be able to verbalize understanding of the importance of attending the postoperative After Breast Cancer Class for further lymphedema risk reduction education and therapeutic exercise.   Time 1   Period Days   Status Achieved          Long Term Clinic Goals - 05/04/16 1151      CC Long Term Goal  #1   Title Pt to independently verbalize lymphedema risk reduction practices   Time 4   Period Weeks   Status New     CC Long Term Goal  #2   Title Pt to demonstrate 4/5 right tricep strength to allow her to return to prior level of function   Baseline 3+/5   Time 4   Period Weeks   Status New     CC Long Term Goal  #3   Title Pt to be independent in a home exercise program for continued strengthening and stretching   Time 4   Period Weeks   Status New     CC Long Term Goal  #4   Title Pt to report at least a 70 percent improvement in right axillary swelling  to improve comfort   Time 4   Period Weeks   Status New     CC Long Term Goal  #5   Title Pt will demonstrate 165 degrees of right shoulder flexion to allow her to reach items overhead   Baseline 151   Time 4   Period Weeks   Status New  Plan - 05/11/16 1036    Clinical Impression Statement Pt is advancing home exercise to walking and yoga and has resoving cellulitis on right chest.  She was instructed in exercise for posture and scapular strengthening and has a good knowledge of how to progress.  Treatment for edema in both axilla was started today with instruction on how to use foam inside a tight shirt and keep bra pulled up as she seems to be having fluid collect on top of bra edge.  Also tried kinesiotape and pt will monitor how she does with this at home and report back next session.    Rehab Potential Excellent   Clinical Impairments Affecting Rehab Potential none   PT Frequency 2x / week   PT Duration 4 weeks   PT Treatment/Interventions Patient/family education;Therapeutic exercise;Manual techniques;Manual lymph drainage;Scar mobilization;Passive range of motion;Taping;ADLs/Self Care Home Management   PT Next Visit Plan Assess how pt swelling has done with foam and ktape, focus on instruction of self MLD.  remeasure shoulders and teach triceps strengthening to address those goals    Consulted and Agree with Plan of Care Patient      Patient will benefit from skilled therapeutic intervention in order to improve the following deficits and impairments:  Decreased knowledge of precautions, Pain, Impaired UE functional use, Decreased range of motion, Increased edema, Decreased scar mobility, Decreased strength  Visit Diagnosis: Stiffness of right shoulder, not elsewhere classified  Stiffness of left shoulder, not elsewhere classified  Localized edema  Muscle weakness (generalized)     Problem List Patient Active Problem List   Diagnosis Date Noted  .  Breast cancer, right ([place]) 04/12/2016  . Genetic testing 04/08/2016  . BRCA1 positive   . Family history of breast cancer   . Family history of ovarian cancer   . Family history of genetic disease carrier   . Breast cancer ([place])   . 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 breast in female, estrogen receptor positive ([place]) 03/15/2016  . Vitamin D deficiency 05/18/2015  . Hot flashes 05/18/2015  . Hypertension   . Asthma   . Allergy   . Eczema    Donato Heinz. Owens Shark PT  Norwood Levo 05/11/2016, 10:42 AM  [place], [place], [address] Phone: [phone]   Fax:  [phone]  Name: Chelsea Salinas MRN: [id number] Date of Birth: April 08, 1955

## 2016-05-11 NOTE — Patient Instructions (Addendum)
1. Decompression Exercise     Cancer Rehab [phone]    Lie on back on firm surface, knees bent, feet flat, arms turned up, out to sides, backs of hands down. Time _5-15__ minutes. Surface: floor   2. Shoulder Press    Start in Decompression Exercise position. Press shoulders downward towards supporting surface. Hold __2-3__ seconds while counting out loud. Repeat _3-5___ times. Do _1-2___ times per day.   3. Head Press    Bring cervical spine (neck) into neutral position (by either tucking the chin towards the chest or tilting the chin upward). Feel weight on back of head. Press head downward into supporting surface.    Hold _2-3__ seconds. Repeat _3-5__ times. Do _1-2__ times per day.   4. Leg Lengthener    Straighten one leg. Pull toes AND forefoot toward knee, extend heel. Lengthen leg by pulling pelvis away from ribs. Hold _2-3__ seconds. Relax. Repeat __4-6__ times. Do other leg.  Surface: floor   5. Leg Press    Straighten one leg down to floor keeping leg aligned with hip. Pull toes AND forefoot toward knee; extend heel.  Press entire leg downward (as if pressing leg into sandy beach). DO NOT BEND KNEE. Hold _2-3__ seconds. Do __4-6__ times. Repeat with other leg.         HOLD OFF ON THESE UNTIL SWELLING GOES DOWN------ LISTEN TO WHAT YOUR BODY IS TELLING YOU   Over Head Pull: Narrow and Wide Grip   Cancer Rehab [phone]   On back, knees bent, feet flat, band across thighs, elbows straight but relaxed. Pull hands apart (start). Keeping elbows straight, bring arms up and over head, hands toward floor. Keep pull steady on band. Hold momentarily. Return slowly, keeping pull steady, back to start. Then do same with a wider grip on the band (past shoulder width) Repeat _5-10__ times. Band color __yellow____   Side Pull: Double Arm   On back, knees bent, feet flat. Arms perpendicular to body, shoulder level, elbows straight but relaxed. Pull arms out to sides,  elbows straight. Resistance band comes across collarbones, hands toward floor. Hold momentarily. Slowly return to starting position. Repeat _5-10__ times. Band color _yellow____   Sword   On back, knees bent, feet flat, left hand on left hip, right hand above left. Pull right arm DIAGONALLY (hip to shoulder) across chest. Bring right arm along head toward floor. Hold momentarily. Slowly return to starting position. Repeat _5-10__ times. Do with left arm. Band color _yellow_____   Shoulder Rotation: Double Arm   Ok to do this now with no theraband    On back, knees bent, feet flat, elbows tucked at sides, bent 90, hands palms up. Pull hands apart and down toward floor, keeping elbows near sides. Hold momentarily. Slowly return to starting position. Repeat _5-10__ times. Band color __yellow____   -----------------------------------------------------------------------------------------------------  Kinesiotape should stay on your body for a few days.  It's OK to shower with it on.  Just pat it dry afterwards.    While the tape is on, keep an eye on the skin around it.  If you feel any itching, see any redness or feel in any way that the tape is irritating your skin, it is time to gently remove it.   To loosen the tape from your skin, use something oily like olive oil or baby oil on a cotton ball.  Gently roll the edge of the tape away from the skin.  Then, hold the edge of the tape  away from the skin and gently press the skin away from the tape.  Do not pull the tape off the skin or you may cause skin irritation.  Then, inspect your skin.  Gently cleanse with soap and water

## 2016-05-13 ENCOUNTER — Inpatient Hospital Stay (HOSPITAL_COMMUNITY)
Admission: RE | Admit: 2016-05-13 | Discharge: 2016-05-13 | Disposition: A | Discharge status: Home / Self Care | Payer: BLUE CROSS/BLUE SHIELD | Source: Ambulatory Visit

## 2016-05-13 ENCOUNTER — Encounter: Payer: Self-pay | Admitting: Physical Therapy

## 2016-05-13 ENCOUNTER — Ambulatory Visit: Payer: BLUE CROSS/BLUE SHIELD | Admitting: Physical Therapy

## 2016-05-13 DIAGNOSIS — M25611 Stiffness of right shoulder, not elsewhere classified: Secondary | ICD-10-CM

## 2016-05-13 DIAGNOSIS — M25612 Stiffness of left shoulder, not elsewhere classified: Secondary | ICD-10-CM

## 2016-05-13 DIAGNOSIS — M6281 Muscle weakness (generalized): Secondary | ICD-10-CM

## 2016-05-13 DIAGNOSIS — Z170 Estrogen receptor positive status [ER+]: Secondary | ICD-10-CM | POA: Diagnosis not present

## 2016-05-13 DIAGNOSIS — R600 Localized edema: Secondary | ICD-10-CM

## 2016-05-13 DIAGNOSIS — C50111 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 female breast: Secondary | ICD-10-CM | POA: Diagnosis not present

## 2016-05-13 NOTE — Therapy (Signed)
[facility] [address] [place], [place], [address] Phone: [phone]   Fax:  [phone]  Physical Therapy Treatment  Patient Details  Name: Chelsea Salinas MRN: [id number] Date of Birth: 01/11/1956 Referring Provider: Dr. Wakefield  Encounter Date: 05/13/2016      PT End of Session - 05/13/16 1019    Visit Number 3   Number of Visits 9   Date for PT Re-Evaluation 06/01/16   PT Start Time 0936   PT Stop Time 1019   PT Time Calculation (min) 43 min   Activity Tolerance Patient tolerated treatment well   Behavior During Therapy WFL for tasks assessed/performed      Past Medical History:  Diagnosis Date  . Abnormal Pap smear of cervix    --in her 20s had colpo and some type of treatment to cervix.  . Allergy   . Asthma    "all the time, throughout the yr"  . 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 Breast cancer, right breast ([facility]) 02/2016  . Eczema   . Family history of breast cancer   . Family history of genetic disease carrier    paternal relatives with a BRCA mutation  . Family history of ovarian cancer   . Hypertension   . STD (sexually transmitted disease)    HSV II    Past Surgical History:  Procedure Laterality Date  . BREAST BIOPSY Right 04/13/2016   Procedure: Evacuation RIGHT Breast  Hematoma;  Surgeon: Matthew Wakefield, MD;  Location[facility];  Service: General;  Laterality: Right;  . DILATION AND CURETTAGE OF UTERUS  2008  . MASTECTOMY Left 04/12/2016   prophylactic total mastectomy  . MASTECTOMY COMPLETE / SIMPLE W/ SENTINEL NODE BIOPSY Right 04/12/2016   axillary sentinel LNB  . MASTECTOMY W/ SENTINEL NODE BIOPSY Bilateral 04/12/2016   Procedure: BILATERAL TOTAL MASTECTOMIES  WITH RIGHT SENTINEL LYMPH NODE BIOPSY;  Surgeon: Matthew Wakefield, MD;  Location: [facility];  Service: General;  Laterality: Bilateral;    There were no vitals filed for this visit.       Subjective Assessment - 05/13/16 0938    Subjective I am having my port put in on Thursday morning and I start chemo next Friday. I want you to look at my cellulitis to see if it is better and if I need to call the doctor back. Ive been to 3 yoga class and I have walked 3 miles. The foam did not stay in place at all. The kinesiotape seemed to help.    Pertinent History Patient was diagnosed on 02/29/16 with right grade 2 invasive ductal carcinoma breast cancer. It measures 1.7 cm and is located in the upper outer quadrant.  It is ER positive, PR negative and HER2 negative with a Ki67 of 20%, pt is waiting to find out if she will need chemotherapy, she does not think she will require any radiation   Patient Stated Goals to resume normal activities as quickly as possible   Currently in Pain? No/denies   Pain Score 0-No pain                         [facility] Adult PT Treatment/Exercise - 05/13/16 0001      Shoulder Exercises: Supine   Horizontal ABduction Strengthening;Both;10 reps;Theraband   Theraband Level (Shoulder Horizontal ABduction) Level 2 (Red)   External Rotation Strengthening;Both;10 reps;Theraband   Theraband Level (Shoulder External Rotation) Level 2 (  Red)   Internal Rotation Strengthening;Both;10 reps;Theraband   Theraband Level (Shoulder Internal Rotation) Level 2 (Red)   Flexion Strengthening;Both;10 reps;Theraband   Theraband Level (Shoulder Flexion) Level 2 (Red)     Manual Therapy   Manual Lymphatic Drainage (MLD) insturcted pt throughout:.  short neck, diaphragmatic breathing. left inguinal nodes and left axillo-inguinal anastamosis, extra time spent on left axillary swelling                     Long Term Clinic Goals - 05/04/16 1151      CC Long Term Goal  #1   Title Pt to independently verbalize lymphedema risk reduction practices   Time 4   Period Weeks   Status New     CC Long Term Goal  #2   Title Pt to demonstrate 4/5 right tricep  strength to allow her to return to prior level of function   Baseline 3+/5   Time 4   Period Weeks   Status New     CC Long Term Goal  #3   Title Pt to be independent in a home exercise program for continued strengthening and stretching   Time 4   Period Weeks   Status New     CC Long Term Goal  #4   Title Pt to report at least a 70 percent improvement in right axillary swelling to improve comfort   Time 4   Period Weeks   Status New     CC Long Term Goal  #5   Title Pt will demonstrate 165 degrees of right shoulder flexion to allow her to reach items overhead   Baseline 151   Time 4   Period Weeks   Status New            Plan - 05/13/16 1036    Clinical Impression Statement Advanced pt's supine scap exercises to red theraband today and had pt demonstrate 10 reps of each. She states she has not been doing these much at home. Pt's redness in her right chest has really improved so today MLD was initiated to R and chest and continued on L chest for management of axillary swelling. Educated pt throughout and pt verbalized understanding. Educated pt to perform self drainage twice a day.   Rehab Potential Excellent   Clinical Impairments Affecting Rehab Potential none   PT Frequency 2x / week   PT Duration 4 weeks   PT Treatment/Interventions Patient/family education;Therapeutic exercise;Manual techniques;Manual lymph drainage;Scar mobilization;Passive range of motion;Taping;ADLs/Self Care Home Management   PT Next Visit Plan assess indep with self MLD,  remeasure shoulders and teach triceps strengthening to address those goals, kinesiotape if needed   PT Home Exercise Plan Post op shoulder ROM HEP, self MLD, supine scap series   Consulted and Agree with Plan of Care Patient      Patient will benefit from skilled therapeutic intervention in order to improve the following deficits and impairments:  Decreased knowledge of precautions, Pain, Impaired UE functional use, Decreased  range of motion, Increased edema, Decreased scar mobility, Decreased strength  Visit Diagnosis: Stiffness of right shoulder, not elsewhere classified  Stiffness of left shoulder, not elsewhere classified  Muscle weakness (generalized)  Localized edema     Problem List Patient Active Problem List   Diagnosis Date Noted  . Breast cancer, right ([place]) 04/12/2016  . Genetic testing 04/08/2016  . BRCA1 positive   . Family history of breast cancer   . Family history of ovarian cancer   .  Family history of genetic disease carrier   . Breast cancer ([facility])   . 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 breast in female, estrogen receptor positive ([facility]) 03/15/2016  . Vitamin D deficiency 05/18/2015  . Hot flashes 05/18/2015  . Hypertension   . Asthma   . Allergy   . Eczema     Robey Massmann Breedlove Blue 05/13/2016, 10:40 AM  [facility] [address] [place], [place], [address] Phone: [phone]   Fax:  [phone]  Name: Gaylin R Davie MRN: [id number] Date of Birth: 03/13/1955  Naveyah Iacovelli Breedlove Blue, PT 05/13/16 10:41 AM  

## 2016-05-13 NOTE — Pre-Procedure Instructions (Signed)
Chelsea Salinas  05/13/2016      [facility], [place] AT [address] [address] Phone: [phone] Fax: [phone]    Your procedure is scheduled on Thurs, April `12 @ 7:30 AM  Report to [facility] Admitting at 5:30 AM  Call this number if you have problems the morning of surgery:  [phone]   Remember:  Do not eat food or drink liquids after midnight.  Take these medicines the morning of surgery with A SIP OF WATER Doxycycline(Vibramycin),Flonase(Fluticasone),Singulair(Montelukast),and Effexor(Venlafaxine)              Stop taking your Aspirin and Fish Oil now along with any Vitamins or Herbal Medications. No Goody's,BC's,Aleve,Advil,Motrin,or Ibuprofen.    Do not wear jewelry, make-up or nail polish.  Do not wear lotions, powders, perfumes, or deoderant.  Do not shave 48 hours prior to surgery.    Do not bring valuables to the hospital.  [facility] is not responsible for any belongings or valuables.  Contacts, dentures or bridgework may not be worn into surgery.  Leave your suitcase in the car.  After surgery it may be brought to your room.  For patients admitted to the hospital, discharge time will be determined by your treatment team.  Patients discharged the day of surgery will not be allowed to drive home.    [facility]  Before surgery, you can play an important role.  Because skin is not sterile, your skin needs to be as free of germs as possible.  You can reduce the number of germs on you skin by washing with CHG (chlorahexidine gluconate) soap before surgery.  CHG is an antiseptic cleaner which kills germs and bonds with the skin to continue killing germs even after washing.  Please DO NOT use if you have an allergy to CHG or antibacterial soaps.  If your skin becomes reddened/irritated stop using the CHG and inform  your nurse when you arrive at Short Stay.  Do not shave (including legs and underarms) for at least 48 hours prior to the first CHG shower.  You may shave your face.  Please follow these instructions carefully:   1.  Shower with CHG Soap the night before surgery and the                                morning of Surgery.  2.  If you choose to wash your hair, wash your hair first as usual with your       normal shampoo.  3.  After you shampoo, rinse your hair and body thoroughly to remove the                      Shampoo.  4.  Use CHG as you would any other liquid soap.  You can apply chg directly       to the skin and wash gently with scrungie or a clean washcloth.  5.  Apply the CHG Soap to your body ONLY FROM THE NECK DOWN.        Do not use on open wounds or open sores.  Avoid contact with your eyes,       ears, mouth and genitals (private parts).  Wash genitals (private parts)       with  your normal soap.  6.  Wash thoroughly, paying special attention to the area where your surgery        will be performed.  7.  Thoroughly rinse your body with warm water from the neck down.  8.  DO NOT shower/wash with your normal soap after using and rinsing off       the CHG Soap.  9.  Pat yourself dry with a clean towel.            10.  Wear clean pajamas.            11.  Place clean sheets on your bed the night of your first shower and do not        sleep with pets.  Day of Surgery  Do not apply any lotions/deoderants the morning of surgery.  Please wear clean clothes to the hospital/surgery center.  Please read over the following fact sheets that you were given. Pain Booklet, Coughing and Deep Breathing and Surgical Site Infection Prevention

## 2016-05-16 ENCOUNTER — Encounter: Payer: Self-pay | Admitting: Physical Therapy

## 2016-05-16 ENCOUNTER — Ambulatory Visit: Payer: BLUE CROSS/BLUE SHIELD | Admitting: Physical Therapy

## 2016-05-16 ENCOUNTER — Ambulatory Visit: Payer: BLUE CROSS/BLUE SHIELD

## 2016-05-16 DIAGNOSIS — Z9012 Acquired absence of left breast and nipple: Secondary | ICD-10-CM | POA: Diagnosis not present

## 2016-05-16 DIAGNOSIS — C773 Secondary and unspecified malignant neoplasm of axilla and upper limb lymph nodes: Principal | ICD-10-CM

## 2016-05-16 DIAGNOSIS — C50111 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 female breast: Secondary | ICD-10-CM | POA: Diagnosis not present

## 2016-05-16 DIAGNOSIS — M25611 Stiffness of right shoulder, not elsewhere classified: Secondary | ICD-10-CM | POA: Diagnosis not present

## 2016-05-16 DIAGNOSIS — R600 Localized edema: Secondary | ICD-10-CM

## 2016-05-16 DIAGNOSIS — M6281 Muscle weakness (generalized): Secondary | ICD-10-CM

## 2016-05-16 DIAGNOSIS — Z170 Estrogen receptor positive status [ER+]: Secondary | ICD-10-CM | POA: Diagnosis not present

## 2016-05-16 DIAGNOSIS — C50912 Malignant neoplasm of unspecified site of left female breast: Secondary | ICD-10-CM

## 2016-05-16 DIAGNOSIS — M25612 Stiffness of left shoulder, not elsewhere classified: Secondary | ICD-10-CM | POA: Diagnosis not present

## 2016-05-16 DIAGNOSIS — C50411 Malignant neoplasm of upper-outer quadrant of right female breast: Secondary | ICD-10-CM | POA: Diagnosis not present

## 2016-05-16 MED ORDER — LIDOCAINE-PRILOCAINE 2.5-2.5 % EX CREA: 1.0000 "application " | TOPICAL_CREAM | CUTANEOUS | 3 refills | Status: DC | PRN

## 2016-05-16 NOTE — Patient Instructions (Signed)
Self manual lymph drainage: Perform this sequence once a day.  Only give enough pressure no your skin to make the skin move.  Diaphragmatic - Supine   Inhale through nose making navel move out toward hands. Exhale through puckered lips, hands follow navel in. Repeat _5__ times. Rest _10__ seconds between repeats.   Copyright  VHI. All rights reserved.  Hug yourself.  Do circles at your neck just above your collarbones.  Repeat this 10 times.   Copyright  VHI. All rights reserved.  LEG: Inguinal Nodes Stimulation   With small finger side of hand against hip crease on involved side, gently perform circles at the crease. Repeat __10_ times.   Copyright  VHI. All rights reserved.  1) Axilla to Inguinal Nodes - Sweep   On involved side, sweep _4__ times from armpit along side of trunk to hip crease. Elizebeth Koller by doing the pathways as described above going from your involved armpit to the same side groin.  Repeat the steps above where you do circles in your left groin. Copyright  VHI. All rights reserved.

## 2016-05-16 NOTE — Therapy (Signed)
[place], [place], [address] Phone: [phone]   Fax:  [phone]  Physical Therapy Treatment  Patient Details  Name: Chelsea Salinas MRN: [id number] Date of Birth: 1955/11/28 Referring Provider: [name]  Encounter Date: 05/16/2016      PT End of Session - 05/16/16 1354    Visit Number 4   Number of Visits 9   Date for PT Re-Evaluation 06/01/16   PT Start Time 1300   PT Stop Time 1348   PT Time Calculation (min) 48 min   Activity Tolerance Patient tolerated treatment well   Behavior During Therapy [facility] for tasks assessed/performed      Past Medical History:  Diagnosis Date  . Abnormal Pap smear of cervix    --in her 20s had colpo and some type of treatment to cervix.  . Allergy   . Asthma    "all the time, throughout the yr"  . 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 Breast cancer, right breast ([place]) 02/2016  . Eczema   . Family history of breast cancer   . Family history of genetic disease carrier    paternal relatives with a BRCA mutation  . Family history of ovarian cancer   . Hypertension   . STD (sexually transmitted disease)    HSV II    Past Surgical History:  Procedure Laterality Date  . BREAST BIOPSY Right 04/13/2016   Procedure: Evacuation RIGHT Breast  Hematoma;  Surgeon: [name], MD;  Location: [place];  Service: General;  Laterality: Right;  . DILATION AND CURETTAGE OF UTERUS  2008  . MASTECTOMY Left 04/12/2016   prophylactic total mastectomy  . MASTECTOMY COMPLETE / SIMPLE W/ SENTINEL NODE BIOPSY Right 04/12/2016   axillary sentinel LNB  . MASTECTOMY W/ SENTINEL NODE BIOPSY Bilateral 04/12/2016   Procedure: BILATERAL TOTAL MASTECTOMIES  WITH RIGHT SENTINEL LYMPH NODE BIOPSY;  Surgeon: [name], MD;  Location: [place];  Service: General;  Laterality: Bilateral;    There were no vitals filed for this visit.       Subjective Assessment - 05/16/16 1302    Subjective I have been doing the self massage a little bit and my swelling seems to be going down. It has gone down more on the left.    Pertinent History Patient was diagnosed on 02/29/16 with right grade 2 invasive ductal carcinoma breast cancer. It measures 1.7 cm and is located in the upper outer quadrant.  It is ER positive, PR negative and HER2 negative with a Ki67 of 20%, pt is waiting to find out if she will need chemotherapy, she does not think she will require any radiation   Patient Stated Goals to resume normal activities as quickly as possible   Currently in Pain? No/denies   Pain Score 0-No pain                         [facility] Adult PT Treatment/Exercise - 05/16/16 0001      Shoulder Exercises: Seated   Other Seated Exercises overhead tricep extension x 10 reps bilaterally     Manual Therapy   Manual Lymphatic Drainage (MLD) insturcted pt throughout, had pt demonstrate correct technique, educated pt on proper pressure and stretch and issued handout:.  short neck, diaphragmatic breathing. left inguinal nodes and left axillo-inguinal anastamosis, extra time spent on left axillary swelling      Kinesiotix   Edema  skin kote applied to left axilla and lateral trunk and 4 pronged fan shape applied from edema at axilla to lateral trunk.                      [facility] Goals - 05/16/16 1302      CC Long Term Goal  #1   Title Pt to independently verbalize lymphedema risk reduction practices   Baseline 05/16/16- reviewed this with pt again today   Time 4   Period Weeks   Status On-going     CC Long Term Goal  #2   Title Pt to demonstrate 4/5 right tricep strength to allow her to return to prior level of function   Baseline 3+/5   Time 4   Period Weeks   Status On-going     CC Long Term Goal  #3   Title Pt to be independent in a home exercise program for continued strengthening and stretching   Time 4    Period Weeks   Status On-going     CC Long Term Goal  #4   Title Pt to report at least a 70 percent improvement in right axillary swelling to improve comfort   Baseline 05/16/16- 75% improvement   Time 4   Period Weeks   Status Achieved     CC Long Term Goal  #5   Title Pt will demonstrate 165 degrees of right shoulder flexion to allow her to reach items overhead   Baseline 151, 05/16/16- 158 degrees   Time 4   Period Weeks   Status On-going            Plan - 05/16/16 1537    Clinical Impression Statement Assessed pt's progress towards goals in therapy today. She has made great progress with her swelling but still has considerable swelling in her right axilla and right lateral trunk. Continued with instruction of MLD today and had pt perform while educating pt on proper hand placement, pressure and sequence. Her left axillary swelling has also improved. Issued tricep strengthening exericse today. Added kinesiotape.    Rehab Potential Excellent   Clinical Impairments Affecting Rehab Potential none   PT Frequency 2x / week   PT Duration 4 weeks   PT Treatment/Interventions Patient/family education;Therapeutic exercise;Manual techniques;Manual lymph drainage;Scar mobilization;Passive range of motion;Taping;ADLs/Self Care Home Management   PT Next Visit Plan continue with MLD, begin strength after breast cancer program   PT Home Exercise Plan Post op shoulder ROM HEP, self MLD, supine scap series   Consulted and Agree with Plan of Care Patient      Patient will benefit from skilled therapeutic intervention in order to improve the following deficits and impairments:  Decreased knowledge of precautions, Pain, Impaired UE functional use, Decreased range of motion, Increased edema, Decreased scar mobility, Decreased strength  Visit Diagnosis: Localized edema  Muscle weakness (generalized)     Problem List Patient Active Problem List   Diagnosis Date Noted  . Breast cancer,  right ([place]) 04/12/2016  . Genetic testing 04/08/2016  . BRCA1 positive   . Family history of breast cancer   . Family history of ovarian cancer   . Family history of genetic disease carrier   . Breast cancer ([place])   . 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 breast in female, estrogen receptor positive ([place]) 03/15/2016  . Vitamin D deficiency 05/18/2015  . Hot flashes 05/18/2015  . Hypertension   . Asthma   . Allergy   .  Eczema     Allyson Sabal [facility] 05/16/2016, 3:40 PM  [place], [place], [address] Phone: [phone]   Fax:  [phone]  Name: Chelsea Salinas MRN: [id number] Date of Birth: [birth year]  Manus Gunning, PT 05/16/16 3:40 PM

## 2016-05-17 ENCOUNTER — Other Ambulatory Visit: Payer: Self-pay

## 2016-05-17 ENCOUNTER — Encounter (HOSPITAL_COMMUNITY): Payer: Self-pay | Admitting: *Deleted

## 2016-05-17 ENCOUNTER — Ambulatory Visit (INDEPENDENT_AMBULATORY_CARE_PROVIDER_SITE_OTHER): Payer: BLUE CROSS/BLUE SHIELD | Admitting: Physician Assistant

## 2016-05-17 ENCOUNTER — Encounter: Payer: Self-pay | Admitting: Physician Assistant

## 2016-05-17 VITALS — BP 140/82 | HR 68 | Temp 97.3°F | Resp 14 | Ht 64.5 in | Wt 154.8 lb

## 2016-05-17 DIAGNOSIS — Z79899 Other long term (current) drug therapy: Secondary | ICD-10-CM

## 2016-05-17 DIAGNOSIS — C50111 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 female breast: Secondary | ICD-10-CM

## 2016-05-17 DIAGNOSIS — Z170 Estrogen receptor positive status [ER+]: Secondary | ICD-10-CM

## 2016-05-17 DIAGNOSIS — E559 Vitamin D deficiency, unspecified: Secondary | ICD-10-CM

## 2016-05-17 DIAGNOSIS — I10 Essential (primary) hypertension: Secondary | ICD-10-CM

## 2016-05-17 MED ORDER — LOSARTAN POTASSIUM 100 MG PO TABS: 100.0000 mg | ORAL_TABLET | Freq: Every day | ORAL | 11 refills | Status: DC

## 2016-05-17 MED ORDER — VENLAFAXINE HCL ER 75 MG PO CP24: ORAL_CAPSULE | ORAL | 0 refills | Status: DC

## 2016-05-17 MED ORDER — VALACYCLOVIR HCL 500 MG PO TABS: 500.0000 mg | ORAL_TABLET | Freq: Two times a day (BID) | ORAL | 1 refills | Status: DC

## 2016-05-17 MED ORDER — PROCHLORPERAZINE MALEATE 10 MG PO TABS: 10.0000 mg | ORAL_TABLET | Freq: Four times a day (QID) | ORAL | 1 refills | Status: DC | PRN

## 2016-05-17 NOTE — Patient Instructions (Signed)
Monitor your blood pressure at home. Go to the ER if any CP, SOB, nausea, dizziness, severe HA, changes vision/speech  Goal BP:  For patients younger than 60: Goal BP < 140/90. For patients 60 and older: Goal BP < 150/90. For patients with diabetes: Goal BP < 140/90. Your most recent BP: BP: 140/82   Take your medications faithfully as instructed. Maintain a healthy weight. Get at least 150 minutes of aerobic exercise per week. Minimize salt intake. Minimize alcohol intake  DASH Eating Plan DASH stands for "Dietary Approaches to Stop Hypertension." The DASH eating plan is a healthy eating plan that has been shown to reduce high blood pressure (hypertension). Additional health benefits may include reducing the risk of type 2 diabetes mellitus, heart disease, and stroke. The DASH eating plan may also help with weight loss. WHAT DO I NEED TO KNOW ABOUT THE DASH EATING PLAN? For the DASH eating plan, you will follow these general guidelines:  Choose foods with a percent daily value for sodium of less than 5% (as listed on the food label).  Use salt-free seasonings or herbs instead of table salt or sea salt.  Check with your health care provider or pharmacist before using salt substitutes.  Eat lower-sodium products, often labeled as "lower sodium" or "no salt added."  Eat fresh foods.  Eat more vegetables, fruits, and low-fat dairy products.  Choose whole grains. Look for the word "whole" as the first word in the ingredient list.  Choose fish and skinless chicken or [place] more often than red meat. Limit fish, poultry, and meat to 6 oz (170 g) each day.  Limit sweets, desserts, sugars, and sugary drinks.  Choose heart-healthy fats.  Limit cheese to 1 oz (28 g) per day.  Eat more home-cooked food and less restaurant, buffet, and fast food.  Limit fried foods.  Cook foods using methods other than frying.  Limit canned vegetables. If you do use them, rinse them well to  decrease the sodium.  When eating at a restaurant, ask that your food be prepared with less salt, or no salt if possible. WHAT FOODS CAN I EAT? Seek help from a dietitian for individual calorie needs. Grains Whole grain or whole wheat bread. Brown rice. Whole grain or whole wheat pasta. Quinoa, bulgur, and whole grain cereals. Low-sodium cereals. Corn or whole wheat flour tortillas. Whole grain cornbread. Whole grain crackers. Low-sodium crackers. Vegetables Fresh or frozen vegetables (raw, steamed, roasted, or grilled). Low-sodium or reduced-sodium tomato and vegetable juices. Low-sodium or reduced-sodium tomato sauce and paste. Low-sodium or reduced-sodium canned vegetables.  Fruits All fresh, canned (in natural juice), or frozen fruits. Meat and Other Protein Products Ground beef (85% or leaner), grass-fed beef, or beef trimmed of fat. Skinless chicken or [place]. Ground chicken or [place]. Pork trimmed of fat. All fish and seafood. Eggs. Dried beans, peas, or lentils. Unsalted nuts and seeds. Unsalted canned beans. Dairy Low-fat dairy products, such as skim or 1% milk, 2% or reduced-fat cheeses, low-fat ricotta or cottage cheese, or plain low-fat yogurt. Low-sodium or reduced-sodium cheeses. Fats and Oils Tub margarines without trans fats. Light or reduced-fat mayonnaise and salad dressings (reduced sodium). Avocado. Safflower, olive, or canola oils. Natural peanut or almond butter. Other Unsalted popcorn and pretzels. The items listed above may not be a complete list of recommended foods or beverages. Contact your dietitian for more options. WHAT FOODS ARE NOT RECOMMENDED? Grains White bread. White pasta. White rice. Refined cornbread. Bagels and croissants. Crackers that contain trans  fat. Vegetables Creamed or fried vegetables. Vegetables in a cheese sauce. Regular canned vegetables. Regular canned tomato sauce and paste. Regular tomato and vegetable juices. Fruits Dried fruits. Canned  fruit in light or heavy syrup. Fruit juice. Meat and Other Protein Products Fatty cuts of meat. Ribs, chicken wings, bacon, sausage, bologna, salami, chitterlings, fatback, hot dogs, bratwurst, and packaged luncheon meats. Salted nuts and seeds. Canned beans with salt. Dairy Whole or 2% milk, cream, half-and-half, and cream cheese. Whole-fat or sweetened yogurt. Full-fat cheeses or blue cheese. Nondairy creamers and whipped toppings. Processed cheese, cheese spreads, or cheese curds. Condiments Onion and garlic salt, seasoned salt, table salt, and sea salt. Canned and packaged gravies. Worcestershire sauce. Tartar sauce. Barbecue sauce. Teriyaki sauce. Soy sauce, including reduced sodium. Steak sauce. Fish sauce. Oyster sauce. Cocktail sauce. Horseradish. Ketchup and mustard. Meat flavorings and tenderizers. Bouillon cubes. Hot sauce. Tabasco sauce. Marinades. Taco seasonings. Relishes. Fats and Oils Butter, stick margarine, lard, shortening, ghee, and bacon fat. Coconut, palm kernel, or palm oils. Regular salad dressings. Other Pickles and olives. Salted popcorn and pretzels. The items listed above may not be a complete list of foods and beverages to avoid. Contact your dietitian for more information. WHERE CAN I FIND MORE INFORMATION? National Heart, Lung, and Blood Institute: travelstabloid.com Document Released: 01/13/2011 Document Revised: 06/10/2013 Document Reviewed: 11/28/2012 [facility] Patient Information 2015 [place], [place]. This information is not intended to replace advice given to you by your health care provider. Make sure you discuss any questions you have with your health care provider.

## 2016-05-17 NOTE — Progress Notes (Signed)
Assessment and Plan:   Essential hypertension Monitor at home  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 breast in female, estrogen receptor positive ([place]) Continue follow up, if any issues during chemo can contact our office as well follow up 6 monhs  Vitamin D deficiency Continue vitamin D  Medication management   Continue diet and meds as discussed. Further disposition pending results of labs. Over 30 minutes of exam, counseling, chart review, and critical decision making was performed  Future Appointments Date Time Provider [place]  05/18/2016 1:00 PM Jomarie Longs, PT [facility] None  05/20/2016 10:00 AM [facility] LAB 4 [facility] None  05/20/2016 10:15 AM [facility] J32 DNS [facility] None  05/20/2016 10:45 AM Truitt Merle, MD [facility] None  05/20/2016 12:00 PM [facility] C8 [facility] None  05/21/2016 10:30 AM [facility] INJ NURSE [facility] None  05/23/2016 1:00 PM Blaire L Breedlove Blue, PT [facility] None  05/25/2016 1:00 PM Suanne Marker, PTA [facility] None  05/30/2016 1:00 PM Blaire L Breedlove Blue, PT [facility] None  06/01/2016 1:00 PM Suanne Marker, PTA [facility] None  06/02/2016 9:00 AM [facility] J32 DNS [facility] None  06/02/2016 9:30 AM [facility] C8 [facility] None  06/04/2016 10:30 AM [facility] INJ NURSE [facility] None  06/06/2016 1:00 PM Blaire L Breedlove Blue, PT [facility] None  06/08/2016 1:00 PM Suanne Marker, PTA [facility] None  06/16/2016 9:45 AM [facility] F20 [facility] None  06/18/2016 10:30 AM [facility] INJ NURSE [facility] None  11/22/2016 9:00 AM [name], PA-C [facility] None  05/12/2017 3:45 PM Brook Oletta Lamas, MD [place] None     HPI 61 y.o. female  presents for 3 month follow up on hypertension, cholesterol, prediabetes, and vitamin D deficiency.   Her blood pressure has been controlled at home, today their BP is BP: 140/82  She does workout, yoga and walks outside with her dogs/horses, has  started again. She denies chest pain, shortness of breath, dizziness. She was diagnosed with right breast cancer ER +, PR+ HER2 - in Jan of this year, following with Dr. Burr Medico, had double mastectomy with [name] 04/12/2016, doing PT several times a week and walking with her . Will start adjuvant chemo with cytoxan and docetaxel for 4 weeks and taxol for 12 weeks, getting port placed, and going to start this month, going to do it for 6 months. Patient is BRCA 1 + and will have BSO.   She is not on cholesterol medication and denies myalgias. Her cholesterol is at goal. The cholesterol last visit was:   Lab Results  Component Value Date   CHOL 218 (H) 11/16/2015   HDL 115 11/16/2015   LDLCALC 70 11/16/2015   TRIG 166 (H) 11/16/2015   CHOLHDL 1.9 11/16/2015    She has been working on diet and exercise for prediabetes, and denies paresthesia of the feet, polydipsia, polyuria and visual disturbances. Last A1C in the office was:  Lab Results  Component Value Date   HGBA1C 5.5 05/26/2014   Patient is on Vitamin D supplement.   Lab Results  Component Value Date   VD25OH 51 11/16/2015     BMI is Body mass index is 26.16 kg/m., she is working on diet and exercise. Wt Readings from Last 3 Encounters:  05/17/16 154 lb 12.8 oz (70.2 kg)  05/05/16 151 lb 14.4 oz (68.9 kg)  04/29/16 153 lb (69.4 kg)    Current Medications:  Current Outpatient Prescriptions on File Prior to Visit  Medication Sig Dispense Refill  . acetaminophen (  TYLENOL) 500 MG tablet Take 500-1,000 mg by mouth every 6 (six) hours as needed for mild pain or headache (depends on pain if takes 1-2 tablets).    Marland Kitchen aspirin EC 81 MG tablet Take 81 mg by mouth daily.    . cetirizine (ZYRTEC) 10 MG tablet Take 10 mg by mouth at bedtime.     . Cholecalciferol (VITAMIN D) 2000 units tablet Take 2,000 Units by mouth daily.    . cloNIDine (CATAPRES) 0.2 MG tablet Take 1 tablet by mouth at bedtime.   0  . escitalopram (LEXAPRO) 10 MG  tablet Take 1 tablet by mouth daily at 12 noon.   0  . Flaxseed, Linseed, (FLAXSEED OIL PO) Take 1,400 mg by mouth every evening.    . fluticasone (FLONASE ALLERGY RELIEF) 50 MCG/ACT nasal spray Place 1 spray into both nostrils daily.    Marland Kitchen lidocaine-prilocaine (EMLA) cream Apply 1 application topically as needed. 30 g 3  . losartan (COZAAR) 100 MG tablet Take 1 tablet (100 mg total) by mouth daily. (Patient taking differently: Take 100 mg by mouth every evening. ) 30 tablet 11  . magnesium oxide (MAG-OX) 400 MG tablet Take 400 mg by mouth every other day.    . montelukast (SINGULAIR) 10 MG tablet TAKE 1 TABLET(10 MG) BY MOUTH DAILY (Patient taking differently: TAKE 1 TABLET BY MOUTH AS NEEDED FOR SHORTNESS OF BREATH) 90 tablet 0  . Multiple Vitamins-Minerals (MULTIVITAMIN WITH MINERALS) tablet Take 1 tablet by mouth every evening.     . Omega-3 Fatty Acids ([employer] FISH OIL) 1200 MG CAPS Take 1,200 mg by mouth every evening.     . ondansetron (ZOFRAN) 8 MG tablet Take 1 tablet (8 mg total) by mouth 2 (two) times daily as needed. Start on the third day after chemotherapy. 30 tablet 1  . prochlorperazine (COMPAZINE) 10 MG tablet Take 1 tablet (10 mg total) by mouth every 6 (six) hours as needed (Nausea or vomiting). 30 tablet 1  . valACYclovir (VALTREX) 500 MG tablet Take 1 tablet (500 mg total) by mouth 2 (two) times daily. Take for 3 days as needed. (Patient taking differently: Take 500 mg by mouth 2 (two) times daily as needed (outbreaks). Take for 3 days as needed.) 30 tablet 1  . venlafaxine XR (EFFEXOR-XR) 75 MG 24 hr capsule TAKE 2 CAPSULES BY MOUTH EVERY MORNING AND 1 CAPSULE EVERY EVENING 270 capsule 0  . vitamin B-12 (CYANOCOBALAMIN) 100 MCG tablet Take 100 mcg by mouth every other day.      No current facility-administered medications on file prior to visit.    Medical History:  Past Medical History:  Diagnosis Date  . Abnormal Pap smear of cervix    --in her 20s had colpo and some type of  treatment to cervix.  . Allergy   . Asthma    "all the time, throughout the yr"  . 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 Breast cancer, right breast ([place]) 02/2016  . Eczema   . Family history of breast cancer   . Family history of genetic disease carrier    paternal relatives with a BRCA mutation  . Family history of ovarian cancer   . Hypertension   . Pneumonia    2000ish  . STD (sexually transmitted disease)    HSV II   Allergies: No Known Allergies   Review of Systems:  Review of Systems  Constitutional: Negative.   HENT: Negative for congestion, ear discharge,  ear pain, hearing loss, nosebleeds, sore throat and tinnitus.   Eyes: Negative.   Respiratory: Negative.  Negative for stridor.   Cardiovascular: Negative.   Gastrointestinal: Negative.   Genitourinary: Negative.   Musculoskeletal: Negative.   Skin: Negative.   Neurological: Negative.  Negative for headaches.  Endo/Heme/Allergies: Negative.   Psychiatric/Behavioral: Negative.     Family history- Review and unchanged Social history- Review and unchanged Physical Exam: BP 140/82   Pulse 68   Temp 97.3 F (36.3 C)   Resp 14   Ht 5' 4.5" (1.638 m)   Wt 154 lb 12.8 oz (70.2 kg)   SpO2 95%   BMI 26.16 kg/m  Wt Readings from Last 3 Encounters:  05/17/16 154 lb 12.8 oz (70.2 kg)  05/05/16 151 lb 14.4 oz (68.9 kg)  04/29/16 153 lb (69.4 kg)   General Appearance: Well nourished, in no apparent distress. Eyes: PERRLA, EOMs, conjunctiva no swelling or erythema Sinuses: No Frontal/maxillary tenderness ENT/Mouth: Ext aud canals clear, TMs without erythema, bulging. No erythema, swelling, or exudate on post pharynx.  Tonsils not swollen or erythematous. Hearing normal.  Neck: Supple, thyroid normal.  Respiratory: Respiratory effort normal, BS equal bilaterally without rales, rhonchi, wheezing or stridor.  Cardio: RRR with no MRGs. Brisk peripheral pulses without edema.  Abdomen:  Soft, + BS,  Non tender, no guarding, rebound, hernias, masses. Lymphatics: Non tender without lymphadenopathy.  Musculoskeletal: Full ROM, 5/5 strength, Normal gait Skin: Warm, dry without rashes, lesions, ecchymosis.  Neuro: Cranial nerves intact. Normal muscle tone, no cerebellar symptoms. Psych: Awake and oriented X 3, normal affect, Insight and Judgment appropriate.    [name], PA-C 2:50 PM [facility]

## 2016-05-18 ENCOUNTER — Ambulatory Visit: Payer: BLUE CROSS/BLUE SHIELD | Admitting: Physical Therapy

## 2016-05-18 DIAGNOSIS — R600 Localized edema: Secondary | ICD-10-CM

## 2016-05-18 DIAGNOSIS — M25612 Stiffness of left shoulder, not elsewhere classified: Secondary | ICD-10-CM

## 2016-05-18 DIAGNOSIS — M6281 Muscle weakness (generalized): Secondary | ICD-10-CM

## 2016-05-18 DIAGNOSIS — Z170 Estrogen receptor positive status [ER+]: Secondary | ICD-10-CM | POA: Diagnosis not present

## 2016-05-18 DIAGNOSIS — M25611 Stiffness of right shoulder, not elsewhere classified: Secondary | ICD-10-CM

## 2016-05-18 DIAGNOSIS — C50111 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 female breast: Secondary | ICD-10-CM | POA: Diagnosis not present

## 2016-05-18 MED ORDER — CEFAZOLIN SODIUM-DEXTROSE 2-4 GM/100ML-% IV SOLN
2.0000 g | INTRAVENOUS | Status: AC
  Administered 2016-05-19: 2 g via INTRAVENOUS
  Filled 2016-05-18: qty 100

## 2016-05-18 NOTE — Progress Notes (Signed)
[place]  [phone] [phone]) [phone]  Clinic Follow Up Note   Patient Care Team: [name], MD as PCP - General (Internal Medicine) Renella Cunas, MD (Inactive) as Consulting Physician (Cardiology) [name], MD as Consulting Physician (Dermatology) 05/20/2016    CHIEF COMPLAINTS:  Follow up right breast cancer  Oncology History   Cancer Staging 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 breast in female, estrogen receptor positive ([place]) Staging form: Breast, AJCC 8th Edition - Clinical stage from 03/08/2016: Stage IA (cT1c, cN0, cM0, G2, ER: Positive, PR: Negative, HER2: Negative) - Signed by Truitt Merle, MD on 03/15/2016 - Pathologic stage from 04/12/2016: Stage IA (pT1c, pN0, cM0, G2, ER: Positive, PR: Negative, HER2: Negative, Oncotype DX score: 49) - Signed by Truitt Merle, MD on 05/05/2016       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 breast in female, estrogen receptor positive ([place])   03/04/2016 Mammogram    Diagnostic bilateral mammogram and right breast ultrasound showed a 1.7 cm mass in the 12:00 position of the right breast, highly suspicious for malignancy. There is a borderline abnormal right axillary lymph node, also suspicious for metastasis.       03/08/2016 Initial Diagnosis    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 breast in female, estrogen receptor positive ([place])      03/08/2016 Initial Biopsy    Right breast 12:00 core needle biopsy showed invasive ductal carcinoma, grade 2, right axillary lymph node biopsy was negative.      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  Genes Analyzed: 43 genes on [facility]'s Common Cancers panel (APC, ATM, AXIN2, BARD1, BMPR1A, BRCA1, BRCA2, BRIP1, CDH1, CDKN2A, CHEK2, DICER1, EPCAM, GREM1, HOXB13, KIT, MEN1, MLH1, MSH2, MSH6, MUTYH,  NBN, NF1, PALB2, PDGFRA, PMS2, POLD1, POLE, PTEN, RAD50, RAD51C, RAD51D, SDHA, SDHB, SDHC, SDHD, SMAD4, SMARCA4, STK11, TP53, TSC1, TSC2, VHL).       04/12/2016 Surgery    Bilateral mastectomy and right sentinel lymph node biopsy, by [name]      04/12/2016 Pathology Results    Right breast mastectomy showed invasive grade 2 invasive ductal carcinoma, 1.7 cm, margins were negative, 3 sentinel lymph nodes were negative, left simple mastectomy fibroadenoma, one benign node, no atypia or tumor.       05/04/2016 Oncotype testing    Recurrence score 49, high-risk, predicts 10 year risk of distant recurrence 32% with tamoxifen alone.      05/10/2016 Echocardiogram    Echo on 05/10/16 showed EF 55-60%.      05/20/2016 -  Chemotherapy    dose dense Adriamycin and Cytoxan, every 2 weeks, for 4 cycles, followed by Taxol  (weekly for 12 weeks or biweekly for 4 cycles) starting 05/20/16       HISTORY OF PRESENTING ILLNESS:  Chelsea Salinas 61 y.o. female is here because of recently diagnosed right breast invasive mammary carcinoma. She is accompanied by her husband to our multidisciplinary breast clinic today.  The patient underwent routine screening mammogram on 02/29/2016 and was found to have possible architectural distortion and asymmetry in the right breast. Also found was asymmetry in the left breast.  Accordingly a bilateral diagnostic mammogram and unilateral right breast ultrasound were performed on 03/04/2016. These scans showed a 1.7 cm mass in the 12 o'clock position of the right breast with imaging  features highly suspicious for malignancy. Also seen was borderline abnormal right axillary lymph node, suspicious for a metastatic node.  Biopsy of the right breast 12:00 o'clock position on 03/08/2016 revealed invasive mammary carcinoma. The carcinoma appears to be at least grade 2, ER positive, PR negative, HER-2 negative, Ki67 20%. The malignant cells are positive for E-Cadherin, supporting a  ductal phenotype. Biopsy of the right axillary lymph node showed no evidence of carcinoma.   The patient reports to the clinic today for follow up and to discuss chemotherapy. She has had surgery about 3 weeks ago and she has been doing well. She started antibiotics yesterday to help with infection that she has recently gotten. She has also started doing PT this week. She decided not to do reconstruction. She has been eating well.    GYN HISTORY  Menarchal:12 LMP: late 74's (hot flushes since mid 43's)  Contraceptive: 23 HRT: 3-4 years  G0P0:  CURRENT THERAPY: dose dense Adriamycin and Cytoxan, every 2 weeks, for 4 cycles, followed by Taxol  (weekly for 12 weeks or biweekly for 4 cycles) starting 05/20/16  INTERVAL HISTORY: Chelsea Salinas returns for follow up and chemo. She presents with her husband. The patient states her right chest cellulitis has improved. [name] was able to look at it yesterday when he placed the port-a-cath. She has her nausea medications and EMLA cream. The patient denies issues with her port. The patient states she wants Onpro. She reports bad hot flashes for the past 10 years. She is taking lexapro for this. She states she is trying to remain physically active.  MEDICAL HISTORY:  Past Medical History:  Diagnosis Date  . Abnormal Pap smear of cervix    --in her 20s had colpo and some type of treatment to cervix.  . Allergy   . Asthma    "all the time, throughout the yr"  . 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 Breast cancer, right breast ([place]) 02/2016  . Eczema   . Family history of breast cancer   . Family history of genetic disease carrier    paternal relatives with a BRCA mutation  . Family history of ovarian cancer   . Hypertension   . Pneumonia    2000ish  . STD (sexually transmitted disease)    HSV II    SURGICAL HISTORY: Past Surgical History:  Procedure Laterality Date  . BREAST BIOPSY Right 04/13/2016    Procedure: Evacuation RIGHT Breast  Hematoma;  Surgeon: [name], MD;  Location: [facility];  Service: General;  Laterality: Right;  . COLONOSCOPY    . DILATION AND CURETTAGE OF UTERUS  2008  . MASTECTOMY Left 04/12/2016   prophylactic total mastectomy  . MASTECTOMY COMPLETE / SIMPLE W/ SENTINEL NODE BIOPSY Right 04/12/2016   axillary sentinel LNB  . MASTECTOMY W/ SENTINEL NODE BIOPSY Bilateral 04/12/2016   Procedure: BILATERAL TOTAL MASTECTOMIES  WITH RIGHT SENTINEL LYMPH NODE BIOPSY;  Surgeon: [name], MD;  Location: [facility];  Service: General;  Laterality: Bilateral;  . PORTACATH PLACEMENT Right 05/19/2016   Procedure: INSERTION PORT-A-CATH WITH [place];  Surgeon: [name], MD;  Location: [facility];  Service: General;  Laterality: Right;    SOCIAL HISTORY: Social History   Social History  . Marital status: Married    Spouse name: N/A  . Number of children: N/A  . Years of education: N/A   Occupational History  . Not on file.   Social History Main Topics  .  Smoking status: Former Smoker    Packs/day: 1.00    Years: 25.00    Quit date: 02/08/1996  . Smokeless tobacco: Never Used  . Alcohol use 13.2 oz/week    15 Standard drinks or equivalent, 7 Glasses of wine per week  . Drug use: No  . Sexual activity: No   Other Topics Concern  . Not on file   Social History Narrative  . No narrative on file    FAMILY HISTORY: Family History  Problem Relation Age of Onset  . Polymyalgia rheumatica Mother   . Hypertension Father   . Heart disease Father     Dec 69  . Heart attack Father   . Prostate cancer Father     Dx 51s; deceased 34  . Breast cancer Cousin     Dx 54s; daughter of a paternal uncle  . Ovarian cancer Paternal Aunt     Dx 44; deceased  . Ovarian cancer Cousin     Dx 6s; daughter of a paternal uncle  . Ovarian cancer Cousin     Dx 55; daughter of a paternal uncle  . Ovarian cancer Paternal Aunt     Dx 105s.  BRCA positive  . Ovarian cancer Other      pat grandfather's mother    ALLERGIES:  is allergic to no known allergies.  MEDICATIONS:  Current Outpatient Prescriptions  Medication Sig Dispense Refill  . acetaminophen (TYLENOL) 500 MG tablet Take 500-1,000 mg by mouth every 6 (six) hours as needed for mild pain or headache (depends on pain if takes 1-2 tablets).    Marland Kitchen aspirin EC 81 MG tablet Take 81 mg by mouth daily.    . cetirizine (ZYRTEC) 10 MG tablet Take 10 mg by mouth at bedtime.     . Cholecalciferol (VITAMIN D) 2000 units tablet Take 2,000 Units by mouth daily.    . cloNIDine (CATAPRES) 0.2 MG tablet Take 1 tablet by mouth at bedtime.   0  . escitalopram (LEXAPRO) 10 MG tablet Take 1 tablet by mouth daily at 12 noon.   0  . Flaxseed, Linseed, (FLAXSEED OIL PO) Take 1,400 mg by mouth every evening.    . fluticasone (FLONASE ALLERGY RELIEF) 50 MCG/ACT nasal spray Place 1 spray into both nostrils daily.    Marland Kitchen lidocaine-prilocaine (EMLA) cream Apply 1 application topically as needed. 30 g 3  . losartan (COZAAR) 100 MG tablet Take 1 tablet (100 mg total) by mouth daily. 30 tablet 11  . magnesium oxide (MAG-OX) 400 MG tablet Take 400 mg by mouth every other day.    . montelukast (SINGULAIR) 10 MG tablet TAKE 1 TABLET(10 MG) BY MOUTH DAILY (Patient taking differently: TAKE 1 TABLET BY MOUTH AS NEEDED FOR SHORTNESS OF BREATH) 90 tablet 0  . Multiple Vitamins-Minerals (MULTIVITAMIN WITH MINERALS) tablet Take 1 tablet by mouth every evening.     . Omega-3 Fatty Acids ([employer] FISH OIL) 1200 MG CAPS Take 1,200 mg by mouth every evening.     . valACYclovir (VALTREX) 500 MG tablet Take 1 tablet (500 mg total) by mouth 2 (two) times daily. Take for 3 days as needed. 30 tablet 1  . venlafaxine XR (EFFEXOR-XR) 75 MG 24 hr capsule TAKE 2 CAPSULES BY MOUTH EVERY MORNING AND 1 CAPSULE EVERY EVENING 270 capsule 0  . vitamin B-12 (CYANOCOBALAMIN) 100 MCG tablet Take 100 mcg by mouth every other day.     . ondansetron (ZOFRAN) 8 MG tablet Take 1  tablet (8 mg total)  by mouth 2 (two) times daily as needed. Start on the third day after chemotherapy. (Patient not taking: Reported on 05/20/2016) 30 tablet 1  . prochlorperazine (COMPAZINE) 10 MG tablet Take 1 tablet (10 mg total) by mouth every 6 (six) hours as needed (Nausea or vomiting). (Patient not taking: Reported on 05/20/2016) 30 tablet 1   No current facility-administered medications for this visit.     REVIEW OF SYSTEMS:   Constitutional: Denies fevers, chills or abnormal night sweats Eyes: Denies blurriness of vision, double vision or watery eyes Ears, nose, mouth, throat, and face: Denies mucositis or sore throat Respiratory: Denies cough, dyspnea or wheezes Cardiovascular: Denies palpitation, chest discomfort or lower extremity swelling Gastrointestinal:  Denies nausea, heartburn or change in bowel habits Skin: No skin rashes or lesions. Lymphatics: Denies new lymphadenopathy or easy bruising Neurological:Denies numbness, tingling or new weaknesses Behavioral/Psych: Mood is stable, no new changes  All other systems were reviewed with the patient and are negative.  PHYSICAL EXAMINATION:  ECOG PERFORMANCE STATUS: 0 - Asymptomatic  Vitals:   05/20/16 1132  BP: 139/82  Pulse: 79  Resp: 18  Temp: 98.5 F (36.9 C)   Filed Weights   05/20/16 1132  Weight: 153 lb 3.2 oz (69.5 kg)    GENERAL:alert, no distress and comfortable SKIN: skin color, texture, turgor are normal, no rashes or significant lesions EYES: normal, conjunctiva are pink and non-injected, sclera clear OROPHARYNX:no exudate, no erythema and lips, buccal mucosa, and tongue normal  NECK: supple, thyroid normal size, non-tender, without nodularity LYMPH:  no palpable lymphadenopathy in the cervical, axillary or inguinal LUNGS: clear to auscultation and percussion with normal breathing effort HEART: regular rate & rhythm and no murmurs and no lower extremity edema ABDOMEN:abdomen soft, non-tender and normal  bowel sounds Musculoskeletal:no cyanosis of digits and no clubbing  PSYCH: alert & oriented x 3 with fluent speech NEURO: no focal motor/sensory deficits Breasts: Breast inspection showed Status post bilateral mastectomy, surgical incisions have healed well with no discharge or surrounding skin Erythema. She has residual mild skin erythema on the right side chest wall, and mild soft tissue fullness at his right axilla secondary to surgery.  LABORATORY DATA:  I have reviewed the data as listed CBC Latest Ref Rng & Units 05/20/2016 05/19/2016 04/13/2016  WBC 3.9 - 10.3 10e3/uL 4.4 5.1 8.8  Hemoglobin 11.6 - 15.9 g/dL 11.8 11.5(L) 9.0(L)  Hematocrit 34.8 - 46.6 % 35.1 34.9(L) 27.1(L)  Platelets 145 - 400 10e3/uL 279 280 197    CMP Latest Ref Rng & Units 05/20/2016 05/19/2016 04/08/2016  Glucose 70 - 140 mg/dl 105 93 90  BUN 7.0 - 26.0 mg/dL 12.4 11 13   Creatinine 0.6 - 1.1 mg/dL 0.7 0.63 0.64  Sodium 136 - 145 mEq/L 143 139 138  Potassium 3.5 - 5.1 mEq/L 4.1 3.9 4.2  Chloride 101 - 111 mmol/L - 101 103  CO2 22 - 29 mEq/L 28 23 27   Calcium 8.4 - 10.4 mg/dL 9.3 9.6 9.5  Total Protein 6.4 - 8.3 g/dL 6.5 - -  Total Bilirubin 0.20 - 1.20 mg/dL 0.25 - -  Alkaline Phos 40 - 150 U/L 91 - -  AST 5 - 34 U/L 17 - -  ALT 0 - 55 U/L 13 - -    PATHOLOGY  Diagnosis 03/08/2016 1. Breast, right, needle core biopsy, 12:00 o'clock - INVASIVE MAMMARY CARCINOMA. - SEE COMMENT. 2. Lymph node, needle/core biopsy, right axilla - THERE IS NO EVIDENCE OF CARCINOMA IN 1 OF 1 LYMPH  NODE (0/1). Microscopic Comment 1. The carcinoma appears at least grade 2. An E-cadherin stain will be performed and the results reported separately. A breast prognostic profile will be performed and the results reported separately. The results were called to [facility] on 03/09/2016. PROGNOSTIC INDICATORS Estrogen Receptor: 20%, POSITIVE, WEAK STAINING INTENSITY Progesterone Receptor: 0%, NEGATIVE Proliferation  Marker Ki67: 20% HER2 - NEGATIVE 1. The malignant cells are positive for E-Cadherin, supporting a ductal phenotype. (JBK:kh 03/09/16)  RADIOGRAPHIC STUDIES: I have personally reviewed the radiological images as listed and agreed with the findings in the report. Dg Chest Port 1 View  Result Date: 05/19/2016 CLINICAL DATA:  Port-A-Cath placement EXAM: PORTABLE CHEST 1 VIEW COMPARISON:  03/11/2008 FINDINGS: Right internal jugular power port tip in the SVC just below the azygos level. No pneumothorax. Lungs remain clear. Heart size is normal. IMPRESSION: Right internal jugular power port placement. Tip in the SVC just below the azygos level, 5 cm above the right atrium. No complication. Electronically Signed   By: Nelson Chimes M.D.   On: 05/19/2016 09:20   Dg Fluoro Guide Cv Line-no Report  Result Date: 05/19/2016 Fluoroscopy was utilized by the requesting physician.  No radiographic interpretation.     ECHO 05/10/16 Study Conclusions - Left ventricle: The cavity size was normal. Systolic function was   normal. The estimated ejection fraction was in the range of 55%   to 60%. Wall motion was normal; there were no regional wall   motion abnormalities. Doppler parameters are consistent with   abnormal left ventricular relaxation (grade 1 diastolic   dysfunction). There was no evidence of elevated ventricular   filling pressure by Doppler parameters. - Aortic valve: Trileaflet; normal thickness leaflets. There was no   regurgitation. - Aortic root: The aortic root was normal in size. - Mitral valve: Structurally normal valve. There was no   regurgitation. - Right ventricle: The cavity size was normal. Wall thickness was   normal. Systolic function was normal. - Tricuspid valve: There was trivial regurgitation. - Pulmonary arteries: Systolic pressure was within the normal   range. - Inferior vena cava: The vessel was normal in size. - Pericardium, extracardiac: There was no pericardial  effusion.   ASSESSMENT & PLAN:  61 y.o. postmenopausal woman, presented with screening discovered right breast cancer  1. 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 Oncotype RS 49, high risk  - Patient has had bilateral total mastectomy on 04/12/16 and denied reconstruction.  -I reviewed her surgical pathology findings with patient and her husband in details. Her surgical margins were negative, 3 sentinel lymph nodes were also negative. - Her oncotype is back at 49. This is fairly high risk disease based on the RS. the estimated 10 year risk of distant recurrence with tamoxifen alone is 32%. I strongly recommend chemotherapy to reduce her risk of cancer recurrence after surgery (by ~50%). - Baseline Echo on 05/10/16 showed EF 55-60%. -lab reviewed, adequate for treatment. She will start first cycle adjuvant chemotherapy with Adriamycin and Cytoxan today. -The patient decided she would like Onpro -We again reviewed man side effects from chemotherapy and management strategy, especially neutropenia fever, hydration, nausea management, etc. She voiced good understanding. -Compazine and Zofran has been called into her pharmacy.  2. BRCA1 mutation (+) -Due to her strong family history of ovarian cancer and also positive family history of breast cancer, she underwent genetic testing -  Patient is positive for BRCA1 Mutation -We discussed the high risk of breast cancer and colon cancer due to the BRCA1 mutation. She is status post bilateral mastectomy. She is agreeable to have BSO, she has discussed with her gynecologist. I recommend her to have this surgery done after she completes adjuvant chemotherapy. - Her sister has done genetic testing, results pending -She has no children.  3. Right chest wall cellulitis -Resolved now  -she had port placed yesterday   Plan:  - 1st chemo treatment AC  today. -The patent opted for Onpro support. Cancel Neulasta injection on 4/14 and 4/28. -Lab and f/u with APP in 1 week for toxicity check up -Lab, flushed, follow-up and the second cycle before meals in 2 weeks  Orders Placed This Encounter  Procedures  . CBC with Differential    Standing Status:   Standing    Number of Occurrences:   50    Standing Expiration Date:   05/20/2021  . Comprehensive metabolic panel    Standing Status:   Standing    Number of Occurrences:   50    Standing Expiration Date:   05/20/2021    All questions were answered. The patient knows to call the clinic with any problems, questions or concerns. I spent 25 minutes counseling the patient face to face. The total time spent in the appointment was 30 minutes and more than 50% was on counseling.   Truitt Merle, MD 05/20/2016   This document serves as a record of services personally performed by Truitt Merle, MD. It was created on her behalf by Darcus Austin, a trained medical scribe. The creation of this record is based on the scribe's personal observations and the provider's statements to them. This document has been checked and approved by the attending provider.

## 2016-05-18 NOTE — Therapy (Signed)
[place], [place], [address] Phone: [phone]   Fax:  [phone]  Physical Therapy Treatment  Patient Details  Name: Chelsea Salinas MRN: [id number] Date of Birth: 02-Oct-1955 Referring Provider: [name]  Encounter Date: 05/18/2016      PT End of Session - 05/18/16 1550    Visit Number 5   Number of Visits 9   Date for PT Re-Evaluation 06/01/16   PT Start Time 1324   PT Stop Time 1345   PT Time Calculation (min) 42 min   Activity Tolerance Patient tolerated treatment well   Behavior During Therapy [facility] for tasks assessed/performed      Past Medical History:  Diagnosis Date  . Abnormal Pap smear of cervix    --in her 20s had colpo and some type of treatment to cervix.  . Allergy   . Asthma    "all the time, throughout the yr"  . 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 Breast cancer, right breast ([place]) 02/2016  . Eczema   . Family history of breast cancer   . Family history of genetic disease carrier    paternal relatives with a BRCA mutation  . Family history of ovarian cancer   . Hypertension   . Pneumonia    2000ish  . STD (sexually transmitted disease)    HSV II    Past Surgical History:  Procedure Laterality Date  . BREAST BIOPSY Right 04/13/2016   Procedure: Evacuation RIGHT Breast  Hematoma;  Surgeon: [name], MD;  Location: [facility];  Service: General;  Laterality: Right;  . COLONOSCOPY    . DILATION AND CURETTAGE OF UTERUS  2008  . MASTECTOMY Left 04/12/2016   prophylactic total mastectomy  . MASTECTOMY COMPLETE / SIMPLE W/ SENTINEL NODE BIOPSY Right 04/12/2016   axillary sentinel LNB  . MASTECTOMY W/ SENTINEL NODE BIOPSY Bilateral 04/12/2016   Procedure: BILATERAL TOTAL MASTECTOMIES  WITH RIGHT SENTINEL LYMPH NODE BIOPSY;  Surgeon: [name], MD;  Location: [facility];  Service: General;  Laterality: Bilateral;    There were no  vitals filed for this visit.      Subjective Assessment - 05/18/16 1305    Subjective Feels she can do the manual lymph drainage, and has been trying it when she's sitting watching TV, etc.  "I get my Port tomorrow morning at 5:30 a.m. and start chemo on Friday."   Currently in Pain? No/denies                         [facility] Adult PT Treatment/Exercise - 05/18/16 0001      Exercises   Exercises Other Exercises   Other Exercises  Patient was educated about the strength ABC program and its purpose, then instructed in the process.  She performed all of the stretches x 15 seconds each bilat., then each core exercise 10reps, then each of the resistance exercises holding a 1 lb. weight in each hand x 10 reps.                  PT Education - 05/18/16 1550    Education provided Yes   Education Details strength ABC program   Person(s) Educated Patient   Methods Explanation;Demonstration;Tactile cues;Verbal cues;Handout   Comprehension Verbalized understanding;Returned demonstration              Breast Clinic Goals - 03/16/16 1705      Patient  will be able to verbalize understanding of pertinent lymphedema risk reduction practices relevant to her diagnosis specifically related to skin care.   Time 1   Period Days   Status Achieved     Patient will be able to return demonstrate and/or verbalize understanding of the post-op home exercise program related to regaining shoulder range of motion.   Time 1   Period Days   Status Achieved     Patient will be able to verbalize understanding of the importance of attending the postoperative After Breast Cancer Class for further lymphedema risk reduction education and therapeutic exercise.   Time 1   Period Days   Status Achieved          Long Term Clinic Goals - 05/16/16 1302      CC Long Term Goal  #1   Title Pt to independently verbalize lymphedema risk reduction practices   Baseline 05/16/16- reviewed this  with pt again today   Time 4   Period Weeks   Status On-going     CC Long Term Goal  #2   Title Pt to demonstrate 4/5 right tricep strength to allow her to return to prior level of function   Baseline 3+/5   Time 4   Period Weeks   Status On-going     CC Long Term Goal  #3   Title Pt to be independent in a home exercise program for continued strengthening and stretching   Time 4   Period Weeks   Status On-going     CC Long Term Goal  #4   Title Pt to report at least a 70 percent improvement in right axillary swelling to improve comfort   Baseline 05/16/16- 75% improvement   Time 4   Period Weeks   Status Achieved     CC Long Term Goal  #5   Title Pt will demonstrate 165 degrees of right shoulder flexion to allow her to reach items overhead   Baseline 151, 05/16/16- 158 degrees   Time 4   Period Weeks   Status On-going            Plan - 05/18/16 1551    Clinical Impression Statement Pt. reported having no questions about doing self-MLD.  She was instructed in strength ABC program and was a quick learner of the whole program.  She was given the detailed handout and several logs to use at home; she was encouraged to keep the weights low for this initially. She still c/o some swelling along her flank. Kinesiotape from last session was still in place.   Rehab Potential Excellent   Clinical Impairments Affecting Rehab Potential none   PT Frequency 2x / week   PT Duration 4 weeks   PT Treatment/Interventions Patient/family education;Therapeutic exercise;Manual techniques;Manual lymph drainage;Scar mobilization;Passive range of motion;Taping;ADLs/Self Care Home Management   PT Next Visit Plan continue with MLD, begin strength after breast cancer program; possible discharge soon?   PT Home Exercise Plan Post op shoulder ROM HEP, self MLD, supine scap series, strength ABC program   Consulted and Agree with Plan of Care Patient      Patient will benefit from skilled therapeutic  intervention in order to improve the following deficits and impairments:  Decreased knowledge of precautions, Pain, Impaired UE functional use, Decreased range of motion, Increased edema, Decreased scar mobility, Decreased strength  Visit Diagnosis: Localized edema  Muscle weakness (generalized)  Stiffness of right shoulder, not elsewhere classified  Stiffness of left shoulder, not elsewhere  classified     Problem List Patient Active Problem List   Diagnosis Date Noted  . Breast cancer, right ([place]) 04/12/2016  . Genetic testing 04/08/2016  . BRCA1 positive   . Family history of breast cancer   . Family history of ovarian cancer   . Family history of genetic disease carrier   . Breast cancer ([place])   . 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 breast in female, estrogen receptor positive ([place]) 03/15/2016  . Vitamin D deficiency 05/18/2015  . Hot flashes 05/18/2015  . Hypertension   . Asthma   . Allergy   . Eczema     Chelsea Salinas 05/18/2016, 3:54 PM  [facility] [place], [place], [address] Phone: [phone]   Fax:  [phone]  Name: Chelsea Salinas MRN: [id number] Date of Birth: 09-27-1955  Serafina Royals, PT 05/18/16 3:54 PM

## 2016-05-18 NOTE — Anesthesia Preprocedure Evaluation (Signed)
Anesthesia Evaluation  Patient identified by MRN, date of birth, ID band Patient awake    Reviewed: Allergy & Precautions, H&P , Patient's Chart, lab work & pertinent test results, reviewed documented beta blocker date and time   Airway Mallampati: II  TM Distance: >3 FB Neck ROM: full    Dental no notable dental hx.    Pulmonary asthma , former smoker,    Pulmonary exam normal breath sounds clear to auscultation       Cardiovascular hypertension, On Medications  Rhythm:regular Rate:Normal     Neuro/Psych    GI/Hepatic   Endo/Other    Renal/GU      Musculoskeletal   Abdominal   Peds  Hematology   Anesthesia Other Findings   Reproductive/Obstetrics                             Anesthesia Physical Anesthesia Plan  ASA: II  Anesthesia Plan: General   Post-op Pain Management:    Induction: Intravenous  Airway Management Planned: LMA  Additional Equipment:   Intra-op Plan:   Post-operative Plan:   Informed Consent: I have reviewed the patients History and Physical, chart, labs and discussed the procedure including the risks, benefits and alternatives for the proposed anesthesia with the patient or authorized representative who has indicated his/her understanding and acceptance.   Dental Advisory Given  Plan Discussed with: CRNA and Surgeon  Anesthesia Plan Comments: ( )        Anesthesia Quick Evaluation

## 2016-05-19 ENCOUNTER — Ambulatory Visit (HOSPITAL_COMMUNITY)
Admission: RE | Admit: 2016-05-19 | Discharge: 2016-05-19 | Disposition: A | Discharge status: Home / Self Care | Payer: BLUE CROSS/BLUE SHIELD | Source: Ambulatory Visit | Attending: General Surgery | Admitting: General Surgery

## 2016-05-19 ENCOUNTER — Ambulatory Visit: Payer: BLUE CROSS/BLUE SHIELD | Admitting: Hematology

## 2016-05-19 ENCOUNTER — Ambulatory Visit (HOSPITAL_COMMUNITY): Payer: BLUE CROSS/BLUE SHIELD | Admitting: Anesthesiology

## 2016-05-19 ENCOUNTER — Ambulatory Visit (HOSPITAL_COMMUNITY): Payer: BLUE CROSS/BLUE SHIELD

## 2016-05-19 ENCOUNTER — Encounter (HOSPITAL_COMMUNITY)
Admission: RE | Disposition: A | Discharge status: Home / Self Care | Payer: Self-pay | Source: Ambulatory Visit | Attending: General Surgery

## 2016-05-19 ENCOUNTER — Encounter: Payer: Self-pay | Admitting: Hematology

## 2016-05-19 ENCOUNTER — Encounter (HOSPITAL_COMMUNITY): Payer: Self-pay | Admitting: Certified Registered"

## 2016-05-19 ENCOUNTER — Ambulatory Visit: Payer: BLUE CROSS/BLUE SHIELD

## 2016-05-19 ENCOUNTER — Other Ambulatory Visit: Payer: BLUE CROSS/BLUE SHIELD

## 2016-05-19 DIAGNOSIS — Z9013 Acquired absence of bilateral breasts and nipples: Secondary | ICD-10-CM | POA: Diagnosis not present

## 2016-05-19 DIAGNOSIS — C50411 Malignant neoplasm of upper-outer quadrant of right female breast: Secondary | ICD-10-CM | POA: Diagnosis not present

## 2016-05-19 DIAGNOSIS — Z79899 Other long term (current) drug therapy: Secondary | ICD-10-CM | POA: Insufficient documentation

## 2016-05-19 DIAGNOSIS — Z452 Encounter for adjustment and management of vascular access device: Secondary | ICD-10-CM | POA: Insufficient documentation

## 2016-05-19 DIAGNOSIS — Z419 Encounter for procedure for purposes other than remedying health state, unspecified: Secondary | ICD-10-CM

## 2016-05-19 DIAGNOSIS — Z8701 Personal history of pneumonia (recurrent): Secondary | ICD-10-CM | POA: Diagnosis not present

## 2016-05-19 DIAGNOSIS — I10 Essential (primary) hypertension: Secondary | ICD-10-CM | POA: Diagnosis not present

## 2016-05-19 DIAGNOSIS — Z7951 Long term (current) use of inhaled steroids: Secondary | ICD-10-CM | POA: Diagnosis not present

## 2016-05-19 DIAGNOSIS — Z8041 Family history of malignant neoplasm of ovary: Secondary | ICD-10-CM | POA: Diagnosis not present

## 2016-05-19 DIAGNOSIS — Z7982 Long term (current) use of aspirin: Secondary | ICD-10-CM | POA: Diagnosis not present

## 2016-05-19 DIAGNOSIS — Z1501 Genetic susceptibility to malignant neoplasm of breast: Secondary | ICD-10-CM | POA: Diagnosis not present

## 2016-05-19 DIAGNOSIS — Z8249 Family history of ischemic heart disease and other diseases of the circulatory system: Secondary | ICD-10-CM | POA: Diagnosis not present

## 2016-05-19 DIAGNOSIS — C50911 Malignant neoplasm of unspecified site of right female breast: Secondary | ICD-10-CM | POA: Diagnosis not present

## 2016-05-19 DIAGNOSIS — C50111 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 female breast: Secondary | ICD-10-CM | POA: Diagnosis not present

## 2016-05-19 DIAGNOSIS — Z8042 Family history of malignant neoplasm of prostate: Secondary | ICD-10-CM | POA: Diagnosis not present

## 2016-05-19 DIAGNOSIS — Z87891 Personal history of nicotine dependence: Secondary | ICD-10-CM | POA: Insufficient documentation

## 2016-05-19 DIAGNOSIS — Z803 Family history of malignant neoplasm of breast: Secondary | ICD-10-CM | POA: Diagnosis not present

## 2016-05-19 DIAGNOSIS — Z95828 Presence of other vascular implants and grafts: Secondary | ICD-10-CM

## 2016-05-19 DIAGNOSIS — J45909 Unspecified asthma, uncomplicated: Secondary | ICD-10-CM | POA: Diagnosis not present

## 2016-05-19 HISTORY — PX: PORTACATH PLACEMENT: SHX2246

## 2016-05-19 LAB — BASIC METABOLIC PANEL
Anion gap: 15 (ref 5–15)
BUN: 11 mg/dL (ref 6–20)
CALCIUM: 9.6 mg/dL (ref 8.9–10.3)
CO2: 23 mmol/L (ref 22–32)
Chloride: 101 mmol/L (ref 101–111)
Creatinine, Ser: 0.63 mg/dL (ref 0.44–1.00)
GFR calc Af Amer: >60 mL/min (ref >60)
Glucose, Bld: 93 mg/dL (ref 65–99)
POTASSIUM: 3.9 mmol/L (ref 3.5–5.1)
SODIUM: 139 mmol/L (ref 135–145)

## 2016-05-19 LAB — CBC
HEMATOCRIT: 34.9 % — AB (ref 36.0–46.0)
Hemoglobin: 11.5 g/dL — ABNORMAL LOW (ref 12.0–15.0)
MCH: 32.1 pg (ref 26.0–34.0)
MCHC: 33 g/dL (ref 30.0–36.0)
MCV: 97.5 fL (ref 78.0–100.0)
PLATELETS: 280 10*3/uL (ref 150–400)
RBC: 3.58 MIL/uL — ABNORMAL LOW (ref 3.87–5.11)
RDW: 13.5 % (ref 11.5–15.5)
WBC: 5.1 10*3/uL (ref 4.0–10.5)

## 2016-05-19 SURGERY — INSERTION, TUNNELED CENTRAL VENOUS DEVICE, WITH PORT
Anesthesia: General | Site: Chest | Laterality: Right

## 2016-05-19 MED ORDER — KETOROLAC TROMETHAMINE 15 MG/ML IJ SOLN
15.0000 mg | Freq: Four times a day (QID) | INTRAMUSCULAR | Status: DC
  Administered 2016-05-19: 15 mg via INTRAVENOUS

## 2016-05-19 MED ORDER — BUPIVACAINE HCL (PF) 0.25 % IJ SOLN
INTRAMUSCULAR | Status: AC
  Filled 2016-05-19: qty 30

## 2016-05-19 MED ORDER — HEPARIN SODIUM (PORCINE) 5000 UNIT/ML IJ SOLN
INTRAMUSCULAR | Status: DC | PRN
  Administered 2016-05-19: 500 mL

## 2016-05-19 MED ORDER — FENTANYL CITRATE (PF) 100 MCG/2ML IJ SOLN
INTRAMUSCULAR | Status: DC | PRN
  Administered 2016-05-19 (×2): 25 ug via INTRAVENOUS

## 2016-05-19 MED ORDER — PROPOFOL 10 MG/ML IV BOLUS
INTRAVENOUS | Status: AC
  Filled 2016-05-19: qty 20

## 2016-05-19 MED ORDER — FENTANYL CITRATE (PF) 250 MCG/5ML IJ SOLN
INTRAMUSCULAR | Status: AC
  Filled 2016-05-19: qty 5

## 2016-05-19 MED ORDER — 0.9 % SODIUM CHLORIDE (POUR BTL) OPTIME
TOPICAL | Status: DC | PRN
  Administered 2016-05-19: 1000 mL

## 2016-05-19 MED ORDER — HEPARIN SOD (PORK) LOCK FLUSH 100 UNIT/ML IV SOLN
INTRAVENOUS | Status: DC | PRN
  Administered 2016-05-19: 500 [IU] via INTRAVENOUS

## 2016-05-19 MED ORDER — CHLORHEXIDINE GLUCONATE CLOTH 2 % EX PADS: 6.0000 | MEDICATED_PAD | Freq: Once | CUTANEOUS | Status: DC

## 2016-05-19 MED ORDER — MIDAZOLAM HCL 5 MG/5ML IJ SOLN
INTRAMUSCULAR | Status: DC | PRN
  Administered 2016-05-19: 2 mg via INTRAVENOUS

## 2016-05-19 MED ORDER — LIDOCAINE 2% (20 MG/ML) 5 ML SYRINGE
INTRAMUSCULAR | Status: DC | PRN
  Administered 2016-05-19: 100 mg via INTRAVENOUS

## 2016-05-19 MED ORDER — MIDAZOLAM HCL 2 MG/2ML IJ SOLN
INTRAMUSCULAR | Status: AC
  Filled 2016-05-19: qty 2

## 2016-05-19 MED ORDER — BUPIVACAINE HCL (PF) 0.25 % IJ SOLN
INTRAMUSCULAR | Status: DC | PRN
  Administered 2016-05-19: 8 mL

## 2016-05-19 MED ORDER — LACTATED RINGERS IV SOLN
INTRAVENOUS | Status: DC | PRN
  Administered 2016-05-19: 07:00:00 via INTRAVENOUS

## 2016-05-19 MED ORDER — ACETAMINOPHEN 500 MG PO TABS
1000.0000 mg | ORAL_TABLET | ORAL | Status: AC
  Administered 2016-05-19: 1000 mg via ORAL
  Filled 2016-05-19: qty 2

## 2016-05-19 MED ORDER — PROPOFOL 10 MG/ML IV BOLUS
INTRAVENOUS | Status: DC | PRN
  Administered 2016-05-19: 180 mg via INTRAVENOUS
  Administered 2016-05-19: 20 mg via INTRAVENOUS

## 2016-05-19 MED ORDER — HEPARIN SOD (PORK) LOCK FLUSH 100 UNIT/ML IV SOLN
INTRAVENOUS | Status: AC
  Filled 2016-05-19: qty 5

## 2016-05-19 MED ORDER — KETOROLAC TROMETHAMINE 30 MG/ML IJ SOLN
INTRAMUSCULAR | Status: AC
  Filled 2016-05-19: qty 1

## 2016-05-19 MED ORDER — MORPHINE SULFATE (PF) 2 MG/ML IV SOLN: 2.0000 mg | INTRAVENOUS | Status: DC | PRN

## 2016-05-19 MED ORDER — OXYCODONE HCL 5 MG PO TABS: 5.0000 mg | ORAL_TABLET | ORAL | Status: DC | PRN

## 2016-05-19 SURGICAL SUPPLY — 47 items
BAG DECANTER FOR FLEXI CONT (MISCELLANEOUS) ×2 IMPLANT
BLADE SURG 11 STRL SS (BLADE) ×2 IMPLANT
BLADE SURG 15 STRL LF DISP TIS (BLADE) ×1 IMPLANT
BLADE SURG 15 STRL SS (BLADE) ×1
CHLORAPREP W/TINT 26ML (MISCELLANEOUS) ×2 IMPLANT
COVER SURGICAL LIGHT HANDLE (MISCELLANEOUS) ×2 IMPLANT
COVER TRANSDUCER ULTRASND GEL (DRAPE) ×2 IMPLANT
CRADLE DONUT ADULT HEAD (MISCELLANEOUS) ×2 IMPLANT
DECANTER SPIKE VIAL GLASS SM (MISCELLANEOUS) ×2 IMPLANT
DERMABOND ADVANCED (GAUZE/BANDAGES/DRESSINGS) ×1
DERMABOND ADVANCED .7 DNX12 (GAUZE/BANDAGES/DRESSINGS) ×1 IMPLANT
DRAPE C-ARM 42X72 X-RAY (DRAPES) ×2 IMPLANT
DRAPE CHEST BREAST 15X10 FENES (DRAPES) ×2 IMPLANT
DRAPE UTILITY XL STRL (DRAPES) ×2 IMPLANT
ELECT CAUTERY BLADE 6.4 (BLADE) ×2 IMPLANT
ELECT REM PT RETURN 9FT ADLT (ELECTROSURGICAL) ×2
ELECTRODE REM PT RTRN 9FT ADLT (ELECTROSURGICAL) ×1 IMPLANT
GAUZE SPONGE 4X4 16PLY XRAY LF (GAUZE/BANDAGES/DRESSINGS) ×2 IMPLANT
GEL ULTRASOUND 20GR AQUASONIC (MISCELLANEOUS) ×2 IMPLANT
GLOVE BIO SURGEON STRL SZ7 (GLOVE) ×2 IMPLANT
GLOVE BIOGEL PI IND STRL 7.5 (GLOVE) ×1 IMPLANT
GLOVE BIOGEL PI INDICATOR 7.5 (GLOVE) ×1
GOWN STRL REUS W/ TWL LRG LVL3 (GOWN DISPOSABLE) ×3 IMPLANT
GOWN STRL REUS W/TWL LRG LVL3 (GOWN DISPOSABLE) ×3
INTRODUCER COOK 11FR (CATHETERS) IMPLANT
KIT BASIN OR (CUSTOM PROCEDURE TRAY) ×2 IMPLANT
KIT PORT POWER 8FR ISP CVUE (Catheter) ×2 IMPLANT
KIT ROOM TURNOVER OR (KITS) ×2 IMPLANT
NEEDLE HYPO 25GX1X1/2 BEV (NEEDLE) ×2 IMPLANT
NS IRRIG 1000ML POUR BTL (IV SOLUTION) ×2 IMPLANT
PACK SURGICAL SETUP 50X90 (CUSTOM PROCEDURE TRAY) ×2 IMPLANT
PAD ARMBOARD 7.5X6 YLW CONV (MISCELLANEOUS) ×4 IMPLANT
PENCIL BUTTON HOLSTER BLD 10FT (ELECTRODE) ×2 IMPLANT
SET INTRODUCER 12FR PACEMAKER (SHEATH) IMPLANT
SET SHEATH INTRODUCER 10FR (MISCELLANEOUS) IMPLANT
SHEATH COOK PEEL AWAY SET 9F (SHEATH) IMPLANT
SUT MNCRL AB 4-0 PS2 18 (SUTURE) ×2 IMPLANT
SUT PROLENE 2 0 SH DA (SUTURE) ×2 IMPLANT
SUT SILK 2 0 (SUTURE)
SUT SILK 2-0 18XBRD TIE 12 (SUTURE) IMPLANT
SUT VIC AB 3-0 SH 27 (SUTURE) ×1
SUT VIC AB 3-0 SH 27XBRD (SUTURE) ×1 IMPLANT
SYR 20ML ECCENTRIC (SYRINGE) ×4 IMPLANT
SYR 5ML LUER SLIP (SYRINGE) ×2 IMPLANT
SYR CONTROL 10ML LL (SYRINGE) IMPLANT
TOWEL OR 17X24 6PK STRL BLUE (TOWEL DISPOSABLE) ×2 IMPLANT
TOWEL OR 17X26 10 PK STRL BLUE (TOWEL DISPOSABLE) ×2 IMPLANT

## 2016-05-19 NOTE — Progress Notes (Signed)
Arrived to pacu with iv infusing  

## 2016-05-19 NOTE — Discharge Instructions (Signed)
    PORT-A-CATH: POST OP INSTRUCTIONS  Always review your discharge instruction sheet given to you by the facility where your surgery was performed.   1. A prescription for pain medication may be given to you upon discharge. Take your pain medication as prescribed, if needed. If narcotic pain medicine is not needed, then you make take acetaminophen (Tylenol) or ibuprofen (Advil) as needed.  2. Take your usually prescribed medications unless otherwise directed. 3. If you need a refill on your pain medication, please contact our office. All narcotic pain medicine now requires a paper prescription.  Phoned in and fax refills are no longer allowed by law.  Prescriptions will not be filled after 5 pm or on weekends.  4. You should follow a light diet for the remainder of the day after your procedure. 5. Most patients will experience some mild swelling and/or bruising in the area of the incision. It may take several days to resolve. 6. It is common to experience some constipation if taking pain medication after surgery. Increasing fluid intake and taking a stool softener (such as Colace) will usually help or prevent this problem from occurring. A mild laxative (Milk of Magnesia or Miralax) should be taken according to package directions if there are no bowel movements after 48 hours.  7. Unless discharge instructions indicate otherwise, you may remove your bandages 48 hours after surgery, and you may shower at that time. You may have steri-strips (small white skin tapes) in place directly over the incision.  These strips should be left on the skin for 7-10 days.  If your surgeon used Dermabond (skin glue) on the incision, you may shower in 24 hours.  The glue will flake off over the next 2-3 weeks.  8. If your port is left accessed at the end of surgery (needle left in port), the dressing cannot get wet and should only by changed by a healthcare professional. When the port is no longer accessed (when the  needle has been removed), follow step 7.   9. ACTIVITIES:  Limit activity involving your arms for the next 72 hours. Do no strenuous exercise or activity for 1 week. You may drive when you are no longer taking prescription pain medication, you can comfortably wear a seatbelt, and you can maneuver your car. 10.You may need to see your doctor in the office for a follow-up appointment.  Please       check with your doctor.  11.When you receive a new Port-a-Cath, you will get a product guide and        ID card.  Please keep them in case you need them.  WHEN TO CALL YOUR DOCTOR ([phone]): 1. Fever over 101.0 2. Chills 3. Continued bleeding from incision 4. Increased redness and tenderness at the site 5. Shortness of breath, difficulty breathing   The clinic staff is available to answer your questions during regular business hours. Please don't hesitate to call and ask to speak to one of the nurses or medical assistants for clinical concerns. If you have a medical emergency, go to the nearest emergency room or call 911.  A surgeon from [facility] is always on call at the hospital.     For further information, please visit [email or web address]      

## 2016-05-19 NOTE — Progress Notes (Signed)
Called pt to introduce myself as her Arboriculturist and to discuss copay assistance.  Left a message and requested she return my call at her earliest convenience or I will see her before her appointments on 05/20/16.

## 2016-05-19 NOTE — H&P (Signed)
Chelsea Salinas is an 61 y.o. female.   Chief Complaint: breast cancer HPI:   42 yof referred by [name] for newly diagnosed right breast cancer. She has family history of breast cancer in 2 cousins as well as multiple relatives with ovarian cancer (some of which she says have had genetic testing but do not have results today). she had no mass or dc. she underwent screening mm that shows c density breasts. she has distortion in right breast. on [place] there is a 1.7x1.4x1.3 cm mass and an axillary node with abnormal cortex. node biopsy is negative and was node. 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 she has no personal history of breast disease. She has undergone bilateral mastectomies with high oncotype and is due to get chemotherapy   Past Medical History:  Diagnosis Date  . Abnormal Pap smear of cervix    --in her 20s had colpo and some type of treatment to cervix.  . Allergy   . Asthma    "all the time, throughout the yr"  . 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 Breast cancer, right breast ([place]) 02/2016  . Eczema   . Family history of breast cancer   . Family history of genetic disease carrier    paternal relatives with a BRCA mutation  . Family history of ovarian cancer   . Hypertension   . Pneumonia    2000ish  . STD (sexually transmitted disease)    HSV II    Past Surgical History:  Procedure Laterality Date  . BREAST BIOPSY Right 04/13/2016   Procedure: Evacuation RIGHT Breast  Hematoma;  Surgeon: [name], MD;  Location: [facility];  Service: General;  Laterality: Right;  . COLONOSCOPY    . DILATION AND CURETTAGE OF UTERUS  2008  . MASTECTOMY Left 04/12/2016   prophylactic total mastectomy  . MASTECTOMY COMPLETE / SIMPLE W/ SENTINEL NODE BIOPSY Right 04/12/2016   axillary sentinel LNB  . MASTECTOMY W/ SENTINEL NODE BIOPSY Bilateral 04/12/2016   Procedure: BILATERAL TOTAL MASTECTOMIES  WITH  RIGHT SENTINEL LYMPH NODE BIOPSY;  Surgeon: [name], MD;  Location: [facility];  Service: General;  Laterality: Bilateral;    Family History  Problem Relation Age of Onset  . Polymyalgia rheumatica Mother   . Hypertension Father   . Heart disease Father     Dec 69  . Heart attack Father   . Prostate cancer Father     Dx 61s; deceased 100  . Breast cancer Cousin     Dx 61s; daughter of a paternal uncle  . Ovarian cancer Paternal Aunt     Dx 24; deceased  . Ovarian cancer Cousin     Dx 29s; daughter of a paternal uncle  . Ovarian cancer Cousin     Dx 77; daughter of a paternal uncle  . Ovarian cancer Paternal Aunt     Dx 33s.  BRCA positive  . Ovarian cancer Other     pat grandfather's mother   Social History:  reports that she quit smoking about 20 years ago. She has a 25.00 pack-year smoking history. She has never used smokeless tobacco. She reports that she drinks about 13.2 oz of alcohol per week . She reports that she does not use drugs.  Allergies:  Allergies  Allergen Reactions  . No Known Allergies     Medications Prior to Admission  Medication  Sig Dispense Refill  . aspirin EC 81 MG tablet Take 81 mg by mouth daily.    . cetirizine (ZYRTEC) 10 MG tablet Take 10 mg by mouth at bedtime.     . Cholecalciferol (VITAMIN D) 2000 units tablet Take 2,000 Units by mouth daily.    . cloNIDine (CATAPRES) 0.2 MG tablet Take 1 tablet by mouth at bedtime.   0  . escitalopram (LEXAPRO) 10 MG tablet Take 1 tablet by mouth daily at 12 noon.   0  . Flaxseed, Linseed, (FLAXSEED OIL PO) Take 1,400 mg by mouth every evening.    . fluticasone (FLONASE ALLERGY RELIEF) 50 MCG/ACT nasal spray Place 1 spray into both nostrils daily.    Marland Kitchen losartan (COZAAR) 100 MG tablet Take 1 tablet (100 mg total) by mouth daily. 30 tablet 11  . magnesium oxide (MAG-OX) 400 MG tablet Take 400 mg by mouth every other day.    . montelukast (SINGULAIR) 10 MG tablet TAKE 1 TABLET(10 MG) BY MOUTH DAILY  (Patient taking differently: TAKE 1 TABLET BY MOUTH AS NEEDED FOR SHORTNESS OF BREATH) 90 tablet 0  . Multiple Vitamins-Minerals (MULTIVITAMIN WITH MINERALS) tablet Take 1 tablet by mouth every evening.     . Omega-3 Fatty Acids ([employer] FISH OIL) 1200 MG CAPS Take 1,200 mg by mouth every evening.     . venlafaxine XR (EFFEXOR-XR) 75 MG 24 hr capsule TAKE 2 CAPSULES BY MOUTH EVERY MORNING AND 1 CAPSULE EVERY EVENING 270 capsule 0  . vitamin B-12 (CYANOCOBALAMIN) 100 MCG tablet Take 100 mcg by mouth every other day.     Marland Kitchen acetaminophen (TYLENOL) 500 MG tablet Take 500-1,000 mg by mouth every 6 (six) hours as needed for mild pain or headache (depends on pain if takes 1-2 tablets).    . lidocaine-prilocaine (EMLA) cream Apply 1 application topically as needed. 30 g 3  . ondansetron (ZOFRAN) 8 MG tablet Take 1 tablet (8 mg total) by mouth 2 (two) times daily as needed. Start on the third day after chemotherapy. 30 tablet 1  . prochlorperazine (COMPAZINE) 10 MG tablet Take 1 tablet (10 mg total) by mouth every 6 (six) hours as needed (Nausea or vomiting). 30 tablet 1  . valACYclovir (VALTREX) 500 MG tablet Take 1 tablet (500 mg total) by mouth 2 (two) times daily. Take for 3 days as needed. 30 tablet 1    No results found for this or any previous visit (from the past 48 hour(s)). No results found.  ROS General Not Present- Appetite Loss, Chills, Fatigue, Fever, Night Sweats, Weight Gain and Weight Loss. Skin Present- Dryness. Not Present- Change in Wart/Mole, Hives, Jaundice, New Lesions, Non-Healing Wounds, Rash and Ulcer. HEENT Present- Seasonal Allergies and Wears glasses/contact lenses. Not Present- Earache, Hearing Loss, Hoarseness, Nose Bleed, Oral Ulcers, Ringing in the Ears, Sinus Pain, Sore Throat, Visual Disturbances and Yellow Eyes. Respiratory Present- Snoring. Not Present- Bloody sputum, Chronic Cough, Difficulty Breathing and Wheezing. Breast Present- Breast Mass. Not Present- Breast Pain,  Nipple Discharge and Skin Changes. Cardiovascular Not Present- Chest Pain, Difficulty Breathing Lying Down, Leg Cramps, Palpitations, Rapid Heart Rate, Shortness of Breath and Swelling of Extremities. Gastrointestinal Not Present- Abdominal Pain, Bloating, Bloody Stool, Change in Bowel Habits, Chronic diarrhea, Constipation, Difficulty Swallowing, Excessive gas, Gets full quickly at meals, Hemorrhoids, Indigestion, Nausea, Rectal Pain and Vomiting. Female Genitourinary Not Present- Frequency, Nocturia, Painful Urination, Pelvic Pain and Urgency. Musculoskeletal Not Present- Back Pain, Joint Pain, Joint Stiffness, Muscle Pain, Muscle Weakness and Swelling of Extremities.  Neurological Not Present- Decreased Memory, Fainting, Headaches, Numbness, Seizures, Tingling, Tremor, Trouble walking and Weakness. Psychiatric Not Present- Anxiety, Bipolar, Change in Sleep Pattern, Depression, Fearful and Frequent crying. Endocrine Present- Hot flashes. Not Present- Cold Intolerance, Excessive Hunger, Hair Changes, Heat Intolerance and New Diabetes. Hematology Present- Blood Thinners. Not Present- Easy Bruising, Excessive bleeding, Gland problems, HIV and Persistent Infections.   Blood pressure (!) 165/96, pulse 72, temperature 98.6 F (37 C), temperature source Oral, resp. rate 16, last menstrual period 02/07/1998, SpO2 98 %. Physical Exam  Physical Exam  General Mental Status-Alert. Orientation-Oriented X3. Chest and Lung Exam Chest and lung exam reveals -on auscultation, normal breath sounds, no adventitious sounds and normal vocal resonance. Breast Bilateral mastectomies infection resolved Abdomen Note: soft nt Lymphatic Head & Neck General Head & Neck Lymphatics: Bilateral - Description - Normal. Axillary General Axillary Region: Bilateral - Description - Normal. Note: no [place] adenopathy  Assessment/Plan Breast cancer with high oncotype  Port placement  [facility],  MD 05/19/2016, 6:59 AM

## 2016-05-19 NOTE — Anesthesia Procedure Notes (Signed)
Procedure Name: LMA Insertion Date/Time: 05/19/2016 7:36 AM Performed by: Manuela Schwartz B Pre-anesthesia Checklist: Patient identified, Emergency Drugs available, Suction available, Patient being monitored and Timeout performed Patient Re-evaluated:Patient Re-evaluated prior to inductionOxygen Delivery Method: Circle system utilized Preoxygenation: Pre-oxygenation with 100% oxygen Intubation Type: IV induction LMA: LMA inserted LMA Size: 4.0 Number of attempts: 1 Placement Confirmation: positive ETCO2 and breath sounds checked- equal and bilateral Tube secured with: Tape Dental Injury: Teeth and Oropharynx as per pre-operative assessment

## 2016-05-19 NOTE — Anesthesia Postprocedure Evaluation (Signed)
Anesthesia Post Note  Patient: Chelsea Salinas  Procedure(s) Performed: Procedure(s) (LRB): INSERTION PORT-A-CATH WITH [place] (Right)  Patient location during evaluation: PACU Anesthesia Type: General Level of consciousness: awake and alert Pain management: pain level controlled Vital Signs Assessment: post-procedure vital signs reviewed and stable Respiratory status: spontaneous breathing, nonlabored ventilation, respiratory function stable and patient connected to nasal cannula oxygen Cardiovascular status: blood pressure returned to baseline and stable Postop Assessment: no signs of nausea or vomiting Anesthetic complications: no       Last Vitals:  Vitals:   05/19/16 0930 05/19/16 0940  BP: 135/88 137/90  Pulse: 69 73  Resp: 11 14  Temp: 36.6 C     Last Pain:  Vitals:   05/19/16 0930  TempSrc:   PainSc: 0-No pain                 Audriana Aldama EDWARD

## 2016-05-19 NOTE — Transfer of Care (Signed)
Immediate Anesthesia Transfer of Care Note  Patient: Chelsea Salinas  Procedure(s) Performed: Procedure(s): INSERTION PORT-A-CATH WITH [place] (Right)  Patient Location: PACU  Anesthesia Type:General  Level of Consciousness: awake, alert  and oriented  Airway & Oxygen Therapy: Patient Spontanous Breathing  Post-op Assessment: Report given to RN and Post -op Vital signs reviewed and stable  Post vital signs: Reviewed and stable  Last Vitals:  Vitals:   05/19/16 0830 05/19/16 0845  BP: 139/76 (!) 144/93  Pulse: 74 70  Resp: 10 14  Temp:      Last Pain:  Vitals:   05/19/16 0830  TempSrc:   PainSc: 0-No pain      Patients Stated Pain Goal: 3 (73/71/06 2694)  Complications: No apparent anesthesia complications

## 2016-05-19 NOTE — Op Note (Signed)
Preoperative diagnosis:breast cancer need for venous access Postoperative diagnosis: same as above Procedure: right ij US guided powerport insertion Surgeon: Dr Serita Grammes EBL: minimal Anes: general  Specimens none Complications none Drains none Sponge count correct Dispo to pacu stable  Indications: This is a 56 yof s/p bilateral mastectomies.  She has high oncotype and is due to begin chemotherapy tomorrow. We discussed port placement.   Procedure: After informed consent was obtained the patient was taken to the operating room. She was given antibiotics. Sequential compression devices were on her legs. She was then placed under general anesthesia with an LMA. Then she was prepped and draped in the standard sterile surgical fashion. Surgical timeout was then performed.  I used the ultrasound to identify the right internal jugular vein. I then accessed the vein using the ultrasound.This aspirated blood. I then placed the wire. This was confirmed by fluoroscopy and ultrasound to be in the correct position. I created a pocket on the right chest.I tunneled the line between the 2 sites. I then dilated the tract and placed the dilator assembly with the sheath. This was done under fluoroscopy. I then removed the sheath and dilator. The wire was also removed. The line was then pulled back to be in the venacava. I hooked this up to the port. I sutured this into place with 2-0 Prolene in 2 places. This aspirated blood and flushed easily.This was confirmed with a final fluoroscopy. I then accessed the port for chemotherapy tomorrow and placed heparin in this. I then closed this with 2-0 Vicryl and 4-0 Monocryl. Dermabond was placed on both the incisions.A dressing was placed. She tolerated this well and was transferred to the recovery room in stable condition

## 2016-05-20 ENCOUNTER — Ambulatory Visit (HOSPITAL_BASED_OUTPATIENT_CLINIC_OR_DEPARTMENT_OTHER): Payer: BLUE CROSS/BLUE SHIELD | Admitting: Hematology

## 2016-05-20 ENCOUNTER — Other Ambulatory Visit (HOSPITAL_BASED_OUTPATIENT_CLINIC_OR_DEPARTMENT_OTHER): Payer: BLUE CROSS/BLUE SHIELD

## 2016-05-20 ENCOUNTER — Encounter (HOSPITAL_COMMUNITY): Payer: Self-pay

## 2016-05-20 ENCOUNTER — Encounter (HOSPITAL_COMMUNITY): Payer: Self-pay | Admitting: General Surgery

## 2016-05-20 ENCOUNTER — Encounter: Payer: Self-pay | Admitting: *Deleted

## 2016-05-20 ENCOUNTER — Telehealth: Payer: Self-pay | Admitting: Hematology

## 2016-05-20 ENCOUNTER — Ambulatory Visit (HOSPITAL_BASED_OUTPATIENT_CLINIC_OR_DEPARTMENT_OTHER): Payer: BLUE CROSS/BLUE SHIELD

## 2016-05-20 ENCOUNTER — Ambulatory Visit: Payer: BLUE CROSS/BLUE SHIELD

## 2016-05-20 VITALS — BP 139/82 | HR 79 | Temp 98.5°F | Resp 18 | Ht 64.5 in | Wt 153.2 lb

## 2016-05-20 DIAGNOSIS — C50111 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 female breast: Secondary | ICD-10-CM | POA: Diagnosis not present

## 2016-05-20 DIAGNOSIS — C50811 Malignant neoplasm of overlapping sites of right female breast: Secondary | ICD-10-CM

## 2016-05-20 DIAGNOSIS — Z1501 Genetic susceptibility to malignant neoplasm of breast: Secondary | ICD-10-CM | POA: Diagnosis not present

## 2016-05-20 DIAGNOSIS — Z5111 Encounter for antineoplastic chemotherapy: Secondary | ICD-10-CM | POA: Diagnosis not present

## 2016-05-20 DIAGNOSIS — C50919 Malignant neoplasm of unspecified site of unspecified female breast: Secondary | ICD-10-CM

## 2016-05-20 DIAGNOSIS — I10 Essential (primary) hypertension: Secondary | ICD-10-CM

## 2016-05-20 DIAGNOSIS — Z1509 Genetic susceptibility to other malignant neoplasm: Secondary | ICD-10-CM

## 2016-05-20 DIAGNOSIS — N951 Menopausal and female climacteric states: Secondary | ICD-10-CM

## 2016-05-20 DIAGNOSIS — Z170 Estrogen receptor positive status [ER+]: Principal | ICD-10-CM

## 2016-05-20 DIAGNOSIS — Z1502 Genetic susceptibility to malignant neoplasm of ovary: Secondary | ICD-10-CM | POA: Diagnosis not present

## 2016-05-20 LAB — COMPREHENSIVE METABOLIC PANEL
ALBUMIN: 3.8 g/dL (ref 3.5–5.0)
ALK PHOS: 91 U/L (ref 40–150)
ALT: 13 U/L (ref 0–55)
AST: 17 U/L (ref 5–34)
Anion Gap: 8 mEq/L (ref 3–11)
BILIRUBIN TOTAL: 0.25 mg/dL (ref 0.20–1.20)
BUN: 12.4 mg/dL (ref 7.0–26.0)
CO2: 28 mEq/L (ref 22–29)
Calcium: 9.3 mg/dL (ref 8.4–10.4)
Chloride: 107 mEq/L (ref 98–109)
Creatinine: 0.7 mg/dL (ref 0.6–1.1)
EGFR: 89 mL/min/{1.73_m2} — ABNORMAL LOW (ref >90)
GLUCOSE: 105 mg/dL (ref 70–140)
Potassium: 4.1 mEq/L (ref 3.5–5.1)
SODIUM: 143 meq/L (ref 136–145)
TOTAL PROTEIN: 6.5 g/dL (ref 6.4–8.3)

## 2016-05-20 LAB — CBC WITH DIFFERENTIAL/PLATELET
BASO%: 0.7 % (ref 0.0–2.0)
Basophils Absolute: 0 10*3/uL (ref 0.0–0.1)
EOS%: 2.9 % (ref 0.0–7.0)
Eosinophils Absolute: 0.1 10*3/uL (ref 0.0–0.5)
HCT: 35.1 % (ref 34.8–46.6)
HGB: 11.8 g/dL (ref 11.6–15.9)
LYMPH%: 33.9 % (ref 14.0–49.7)
MCH: 33.2 pg (ref 25.1–34.0)
MCHC: 33.5 g/dL (ref 31.5–36.0)
MCV: 99 fL (ref 79.5–101.0)
MONO#: 0.4 10*3/uL (ref 0.1–0.9)
MONO%: 9.6 % (ref 0.0–14.0)
NEUT%: 52.9 % (ref 38.4–76.8)
NEUTROS ABS: 2.3 10*3/uL (ref 1.5–6.5)
Platelets: 279 10*3/uL (ref 145–400)
RBC: 3.55 10*6/uL — ABNORMAL LOW (ref 3.70–5.45)
RDW: 13.9 % (ref 11.2–14.5)
WBC: 4.4 10*3/uL (ref 3.9–10.3)
lymph#: 1.5 10*3/uL (ref 0.9–3.3)

## 2016-05-20 MED ORDER — PALONOSETRON HCL INJECTION 0.25 MG/5ML
INTRAVENOUS | Status: AC
  Filled 2016-05-20: qty 5

## 2016-05-20 MED ORDER — DOXORUBICIN HCL CHEMO IV INJECTION 2 MG/ML
60.0000 mg/m2 | Freq: Once | INTRAVENOUS | Status: AC
  Administered 2016-05-20: 106 mg via INTRAVENOUS
  Filled 2016-05-20: qty 53

## 2016-05-20 MED ORDER — SODIUM CHLORIDE 0.9 % IV SOLN
Freq: Once | INTRAVENOUS | Status: AC
  Administered 2016-05-20: 12:00:00 via INTRAVENOUS
  Filled 2016-05-20: qty 5

## 2016-05-20 MED ORDER — SODIUM CHLORIDE 0.9 % IV SOLN
Freq: Once | INTRAVENOUS | Status: AC
  Administered 2016-05-20: 12:00:00 via INTRAVENOUS

## 2016-05-20 MED ORDER — SODIUM CHLORIDE 0.9% FLUSH
10.0000 mL | INTRAVENOUS | Status: DC | PRN
  Administered 2016-05-20: 10 mL
  Filled 2016-05-20: qty 10

## 2016-05-20 MED ORDER — PEGFILGRASTIM 6 MG/0.6ML ~~LOC~~ PSKT
6.0000 mg | PREFILLED_SYRINGE | Freq: Once | SUBCUTANEOUS | Status: AC
  Administered 2016-05-20: 6 mg via SUBCUTANEOUS
  Filled 2016-05-20: qty 0.6

## 2016-05-20 MED ORDER — PALONOSETRON HCL INJECTION 0.25 MG/5ML
0.2500 mg | Freq: Once | INTRAVENOUS | Status: AC
  Administered 2016-05-20: 0.25 mg via INTRAVENOUS

## 2016-05-20 MED ORDER — SODIUM CHLORIDE 0.9 % IJ SOLN
10.0000 mL | Freq: Once | INTRAMUSCULAR | Status: DC
  Filled 2016-05-20: qty 10

## 2016-05-20 MED ORDER — SODIUM CHLORIDE 0.9 % IV SOLN
600.0000 mg/m2 | Freq: Once | INTRAVENOUS | Status: AC
  Administered 2016-05-20: 1060 mg via INTRAVENOUS
  Filled 2016-05-20: qty 53

## 2016-05-20 MED ORDER — HEPARIN SOD (PORK) LOCK FLUSH 100 UNIT/ML IV SOLN
500.0000 [IU] | Freq: Once | INTRAVENOUS | Status: AC | PRN
  Administered 2016-05-20: 500 [IU]
  Filled 2016-05-20: qty 5

## 2016-05-20 NOTE — Patient Instructions (Signed)
[facility] Discharge Instructions for Patients Receiving Chemotherapy  Today you received the following chemotherapy agents: Adrucil, Cytoxan   To help prevent nausea and vomiting after your treatment, we encourage you to take your nausea medication as prescribed.    If you develop nausea and vomiting that is not controlled by your nausea medication, call the clinic.   BELOW ARE SYMPTOMS THAT SHOULD BE REPORTED IMMEDIATELY:  *FEVER GREATER THAN 100.5 F  *CHILLS WITH OR WITHOUT FEVER  NAUSEA AND VOMITING THAT IS NOT CONTROLLED WITH YOUR NAUSEA MEDICATION  *UNUSUAL SHORTNESS OF BREATH  *UNUSUAL BRUISING OR BLEEDING  TENDERNESS IN MOUTH AND THROAT WITH OR WITHOUT PRESENCE OF ULCERS  *URINARY PROBLEMS  *BOWEL PROBLEMS  UNUSUAL RASH Items with * indicate a potential emergency and should be followed up as soon as possible.  Feel free to call the clinic you have any questions or concerns. The clinic phone number is [phone].  Please show the [facility] at check-in to the Emergency Department and triage nurse.  Cyclophosphamide injection What is this medicine? CYCLOPHOSPHAMIDE (sye kloe FOSS fa mide) is a chemotherapy drug. It slows the growth of cancer cells. This medicine is used to treat many types of cancer like lymphoma, myeloma, leukemia, breast cancer, and ovarian cancer, to name a few. This medicine may be used for other purposes; ask your health care provider or pharmacist if you have questions. COMMON BRAND NAME(S): Cytoxan, Neosar What should I tell my health care provider before I take this medicine? They need to know if you have any of these conditions: -blood disorders -history of other chemotherapy -infection -kidney disease -liver disease -recent or ongoing radiation therapy -tumors in the bone marrow -an unusual or allergic reaction to cyclophosphamide, other chemotherapy, other medicines, foods, dyes, or preservatives -pregnant or  trying to get pregnant -breast-feeding How should I use this medicine? This drug is usually given as an injection into a vein or muscle or by infusion into a vein. It is administered in a hospital or clinic by a specially trained health care professional. Talk to your pediatrician regarding the use of this medicine in children. Special care may be needed. Overdosage: If you think you have taken too much of this medicine contact a poison control center or emergency room at once. NOTE: This medicine is only for you. Do not share this medicine with others. What if I miss a dose? It is important not to miss your dose. Call your doctor or health care professional if you are unable to keep an appointment. What may interact with this medicine? This medicine may interact with the following medications: -amiodarone -amphotericin B -azathioprine -certain antiviral medicines for HIV or AIDS such as protease inhibitors (e.g., indinavir, ritonavir) and zidovudine -certain blood pressure medications such as benazepril, captopril, enalapril, fosinopril, lisinopril, moexipril, monopril, perindopril, quinapril, ramipril, trandolapril -certain cancer medications such as anthracyclines (e.g., daunorubicin, doxorubicin), busulfan, cytarabine, paclitaxel, pentostatin, tamoxifen, trastuzumab -certain diuretics such as chlorothiazide, chlorthalidone, hydrochlorothiazide, indapamide, metolazone -certain medicines that treat or prevent blood clots like warfarin -certain muscle relaxants such as succinylcholine -cyclosporine -etanercept -indomethacin -medicines to increase blood counts like filgrastim, pegfilgrastim, sargramostim -medicines used as general anesthesia -metronidazole -natalizumab This list may not describe all possible interactions. Give your health care provider a list of all the medicines, herbs, non-prescription drugs, or dietary supplements you use. Also tell them if you smoke, drink alcohol, or  use illegal drugs. Some items may interact with your medicine. What should I watch  for while using this medicine? Visit your doctor for checks on your progress. This drug may make you feel generally unwell. This is not uncommon, as chemotherapy can affect healthy cells as well as cancer cells. Report any side effects. Continue your course of treatment even though you feel ill unless your doctor tells you to stop. Drink water or other fluids as directed. Urinate often, even at night. In some cases, you may be given additional medicines to help with side effects. Follow all directions for their use. Call your doctor or health care professional for advice if you get a fever, chills or sore throat, or other symptoms of a cold or flu. Do not treat yourself. This drug decreases your body's ability to fight infections. Try to avoid being around people who are sick. This medicine may increase your risk to bruise or bleed. Call your doctor or health care professional if you notice any unusual bleeding. Be careful brushing and flossing your teeth or using a toothpick because you may get an infection or bleed more easily. If you have any dental work done, tell your dentist you are receiving this medicine. You may get drowsy or dizzy. Do not drive, use machinery, or do anything that needs mental alertness until you know how this medicine affects you. Do not become pregnant while taking this medicine or for 1 year after stopping it. Women should inform their doctor if they wish to become pregnant or think they might be pregnant. Men should not father a child while taking this medicine and for 4 months after stopping it. There is a potential for serious side effects to an unborn child. Talk to your health care professional or pharmacist for more information. Do not breast-feed an infant while taking this medicine. This medicine may interfere with the ability to have a child. This medicine has caused ovarian failure in  some women. This medicine has caused reduced sperm counts in some men. You should talk with your doctor or health care professional if you are concerned about your fertility. If you are going to have surgery, tell your doctor or health care professional that you have taken this medicine. What side effects may I notice from receiving this medicine? Side effects that you should report to your doctor or health care professional as soon as possible: -allergic reactions like skin rash, itching or hives, swelling of the face, lips, or tongue -low blood counts - this medicine may decrease the number of white blood cells, red blood cells and platelets. You may be at increased risk for infections and bleeding. -signs of infection - fever or chills, cough, sore throat, pain or difficulty passing urine -signs of decreased platelets or bleeding - bruising, pinpoint red spots on the skin, black, tarry stools, blood in the urine -signs of decreased red blood cells - unusually weak or tired, fainting spells, lightheadedness -breathing problems -dark urine -dizziness -palpitations -swelling of the ankles, feet, hands -trouble passing urine or change in the amount of urine -weight gain -yellowing of the eyes or skin Side effects that usually do not require medical attention (report to your doctor or health care professional if they continue or are bothersome): -changes in nail or skin color -hair loss -missed menstrual periods -mouth sores -nausea, vomiting This list may not describe all possible side effects. Call your doctor for medical advice about side effects. You may report side effects to FDA at 1-800-FDA-1088. Where should I keep my medicine? This drug is given in a hospital  or clinic and will not be stored at home. NOTE: This sheet is a summary. It may not cover all possible information. If you have questions about this medicine, talk to your doctor, pharmacist, or health care provider.  2018  Elsevier/Gold Standard (2011-12-09 16:22:58)  Doxorubicin injection What is this medicine? DOXORUBICIN (dox oh ROO bi sin) is a chemotherapy drug. It is used to treat many kinds of cancer like leukemia, lymphoma, neuroblastoma, sarcoma, and Wilms' tumor. It is also used to treat bladder cancer, breast cancer, lung cancer, ovarian cancer, stomach cancer, and thyroid cancer. This medicine may be used for other purposes; ask your health care provider or pharmacist if you have questions. COMMON BRAND NAME(S): Adriamycin, Adriamycin PFS, Adriamycin RDF, Rubex What should I tell my health care provider before I take this medicine? They need to know if you have any of these conditions: -heart disease -history of low blood counts caused by a medicine -liver disease -recent or ongoing radiation therapy -an unusual or allergic reaction to doxorubicin, other chemotherapy agents, other medicines, foods, dyes, or preservatives -pregnant or trying to get pregnant -breast-feeding How should I use this medicine? This drug is given as an infusion into a vein. It is administered in a hospital or clinic by a specially trained health care professional. If you have pain, swelling, burning or any unusual feeling around the site of your injection, tell your health care professional right away. Talk to your pediatrician regarding the use of this medicine in children. Special care may be needed. Overdosage: If you think you have taken too much of this medicine contact a poison control center or emergency room at once. NOTE: This medicine is only for you. Do not share this medicine with others. What if I miss a dose? It is important not to miss your dose. Call your doctor or health care professional if you are unable to keep an appointment. What may interact with this medicine? This medicine may interact with the following medications: -6-mercaptopurine -paclitaxel -phenytoin -St. John's  Wort -trastuzumab -verapamil This list may not describe all possible interactions. Give your health care provider a list of all the medicines, herbs, non-prescription drugs, or dietary supplements you use. Also tell them if you smoke, drink alcohol, or use illegal drugs. Some items may interact with your medicine. What should I watch for while using this medicine? This drug may make you feel generally unwell. This is not uncommon, as chemotherapy can affect healthy cells as well as cancer cells. Report any side effects. Continue your course of treatment even though you feel ill unless your doctor tells you to stop. There is a maximum amount of this medicine you should receive throughout your life. The amount depends on the medical condition being treated and your overall health. Your doctor will watch how much of this medicine you receive in your lifetime. Tell your doctor if you have taken this medicine before. You may need blood work done while you are taking this medicine. Your urine may turn red for a few days after your dose. This is not blood. If your urine is dark or brown, call your doctor. In some cases, you may be given additional medicines to help with side effects. Follow all directions for their use. Call your doctor or health care professional for advice if you get a fever, chills or sore throat, or other symptoms of a cold or flu. Do not treat yourself. This drug decreases your body's ability to fight infections. Try to avoid  being around people who are sick. This medicine may increase your risk to bruise or bleed. Call your doctor or health care professional if you notice any unusual bleeding. Talk to your doctor about your risk of cancer. You may be more at risk for certain types of cancers if you take this medicine. Do not become pregnant while taking this medicine or for 6 months after stopping it. Women should inform their doctor if they wish to become pregnant or think they might be  pregnant. Men should not father a child while taking this medicine and for 6 months after stopping it. There is a potential for serious side effects to an unborn child. Talk to your health care professional or pharmacist for more information. Do not breast-feed an infant while taking this medicine. This medicine has caused ovarian failure in some women and reduced sperm counts in some men This medicine may interfere with the ability to have a child. Talk with your doctor or health care professional if you are concerned about your fertility. What side effects may I notice from receiving this medicine? Side effects that you should report to your doctor or health care professional as soon as possible: -allergic reactions like skin rash, itching or hives, swelling of the face, lips, or tongue -breathing problems -chest pain -fast or irregular heartbeat -low blood counts - this medicine may decrease the number of white blood cells, red blood cells and platelets. You may be at increased risk for infections and bleeding. -pain, redness, or irritation at site where injected -signs of infection - fever or chills, cough, sore throat, pain or difficulty passing urine -signs of decreased platelets or bleeding - bruising, pinpoint red spots on the skin, black, tarry stools, blood in the urine -swelling of the ankles, feet, hands -tiredness -weakness Side effects that usually do not require medical attention (report to your doctor or health care professional if they continue or are bothersome): -diarrhea -hair loss -mouth sores -nail discoloration or damage -nausea -red colored urine -vomiting This list may not describe all possible side effects. Call your doctor for medical advice about side effects. You may report side effects to FDA at 1-800-FDA-1088. Where should I keep my medicine? This drug is given in a hospital or clinic and will not be stored at home. NOTE: This sheet is a summary. It may not  cover all possible information. If you have questions about this medicine, talk to your doctor, pharmacist, or health care provider.  2018 Elsevier/Gold Standard (2015-03-23 11:28:51)

## 2016-05-20 NOTE — Telephone Encounter (Signed)
Appointments scheduled per 4.13.18 LOS. Patient given AVS report and calendars with future scheduled appointments. °

## 2016-05-21 ENCOUNTER — Ambulatory Visit: Payer: BLUE CROSS/BLUE SHIELD

## 2016-05-23 ENCOUNTER — Ambulatory Visit: Payer: BLUE CROSS/BLUE SHIELD | Admitting: Physical Therapy

## 2016-05-23 ENCOUNTER — Encounter: Payer: Self-pay | Admitting: Physical Therapy

## 2016-05-23 DIAGNOSIS — Z170 Estrogen receptor positive status [ER+]: Secondary | ICD-10-CM | POA: Diagnosis not present

## 2016-05-23 DIAGNOSIS — M25611 Stiffness of right shoulder, not elsewhere classified: Secondary | ICD-10-CM | POA: Diagnosis not present

## 2016-05-23 DIAGNOSIS — M25612 Stiffness of left shoulder, not elsewhere classified: Secondary | ICD-10-CM | POA: Diagnosis not present

## 2016-05-23 DIAGNOSIS — R600 Localized edema: Secondary | ICD-10-CM | POA: Diagnosis not present

## 2016-05-23 DIAGNOSIS — M6281 Muscle weakness (generalized): Secondary | ICD-10-CM | POA: Diagnosis not present

## 2016-05-23 DIAGNOSIS — C50111 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 female breast: Secondary | ICD-10-CM | POA: Diagnosis not present

## 2016-05-23 NOTE — Therapy (Signed)
[place], [place], [address] Phone: [phone]   Fax:  [phone]  Physical Therapy Treatment  Patient Details  Name: Chelsea Salinas MRN: [id number] Date of Birth: Mar 10, 1955 Referring Provider: [name]  Encounter Date: 05/23/2016      PT End of Session - 05/23/16 1525    Visit Number 6   Number of Visits 9   Date for PT Re-Evaluation 06/01/16   PT Start Time 1304   PT Stop Time 1346   PT Time Calculation (min) 42 min   Activity Tolerance Patient tolerated treatment well   Behavior During Therapy [facility] for tasks assessed/performed      Past Medical History:  Diagnosis Date  . Abnormal Pap smear of cervix    --in her 20s had colpo and some type of treatment to cervix.  . Allergy   . Asthma    "all the time, throughout the yr"  . 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 Breast cancer, right breast ([place]) 02/2016  . Eczema   . Family history of breast cancer   . Family history of genetic disease carrier    paternal relatives with a BRCA mutation  . Family history of ovarian cancer   . Hypertension   . Pneumonia    2000ish  . STD (sexually transmitted disease)    HSV II    Past Surgical History:  Procedure Laterality Date  . BREAST BIOPSY Right 04/13/2016   Procedure: Evacuation RIGHT Breast  Hematoma;  Surgeon: [name], MD;  Location: [facility];  Service: General;  Laterality: Right;  . COLONOSCOPY    . DILATION AND CURETTAGE OF UTERUS  2008  . MASTECTOMY Left 04/12/2016   prophylactic total mastectomy  . MASTECTOMY COMPLETE / SIMPLE W/ SENTINEL NODE BIOPSY Right 04/12/2016   axillary sentinel LNB  . MASTECTOMY W/ SENTINEL NODE BIOPSY Bilateral 04/12/2016   Procedure: BILATERAL TOTAL MASTECTOMIES  WITH RIGHT SENTINEL LYMPH NODE BIOPSY;  Surgeon: [name], MD;  Location: [facility];  Service: General;  Laterality: Bilateral;  . PORTACATH  PLACEMENT Right 05/19/2016   Procedure: INSERTION PORT-A-CATH WITH [place];  Surgeon: [name], MD;  Location: [facility];  Service: General;  Laterality: Right;    There were no vitals filed for this visit.      Subjective Assessment - 05/23/16 1306    Subjective I had my port put in on Thursday. I had chemo on Friday. It hasn't been horrible. I have not done much in the way of exercises because they told me not to do much for a week.    Pertinent History Patient was diagnosed on 02/29/16 with right grade 2 invasive ductal carcinoma breast cancer. It measures 1.7 cm and is located in the upper outer quadrant.  It is ER positive, PR negative and HER2 negative with a Ki67 of 20%, pt is waiting to find out if she will need chemotherapy, she does not think she will require any radiation   Patient Stated Goals to resume normal activities as quickly as possible   Currently in Pain? No/denies   Pain Score 0-No pain                         [facility] Adult PT Treatment/Exercise - 05/23/16 0001      Manual Therapy   Manual Therapy Myofascial release   Myofascial Release to bilateral mastectomy scars with focus on right  Manual Lymphatic Drainage (MLD) instructed pt throughout:.  short neck, diaphragmatic breathing. rihgt inguinal nodes and right axillo-inguinal anastamosis, extra time spent on right axillary swelling      Kinesiotix   Edema skin kote applied to left axilla and lateral trunk and 4 pronged fan shape applied from edema at axilla to lateral trunk.                      [facility] Goals - 05/16/16 1302      CC Long Term Goal  #1   Title Pt to independently verbalize lymphedema risk reduction practices   Baseline 05/16/16- reviewed this with pt again today   Time 4   Period Weeks   Status On-going     CC Long Term Goal  #2   Title Pt to demonstrate 4/5 right tricep strength to allow her to return to prior level of function   Baseline 3+/5   Time 4    Period Weeks   Status On-going     CC Long Term Goal  #3   Title Pt to be independent in a home exercise program for continued strengthening and stretching   Time 4   Period Weeks   Status On-going     CC Long Term Goal  #4   Title Pt to report at least a 70 percent improvement in right axillary swelling to improve comfort   Baseline 05/16/16- 75% improvement   Time 4   Period Weeks   Status Achieved     CC Long Term Goal  #5   Title Pt will demonstrate 165 degrees of right shoulder flexion to allow her to reach items overhead   Baseline 151, 05/16/16- 158 degrees   Time 4   Period Weeks   Status On-going            Plan - 05/23/16 1526    Clinical Impression Statement Pt felt confident with St[facility] After Breast Cancer program though she has been unable to practice at home since she had her port placed on Thursday. She wanted to focus on her swelling today. Performed MLD to R lateral trunk and scar mobilization to bilateral mastectomy scar. R scar was more fibrotic than L. Pt should be ready for discharge from skilled PT services at next session.    Rehab Potential Excellent   Clinical Impairments Affecting Rehab Potential none   PT Frequency 2x / week   PT Duration 4 weeks   PT Treatment/Interventions Patient/family education;Therapeutic exercise;Manual techniques;Manual lymph drainage;Scar mobilization;Passive range of motion;Taping;ADLs/Self Care Home Management   PT Next Visit Plan d/c this visit, continue with MLD and scar massage   PT Home Exercise Plan Post op shoulder ROM HEP, self MLD, supine scap series, strength ABC program   Consulted and Agree with Plan of Care Patient      Patient will benefit from skilled therapeutic intervention in order to improve the following deficits and impairments:  Decreased knowledge of precautions, Pain, Impaired UE functional use, Decreased range of motion, Increased edema, Decreased scar mobility, Decreased strength  Visit  Diagnosis: Localized edema     Problem List Patient Active Problem List   Diagnosis Date Noted  . Breast cancer, right ([place]) 04/12/2016  . Genetic testing 04/08/2016  . BRCA1 positive   . Family history of breast cancer   . Family history of ovarian cancer   . Family history of genetic disease carrier   . Breast cancer ([place])   . Malignant  neoplasm of central portion of right breast in female, estrogen receptor positive ([place]) 03/15/2016  . Vitamin D deficiency 05/18/2015  . Hot flashes 05/18/2015  . Hypertension   . Asthma   . Allergy   . Eczema     Allyson Sabal [facility] 05/23/2016, 3:28 PM  [place], [place], [address] Phone: [phone]   Fax:  [phone]  Name: MARKEYA MINCY MRN: [id number] Date of Birth: 10-Sep-1955  Manus Gunning, PT 05/23/16 3:29 PM

## 2016-05-25 ENCOUNTER — Encounter: Payer: Self-pay | Admitting: Physical Therapy

## 2016-05-25 ENCOUNTER — Ambulatory Visit: Payer: BLUE CROSS/BLUE SHIELD | Admitting: Physical Therapy

## 2016-05-25 DIAGNOSIS — Z170 Estrogen receptor positive status [ER+]: Secondary | ICD-10-CM | POA: Diagnosis not present

## 2016-05-25 DIAGNOSIS — C50411 Malignant neoplasm of upper-outer quadrant of right female breast: Secondary | ICD-10-CM | POA: Diagnosis not present

## 2016-05-25 DIAGNOSIS — M25612 Stiffness of left shoulder, not elsewhere classified: Secondary | ICD-10-CM

## 2016-05-25 DIAGNOSIS — M25611 Stiffness of right shoulder, not elsewhere classified: Secondary | ICD-10-CM | POA: Diagnosis not present

## 2016-05-25 DIAGNOSIS — C50111 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 female breast: Secondary | ICD-10-CM | POA: Diagnosis not present

## 2016-05-25 DIAGNOSIS — M6281 Muscle weakness (generalized): Secondary | ICD-10-CM

## 2016-05-25 DIAGNOSIS — Z9012 Acquired absence of left breast and nipple: Secondary | ICD-10-CM | POA: Diagnosis not present

## 2016-05-25 DIAGNOSIS — R600 Localized edema: Secondary | ICD-10-CM | POA: Diagnosis not present

## 2016-05-25 NOTE — Therapy (Signed)
[place], [place], [address] Phone: [phone]   Fax:  [phone]  Physical Therapy Treatment  Patient Details  Name: Chelsea Salinas MRN: [id number] Date of Birth: 07/07/1955 Referring Provider: [name]  Encounter Date: 05/25/2016      PT End of Session - 05/25/16 1441    Visit Number 7   Number of Visits 9   Date for PT Re-Evaluation 06/01/16   PT Start Time 1302   PT Stop Time 1350   PT Time Calculation (min) 48 min   Activity Tolerance Patient tolerated treatment well   Behavior During Therapy [facility] for tasks assessed/performed      Past Medical History:  Diagnosis Date  . Abnormal Pap smear of cervix    --in her 20s had colpo and some type of treatment to cervix.  . Allergy   . Asthma    "all the time, throughout the yr"  . 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 Breast cancer, right breast ([place]) 02/2016  . Eczema   . Family history of breast cancer   . Family history of genetic disease carrier    paternal relatives with a BRCA mutation  . Family history of ovarian cancer   . Hypertension   . Pneumonia    2000ish  . STD (sexually transmitted disease)    HSV II    Past Surgical History:  Procedure Laterality Date  . BREAST BIOPSY Right 04/13/2016   Procedure: Evacuation RIGHT Breast  Hematoma;  Surgeon: [name], MD;  Location: [place];  Service: General;  Laterality: Right;  . COLONOSCOPY    . DILATION AND CURETTAGE OF UTERUS  2008  . MASTECTOMY Left 04/12/2016   prophylactic total mastectomy  . MASTECTOMY COMPLETE / SIMPLE W/ SENTINEL NODE BIOPSY Right 04/12/2016   axillary sentinel LNB  . MASTECTOMY W/ SENTINEL NODE BIOPSY Bilateral 04/12/2016   Procedure: BILATERAL TOTAL MASTECTOMIES  WITH RIGHT SENTINEL LYMPH NODE BIOPSY;  Surgeon: [name], MD;  Location: [place];  Service: General;  Laterality: Bilateral;  . PORTACATH  PLACEMENT Right 05/19/2016   Procedure: INSERTION PORT-A-CATH WITH [place];  Surgeon: [name], MD;  Location: [place];  Service: General;  Laterality: Right;    There were no vitals filed for this visit.      Subjective Assessment - 05/25/16 1305    Subjective My surgeon cleared me to get back to exercise since having my port. Still walking 2-3 miles each day. Still feel ok from chemo so hopefully I'm over that. Today is my last day of PT.   Pertinent History Patient was diagnosed on 02/29/16 with right grade 2 invasive ductal carcinoma breast cancer. It measures 1.7 cm and is located in the upper outer quadrant.  It is ER positive, PR negative and HER2 negative with a Ki67 of 20%, pt is waiting to find out if she will need chemotherapy, she does not think she will require any radiation   Patient Stated Goals to resume normal activities as quickly as possible   Currently in Pain? No/denies            [facility] PT Assessment - 05/25/16 0001      AROM   Right Shoulder Extension 62 Degrees   Right Shoulder Flexion 152 Degrees   Right Shoulder ABduction 178 Degrees   Right Shoulder Internal Rotation 75 Degrees   Right Shoulder External Rotation 90 Degrees   Left Shoulder Extension 53  Degrees   Left Shoulder Flexion 157 Degrees   Left Shoulder ABduction 180 Degrees   Left Shoulder Internal Rotation 75 Degrees   Left Shoulder External Rotation 90 Degrees     Strength   Overall Strength Within functional limits for tasks performed                     [facility] Adult PT Treatment/Exercise - 05/25/16 0001      Manual Therapy   Manual Therapy Myofascial release   Myofascial Release to bilateral mastectomy scars with focus on right   Manual Lymphatic Drainage (MLD) Short neck, diaphragmatic breathing. right inguinal nodes and right axillo-inguinal anastamosis, extra time spent on right axillary swelling    Kinesiotex Edema     Kinesiotix   Edema skin kote applied to left  axilla and lateral trunk and 4 pronged fan shape applied from edema at axilla to lateral trunk.                 PT Education - 05/25/16 1441    Education provided Yes   Education Details Encouraged pt to begin using cocnut oil or a cream she is comfortable with on her mastectomy incisions to loosen scars.   Person(s) Educated Patient   Methods Explanation   Comprehension Verbalized understanding              Breast Clinic Goals - 03/16/16 1705      Patient will be able to verbalize understanding of pertinent lymphedema risk reduction practices relevant to her diagnosis specifically related to skin care.   Time 1   Period Days   Status Achieved     Patient will be able to return demonstrate and/or verbalize understanding of the post-op home exercise program related to regaining shoulder range of motion.   Time 1   Period Days   Status Achieved     Patient will be able to verbalize understanding of the importance of attending the postoperative After Breast Cancer Class for further lymphedema risk reduction education and therapeutic exercise.   Time 1   Period Days   Status Achieved          Long Term Clinic Goals - 05/25/16 1443      CC Long Term Goal  #1   Title Pt to independently verbalize lymphedema risk reduction practices   Time 4   Period Weeks   Status Achieved     CC Long Term Goal  #2   Title Pt to demonstrate 4/5 right tricep strength to allow her to return to prior level of function   Time 4   Period Weeks   Status Achieved     CC Long Term Goal  #3   Title Pt to be independent in a home exercise program for continued strengthening and stretching   Time 4   Period Weeks   Status Achieved     CC Long Term Goal  #4   Title Pt to report at least a 70 percent improvement in right axillary swelling to improve comfort   Time 4   Period Weeks   Status Achieved     CC Long Term Goal  #5   Title Pt will demonstrate 165 degrees of right  shoulder flexion to allow her to reach items overhead   Time 4   Period Weeks   Status Partially Met            Plan - 05/25/16 1442    Clinical Impression Statement  Patient is doing extremely well with shoulder ROM, scar healing, and self care. She just began chemotherapy last week but feels she will do well and be able to keep exercising. She is ready for D/C and agrees to that today.   Rehab Potential Excellent   Clinical Impairments Affecting Rehab Potential none   PT Frequency 2x / week   PT Duration 4 weeks   PT Treatment/Interventions Patient/family education;Therapeutic exercise;Manual techniques;Manual lymph drainage;Scar mobilization;Passive range of motion;Taping;ADLs/Self Care Home Management   PT Next Visit Plan Discharge pt; goals met   Consulted and Agree with Plan of Care Patient      Patient will benefit from skilled therapeutic intervention in order to improve the following deficits and impairments:  Decreased knowledge of precautions, Pain, Impaired UE functional use, Decreased range of motion, Increased edema, Decreased scar mobility, Decreased strength  Visit Diagnosis: Localized edema  Muscle weakness (generalized)  Stiffness of right shoulder, not elsewhere classified  Stiffness of left shoulder, not elsewhere classified  Carcinoma of central portion of right breast in female, estrogen receptor positive ([place])     Problem List Patient Active Problem List   Diagnosis Date Noted  . Breast cancer, right ([place]) 04/12/2016  . Genetic testing 04/08/2016  . BRCA1 positive   . Family history of breast cancer   . Family history of ovarian cancer   . Family history of genetic disease carrier   . Breast cancer ([place])   . 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 breast in female, estrogen receptor positive ([place]) 03/15/2016  . Vitamin D deficiency 05/18/2015  . Hot flashes 05/18/2015  . Hypertension   . Asthma   . Allergy   . Eczema    Annia Friendly, [place] 05/25/16 2:46 PM  [place], [place], [address] Phone: [phone]   Fax:  [phone]  Name: ALISON KUBICKI MRN: [id number] Date of Birth: Nov 04, 1955  PHYSICAL THERAPY DISCHARGE SUMMARY  Visits from Start of Care: 7  Current functional level related to goals / functional outcomes: All goals met except shoulder flexion but she has made good progress and I anticipate she will continue to progress on her own with her returning to yoga.   Remaining deficits: Mild shoulder flexion ROM deficit with 152 degrees (goal was 165 degrees).   Education / Equipment: Home exercise program  Plan: Patient agrees to discharge.  Patient goals were partially met. Patient is being discharged due to meeting the stated rehab goals.  ?????    Only goal not met was related to right shoulder flexion AROM.  Annia Friendly, [place] 05/25/16 2:48 PM

## 2016-05-27 ENCOUNTER — Other Ambulatory Visit (HOSPITAL_BASED_OUTPATIENT_CLINIC_OR_DEPARTMENT_OTHER): Payer: BLUE CROSS/BLUE SHIELD

## 2016-05-27 ENCOUNTER — Telehealth: Payer: Self-pay | Admitting: Nurse Practitioner

## 2016-05-27 ENCOUNTER — Ambulatory Visit: Payer: BLUE CROSS/BLUE SHIELD

## 2016-05-27 ENCOUNTER — Encounter: Payer: Self-pay | Admitting: Hematology

## 2016-05-27 ENCOUNTER — Ambulatory Visit (HOSPITAL_BASED_OUTPATIENT_CLINIC_OR_DEPARTMENT_OTHER): Payer: BLUE CROSS/BLUE SHIELD | Admitting: Nurse Practitioner

## 2016-05-27 VITALS — BP 155/92 | HR 71 | Temp 98.4°F | Resp 16 | Ht 64.5 in | Wt 151.7 lb

## 2016-05-27 DIAGNOSIS — Z1509 Genetic susceptibility to other malignant neoplasm: Secondary | ICD-10-CM | POA: Diagnosis not present

## 2016-05-27 DIAGNOSIS — C50111 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 female breast: Secondary | ICD-10-CM

## 2016-05-27 DIAGNOSIS — Z170 Estrogen receptor positive status [ER+]: Principal | ICD-10-CM

## 2016-05-27 DIAGNOSIS — Z1501 Genetic susceptibility to malignant neoplasm of breast: Secondary | ICD-10-CM

## 2016-05-27 LAB — CBC WITH DIFFERENTIAL/PLATELET
BASO%: 1.4 % (ref 0.0–2.0)
Basophils Absolute: 0 10*3/uL (ref 0.0–0.1)
EOS%: 3.8 % (ref 0.0–7.0)
Eosinophils Absolute: 0.1 10*3/uL (ref 0.0–0.5)
HCT: 33.8 % — ABNORMAL LOW (ref 34.8–46.6)
HEMOGLOBIN: 11.2 g/dL — AB (ref 11.6–15.9)
LYMPH%: 48.6 % (ref 14.0–49.7)
MCH: 32.5 pg (ref 25.1–34.0)
MCHC: 33.1 g/dL (ref 31.5–36.0)
MCV: 98 fL (ref 79.5–101.0)
MONO#: 0.4 10*3/uL (ref 0.1–0.9)
MONO%: 14.3 % — AB (ref 0.0–14.0)
NEUT#: 0.9 10*3/uL — ABNORMAL LOW (ref 1.5–6.5)
NEUT%: 31.9 % — ABNORMAL LOW (ref 38.4–76.8)
Platelets: 150 10*3/uL (ref 145–400)
RBC: 3.45 10*6/uL — AB (ref 3.70–5.45)
RDW: 13.5 % (ref 11.2–14.5)
WBC: 2.8 10*3/uL — ABNORMAL LOW (ref 3.9–10.3)
lymph#: 1.4 10*3/uL (ref 0.9–3.3)

## 2016-05-27 LAB — COMPREHENSIVE METABOLIC PANEL
ALBUMIN: 3.8 g/dL (ref 3.5–5.0)
ALK PHOS: 115 U/L (ref 40–150)
ALT: 46 U/L (ref 0–55)
AST: 31 U/L (ref 5–34)
Anion Gap: 10 mEq/L (ref 3–11)
BILIRUBIN TOTAL: 0.22 mg/dL (ref 0.20–1.20)
BUN: 13.1 mg/dL (ref 7.0–26.0)
CO2: 27 mEq/L (ref 22–29)
Calcium: 9.6 mg/dL (ref 8.4–10.4)
Chloride: 103 mEq/L (ref 98–109)
Creatinine: 0.6 mg/dL (ref 0.6–1.1)
EGFR: >90 mL/min/{1.73_m2} (ref >90)
GLUCOSE: 101 mg/dL (ref 70–140)
POTASSIUM: 3.9 meq/L (ref 3.5–5.1)
SODIUM: 139 meq/L (ref 136–145)
TOTAL PROTEIN: 6.7 g/dL (ref 6.4–8.3)

## 2016-05-27 NOTE — Progress Notes (Signed)
[facility] OFFICE PROGRESS NOTE   Diagnosis:  Breast cancer Oncology History   Cancer Staging 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 breast in female, estrogen receptor positive ([place]) Staging form: Breast, AJCC 8th Edition - Clinical stage from 03/08/2016: Stage IA (cT1c, cN0, cM0, G2, ER: Positive, PR: Negative, HER2: Negative) - Signed by Truitt Merle, MD on 03/15/2016 - Pathologic stage from 04/12/2016: Stage IA (pT1c, pN0, cM0, G2, ER: Positive, PR: Negative, HER2: Negative, Oncotype DX score: 49) - Signed by Truitt Merle, MD on 05/05/2016       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 breast in female, estrogen receptor positive ([place])   03/04/2016 Mammogram    Diagnostic bilateral mammogram and right breast ultrasound showed a 1.7 cm mass in the 12:00 position of the right breast, highly suspicious for malignancy. There is a borderline abnormal right axillary lymph node, also suspicious for metastasis.       03/08/2016 Initial Diagnosis    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 breast in female, estrogen receptor positive ([place])      03/08/2016 Initial Biopsy    Right breast 12:00 core needle biopsy showed invasive ductal carcinoma, grade 2, right axillary lymph node biopsy was negative.      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  Genes Analyzed: 43 genes on [facility]'s Common Cancers panel (APC, ATM, AXIN2, BARD1, BMPR1A, BRCA1, BRCA2, BRIP1, CDH1, CDKN2A, CHEK2, DICER1, EPCAM, GREM1, HOXB13, KIT, MEN1, MLH1, MSH2, MSH6, MUTYH, NBN, NF1, PALB2, PDGFRA, PMS2, POLD1, POLE, PTEN, RAD50, RAD51C, RAD51D, SDHA, SDHB, SDHC, SDHD, SMAD4, SMARCA4, STK11, TP53, TSC1, TSC2, VHL).       04/12/2016 Surgery    Bilateral mastectomy and right sentinel lymph node biopsy, by [name]       04/12/2016 Pathology Results    Right breast mastectomy showed invasive grade 2 invasive ductal carcinoma, 1.7 cm, margins were negative, 3 sentinel lymph nodes were negative, left simple mastectomy fibroadenoma, one benign node, no atypia or tumor.       05/04/2016 Oncotype testing    Recurrence score 49, high-risk, predicts 10 year risk of distant recurrence 32% with tamoxifen alone.      05/10/2016 Echocardiogram    Echo on 05/10/16 showed EF 55-60%.      05/20/2016 -  Chemotherapy    dose dense Adriamycin and Cytoxan, every 2 weeks, for 4 cycles, followed by Taxol  (weekly for 12 weeks or biweekly for 4 cycles) starting 05/20/16       INTERVAL HISTORY:   [name] returns as scheduled. She completed cycle 1 adriamycin/cytoxan on 05/20/2016. She did not feel well for a few days after treatment. Appetite was diminished. On day 3 she had mild nausea. She feels well at present. Appetite has returned. No mouth sores. No diarrhea. Mild constipation for a few days following chemotherapy.   Objective:  Vital signs in last 24 hours:  Blood pressure (!) 155/92, pulse 71, temperature 98.4 F (36.9 C), temperature source Oral, resp. rate 16, height 5' 4.5" (1.638 m), weight 151 lb 11.2 oz (68.8 kg), last menstrual period 02/07/1998, SpO2 100 %.    HEENT: no thrush or ulcers. Resp: lungs clear bilaterally. Cardio: regular rate and rhythm. GI: abdomen soft and nontender. No hepatomegaly. Vascular: no leg edema. Calves soft and nontender. Neuro: alert and oriented.  Skin: no rash. Breast: status post bilateral mastectomy; well healed; no erythema. Portacath without erythema.   Lab Results:  Lab Results  Component Value Date   WBC 2.8 (L) 05/27/2016   HGB 11.2 (L) 05/27/2016   HCT 33.8 (L) 05/27/2016   MCV 98.0 05/27/2016   PLT 150 05/27/2016   NEUTROABS 0.9 (L) 05/27/2016    Imaging:  No results found.  Medications: I have reviewed the patient's current  medications.  Assessment/Plan: 1. 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 Oncotype RS 49, high risk; status post cycle 1 Adriamycin/Cytoxan 05/20/2016. 2. BRCA1 mutation (+) -Due to her strong family history of ovarian cancer and also positive family history of breast cancer, she underwent genetic testing - Patient is positive for BRCA1 Mutation -Dr. Burr Medico discussed the high risk of breast cancer and colon cancer due to the BRCA1 mutation. She is status post bilateral mastectomy. She is agreeable to have BSO, she has discussed with her gynecologist. Dr. Burr Medico recommends her to have this surgery done after she completes adjuvant chemotherapy. - Her sister has done genetic testing, results pending -She has no children. 3. History of right chest wall cellulitis, resolved.   Disposition: Chelsea Salinas appears stable. She completed cycle 1 AC on 05/20/2016. Overall she tolerated the chemotherapy well.   We reviewed the CBC from today. Neutropenic precautions were discussed. She understands to contact the office with fever, chills, other signs of infection.   She will return for a follow-up visit and cycle 2 AC on 06/02/2016. She will contact the office in the interim as outlined above or with any other problems.     Ned Card ANP/GNP-BC   05/27/2016  2:29 PM

## 2016-05-27 NOTE — Progress Notes (Signed)
Met w/ pt to discuss copay assistance.  She gave me consent to apply in her behalf so I applied online w/ the [employer] but she was denied because she exceeded the income requirement.  I did enroll her in the [employer] First [employer] program, faxed signed form and activated card today.  Pt is also overqualified for the [employer]..  She has my card for any questions or concerns she may have in the future.

## 2016-05-27 NOTE — Telephone Encounter (Signed)
Appointments scheduled per 4.20.18 LOS. Patient given AVS report and calendars with future scheduled appointments. °

## 2016-05-30 ENCOUNTER — Ambulatory Visit: Payer: BLUE CROSS/BLUE SHIELD | Admitting: Physical Therapy

## 2016-06-01 ENCOUNTER — Ambulatory Visit: Payer: BLUE CROSS/BLUE SHIELD

## 2016-06-01 NOTE — Progress Notes (Signed)
[name]  [phone]) [phone] [phone]) [phone]  Clinic Follow Up Note   Patient Care Team: [name], MD as PCP - General (Internal Medicine) Renella Cunas, MD (Inactive) as Consulting Physician (Cardiology) [name], MD as Consulting Physician (Dermatology) 06/02/2016    CHIEF COMPLAINTS:  Follow up right breast cancer  Oncology History   Cancer Staging 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 breast in female, estrogen receptor positive (Chelsea Salinas) Staging form: Breast, AJCC 8th Edition - Clinical stage from 03/08/2016: Stage IA (cT1c, cN0, cM0, G2, ER: Positive, PR: Negative, HER2: Negative) - Signed by Truitt Merle, MD on 03/15/2016 - Pathologic stage from 04/12/2016: Stage IA (pT1c, pN0, cM0, G2, ER: Positive, PR: Negative, HER2: Negative, Oncotype DX score: 49) - Signed by Truitt Merle, MD on 05/05/2016       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 breast in female, estrogen receptor positive (Chelsea Salinas)   03/04/2016 Mammogram    Diagnostic bilateral mammogram and right breast ultrasound showed a 1.7 cm mass in the 12:00 position of the right breast, highly suspicious for malignancy. There is a borderline abnormal right axillary lymph node, also suspicious for metastasis.       03/08/2016 Initial Diagnosis    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 breast in female, estrogen receptor positive (Chelsea Salinas)      03/08/2016 Initial Biopsy    Right breast 12:00 core needle biopsy showed invasive ductal carcinoma, grade 2, right axillary lymph node biopsy was negative.      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  Genes Analyzed: 43 genes on [facility]'s Common Cancers panel (APC, ATM, AXIN2, BARD1, BMPR1A, BRCA1, BRCA2, BRIP1, CDH1, CDKN2A, CHEK2, DICER1, EPCAM, GREM1, HOXB13, KIT, MEN1, MLH1, MSH2, MSH6, MUTYH,  NBN, NF1, PALB2, PDGFRA, PMS2, POLD1, POLE, PTEN, RAD50, RAD51C, RAD51D, SDHA, SDHB, SDHC, SDHD, SMAD4, SMARCA4, STK11, TP53, TSC1, TSC2, VHL).       04/12/2016 Surgery    Bilateral mastectomy and right sentinel lymph node biopsy, by [name]      04/12/2016 Pathology Results    Right breast mastectomy showed invasive grade 2 invasive ductal carcinoma, 1.7 cm, margins were negative, 3 sentinel lymph nodes were negative, left simple mastectomy fibroadenoma, one benign node, no atypia or tumor.       05/04/2016 Oncotype testing    Recurrence score 49, high-risk, predicts 10 year risk of distant recurrence 32% with tamoxifen alone.      05/10/2016 Echocardiogram    Echo on 05/10/16 showed EF 55-60%.      05/20/2016 -  Chemotherapy    dose dense Adriamycin and Cytoxan, every 2 weeks, for 4 cycles, followed by Taxol  (weekly for 12 weeks or biweekly for 4 cycles) starting 05/20/16       HISTORY OF PRESENTING ILLNESS (03/16/16):  Chelsea Salinas 61 y.o. female is here because of recently diagnosed right breast invasive mammary carcinoma. She is accompanied by her husband to our multidisciplinary breast clinic today.  The patient underwent routine screening mammogram on 02/29/2016 and was found to have possible architectural distortion and asymmetry in the right breast. Also found was asymmetry in the left breast.  Accordingly a bilateral diagnostic mammogram and unilateral right breast ultrasound were performed on 03/04/2016. These scans showed a 1.7 cm mass in the 12 o'clock position of the right breast with  imaging features highly suspicious for malignancy. Also seen was borderline abnormal right axillary lymph node, suspicious for a metastatic node.  Biopsy of the right breast 12:00 o'clock position on 03/08/2016 revealed invasive mammary carcinoma. The carcinoma appears to be at least grade 2, ER positive, PR negative, HER-2 negative, Ki67 20%. The malignant cells are positive for E-Cadherin,  supporting a ductal phenotype. Biopsy of the right axillary lymph node showed no evidence of carcinoma.   The patient reports to the clinic today for follow up and to discuss chemotherapy. She has had surgery about 3 weeks ago and she has been doing well. She started antibiotics yesterday to help with infection that she has recently gotten. She has also started doing PT this week. She decided not to do reconstruction. She has been eating well.    GYN HISTORY  Menarchal:12 LMP: late 71's (hot flushes since mid 60's)  Contraceptive: 23 HRT: 3-4 years  G0P0:  CURRENT THERAPY: dose dense Adriamycin and Cytoxan, every 2 weeks, for 4 cycles, followed by Taxol  (weekly for 12 weeks or biweekly for 4 cycles) starting 05/20/16  INTERVAL HISTORY: Chelsea Salinas returns for follow up and chemo. She presents with her friend and reports that she experienced tiredness and fair appetite and slight nausea. She asked about any supplments. Her Incision recovered well and she reports she occasionally went to physical therapy. She reports she is active with yoga and walking.Her skin is dry on hands. She denies oral sores. Se had not been checking her BP but will start doing so. She takes Zofran at 100 mg and previously she was taking Benecar. She reports having salt in her diet.   MEDICAL HISTORY:  Past Medical History:  Diagnosis Date  . Abnormal Pap smear of cervix    --in her 20s had colpo and some type of treatment to cervix.  . Allergy   . Asthma    "all the time, throughout the yr"  . 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 Breast cancer, right breast ([place]) 02/2016  . Eczema   . Family history of breast cancer   . Family history of genetic disease carrier    paternal relatives with a BRCA mutation  . Family history of ovarian cancer   . Hypertension   . Pneumonia    2000ish  . STD (sexually transmitted disease)    HSV II    SURGICAL HISTORY: Past Surgical  History:  Procedure Laterality Date  . BREAST BIOPSY Right 04/13/2016   Procedure: Evacuation RIGHT Breast  Hematoma;  Surgeon: [name], MD;  Location: [facility];  Service: General;  Laterality: Right;  . COLONOSCOPY    . DILATION AND CURETTAGE OF UTERUS  2008  . MASTECTOMY Left 04/12/2016   prophylactic total mastectomy  . MASTECTOMY COMPLETE / SIMPLE W/ SENTINEL NODE BIOPSY Right 04/12/2016   axillary sentinel LNB  . MASTECTOMY W/ SENTINEL NODE BIOPSY Bilateral 04/12/2016   Procedure: BILATERAL TOTAL MASTECTOMIES  WITH RIGHT SENTINEL LYMPH NODE BIOPSY;  Surgeon: [name], MD;  Location: [facility];  Service: General;  Laterality: Bilateral;  . PORTACATH PLACEMENT Right 05/19/2016   Procedure: INSERTION PORT-A-CATH WITH [place];  Surgeon: [name], MD;  Location: [facility];  Service: General;  Laterality: Right;    SOCIAL HISTORY: Social History   Social History  . Marital status: Married    Spouse name: N/A  . Number of children: N/A  . Years of education: N/A   Occupational  History  . Not on file.   Social History Main Topics  . Smoking status: Former Smoker    Packs/day: 1.00    Years: 25.00    Quit date: 02/08/1996  . Smokeless tobacco: Never Used  . Alcohol use 13.2 oz/week    15 Standard drinks or equivalent, 7 Glasses of wine per week  . Drug use: No  . Sexual activity: No   Other Topics Concern  . Not on file   Social History Narrative  . No narrative on file    FAMILY HISTORY: Family History  Problem Relation Age of Onset  . Polymyalgia rheumatica Mother   . Hypertension Father   . Heart disease Father     Dec 69  . Heart attack Father   . Prostate cancer Father     Dx 45s; deceased 44  . Breast cancer Cousin     Dx 34s; daughter of a paternal uncle  . Ovarian cancer Paternal Aunt     Dx 74; deceased  . Ovarian cancer Cousin     Dx 42s; daughter of a paternal uncle  . Ovarian cancer Cousin     Dx 44; daughter of a paternal uncle  . Ovarian  cancer Paternal Aunt     Dx 81s.  BRCA positive  . Ovarian cancer Other     pat grandfather's mother    ALLERGIES:  has No Known Allergies.  MEDICATIONS:  Current Outpatient Prescriptions  Medication Sig Dispense Refill  . acetaminophen (TYLENOL) 500 MG tablet Take 500-1,000 mg by mouth every 6 (six) hours as needed for mild pain or headache (depends on pain if takes 1-2 tablets).    Marland Kitchen aspirin EC 81 MG tablet Take 81 mg by mouth daily.    . cetirizine (ZYRTEC) 10 MG tablet Take 10 mg by mouth at bedtime.     . Cholecalciferol (VITAMIN D) 2000 units tablet Take 2,000 Units by mouth daily.    . cloNIDine (CATAPRES) 0.2 MG tablet Take 1 tablet by mouth at bedtime.   0  . escitalopram (LEXAPRO) 10 MG tablet Take 1 tablet by mouth daily at 12 noon.   0  . Flaxseed, Linseed, (FLAXSEED OIL PO) Take 1,400 mg by mouth every evening.    . fluticasone (FLONASE ALLERGY RELIEF) 50 MCG/ACT nasal spray Place 1 spray into both nostrils daily.    Marland Kitchen lidocaine-prilocaine (EMLA) cream Apply 1 application topically as needed. 30 g 3  . losartan (COZAAR) 100 MG tablet Take 1 tablet (100 mg total) by mouth daily. 30 tablet 11  . magnesium oxide (MAG-OX) 400 MG tablet Take 400 mg by mouth every other day.    . montelukast (SINGULAIR) 10 MG tablet TAKE 1 TABLET(10 MG) BY MOUTH DAILY (Patient taking differently: TAKE 1 TABLET BY MOUTH AS NEEDED FOR SHORTNESS OF BREATH) 90 tablet 0  . Multiple Vitamins-Minerals (MULTIVITAMIN WITH MINERALS) tablet Take 1 tablet by mouth every evening.     . Omega-3 Fatty Acids ([employer] FISH OIL) 1200 MG CAPS Take 1,200 mg by mouth every evening.     . ondansetron (ZOFRAN) 8 MG tablet Take 1 tablet (8 mg total) by mouth 2 (two) times daily as needed. Start on the third day after chemotherapy. 30 tablet 1  . prochlorperazine (COMPAZINE) 10 MG tablet Take 1 tablet (10 mg total) by mouth every 6 (six) hours as needed (Nausea or vomiting). 30 tablet 1  . valACYclovir (VALTREX) 500 MG tablet  Take 1 tablet (500 mg total) by mouth  2 (two) times daily. Take for 3 days as needed. 30 tablet 1  . venlafaxine XR (EFFEXOR-XR) 75 MG 24 hr capsule TAKE 2 CAPSULES BY MOUTH EVERY MORNING AND 1 CAPSULE EVERY EVENING 270 capsule 0  . vitamin B-12 (CYANOCOBALAMIN) 100 MCG tablet Take 100 mcg by mouth every other day.      No current facility-administered medications for this visit.     REVIEW OF SYSTEMS:   Constitutional: Denies fevers, chills or abnormal night sweats Eyes: Denies blurriness of vision, double vision or watery eyes Ears, nose, mouth, throat, and face: Denies mucositis or sore throat Respiratory: Denies cough, dyspnea or wheezes Cardiovascular: Denies palpitation, chest discomfort or lower extremity swelling Gastrointestinal:  Denies nausea, heartburn or change in bowel habits Skin: No skin rashes or lesions.(+) dryness of hands Lymphatics: Denies new lymphadenopathy or easy bruising Neurological:Denies numbness, tingling or new weaknesses Behavioral/Psych: Mood is stable, no new changes  All other systems were reviewed with the patient and are negative.  PHYSICAL EXAMINATION:  ECOG PERFORMANCE STATUS: 0 - Asymptomatic  Vitals:   06/02/16 0853  BP: (!) 156/93  Pulse: 79  Resp: 17  Temp: 98.2 F (36.8 C)   Filed Weights   06/02/16 0853  Weight: 152 lb 6.4 oz (69.1 kg)    GENERAL:alert, no distress and comfortable SKIN: skin color, texture, turgor are normal, no rashes or significant lesions, Dryness pof skin and color changes to nail nail bed. EYES: normal, conjunctiva are pink and non-injected, sclera clear OROPHARYNX:no exudate, no erythema and lips, buccal mucosa, and tongue normal, no oral thrush NECK: supple, thyroid normal size, non-tender, without nodularity LYMPH:  no palpable lymphadenopathy in the cervical, axillary or inguinal LUNGS: clear to auscultation and percussion with normal breathing effort HEART: regular rate & rhythm and no murmurs and no  lower extremity edema ABDOMEN:abdomen soft, non-tender and normal bowel sounds Musculoskeletal:no cyanosis of digits and no clubbing  PSYCH: alert & oriented x 3 with fluent speech NEURO: no focal motor/sensory deficits Breasts: Breast inspection showed Status post bilateral mastectomy, surgical incisions have healed well with no discharge or surrounding skin Erythema. She has residual mild skin erythema on the right side chest wall, and mild soft tissue fullness at his right axilla secondary to surgery.  LABORATORY DATA:  I have reviewed the data as listed CBC Latest Ref Rng & Units 06/02/2016 05/27/2016 05/20/2016  WBC 3.9 - 10.3 10e3/uL 14.4(H) 2.8(L) 4.4  Hemoglobin 11.6 - 15.9 g/dL 10.8(L) 11.2(L) 11.8  Hematocrit 34.8 - 46.6 % 32.3(L) 33.8(L) 35.1  Platelets 145 - 400 10e3/uL 191 150 279    CMP Latest Ref Rng & Units 06/02/2016 05/27/2016 05/20/2016  Glucose 70 - 140 mg/dl 97 101 105  BUN 7.0 - 26.0 mg/dL 9.9 13.1 12.4  Creatinine 0.6 - 1.1 mg/dL 0.7 0.6 0.7  Sodium 136 - 145 mEq/L 145 139 143  Potassium 3.5 - 5.1 mEq/L 4.3 3.9 4.1  Chloride 101 - 111 mmol/L - - -  CO2 22 - 29 mEq/L 27 27 28   Calcium 8.4 - 10.4 mg/dL 9.4 9.6 9.3  Total Protein 6.4 - 8.3 g/dL 6.6 6.7 6.5  Total Bilirubin 0.20 - 1.20 mg/dL <0.22 0.22 0.25  Alkaline Phos 40 - 150 U/L 129 115 91  AST 5 - 34 U/L 21 31 17   ALT 0 - 55 U/L 22 46 13    PATHOLOGY  Diagnosis 04/12/16 1. Breast, simple mastectomy, Left Prophylactic Total - BREAST PARENCHYMA SHOWING FIBROCYSTIC CHANGES WITH APOCRINE METAPLASIA AND FLORID  USUAL DUCTAL HYPERPLASIA. - FIBROADENOMA. - ONE BENIGN LYMPH NODE (0/1). - NO ATYPIA OR TUMOR SEEN. - GROSSLY UNREMARKABLE SKIN. 2. Breast, simple mastectomy, Right Total - INVASIVE, GRADE 2 DUCTAL CARCINOMA, SPANNING 1.7 CM IN GREATEST DIMENSION. - MARGINS ARE NEGATIVE. - GROSSLY UNREMARKABLE SKIN. - SEE ONCOLOGY TEMPLATE. 3. Lymph node, sentinel, biopsy, Right Axillary #1 - ONE BENIGN WITH NO TUMOR  SEEN (0/1). - SEE COMMENT. 4. Lymph node, sentinel, biopsy, Right - ONE BENIGN WITH NO TUMOR SEEN (0/1). - SEE COMMENT. 5. Lymph node, sentinel, biopsy, Right - ONE BENIGN WITH NO TUMOR SEEN (0/1). - SEE COMMENT.  Diagnosis 03/08/2016 1. Breast, right, needle core biopsy, 12:00 o'clock - INVASIVE MAMMARY CARCINOMA. - SEE COMMENT. 2. Lymph node, needle/core biopsy, right axilla - THERE IS NO EVIDENCE OF CARCINOMA IN 1 OF 1 LYMPH NODE (0/1).   RADIOGRAPHIC STUDIES: I have personally reviewed the radiological images as listed and agreed with the findings in the report. Dg Chest Port 1 View  Result Date: 05/19/2016 CLINICAL DATA:  Port-A-Cath placement EXAM: PORTABLE CHEST 1 VIEW COMPARISON:  03/11/2008 FINDINGS: Right internal jugular power port tip in the SVC just below the azygos level. No pneumothorax. Lungs remain clear. Heart size is normal. IMPRESSION: Right internal jugular power port placement. Tip in the SVC just below the azygos level, 5 cm above the right atrium. No complication. Electronically Signed   By: Nelson Chimes M.D.   On: 05/19/2016 09:20   Dg Fluoro Guide Cv Line-no Report  Result Date: 05/19/2016 Fluoroscopy was utilized by the requesting physician.  No radiographic interpretation.     ECHO 05/10/16 Study Conclusions - Left ventricle: The cavity size was normal. Systolic function was   normal. The estimated ejection fraction was in the range of 55%   to 60%. Wall motion was normal; there were no regional wall   motion abnormalities. Doppler parameters are consistent with   abnormal left ventricular relaxation (grade 1 diastolic   dysfunction). There was no evidence of elevated ventricular   filling pressure by Doppler parameters. - Aortic valve: Trileaflet; normal thickness leaflets. There was no   regurgitation. - Aortic root: The aortic root was normal in size. - Mitral valve: Structurally normal valve. There was no   regurgitation. - Right ventricle: The  cavity size was normal. Wall thickness was   normal. Systolic function was normal. - Tricuspid valve: There was trivial regurgitation. - Pulmonary arteries: Systolic pressure was within the normal   range. - Inferior vena cava: The vessel was normal in size. - Pericardium, extracardiac: There was no pericardial effusion.   ASSESSMENT & PLAN:  61 y.o. postmenopausal woman, presented with screening discovered right breast cancer  1. 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 Oncotype RS 49, high risk  - Patient has had bilateral total mastectomy on 04/12/16 and denied reconstruction.  -I previously reviewed her surgical pathology findings with patient and her husband in details. Her surgical margins were negative, 3 sentinel lymph nodes were also negative. - Her oncotype is back at 49. This is fairly high risk disease based on the RS. the estimated 10 year risk of distant recurrence with tamoxifen alone is 32%. I strongly recommend chemotherapy to reduce her risk of cancer recurrence after surgery (by ~50%). - Baseline Echo on 05/10/16 showed EF 55-60%. -she has been tolerating chemo well -lab reviewed, adequate for treatment. She will continue cycle adjuvant chemotherapy  with Adriamycin and Cytoxan today with Onpro - Next Cycle is 06/16/16 and will get labs and Flush on this day as well; again on 06/30/16   2. BRCA1 mutation (+) -Due to her strong family history of ovarian cancer and also positive family history of breast cancer, she underwent genetic testing - Patient is positive for BRCA1 Mutation -We previously discussed the high risk of breast cancer and colon cancer due to the BRCA1 mutation. She is status post bilateral mastectomy. She is agreeable to have BSO, she has discussed with her gynecologist. I have recommended her to have this surgery done after she completes adjuvant  chemotherapy. - Her sister has done genetic testing, results pending -She has no children.  3. 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 Labs reviewed, adequate for treatment -Follow up with flush and labs on 5/10 and 5/24   No orders of the defined types were placed in this encounter.   All questions were answered. The patient knows to call the clinic with any problems, questions or concerns. I spent 15 minutes counseling the patient face to face. The total time spent in the appointment was 20 minutes and more than 50% was on counseling.  This document serves as a record of services personally performed by Truitt Merle, MD. It was created on her behalf by Maryla Morrow, a trained medical scribe. The creation of this record is based on the scribe's personal observations and the provider's statements to them. This document has been checked and approved by the attending provider.   Truitt Merle, MD 06/02/2016

## 2016-06-02 ENCOUNTER — Ambulatory Visit (HOSPITAL_BASED_OUTPATIENT_CLINIC_OR_DEPARTMENT_OTHER): Payer: BLUE CROSS/BLUE SHIELD

## 2016-06-02 ENCOUNTER — Encounter: Payer: Self-pay | Admitting: *Deleted

## 2016-06-02 ENCOUNTER — Ambulatory Visit: Payer: BLUE CROSS/BLUE SHIELD

## 2016-06-02 ENCOUNTER — Telehealth: Payer: Self-pay | Admitting: Hematology

## 2016-06-02 ENCOUNTER — Ambulatory Visit (HOSPITAL_BASED_OUTPATIENT_CLINIC_OR_DEPARTMENT_OTHER): Payer: BLUE CROSS/BLUE SHIELD | Admitting: Hematology

## 2016-06-02 ENCOUNTER — Other Ambulatory Visit (HOSPITAL_BASED_OUTPATIENT_CLINIC_OR_DEPARTMENT_OTHER): Payer: BLUE CROSS/BLUE SHIELD

## 2016-06-02 VITALS — BP 156/93 | HR 79 | Temp 98.2°F | Resp 17 | Ht 64.5 in | Wt 152.4 lb

## 2016-06-02 DIAGNOSIS — Z5111 Encounter for antineoplastic chemotherapy: Secondary | ICD-10-CM

## 2016-06-02 DIAGNOSIS — Z170 Estrogen receptor positive status [ER+]: Secondary | ICD-10-CM

## 2016-06-02 DIAGNOSIS — C50111 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 female breast: Secondary | ICD-10-CM

## 2016-06-02 DIAGNOSIS — I10 Essential (primary) hypertension: Secondary | ICD-10-CM

## 2016-06-02 DIAGNOSIS — Z1501 Genetic susceptibility to malignant neoplasm of breast: Secondary | ICD-10-CM

## 2016-06-02 DIAGNOSIS — Z1509 Genetic susceptibility to other malignant neoplasm: Secondary | ICD-10-CM

## 2016-06-02 DIAGNOSIS — D630 Anemia in neoplastic disease: Secondary | ICD-10-CM

## 2016-06-02 LAB — CBC WITH DIFFERENTIAL/PLATELET
BASO%: 1 % (ref 0.0–2.0)
BASOS ABS: 0.1 10*3/uL (ref 0.0–0.1)
EOS ABS: 0 10*3/uL (ref 0.0–0.5)
EOS%: 0.2 % (ref 0.0–7.0)
HCT: 32.3 % — ABNORMAL LOW (ref 34.8–46.6)
HEMOGLOBIN: 10.8 g/dL — AB (ref 11.6–15.9)
LYMPH%: 18.1 % (ref 14.0–49.7)
MCH: 32.9 pg (ref 25.1–34.0)
MCHC: 33.5 g/dL (ref 31.5–36.0)
MCV: 98.1 fL (ref 79.5–101.0)
MONO#: 1.1 10*3/uL — AB (ref 0.1–0.9)
MONO%: 7.4 % (ref 0.0–14.0)
NEUT#: 10.6 10*3/uL — ABNORMAL HIGH (ref 1.5–6.5)
NEUT%: 73.3 % (ref 38.4–76.8)
PLATELETS: 191 10*3/uL (ref 145–400)
RBC: 3.29 10*6/uL — ABNORMAL LOW (ref 3.70–5.45)
RDW: 14.2 % (ref 11.2–14.5)
WBC: 14.4 10*3/uL — ABNORMAL HIGH (ref 3.9–10.3)
lymph#: 2.6 10*3/uL (ref 0.9–3.3)

## 2016-06-02 LAB — COMPREHENSIVE METABOLIC PANEL
ALBUMIN: 3.9 g/dL (ref 3.5–5.0)
ALK PHOS: 129 U/L (ref 40–150)
ALT: 22 U/L (ref 0–55)
AST: 21 U/L (ref 5–34)
Anion Gap: 11 mEq/L (ref 3–11)
BUN: 9.9 mg/dL (ref 7.0–26.0)
CALCIUM: 9.4 mg/dL (ref 8.4–10.4)
CO2: 27 mEq/L (ref 22–29)
Chloride: 107 mEq/L (ref 98–109)
Creatinine: 0.7 mg/dL (ref 0.6–1.1)
GLUCOSE: 97 mg/dL (ref 70–140)
Potassium: 4.3 mEq/L (ref 3.5–5.1)
SODIUM: 145 meq/L (ref 136–145)
TOTAL PROTEIN: 6.6 g/dL (ref 6.4–8.3)

## 2016-06-02 MED ORDER — SODIUM CHLORIDE 0.9% FLUSH
10.0000 mL | INTRAVENOUS | Status: DC | PRN
  Administered 2016-06-02: 10 mL
  Filled 2016-06-02: qty 10

## 2016-06-02 MED ORDER — DOXORUBICIN HCL CHEMO IV INJECTION 2 MG/ML
60.0000 mg/m2 | Freq: Once | INTRAVENOUS | Status: AC
  Administered 2016-06-02: 106 mg via INTRAVENOUS
  Filled 2016-06-02: qty 53

## 2016-06-02 MED ORDER — HEPARIN SOD (PORK) LOCK FLUSH 100 UNIT/ML IV SOLN
500.0000 [IU] | Freq: Once | INTRAVENOUS | Status: AC | PRN
  Administered 2016-06-02: 500 [IU]
  Filled 2016-06-02: qty 5

## 2016-06-02 MED ORDER — SODIUM CHLORIDE 0.9 % IV SOLN
Freq: Once | INTRAVENOUS | Status: AC
  Administered 2016-06-02: 10:00:00 via INTRAVENOUS
  Filled 2016-06-02: qty 5

## 2016-06-02 MED ORDER — PALONOSETRON HCL INJECTION 0.25 MG/5ML
INTRAVENOUS | Status: AC
  Filled 2016-06-02: qty 5

## 2016-06-02 MED ORDER — PEGFILGRASTIM 6 MG/0.6ML ~~LOC~~ PSKT
6.0000 mg | PREFILLED_SYRINGE | Freq: Once | SUBCUTANEOUS | Status: AC
  Administered 2016-06-02: 6 mg via SUBCUTANEOUS
  Filled 2016-06-02: qty 0.6

## 2016-06-02 MED ORDER — PALONOSETRON HCL INJECTION 0.25 MG/5ML
0.2500 mg | Freq: Once | INTRAVENOUS | Status: AC
  Administered 2016-06-02: 0.25 mg via INTRAVENOUS

## 2016-06-02 MED ORDER — SODIUM CHLORIDE 0.9 % IV SOLN
600.0000 mg/m2 | Freq: Once | INTRAVENOUS | Status: AC
  Administered 2016-06-02: 1060 mg via INTRAVENOUS
  Filled 2016-06-02: qty 53

## 2016-06-02 MED ORDER — SODIUM CHLORIDE 0.9 % IV SOLN
Freq: Once | INTRAVENOUS | Status: AC
  Administered 2016-06-02: 10:00:00 via INTRAVENOUS

## 2016-06-02 NOTE — Patient Instructions (Signed)
[facility] Discharge Instructions for Patients Receiving Chemotherapy  Today you received the following chemotherapy agents:Adriamycin and Cytoxan   To help prevent nausea and vomiting after your treatment, we encourage you to take your nausea medication as directed.    If you develop nausea and vomiting that is not controlled by your nausea medication, call the clinic.   BELOW ARE SYMPTOMS THAT SHOULD BE REPORTED IMMEDIATELY:  *FEVER GREATER THAN 100.5 F  *CHILLS WITH OR WITHOUT FEVER  NAUSEA AND VOMITING THAT IS NOT CONTROLLED WITH YOUR NAUSEA MEDICATION  *UNUSUAL SHORTNESS OF BREATH  *UNUSUAL BRUISING OR BLEEDING  TENDERNESS IN MOUTH AND THROAT WITH OR WITHOUT PRESENCE OF ULCERS  *URINARY PROBLEMS  *BOWEL PROBLEMS  UNUSUAL RASH Items with * indicate a potential emergency and should be followed up as soon as possible.  Feel free to call the clinic you have any questions or concerns. The clinic phone number is [phone].  Please show the CHEMO ALERT CARD at check-in to the Emergency Department and triage nurse.   

## 2016-06-02 NOTE — Telephone Encounter (Signed)
Appointments scheduled per 4.26.18 LOS. Patient will be contacted concerning 5.10 appointment for lab /flush / follow up following response on when to schedule patient in. Scheduled appointments given two patient along with AVS.

## 2016-06-04 ENCOUNTER — Ambulatory Visit: Payer: BLUE CROSS/BLUE SHIELD

## 2016-06-04 ENCOUNTER — Encounter: Payer: Self-pay | Admitting: Hematology

## 2016-06-06 ENCOUNTER — Encounter: Payer: BLUE CROSS/BLUE SHIELD | Admitting: Physical Therapy

## 2016-06-10 ENCOUNTER — Telehealth: Payer: Self-pay | Admitting: Hematology

## 2016-06-10 NOTE — Telephone Encounter (Signed)
Called patient to inform her of next scheduled appointments for 5.10.18. LVM

## 2016-06-14 NOTE — Progress Notes (Signed)
[facility]  [phone] [phone]  Clinic Follow Up Note   Patient Care Team: [name], MD as PCP - General (Internal Medicine) Verl Blalock, Marijo Conception, MD (Inactive) as Consulting Physician (Cardiology) [name], MD as Consulting Physician (Dermatology) 06/16/2016    CHIEF COMPLAINTS:  Follow up right breast cancer  Oncology History   Cancer Staging 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 breast in female, estrogen receptor positive ([place]) Staging form: Breast, AJCC 8th Edition - Clinical stage from 03/08/2016: Stage IA (cT1c, cN0, cM0, G2, ER: Positive, PR: Negative, HER2: Negative) - Signed by Truitt Merle, MD on 03/15/2016 - Pathologic stage from 04/12/2016: Stage IA (pT1c, pN0, cM0, G2, ER: Positive, PR: Negative, HER2: Negative, Oncotype DX score: 49) - Signed by Truitt Merle, MD on 05/05/2016       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 breast in female, estrogen receptor positive ([place])   03/04/2016 Mammogram    Diagnostic bilateral mammogram and right breast ultrasound showed a 1.7 cm mass in the 12:00 position of the right breast, highly suspicious for malignancy. There is a borderline abnormal right axillary lymph node, also suspicious for metastasis.       03/08/2016 Initial Diagnosis    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 breast in female, estrogen receptor positive ([place])      03/08/2016 Initial Biopsy    Right breast 12:00 core needle biopsy showed invasive ductal carcinoma, grade 2, right axillary lymph node biopsy was negative.      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  Genes Analyzed: 43 genes on [facility]'s Common Cancers panel (APC, ATM, AXIN2, BARD1, BMPR1A, BRCA1, BRCA2, BRIP1, CDH1, CDKN2A, CHEK2, DICER1, EPCAM, GREM1, HOXB13, KIT, MEN1, MLH1, MSH2, MSH6,  MUTYH, NBN, NF1, PALB2, PDGFRA, PMS2, POLD1, POLE, PTEN, RAD50, RAD51C, RAD51D, SDHA, SDHB, SDHC, SDHD, SMAD4, SMARCA4, STK11, TP53, TSC1, TSC2, VHL).       04/12/2016 Surgery    Bilateral mastectomy and right sentinel lymph node biopsy, by [name]      04/12/2016 Pathology Results    Right breast mastectomy showed invasive grade 2 invasive ductal carcinoma, 1.7 cm, margins were negative, 3 sentinel lymph nodes were negative, left simple mastectomy fibroadenoma, one benign node, no atypia or tumor.       05/04/2016 Oncotype testing    Recurrence score 49, high-risk, predicts 10 year risk of distant recurrence 32% with tamoxifen alone.      05/10/2016 Echocardiogram    Echo on 05/10/16 showed EF 55-60%.      05/20/2016 -  Chemotherapy    dose dense Adriamycin and Cytoxan, every 2 weeks, for 4 cycles, followed by Taxol  (weekly for 12 weeks or biweekly for 4 cycles) starting 05/20/16       HISTORY OF PRESENTING ILLNESS (03/16/16):  Chelsea Salinas 61 y.o. female is here because of recently diagnosed right breast invasive mammary carcinoma. She is accompanied by her husband to our multidisciplinary breast clinic today.  The patient underwent routine screening mammogram on 02/29/2016 and was found to have possible architectural distortion and asymmetry in the right breast. Also found was asymmetry in the left breast.  Accordingly a bilateral diagnostic mammogram and unilateral right breast ultrasound were performed on 03/04/2016. These scans showed a 1.7 cm mass in the 12 o'clock position of the right breast with  imaging features highly suspicious for malignancy. Also seen was borderline abnormal right axillary lymph node, suspicious for a metastatic node.  Biopsy of the right breast 12:00 o'clock position on 03/08/2016 revealed invasive mammary carcinoma. The carcinoma appears to be at least grade 2, ER positive, PR negative, HER-2 negative, Ki67 20%. The malignant cells are positive for  E-Cadherin, supporting a ductal phenotype. Biopsy of the right axillary lymph node showed no evidence of carcinoma.   The patient reports to the clinic today for follow up and to discuss chemotherapy. She has had surgery about 3 weeks ago and she has been doing well. She started antibiotics yesterday to help with infection that she has recently gotten. She has also started doing PT this week. She decided not to do reconstruction. She has been eating well.    GYN HISTORY  Menarchal:12 LMP: late 71's (hot flushes since mid 44's)  Contraceptive: 23 HRT: 3-4 years  G0P0:  CURRENT THERAPY: dose dense Adriamycin and Cytoxan, every 2 weeks, for 4 cycles, followed by Taxol  (weekly for 12 weeks or biweekly for 4 cycles) starting 05/20/16  INTERVAL HISTORY:  Chelsea Salinas returns for follow up and chemo. She presents to the infusion room today with her friend. She reports the first few days she did not feel good But she now has been active and feels better. She is doing yoga and walking as well as eating well. She denies nausea and vomiting and has good BM, no mouth sores but her gums are itchy. She has no oral thrush.    MEDICAL HISTORY:  Past Medical History:  Diagnosis Date  . Abnormal Pap smear of cervix    --in her 20s had colpo and some type of treatment to cervix.  . Allergy   . Asthma    "all the time, throughout the yr"  . 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 Breast cancer, right breast ([place]) 02/2016  . Eczema   . Family history of breast cancer   . Family history of genetic disease carrier    paternal relatives with a BRCA mutation  . Family history of ovarian cancer   . Hypertension   . Pneumonia    2000ish  . STD (sexually transmitted disease)    HSV II    SURGICAL HISTORY: Past Surgical History:  Procedure Laterality Date  . BREAST BIOPSY Right 04/13/2016   Procedure: Evacuation RIGHT Breast  Hematoma;  Surgeon: [name], MD;   Location: [facility];  Service: General;  Laterality: Right;  . COLONOSCOPY    . DILATION AND CURETTAGE OF UTERUS  2008  . MASTECTOMY Left 04/12/2016   prophylactic total mastectomy  . MASTECTOMY COMPLETE / SIMPLE W/ SENTINEL NODE BIOPSY Right 04/12/2016   axillary sentinel LNB  . MASTECTOMY W/ SENTINEL NODE BIOPSY Bilateral 04/12/2016   Procedure: BILATERAL TOTAL MASTECTOMIES  WITH RIGHT SENTINEL LYMPH NODE BIOPSY;  Surgeon: [name], MD;  Location: [facility];  Service: General;  Laterality: Bilateral;  . PORTACATH PLACEMENT Right 05/19/2016   Procedure: INSERTION PORT-A-CATH WITH [place];  Surgeon: [name], MD;  Location: [facility];  Service: General;  Laterality: Right;    SOCIAL HISTORY: Social History   Social History  . Marital status: Married    Spouse name: N/A  . Number of children: N/A  . Years of education: N/A   Occupational History  . Not on file.   Social History Main Topics  . Smoking status: Former Smoker  Packs/day: 1.00    Years: 25.00    Quit date: 02/08/1996  . Smokeless tobacco: Never Used  . Alcohol use 13.2 oz/week    15 Standard drinks or equivalent, 7 Glasses of wine per week  . Drug use: No  . Sexual activity: No   Other Topics Concern  . Not on file   Social History Narrative  . No narrative on file    FAMILY HISTORY: Family History  Problem Relation Age of Onset  . Polymyalgia rheumatica Mother   . Hypertension Father   . Heart disease Father        Dec 69  . Heart attack Father   . Prostate cancer Father        Dx 32s; deceased 29  . Breast cancer Cousin        Dx 33s; daughter of a paternal uncle  . Ovarian cancer Paternal Aunt        Dx 62; deceased  . Ovarian cancer Cousin        Dx 49s; daughter of a paternal uncle  . Ovarian cancer Cousin        Dx 11; daughter of a paternal uncle  . Ovarian cancer Paternal Aunt        Dx 69s.  BRCA positive  . Ovarian cancer Other        pat grandfather's mother    ALLERGIES:  has No  Known Allergies.  MEDICATIONS:  Current Outpatient Prescriptions  Medication Sig Dispense Refill  . acetaminophen (TYLENOL) 500 MG tablet Take 500-1,000 mg by mouth every 6 (six) hours as needed for mild pain or headache (depends on pain if takes 1-2 tablets).    Marland Kitchen aspirin EC 81 MG tablet Take 81 mg by mouth daily.    . cetirizine (ZYRTEC) 10 MG tablet Take 10 mg by mouth at bedtime.     . Cholecalciferol (VITAMIN D) 2000 units tablet Take 2,000 Units by mouth daily.    . cloNIDine (CATAPRES) 0.2 MG tablet Take 1 tablet by mouth at bedtime.   0  . escitalopram (LEXAPRO) 10 MG tablet Take 1 tablet by mouth daily at 12 noon.   0  . Flaxseed, Linseed, (FLAXSEED OIL PO) Take 1,400 mg by mouth every evening.    . fluticasone (FLONASE ALLERGY RELIEF) 50 MCG/ACT nasal spray Place 1 spray into both nostrils daily.    Marland Kitchen lidocaine-prilocaine (EMLA) cream Apply 1 application topically as needed. 30 g 3  . losartan (COZAAR) 100 MG tablet Take 1 tablet (100 mg total) by mouth daily. 30 tablet 11  . magnesium oxide (MAG-OX) 400 MG tablet Take 400 mg by mouth every other day.    . montelukast (SINGULAIR) 10 MG tablet TAKE 1 TABLET(10 MG) BY MOUTH DAILY (Patient taking differently: TAKE 1 TABLET BY MOUTH AS NEEDED FOR SHORTNESS OF BREATH) 90 tablet 0  . Multiple Vitamins-Minerals (MULTIVITAMIN WITH MINERALS) tablet Take 1 tablet by mouth every evening.     . Omega-3 Fatty Acids ([employer] FISH OIL) 1200 MG CAPS Take 1,200 mg by mouth every evening.     . ondansetron (ZOFRAN) 8 MG tablet Take 1 tablet (8 mg total) by mouth 2 (two) times daily as needed. Start on the third day after chemotherapy. 30 tablet 1  . prochlorperazine (COMPAZINE) 10 MG tablet Take 1 tablet (10 mg total) by mouth every 6 (six) hours as needed (Nausea or vomiting). 30 tablet 1  . valACYclovir (VALTREX) 500 MG tablet Take 1 tablet (500 mg total)  by mouth 2 (two) times daily. Take for 3 days as needed. 30 tablet 1  . venlafaxine XR (EFFEXOR-XR)  75 MG 24 hr capsule TAKE 2 CAPSULES BY MOUTH EVERY MORNING AND 1 CAPSULE EVERY EVENING 270 capsule 0  . vitamin B-12 (CYANOCOBALAMIN) 100 MCG tablet Take 100 mcg by mouth every other day.      No current facility-administered medications for this visit.     REVIEW OF SYSTEMS:   Constitutional: Denies fevers, chills or abnormal night sweats Eyes: Denies blurriness of vision, double vision or watery eyes Ears, nose, mouth, throat, and face: Denies mucositis or sore throat Respiratory: Denies cough, dyspnea or wheezes Cardiovascular: Denies palpitation, chest discomfort or lower extremity swelling Gastrointestinal:  Denies heartburn or change in bowel habits (+) some nausea  Skin: No skin rashes or lesions.(+) dryness of hands Lymphatics: Denies new lymphadenopathy or easy bruising Neurological:Denies numbness, tingling or new weaknesses Behavioral/Psych: Mood is stable, no new changes  All other systems were reviewed with the patient and are negative.  PHYSICAL EXAMINATION:  ECOG PERFORMANCE STATUS: 0 - Asymptomatic Blood pressure 129/85, heart rate 80, respiratory rate 18, temperature 36.8, pulse ox 100% on room air GENERAL:alert, no distress and comfortable SKIN: skin color, texture, turgor are normal, no rashes or significant lesions, Dryness pof skin and color changes to nail nail bed. EYES: normal, conjunctiva are pink and non-injected, sclera clear OROPHARYNX:no exudate, no erythema and lips, buccal mucosa, and tongue normal, no oral thrush NECK: supple, thyroid normal size, non-tender, without nodularity LYMPH:  no palpable lymphadenopathy in the cervical, axillary or inguinal LUNGS: clear to auscultation and percussion with normal breathing effort HEART: regular rate & rhythm and no murmurs and no lower extremity edema ABDOMEN:abdomen soft, non-tender and normal bowel sounds Musculoskeletal:no cyanosis of digits and no clubbing  PSYCH: alert & oriented x 3 with fluent  speech NEURO: no focal motor/sensory deficits Breasts: Breast inspection showed Status post bilateral mastectomy, surgical incisions have healed well with no discharge or surrounding skin Erythema. She has residual mild skin erythema on the right side chest wall, and mild soft tissue fullness at his right axilla secondary to surgery.  LABORATORY DATA:  I have reviewed the data as listed CBC Latest Ref Rng & Units 06/16/2016 06/02/2016 05/27/2016  WBC 3.9 - 10.3 10e3/uL 10.0 14.4(H) 2.8(L)  Hemoglobin 11.6 - 15.9 g/dL 10.5(L) 10.8(L) 11.2(L)  Hematocrit 34.8 - 46.6 % 31.9(L) 32.3(L) 33.8(L)  Platelets 145 - 400 10e3/uL 207 191 150    CMP Latest Ref Rng & Units 06/16/2016 06/02/2016 05/27/2016  Glucose 70 - 140 mg/dl 99 97 101  BUN 7.0 - 26.0 mg/dL 10.1 9.9 13.1  Creatinine 0.6 - 1.1 mg/dL 0.6 0.7 0.6  Sodium 136 - 145 mEq/L 141 145 139  Potassium 3.5 - 5.1 mEq/L 4.1 4.3 3.9  Chloride 101 - 111 mmol/L - - -  CO2 22 - 29 mEq/L _0 Calcium 8.4 - 10.4 mg/dL 9.1 9.4 9.6  Total Protein 6.4 - 8.3 g/dL 6.6 6.6 6.7  Total Bilirubin 0.20 - 1.20 mg/dL <0.22 <0.22 0.22  Alkaline Phos 40 - 150 U/L 127 129 115  AST 5 - 34 U/L _1 ALT 0 - 55 U/L 20 22 46    PATHOLOGY  Diagnosis 04/12/16 1. Breast, simple mastectomy, Left Prophylactic Total - BREAST PARENCHYMA SHOWING FIBROCYSTIC CHANGES WITH APOCRINE METAPLASIA AND FLORID USUAL DUCTAL HYPERPLASIA. - FIBROADENOMA. - ONE BENIGN LYMPH NODE (0/1). - NO ATYPIA OR TUMOR SEEN. -  GROSSLY UNREMARKABLE SKIN. 2. Breast, simple mastectomy, Right Total - INVASIVE, GRADE 2 DUCTAL CARCINOMA, SPANNING 1.7 CM IN GREATEST DIMENSION. - MARGINS ARE NEGATIVE. - GROSSLY UNREMARKABLE SKIN. - SEE ONCOLOGY TEMPLATE. 3. Lymph node, sentinel, biopsy, Right Axillary #1 - ONE BENIGN WITH NO TUMOR SEEN (0/1). - SEE COMMENT. 4. Lymph node, sentinel, biopsy, Right - ONE BENIGN WITH NO TUMOR SEEN (0/1). - SEE COMMENT. 5. Lymph node, sentinel, biopsy, Right -  ONE BENIGN WITH NO TUMOR SEEN (0/1). - SEE COMMENT.  Diagnosis 03/08/2016 1. Breast, right, needle core biopsy, 12:00 o'clock - INVASIVE MAMMARY CARCINOMA. - SEE COMMENT. 2. Lymph node, needle/core biopsy, right axilla - THERE IS NO EVIDENCE OF CARCINOMA IN 1 OF 1 LYMPH NODE (0/1).   RADIOGRAPHIC STUDIES: I have personally reviewed the radiological images as listed and agreed with the findings in the report. Dg Chest Port 1 View  Result Date: 05/19/2016 CLINICAL DATA:  Port-A-Cath placement EXAM: PORTABLE CHEST 1 VIEW COMPARISON:  03/11/2008 FINDINGS: Right internal jugular power port tip in the SVC just below the azygos level. No pneumothorax. Lungs remain clear. Heart size is normal. IMPRESSION: Right internal jugular power port placement. Tip in the SVC just below the azygos level, 5 cm above the right atrium. No complication. Electronically Signed   By: Nelson Chimes M.D.   On: 05/19/2016 09:20   Dg Fluoro Guide Cv Line-no Report  Result Date: 05/19/2016 Fluoroscopy was utilized by the requesting physician.  No radiographic interpretation.     ECHO 05/10/16 Study Conclusions - Left ventricle: The cavity size was normal. Systolic function was   normal. The estimated ejection fraction was in the range of 55%   to 60%. Wall motion was normal; there were no regional wall   motion abnormalities. Doppler parameters are consistent with   abnormal left ventricular relaxation (grade 1 diastolic   dysfunction). There was no evidence of elevated ventricular   filling pressure by Doppler parameters. - Aortic valve: Trileaflet; normal thickness leaflets. There was no   regurgitation. - Aortic root: The aortic root was normal in size. - Mitral valve: Structurally normal valve. There was no   regurgitation. - Right ventricle: The cavity size was normal. Wall thickness was   normal. Systolic function was normal. - Tricuspid valve: There was trivial regurgitation. - Pulmonary arteries: Systolic  pressure was within the normal   range. - Inferior vena cava: The vessel was normal in size. - Pericardium, extracardiac: There was no pericardial effusion.   ASSESSMENT & PLAN:  61 y.o. postmenopausal woman, presented with screening discovered right breast cancer  1. 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 Oncotype RS 49, high risk  - Patient has had bilateral total mastectomy on 04/12/16 and denied reconstruction.  -I previously reviewed her surgical pathology findings with patient and her husband in details. Her surgical margins were negative, 3 sentinel lymph nodes were also negative. - Her oncotype is back at 49. This is fairly high risk disease based on the RS. the estimated 10 year risk of distant recurrence with tamoxifen alone is 32%. I strongly recommend chemotherapy to reduce her risk of cancer recurrence after surgery (by ~50%). -she has started adjuvant chemotherapy with dose dense Adriamycin and Cytoxan, followed by Taxol - Baseline Echo on 05/10/16 showed EF 55-60%. -she has been tolerating chemo well, mild fatigue, no other significant side effects -lab reviewed, adequate for treatment. She will continue  cycle 3 adjuvant chemotherapy with Adriamycin and Cytoxan today with Onpro - Next Cycle is 06/16/16 and will get labs and Flush on this day as well; again on 06/30/16  2. BRCA1 mutation (+) -Due to her strong family history of ovarian cancer and also positive family history of breast cancer, she underwent genetic testing - Patient is positive for BRCA1 Mutation -We previously discussed the high risk of breast cancer and colon cancer due to the BRCA1 mutation. She is status post bilateral mastectomy. She is agreeable to have BSO, she has discussed with her gynecologist. I have recommended her to have this surgery done after she completes adjuvant chemotherapy. - Her sister has  done genetic testing, results pending -She has no children.  3. 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   All questions were answered. The patient knows to call the clinic with any problems, questions or concerns. I spent 15 minutes counseling the patient face to face. The total time spent in the appointment was 20 minutes and more than 50% was on counseling.  This document serves as a record of services personally performed by Truitt Merle, MD. It was created on her behalf by Joslyn Devon, a trained medical scribe. The creation of this record is based on the scribe's personal observations and the provider's statements to them. This document has been checked and approved by the attending provider.    Truitt Merle, MD 06/16/2016

## 2016-06-16 ENCOUNTER — Other Ambulatory Visit (HOSPITAL_BASED_OUTPATIENT_CLINIC_OR_DEPARTMENT_OTHER): Payer: BLUE CROSS/BLUE SHIELD

## 2016-06-16 ENCOUNTER — Encounter: Payer: Self-pay | Admitting: *Deleted

## 2016-06-16 ENCOUNTER — Ambulatory Visit (HOSPITAL_BASED_OUTPATIENT_CLINIC_OR_DEPARTMENT_OTHER): Payer: BLUE CROSS/BLUE SHIELD

## 2016-06-16 ENCOUNTER — Ambulatory Visit (HOSPITAL_BASED_OUTPATIENT_CLINIC_OR_DEPARTMENT_OTHER): Payer: BLUE CROSS/BLUE SHIELD | Admitting: Hematology

## 2016-06-16 VITALS — BP 129/85 | HR 80 | Temp 98.2°F | Resp 18

## 2016-06-16 DIAGNOSIS — C50111 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 female breast: Secondary | ICD-10-CM | POA: Diagnosis not present

## 2016-06-16 DIAGNOSIS — Z170 Estrogen receptor positive status [ER+]: Secondary | ICD-10-CM | POA: Diagnosis not present

## 2016-06-16 DIAGNOSIS — Z5111 Encounter for antineoplastic chemotherapy: Secondary | ICD-10-CM | POA: Diagnosis not present

## 2016-06-16 DIAGNOSIS — T451X5A Adverse effect of antineoplastic and immunosuppressive drugs, initial encounter: Secondary | ICD-10-CM

## 2016-06-16 DIAGNOSIS — D6481 Anemia due to antineoplastic chemotherapy: Secondary | ICD-10-CM

## 2016-06-16 DIAGNOSIS — I10 Essential (primary) hypertension: Secondary | ICD-10-CM

## 2016-06-16 DIAGNOSIS — Z1509 Genetic susceptibility to other malignant neoplasm: Secondary | ICD-10-CM

## 2016-06-16 DIAGNOSIS — Z1501 Genetic susceptibility to malignant neoplasm of breast: Secondary | ICD-10-CM

## 2016-06-16 LAB — CBC WITH DIFFERENTIAL/PLATELET
BASO%: 0.6 % (ref 0.0–2.0)
Basophils Absolute: 0.1 10*3/uL (ref 0.0–0.1)
EOS%: 0.1 % (ref 0.0–7.0)
Eosinophils Absolute: 0 10*3/uL (ref 0.0–0.5)
HEMATOCRIT: 31.9 % — AB (ref 34.8–46.6)
HEMOGLOBIN: 10.5 g/dL — AB (ref 11.6–15.9)
LYMPH#: 1.7 10*3/uL (ref 0.9–3.3)
LYMPH%: 17 % (ref 14.0–49.7)
MCH: 32.5 pg (ref 25.1–34.0)
MCHC: 32.9 g/dL (ref 31.5–36.0)
MCV: 98.8 fL (ref 79.5–101.0)
MONO#: 1 10*3/uL — AB (ref 0.1–0.9)
MONO%: 9.9 % (ref 0.0–14.0)
NEUT%: 72.4 % (ref 38.4–76.8)
NEUTROS ABS: 7.3 10*3/uL — AB (ref 1.5–6.5)
Platelets: 207 10*3/uL (ref 145–400)
RBC: 3.23 10*6/uL — ABNORMAL LOW (ref 3.70–5.45)
RDW: 14.6 % — AB (ref 11.2–14.5)
WBC: 10 10*3/uL (ref 3.9–10.3)

## 2016-06-16 LAB — COMPREHENSIVE METABOLIC PANEL
ALBUMIN: 4 g/dL (ref 3.5–5.0)
ALK PHOS: 127 U/L (ref 40–150)
ALT: 20 U/L (ref 0–55)
AST: 20 U/L (ref 5–34)
Anion Gap: 10 mEq/L (ref 3–11)
BUN: 10.1 mg/dL (ref 7.0–26.0)
CALCIUM: 9.1 mg/dL (ref 8.4–10.4)
CHLORIDE: 106 meq/L (ref 98–109)
CO2: 25 mEq/L (ref 22–29)
CREATININE: 0.6 mg/dL (ref 0.6–1.1)
EGFR: >90 mL/min/{1.73_m2} (ref >90)
Glucose: 99 mg/dl (ref 70–140)
Potassium: 4.1 mEq/L (ref 3.5–5.1)
Sodium: 141 mEq/L (ref 136–145)
TOTAL PROTEIN: 6.6 g/dL (ref 6.4–8.3)

## 2016-06-16 MED ORDER — PALONOSETRON HCL INJECTION 0.25 MG/5ML
0.2500 mg | Freq: Once | INTRAVENOUS | Status: AC
Start: 2016-06-16 — End: 2016-06-16
  Administered 2016-06-16: 0.25 mg via INTRAVENOUS

## 2016-06-16 MED ORDER — PEGFILGRASTIM 6 MG/0.6ML ~~LOC~~ PSKT
6.0000 mg | PREFILLED_SYRINGE | Freq: Once | SUBCUTANEOUS | Status: AC
  Administered 2016-06-16: 6 mg via SUBCUTANEOUS
  Filled 2016-06-16: qty 0.6

## 2016-06-16 MED ORDER — PALONOSETRON HCL INJECTION 0.25 MG/5ML
INTRAVENOUS | Status: AC
  Filled 2016-06-16: qty 5

## 2016-06-16 MED ORDER — HEPARIN SOD (PORK) LOCK FLUSH 100 UNIT/ML IV SOLN
500.0000 [IU] | Freq: Once | INTRAVENOUS | Status: AC | PRN
  Administered 2016-06-16: 500 [IU]
  Filled 2016-06-16: qty 5

## 2016-06-16 MED ORDER — SODIUM CHLORIDE 0.9 % IV SOLN
Freq: Once | INTRAVENOUS | Status: AC
  Administered 2016-06-16: 11:00:00 via INTRAVENOUS
  Filled 2016-06-16: qty 5

## 2016-06-16 MED ORDER — SODIUM CHLORIDE 0.9 % IV SOLN
Freq: Once | INTRAVENOUS | Status: AC
  Administered 2016-06-16: 09:00:00 via INTRAVENOUS

## 2016-06-16 MED ORDER — DOXORUBICIN HCL CHEMO IV INJECTION 2 MG/ML
60.0000 mg/m2 | Freq: Once | INTRAVENOUS | Status: AC
  Administered 2016-06-16: 106 mg via INTRAVENOUS
  Filled 2016-06-16: qty 53

## 2016-06-16 MED ORDER — SODIUM CHLORIDE 0.9% FLUSH
10.0000 mL | INTRAVENOUS | Status: DC | PRN
  Administered 2016-06-16: 10 mL
  Filled 2016-06-16: qty 10

## 2016-06-16 MED ORDER — SODIUM CHLORIDE 0.9 % IV SOLN
600.0000 mg/m2 | Freq: Once | INTRAVENOUS | Status: AC
  Administered 2016-06-16: 1060 mg via INTRAVENOUS
  Filled 2016-06-16: qty 53

## 2016-06-17 ENCOUNTER — Encounter: Payer: Self-pay | Admitting: Hematology

## 2016-06-17 ENCOUNTER — Telehealth: Payer: Self-pay | Admitting: Hematology

## 2016-06-17 DIAGNOSIS — D6481 Anemia due to antineoplastic chemotherapy: Secondary | ICD-10-CM | POA: Insufficient documentation

## 2016-06-17 DIAGNOSIS — T451X5A Adverse effect of antineoplastic and immunosuppressive drugs, initial encounter: Secondary | ICD-10-CM

## 2016-06-17 NOTE — Telephone Encounter (Signed)
No LOS per 06/16/16 visit.

## 2016-06-18 ENCOUNTER — Ambulatory Visit: Payer: BLUE CROSS/BLUE SHIELD

## 2016-06-22 ENCOUNTER — Other Ambulatory Visit: Payer: Self-pay

## 2016-06-22 MED ORDER — CLONIDINE HCL 0.2 MG PO TABS: 0.2000 mg | ORAL_TABLET | Freq: Every day | ORAL | 0 refills | Status: DC

## 2016-06-29 NOTE — Progress Notes (Signed)
[place]  [phone] Fax:([phone]  Clinic Follow Up Note   Patient Care Team: [name], MD as PCP - General (Internal Medicine) Verl Blalock, Marijo Conception, MD (Inactive) as Consulting Physician (Cardiology) [name], MD as Consulting Physician (Dermatology) 06/30/2016    CHIEF COMPLAINTS:  Follow up right breast cancer  Oncology History   Cancer Staging 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 breast in female, estrogen receptor positive ([place]) Staging form: Breast, AJCC 8th Edition - Clinical stage from 03/08/2016: Stage IA (cT1c, cN0, cM0, G2, ER: Positive, PR: Negative, HER2: Negative) - Signed by Truitt Merle, MD on 03/15/2016 - Pathologic stage from 04/12/2016: Stage IA (pT1c, pN0, cM0, G2, ER: Positive, PR: Negative, HER2: Negative, Oncotype DX score: 49) - Signed by Truitt Merle, MD on 05/05/2016       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 breast in female, estrogen receptor positive ([place])   03/04/2016 Mammogram    Diagnostic bilateral mammogram and right breast ultrasound showed a 1.7 cm mass in the 12:00 position of the right breast, highly suspicious for malignancy. There is a borderline abnormal right axillary lymph node, also suspicious for metastasis.       03/08/2016 Initial Diagnosis    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 breast in female, estrogen receptor positive ([place])      03/08/2016 Initial Biopsy    Right breast 12:00 core needle biopsy showed invasive ductal carcinoma, grade 2, right axillary lymph node biopsy was negative.      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  Genes Analyzed: 43 genes on [facility]'s Common Cancers panel (APC, ATM, AXIN2, BARD1, BMPR1A, BRCA1, BRCA2, BRIP1, CDH1, CDKN2A, CHEK2, DICER1, EPCAM, GREM1, HOXB13, KIT, MEN1, MLH1, MSH2, MSH6,  MUTYH, NBN, NF1, PALB2, PDGFRA, PMS2, POLD1, POLE, PTEN, RAD50, RAD51C, RAD51D, SDHA, SDHB, SDHC, SDHD, SMAD4, SMARCA4, STK11, TP53, TSC1, TSC2, VHL).       04/12/2016 Surgery    Bilateral mastectomy and right sentinel lymph node biopsy, by [name]      04/12/2016 Pathology Results    Right breast mastectomy showed invasive grade 2 invasive ductal carcinoma, 1.7 cm, margins were negative, 3 sentinel lymph nodes were negative, left simple mastectomy fibroadenoma, one benign node, no atypia or tumor.       05/04/2016 Oncotype testing    Recurrence score 49, high-risk, predicts 10 year risk of distant recurrence 32% with tamoxifen alone.      05/10/2016 Echocardiogram    Echo on 05/10/16 showed EF 55-60%.      05/20/2016 -  Chemotherapy    dose dense Adriamycin and Cytoxan, every 2 weeks starting 05/20/16 for 4 cycles, followed by Taxol starting 07/15/16 (weekly for 12 weeks or biweekly for 4 cycles) ending 09/30/16.       Breast cancer, right ([place])   04/12/2016 Initial Diagnosis    Breast cancer, right ([place])      HISTORY OF PRESENTING ILLNESS (03/16/16):  Chelsea Salinas 61 y.o. female is here because of recently diagnosed right breast invasive mammary carcinoma. She is accompanied by her husband to our multidisciplinary breast clinic today.  The patient underwent routine screening mammogram on 02/29/2016 and was found to have possible architectural distortion and asymmetry in the right breast. Also found was asymmetry in the left breast.  Accordingly a bilateral diagnostic mammogram and unilateral  right breast ultrasound were performed on 03/04/2016. These scans showed a 1.7 cm mass in the 12 o'clock position of the right breast with imaging features highly suspicious for malignancy. Also seen was borderline abnormal right axillary lymph node, suspicious for a metastatic node.  Biopsy of the right breast 12:00 o'clock position on 03/08/2016 revealed invasive mammary carcinoma. The carcinoma  appears to be at least grade 2, ER positive, PR negative, HER-2 negative, Ki67 20%. The malignant cells are positive for E-Cadherin, supporting a ductal phenotype. Biopsy of the right axillary lymph node showed no evidence of carcinoma.   The patient reports to the clinic today for follow up and to discuss chemotherapy. She has had surgery about 3 weeks ago and she has been doing well. She started antibiotics yesterday to help with infection that she has recently gotten. She has also started doing PT this week. She decided not to do reconstruction. She has been eating well.    GYN HISTORY  Menarchal:12 LMP: late 35's (hot flushes since mid 40's)  Contraceptive: 23 HRT: 3-4 years  G0P0:  CURRENT THERAPY: dose dense Adriamycin and Cytoxan, every 2 weeks starting 05/20/16 for 4 cycles, followed by Taxol starting 07/15/16 (weekly for 12 weeks or biweekly for 4 cycles) ending 09/30/16.  INTERVAL HISTORY:  [name] returns for follow up and last cycle AC chemo.She presents to the clinic today with a friend. Her last cycle had her feeling really tired. She slept for 14 hours. And felt normal two days later. She denies nausea and diarrhea but she started with constipation and resolves two days later. She would like a signature for a massage.    MEDICAL HISTORY:  Past Medical History:  Diagnosis Date  . Abnormal Pap smear of cervix    --in her 20s had colpo and some type of treatment to cervix.  . Allergy   . Asthma    "all the time, throughout the yr"  . 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 Breast cancer, right breast ([place]) 02/2016  . Eczema   . Family history of breast cancer   . Family history of genetic disease carrier    paternal relatives with a BRCA mutation  . Family history of ovarian cancer   . Hypertension   . Pneumonia    2000ish  . STD (sexually transmitted disease)    HSV II    SURGICAL HISTORY: Past Surgical History:  Procedure  Laterality Date  . BREAST BIOPSY Right 04/13/2016   Procedure: Evacuation RIGHT Breast  Hematoma;  Surgeon: [name], MD;  Location: [facility];  Service: General;  Laterality: Right;  . COLONOSCOPY    . DILATION AND CURETTAGE OF UTERUS  2008  . MASTECTOMY Left 04/12/2016   prophylactic total mastectomy  . MASTECTOMY COMPLETE / SIMPLE W/ SENTINEL NODE BIOPSY Right 04/12/2016   axillary sentinel LNB  . MASTECTOMY W/ SENTINEL NODE BIOPSY Bilateral 04/12/2016   Procedure: BILATERAL TOTAL MASTECTOMIES  WITH RIGHT SENTINEL LYMPH NODE BIOPSY;  Surgeon: [name], MD;  Location: [facility];  Service: General;  Laterality: Bilateral;  . PORTACATH PLACEMENT Right 05/19/2016   Procedure: INSERTION PORT-A-CATH WITH [place];  Surgeon: [name], MD;  Location: [facility];  Service: General;  Laterality: Right;    SOCIAL HISTORY: Social History   Social History  . Marital status: Married    Spouse name: N/A  . Number of children: N/A  . Years of education: N/A   Occupational History  .  Not on file.   Social History Main Topics  . Smoking status: Former Smoker    Packs/day: 1.00    Years: 25.00    Quit date: 02/08/1996  . Smokeless tobacco: Never Used  . Alcohol use 13.2 oz/week    15 Standard drinks or equivalent, 7 Glasses of wine per week  . Drug use: No  . Sexual activity: No   Other Topics Concern  . Not on file   Social History Narrative  . No narrative on file    FAMILY HISTORY: Family History  Problem Relation Age of Onset  . Polymyalgia rheumatica Mother   . Hypertension Father   . Heart disease Father        Dec 69  . Heart attack Father   . Prostate cancer Father        Dx 59s; deceased 26  . Breast cancer Cousin        Dx 58s; daughter of a paternal uncle  . Ovarian cancer Paternal Aunt        Dx 34; deceased  . Ovarian cancer Cousin        Dx 68s; daughter of a paternal uncle  . Ovarian cancer Cousin        Dx 9; daughter of a paternal uncle  . Ovarian  cancer Paternal Aunt        Dx 33s.  BRCA positive  . Ovarian cancer Other        pat grandfather's mother    ALLERGIES:  has No Known Allergies.  MEDICATIONS:  Current Outpatient Prescriptions  Medication Sig Dispense Refill  . acetaminophen (TYLENOL) 500 MG tablet Take 500-1,000 mg by mouth every 6 (six) hours as needed for mild pain or headache (depends on pain if takes 1-2 tablets).    Marland Kitchen aspirin EC 81 MG tablet Take 81 mg by mouth daily.    . cetirizine (ZYRTEC) 10 MG tablet Take 10 mg by mouth at bedtime.     . Cholecalciferol (VITAMIN D) 2000 units tablet Take 2,000 Units by mouth daily.    . cloNIDine (CATAPRES) 0.2 MG tablet Take 1 tablet (0.2 mg total) by mouth at bedtime. 90 tablet 0  . escitalopram (LEXAPRO) 10 MG tablet Take 1 tablet by mouth daily at 12 noon.   0  . Flaxseed, Linseed, (FLAXSEED OIL PO) Take 1,400 mg by mouth every evening.    . fluticasone (FLONASE ALLERGY RELIEF) 50 MCG/ACT nasal spray Place 1 spray into both nostrils daily.    Marland Kitchen lidocaine-prilocaine (EMLA) cream Apply 1 application topically as needed. 30 g 3  . losartan (COZAAR) 100 MG tablet Take 1 tablet (100 mg total) by mouth daily. 30 tablet 11  . magnesium oxide (MAG-OX) 400 MG tablet Take 400 mg by mouth every other day.    . montelukast (SINGULAIR) 10 MG tablet TAKE 1 TABLET(10 MG) BY MOUTH DAILY (Patient taking differently: TAKE 1 TABLET BY MOUTH AS NEEDED FOR SHORTNESS OF BREATH) 90 tablet 0  . Multiple Vitamins-Minerals (MULTIVITAMIN WITH MINERALS) tablet Take 1 tablet by mouth every evening.     . Omega-3 Fatty Acids ([employer] FISH OIL) 1200 MG CAPS Take 1,200 mg by mouth every evening.     . ondansetron (ZOFRAN) 8 MG tablet Take 1 tablet (8 mg total) by mouth 2 (two) times daily as needed. Start on the third day after chemotherapy. 30 tablet 1  . prochlorperazine (COMPAZINE) 10 MG tablet Take 1 tablet (10 mg total) by mouth every 6 (six) hours  as needed (Nausea or vomiting). 30 tablet 1  .  valACYclovir (VALTREX) 500 MG tablet Take 1 tablet (500 mg total) by mouth 2 (two) times daily. Take for 3 days as needed. 30 tablet 1  . venlafaxine XR (EFFEXOR-XR) 75 MG 24 hr capsule TAKE 2 CAPSULES BY MOUTH EVERY MORNING AND 1 CAPSULE EVERY EVENING 270 capsule 0  . vitamin B-12 (CYANOCOBALAMIN) 100 MCG tablet Take 100 mcg by mouth every other day.      No current facility-administered medications for this visit.    Facility-Administered Medications Ordered in Other Visits  Medication Dose Route Frequency Provider Last Rate Last Dose  . sodium chloride flush (NS) 0.9 % injection 10 mL  10 mL Intravenous PRN Truitt Merle, MD   10 mL at 06/30/16 0844    REVIEW OF SYSTEMS:   Constitutional: Denies fevers, chills or abnormal night sweats Eyes: Denies blurriness of vision, double vision or watery eyes Ears, nose, mouth, throat, and face: Denies mucositis or sore throat Respiratory: Denies cough, dyspnea or wheezes Cardiovascular: Denies palpitation, chest discomfort or lower extremity swelling Gastrointestinal:  Denies heartburn (+) some constipation Skin: No skin rashes or lesions.(+) dryness of hands Lymphatics: Denies new lymphadenopathy or easy bruising Neurological:Denies numbness, tingling or new weaknesses Behavioral/Psych: Mood is stable, no new changes  All other systems were reviewed with the patient and are negative.  PHYSICAL EXAMINATION:  ECOG PERFORMANCE STATUS: 0 - Asymptomatic  Vitals:   06/30/16 0915  BP: 127/83  Pulse: 72  Resp: 20  Temp: (!) 96.7 F (35.9 C)  TempSrc: Oral  SpO2: 100%  Weight: 152 lb 4.8 oz (69.1 kg)  Height: 5' 4"  (1.626 m)     GENERAL:alert, no distress and comfortable SKIN: skin color, texture, turgor are normal, no rashes or significant lesions, Dryness pof skin and color changes to nail nail bed. EYES: normal, conjunctiva are pink and non-injected, sclera clear OROPHARYNX:no exudate, no erythema and lips, buccal mucosa, and tongue normal,  no oral thrush NECK: supple, thyroid normal size, non-tender, without nodularity LYMPH:  no palpable lymphadenopathy in the cervical, axillary or inguinal LUNGS: clear to auscultation and percussion with normal breathing effort HEART: regular rate & rhythm and no murmurs and no lower extremity edema ABDOMEN:abdomen soft, non-tender and normal bowel sounds Musculoskeletal:no cyanosis of digits and no clubbing  PSYCH: alert & oriented x 3 with fluent speech NEURO: no focal motor/sensory deficits Breasts: Breast inspection showed Status post bilateral mastectomy, surgical incisions have healed well with no discharge or surrounding skin Erythema.   LABORATORY DATA:  I have reviewed the data as listed CBC Latest Ref Rng & Units 06/30/2016 06/16/2016 06/02/2016  WBC 3.9 - 10.3 10e3/uL 12.6(H) 10.0 14.4(H)  Hemoglobin 11.6 - 15.9 g/dL 10.3(L) 10.5(L) 10.8(L)  Hematocrit 34.8 - 46.6 % 30.9(L) 31.9(L) 32.3(L)  Platelets 145 - 400 10e3/uL 196 207 191    CMP Latest Ref Rng & Units 06/30/2016 06/16/2016 06/02/2016  Glucose 70 - 140 mg/dl 95 99 97  BUN 7.0 - 26.0 mg/dL 14.2 10.1 9.9  Creatinine 0.6 - 1.1 mg/dL 0.7 0.6 0.7  Sodium 136 - 145 mEq/L 139 141 145  Potassium 3.5 - 5.1 mEq/L 4.1 4.1 4.3  [place] 101 - 111 mmol/L - - -  CO2 22 - 29 mEq/L 24 25 27   Calcium 8.4 - 10.4 mg/dL 8.9 9.1 9.4  Total Protein 6.4 - 8.3 g/dL 6.2(L) 6.6 6.6  Total Bilirubin 0.20 - 1.20 mg/dL <0.22 <0.22 <0.22  Alkaline Phos 40 - 150  U/L 139 127 129  AST 5 - 34 U/L 18 20 21   ALT 0 - 55 U/L 18 20 22     PATHOLOGY  Diagnosis 04/12/16 1. Breast, simple mastectomy, Left Prophylactic Total - BREAST PARENCHYMA SHOWING FIBROCYSTIC CHANGES WITH APOCRINE METAPLASIA AND FLORID USUAL DUCTAL HYPERPLASIA. - FIBROADENOMA. - ONE BENIGN LYMPH NODE (0/1). - NO ATYPIA OR TUMOR SEEN. - GROSSLY UNREMARKABLE SKIN. 2. Breast, simple mastectomy, Right Total - INVASIVE, GRADE 2 DUCTAL CARCINOMA, SPANNING 1.7 CM IN GREATEST DIMENSION. -  MARGINS ARE NEGATIVE. - GROSSLY UNREMARKABLE SKIN. - SEE ONCOLOGY TEMPLATE. 3. Lymph node, sentinel, biopsy, Right Axillary #1 - ONE BENIGN WITH NO TUMOR SEEN (0/1). - SEE COMMENT. 4. Lymph node, sentinel, biopsy, Right - ONE BENIGN WITH NO TUMOR SEEN (0/1). - SEE COMMENT. 5. Lymph node, sentinel, biopsy, Right - ONE BENIGN WITH NO TUMOR SEEN (0/1). - SEE COMMENT.  Diagnosis 03/08/2016 1. Breast, right, needle core biopsy, 12:00 o'clock - INVASIVE MAMMARY CARCINOMA. - SEE COMMENT. 2. Lymph node, needle/core biopsy, right axilla - THERE IS NO EVIDENCE OF CARCINOMA IN 1 OF 1 LYMPH NODE (0/1).   RADIOGRAPHIC STUDIES: I have personally reviewed the radiological images as listed and agreed with the findings in the report. No results found.   ECHO 05/10/16 Study Conclusions - Left ventricle: The cavity size was normal. Systolic function was   normal. The estimated ejection fraction was in the range of 55%   to 60%. Wall motion was normal; there were no regional wall   motion abnormalities. Doppler parameters are consistent with   abnormal left ventricular relaxation (grade 1 diastolic   dysfunction). There was no evidence of elevated ventricular   filling pressure by Doppler parameters. - Aortic valve: Trileaflet; normal thickness leaflets. There was no   regurgitation. - Aortic root: The aortic root was normal in size. - Mitral valve: Structurally normal valve. There was no   regurgitation. - Right ventricle: The cavity size was normal. Wall thickness was   normal. Systolic function was normal. - Tricuspid valve: There was trivial regurgitation. - Pulmonary arteries: Systolic pressure was within the normal   range. - Inferior vena cava: The vessel was normal in size. - Pericardium, extracardiac: There was no pericardial effusion.   ASSESSMENT & PLAN:  61 y.o. postmenopausal woman, presented with screening discovered right breast cancer  1. Malignant neoplasm of central  portion of  right breast, invasive ductal carcinoma, G2, Stage IA, pT1cN0M0 ER weakly positive, PR and HER-2 negative. Oncotype RS 49, high risk  - Patient has had bilateral total mastectomy on 04/12/16 and denied reconstruction.  -I previously reviewed her surgical pathology findings with patient and her husband in details. Her surgical margins were negative, 3 sentinel lymph nodes were also negative. - Her oncotype is back at 49. This is fairly high risk disease based on the RS. the estimated 10 year risk of distant recurrence with tamoxifen alone is 32%. I strongly recommend chemotherapy to reduce her risk of cancer recurrence after surgery (by ~50%). -she has started adjuvant chemotherapy with dose dense Adriamycin and Cytoxan, followed by Taxol - Baseline Echo on 05/10/16 showed EF 55-60%. -she has been tolerating chemo well, mild fatigue, no other significant side effects -lab reviewed, adequate for treatment. She will continue cycle 4 adjuvant chemotherapy with Adriamycin and Cytoxan today with Onpro -she will start Taxol in 2 weeks. We discussed the side effects of Taxol   2. BRCA1 mutation (+) -Due to her strong family history of ovarian cancer and  also positive family history of breast cancer, she underwent genetic testing - Patient is positive for BRCA1 Mutation -We previously discussed the high risk of breast cancer and colon cancer due to the BRCA1 mutation. She is status post bilateral mastectomy. She is agreeable to have BSO, she has discussed with her gynecologist. I have recommended her to have this surgery done after she completes adjuvant chemotherapy. - Her sister has done genetic testing, results pending -She has no children.  3. 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 The  patient knows to call the clinic with any problems, questions or concerns. I spent 20 minutes counseling the patient face to face. The total time spent in the appointment was 25 minutes and more than 50% was on counseling.  This document serves as a record of services personally performed by Truitt Merle, MD. It was created on her behalf by Brandt Loosen, a trained medical scribe. The creation of this record is based on the scribe's personal observations and the provider's statements to them. This document has been checked and approved by the attending provider.    Truitt Merle, MD 06/30/2016

## 2016-06-30 ENCOUNTER — Ambulatory Visit: Payer: BLUE CROSS/BLUE SHIELD

## 2016-06-30 ENCOUNTER — Ambulatory Visit (HOSPITAL_BASED_OUTPATIENT_CLINIC_OR_DEPARTMENT_OTHER): Payer: BLUE CROSS/BLUE SHIELD | Admitting: Hematology

## 2016-06-30 ENCOUNTER — Telehealth: Payer: Self-pay | Admitting: Hematology

## 2016-06-30 ENCOUNTER — Other Ambulatory Visit (HOSPITAL_BASED_OUTPATIENT_CLINIC_OR_DEPARTMENT_OTHER): Payer: BLUE CROSS/BLUE SHIELD

## 2016-06-30 ENCOUNTER — Ambulatory Visit (HOSPITAL_BASED_OUTPATIENT_CLINIC_OR_DEPARTMENT_OTHER): Payer: BLUE CROSS/BLUE SHIELD

## 2016-06-30 VITALS — BP 127/83 | HR 72 | Temp 96.7°F | Resp 20 | Ht 64.0 in | Wt 152.3 lb

## 2016-06-30 DIAGNOSIS — C50111 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 female breast: Secondary | ICD-10-CM

## 2016-06-30 DIAGNOSIS — Z5111 Encounter for antineoplastic chemotherapy: Secondary | ICD-10-CM

## 2016-06-30 DIAGNOSIS — Z8041 Family history of malignant neoplasm of ovary: Secondary | ICD-10-CM | POA: Diagnosis not present

## 2016-06-30 DIAGNOSIS — Z803 Family history of malignant neoplasm of breast: Secondary | ICD-10-CM

## 2016-06-30 DIAGNOSIS — Z1509 Genetic susceptibility to other malignant neoplasm: Secondary | ICD-10-CM

## 2016-06-30 DIAGNOSIS — Z1501 Genetic susceptibility to malignant neoplasm of breast: Secondary | ICD-10-CM

## 2016-06-30 DIAGNOSIS — D6481 Anemia due to antineoplastic chemotherapy: Secondary | ICD-10-CM | POA: Diagnosis not present

## 2016-06-30 DIAGNOSIS — Z170 Estrogen receptor positive status [ER+]: Principal | ICD-10-CM

## 2016-06-30 DIAGNOSIS — I10 Essential (primary) hypertension: Secondary | ICD-10-CM

## 2016-06-30 DIAGNOSIS — Z95828 Presence of other vascular implants and grafts: Secondary | ICD-10-CM

## 2016-06-30 LAB — CBC WITH DIFFERENTIAL/PLATELET
BASO%: 0.6 % (ref 0.0–2.0)
Basophils Absolute: 0.1 10*3/uL (ref 0.0–0.1)
EOS ABS: 0 10*3/uL (ref 0.0–0.5)
EOS%: 0.2 % (ref 0.0–7.0)
HEMATOCRIT: 30.9 % — AB (ref 34.8–46.6)
HEMOGLOBIN: 10.3 g/dL — AB (ref 11.6–15.9)
LYMPH#: 1.6 10*3/uL (ref 0.9–3.3)
LYMPH%: 12.8 % — ABNORMAL LOW (ref 14.0–49.7)
MCH: 32.9 pg (ref 25.1–34.0)
MCHC: 33.5 g/dL (ref 31.5–36.0)
MCV: 98.3 fL (ref 79.5–101.0)
MONO#: 1.2 10*3/uL — ABNORMAL HIGH (ref 0.1–0.9)
MONO%: 9.7 % (ref 0.0–14.0)
NEUT#: 9.6 10*3/uL — ABNORMAL HIGH (ref 1.5–6.5)
NEUT%: 76.7 % (ref 38.4–76.8)
PLATELETS: 196 10*3/uL (ref 145–400)
RBC: 3.14 10*6/uL — ABNORMAL LOW (ref 3.70–5.45)
RDW: 16 % — AB (ref 11.2–14.5)
WBC: 12.6 10*3/uL — ABNORMAL HIGH (ref 3.9–10.3)

## 2016-06-30 LAB — COMPREHENSIVE METABOLIC PANEL
ALBUMIN: 3.8 g/dL (ref 3.5–5.0)
ALK PHOS: 139 U/L (ref 40–150)
ALT: 18 U/L (ref 0–55)
ANION GAP: 9 meq/L (ref 3–11)
AST: 18 U/L (ref 5–34)
BUN: 14.2 mg/dL (ref 7.0–26.0)
CALCIUM: 8.9 mg/dL (ref 8.4–10.4)
CO2: 24 mEq/L (ref 22–29)
Chloride: 106 mEq/L (ref 98–109)
Creatinine: 0.7 mg/dL (ref 0.6–1.1)
Glucose: 95 mg/dl (ref 70–140)
Potassium: 4.1 mEq/L (ref 3.5–5.1)
Sodium: 139 mEq/L (ref 136–145)
Total Bilirubin: <0.22 mg/dL (ref 0.20–1.20)
Total Protein: 6.2 g/dL — ABNORMAL LOW (ref 6.4–8.3)

## 2016-06-30 MED ORDER — SODIUM CHLORIDE 0.9% FLUSH
10.0000 mL | INTRAVENOUS | Status: DC | PRN
Start: 2016-06-30 — End: 2016-06-30
  Administered 2016-06-30: 10 mL
  Filled 2016-06-30: qty 10

## 2016-06-30 MED ORDER — SODIUM CHLORIDE 0.9% FLUSH
10.0000 mL | INTRAVENOUS | Status: DC | PRN
  Administered 2016-06-30: 10 mL via INTRAVENOUS
  Filled 2016-06-30: qty 10

## 2016-06-30 MED ORDER — SODIUM CHLORIDE 0.9 % IV SOLN
Freq: Once | INTRAVENOUS | Status: AC
  Administered 2016-06-30: 11:00:00 via INTRAVENOUS
  Filled 2016-06-30: qty 5

## 2016-06-30 MED ORDER — PEGFILGRASTIM 6 MG/0.6ML ~~LOC~~ PSKT
6.0000 mg | PREFILLED_SYRINGE | Freq: Once | SUBCUTANEOUS | Status: AC
  Administered 2016-06-30: 6 mg via SUBCUTANEOUS
  Filled 2016-06-30: qty 0.6

## 2016-06-30 MED ORDER — PALONOSETRON HCL INJECTION 0.25 MG/5ML
0.2500 mg | Freq: Once | INTRAVENOUS | Status: AC
  Administered 2016-06-30: 0.25 mg via INTRAVENOUS

## 2016-06-30 MED ORDER — PALONOSETRON HCL INJECTION 0.25 MG/5ML
INTRAVENOUS | Status: AC
  Filled 2016-06-30: qty 5

## 2016-06-30 MED ORDER — DOXORUBICIN HCL CHEMO IV INJECTION 2 MG/ML
60.0000 mg/m2 | Freq: Once | INTRAVENOUS | Status: AC
  Administered 2016-06-30: 106 mg via INTRAVENOUS
  Filled 2016-06-30: qty 53

## 2016-06-30 MED ORDER — HEPARIN SOD (PORK) LOCK FLUSH 100 UNIT/ML IV SOLN
500.0000 [IU] | Freq: Once | INTRAVENOUS | Status: AC | PRN
  Administered 2016-06-30: 500 [IU]
  Filled 2016-06-30: qty 5

## 2016-06-30 MED ORDER — SODIUM CHLORIDE 0.9 % IV SOLN
Freq: Once | INTRAVENOUS | Status: AC
  Administered 2016-06-30: 11:00:00 via INTRAVENOUS

## 2016-06-30 MED ORDER — CYCLOPHOSPHAMIDE CHEMO INJECTION 1 GM
600.0000 mg/m2 | Freq: Once | INTRAMUSCULAR | Status: AC
  Administered 2016-06-30: 1060 mg via INTRAVENOUS
  Filled 2016-06-30: qty 53

## 2016-06-30 NOTE — Telephone Encounter (Signed)
Gave patient AVS and calender per 5/24 LOS.  

## 2016-06-30 NOTE — Patient Instructions (Signed)
Implanted Port Home Guide An implanted port is a type of central line that is placed under the skin. Central lines are used to provide IV access when treatment or nutrition needs to be given through a person's veins. Implanted ports are used for long-term IV access. An implanted port may be placed because:  You need IV medicine that would be irritating to the small veins in your hands or arms.  You need long-term IV medicines, such as antibiotics.  You need IV nutrition for a long period.  You need frequent blood draws for lab tests.  You need dialysis.  Implanted ports are usually placed in the chest area, but they can also be placed in the upper arm, the abdomen, or the leg. An implanted port has two main parts:  Reservoir. The reservoir is round and will appear as a small, raised area under your skin. The reservoir is the part where a needle is inserted to give medicines or draw blood.  Catheter. The catheter is a thin, flexible tube that extends from the reservoir. The catheter is placed into a large vein. Medicine that is inserted into the reservoir goes into the catheter and then into the vein.  How will I care for my incision site? Do not get the incision site wet. Bathe or shower as directed by your health care provider. How is my port accessed? Special steps must be taken to access the port:  Before the port is accessed, a numbing cream can be placed on the skin. This helps numb the skin over the port site.  Your health care provider uses a sterile technique to access the port. ? Your health care provider must put on a mask and sterile gloves. ? The skin over your port is cleaned carefully with an antiseptic and allowed to dry. ? The port is gently pinched between sterile gloves, and a needle is inserted into the port.  Only "non-coring" port needles should be used to access the port. Once the port is accessed, a blood return should be checked. This helps ensure that the port  is in the vein and is not clogged.  If your port needs to remain accessed for a constant infusion, a clear (transparent) bandage will be placed over the needle site. The bandage and needle will need to be changed every week, or as directed by your health care provider.  Keep the bandage covering the needle clean and dry. Do not get it wet. Follow your health care provider's instructions on how to take a shower or bath while the port is accessed.  If your port does not need to stay accessed, no bandage is needed over the port.  What is flushing? Flushing helps keep the port from getting clogged. Follow your health care provider's instructions on how and when to flush the port. Ports are usually flushed with saline solution or a medicine called heparin. The need for flushing will depend on how the port is used.  If the port is used for intermittent medicines or blood draws, the port will need to be flushed: ? After medicines have been given. ? After blood has been drawn. ? As part of routine maintenance.  If a constant infusion is running, the port may not need to be flushed.  How long will my port stay implanted? The port can stay in for as long as your health care provider thinks it is needed. When it is time for the port to come out, surgery will be   done to remove it. The procedure is similar to the one performed when the port was put in. When should I seek immediate medical care? When you have an implanted port, you should seek immediate medical care if:  You notice a bad smell coming from the incision site.  You have swelling, redness, or drainage at the incision site.  You have more swelling or pain at the port site or the surrounding area.  You have a fever that is not controlled with medicine.  This information is not intended to replace advice given to you by your health care provider. Make sure you discuss any questions you have with your health care provider. Document  Released: 01/24/2005 Document Revised: 07/02/2015 Document Reviewed: 10/01/2012 Elsevier Interactive Patient Education  2017 [employer]  

## 2016-06-30 NOTE — Patient Instructions (Signed)
[facility] Discharge Instructions for Patients Receiving Chemotherapy  Today you received the following chemotherapy agents: adriamycin & cytoxan  To help prevent nausea and vomiting after your treatment, we encourage you to take your nausea medication as prescribed by your physician.   If you develop nausea and vomiting that is not controlled by your nausea medication, call the clinic.   BELOW ARE SYMPTOMS THAT SHOULD BE REPORTED IMMEDIATELY:  *FEVER GREATER THAN 100.5 F  *CHILLS WITH OR WITHOUT FEVER  NAUSEA AND VOMITING THAT IS NOT CONTROLLED WITH YOUR NAUSEA MEDICATION  *UNUSUAL SHORTNESS OF BREATH  *UNUSUAL BRUISING OR BLEEDING  TENDERNESS IN MOUTH AND THROAT WITH OR WITHOUT PRESENCE OF ULCERS  *URINARY PROBLEMS  *BOWEL PROBLEMS  UNUSUAL RASH Items with * indicate a potential emergency and should be followed up as soon as possible.  Feel free to call the clinic you have any questions or concerns. The clinic phone number is [phone].  Please show the [facility] at check-in to the Emergency Department and triage nurse.

## 2016-07-02 ENCOUNTER — Encounter: Payer: Self-pay | Admitting: Hematology

## 2016-07-05 DIAGNOSIS — C50411 Malignant neoplasm of upper-outer quadrant of right female breast: Secondary | ICD-10-CM | POA: Diagnosis not present

## 2016-07-05 DIAGNOSIS — Z9012 Acquired absence of left breast and nipple: Secondary | ICD-10-CM | POA: Diagnosis not present

## 2016-07-08 NOTE — Progress Notes (Signed)
[place]  [phone] [phone]  Clinic Follow Up Note   Patient Care Team: [name], MD as PCP - General (Internal Medicine) Verl Blalock, Marijo Conception, MD (Inactive) as Consulting Physician (Cardiology) [name], MD as Consulting Physician (Dermatology) 07/14/2016    CHIEF COMPLAINTS:  Follow up right breast cancer  Oncology History   Cancer Staging 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 breast in female, estrogen receptor positive ([place]) Staging form: Breast, AJCC 8th Edition - Clinical stage from 03/08/2016: Stage IA (cT1c, cN0, cM0, G2, ER: Positive, PR: Negative, HER2: Negative) - Signed by Truitt Merle, MD on 03/15/2016 - Pathologic stage from 04/12/2016: Stage IA (pT1c, pN0, cM0, G2, ER: Positive, PR: Negative, HER2: Negative, Oncotype DX score: 49) - Signed by Truitt Merle, MD on 05/05/2016       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 breast in female, estrogen receptor positive ([place])   03/04/2016 Mammogram    Diagnostic bilateral mammogram and right breast ultrasound showed a 1.7 cm mass in the 12:00 position of the right breast, highly suspicious for malignancy. There is a borderline abnormal right axillary lymph node, also suspicious for metastasis.       03/08/2016 Initial Diagnosis    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 breast in female, estrogen receptor positive ([place])      03/08/2016 Initial Biopsy    Right breast 12:00 core needle biopsy showed invasive ductal carcinoma, grade 2, right axillary lymph node biopsy was negative.      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  Genes Analyzed: 43 genes on [facility]'s Common Cancers panel (APC, ATM, AXIN2, BARD1, BMPR1A, BRCA1, BRCA2, BRIP1, CDH1, CDKN2A, CHEK2, DICER1, EPCAM, GREM1, HOXB13, KIT, MEN1, MLH1, MSH2, MSH6,  MUTYH, NBN, NF1, PALB2, PDGFRA, PMS2, POLD1, POLE, PTEN, RAD50, RAD51C, RAD51D, SDHA, SDHB, SDHC, SDHD, SMAD4, SMARCA4, STK11, TP53, TSC1, TSC2, VHL).       04/12/2016 Surgery    Bilateral mastectomy and right sentinel lymph node biopsy, by [name]      04/12/2016 Pathology Results    Right breast mastectomy showed invasive grade 2 invasive ductal carcinoma, 1.7 cm, margins were negative, 3 sentinel lymph nodes were negative, left simple mastectomy fibroadenoma, one benign node, no atypia or tumor.       05/04/2016 Oncotype testing    Recurrence score 49, high-risk, predicts 10 year risk of distant recurrence 32% with tamoxifen alone.      05/10/2016 Echocardiogram    Echo on 05/10/16 showed EF 55-60%.      05/20/2016 -  Chemotherapy    dose dense Adriamycin and Cytoxan, every 2 weeks starting 05/20/16 for 4 cycles, followed by Taxol starting 07/15/16 (weekly for 12 weeks or biweekly for 4 cycles) ending 09/30/16.       Breast cancer, right ([place])   04/12/2016 Initial Diagnosis    Breast cancer, right ([place])      HISTORY OF PRESENTING ILLNESS (03/16/16):  Chelsea Salinas 61 y.o. female is here because of recently diagnosed right breast invasive mammary carcinoma. She is accompanied by her husband to our multidisciplinary breast clinic today.  The patient underwent routine screening mammogram on 02/29/2016 and was found to have possible architectural distortion and asymmetry in the right breast. Also found was asymmetry in the left breast.  Accordingly a bilateral diagnostic mammogram and unilateral  right breast ultrasound were performed on 03/04/2016. These scans showed a 1.7 cm mass in the 12 o'clock position of the right breast with imaging features highly suspicious for malignancy. Also seen was borderline abnormal right axillary lymph node, suspicious for a metastatic node.  Biopsy of the right breast 12:00 o'clock position on 03/08/2016 revealed invasive mammary carcinoma. The carcinoma  appears to be at least grade 2, ER positive, PR negative, HER-2 negative, Ki67 20%. The malignant cells are positive for E-Cadherin, supporting a ductal phenotype. Biopsy of the right axillary lymph node showed no evidence of carcinoma.   The patient reports to the clinic today for follow up and to discuss chemotherapy. She has had surgery about 3 weeks ago and she has been doing well. She started antibiotics yesterday to help with infection that she has recently gotten. She has also started doing PT this week. She decided not to do reconstruction. She has been eating well.    GYN HISTORY  Menarchal:12 LMP: late 59's (hot flushes since mid 40's)  Contraceptive: 23 HRT: 3-4 years  G0P0:  CURRENT THERAPY: dose dense Adriamycin and Cytoxan, every 2 weeks starting 05/20/16 for 4 cycles, followed by weekly Taxol for 12 weeks starting 07/14/16   INTERVAL HISTORY:  KRISTYANNA BARCELO returns for follow up and reports things are going well. She is accompanied by her friend. She has been experiencing slight fatigue. She does not have to take any of her nausea medication  MEDICAL HISTORY:  Past Medical History:  Diagnosis Date  . Abnormal Pap smear of cervix    --in her 20s had colpo and some type of treatment to cervix.  . Allergy   . Asthma    "all the time, throughout the yr"  . 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 Breast cancer, right breast ([place]) 02/2016  . Eczema   . Family history of breast cancer   . Family history of genetic disease carrier    paternal relatives with a BRCA mutation  . Family history of ovarian cancer   . Hypertension   . Pneumonia    2000ish  . STD (sexually transmitted disease)    HSV II    SURGICAL HISTORY: Past Surgical History:  Procedure Laterality Date  . BREAST BIOPSY Right 04/13/2016   Procedure: Evacuation RIGHT Breast  Hematoma;  Surgeon: [name], MD;  Location: [place];  Service: General;  Laterality: Right;  .  COLONOSCOPY    . DILATION AND CURETTAGE OF UTERUS  2008  . MASTECTOMY Left 04/12/2016   prophylactic total mastectomy  . MASTECTOMY COMPLETE / SIMPLE W/ SENTINEL NODE BIOPSY Right 04/12/2016   axillary sentinel LNB  . MASTECTOMY W/ SENTINEL NODE BIOPSY Bilateral 04/12/2016   Procedure: BILATERAL TOTAL MASTECTOMIES  WITH RIGHT SENTINEL LYMPH NODE BIOPSY;  Surgeon: [name], MD;  Location: [place];  Service: General;  Laterality: Bilateral;  . PORTACATH PLACEMENT Right 05/19/2016   Procedure: INSERTION PORT-A-CATH WITH [place];  Surgeon: [name], MD;  Location: [place];  Service: General;  Laterality: Right;    SOCIAL HISTORY: Social History   Social History  . Marital status: Married    Spouse name: N/A  . Number of children: N/A  . Years of education: N/A   Occupational History  . Not on file.   Social History Main Topics  . Smoking status: Former Smoker    Packs/day: 1.00    Years: 25.00    Quit date: 02/08/1996  .  Smokeless tobacco: Never Used  . Alcohol use 13.2 oz/week    15 Standard drinks or equivalent, 7 Glasses of wine per week  . Drug use: No  . Sexual activity: No   Other Topics Concern  . Not on file   Social History Narrative  . No narrative on file    FAMILY HISTORY: Family History  Problem Relation Age of Onset  . Polymyalgia rheumatica Mother   . Hypertension Father   . Heart disease Father        Dec 69  . Heart attack Father   . Prostate cancer Father        Dx 6s; deceased 52  . Breast cancer Cousin        Dx 14s; daughter of a paternal uncle  . Ovarian cancer Paternal Aunt        Dx 65; deceased  . Ovarian cancer Cousin        Dx 70s; daughter of a paternal uncle  . Ovarian cancer Cousin        Dx 56; daughter of a paternal uncle  . Ovarian cancer Paternal Aunt        Dx 56s.  BRCA positive  . Ovarian cancer Other        pat grandfather's mother    ALLERGIES:  has No Known Allergies.  MEDICATIONS:  Current Outpatient  Prescriptions  Medication Sig Dispense Refill  . acetaminophen (TYLENOL) 500 MG tablet Take 500-1,000 mg by mouth every 6 (six) hours as needed for mild pain or headache (depends on pain if takes 1-2 tablets).    Marland Kitchen acyclovir ointment (ZOVIRAX) 5 % Apply 1 application topically every 3 (three) hours. 15 g 1  . aspirin EC 81 MG tablet Take 81 mg by mouth daily.    . cetirizine (ZYRTEC) 10 MG tablet Take 10 mg by mouth at bedtime.     . Cholecalciferol (VITAMIN D) 2000 units tablet Take 2,000 Units by mouth daily.    . cloNIDine (CATAPRES) 0.2 MG tablet Take 1 tablet (0.2 mg total) by mouth at bedtime. 90 tablet 0  . escitalopram (LEXAPRO) 10 MG tablet Take 1 tablet by mouth daily at 12 noon.   0  . Flaxseed, Linseed, (FLAXSEED OIL PO) Take 1,400 mg by mouth every evening.    . fluticasone (FLONASE ALLERGY RELIEF) 50 MCG/ACT nasal spray Place 1 spray into both nostrils daily.    Marland Kitchen lidocaine-prilocaine (EMLA) cream Apply 1 application topically as needed. 30 g 3  . losartan (COZAAR) 100 MG tablet Take 1 tablet (100 mg total) by mouth daily. 30 tablet 11  . magnesium oxide (MAG-OX) 400 MG tablet Take 400 mg by mouth every other day.    . montelukast (SINGULAIR) 10 MG tablet TAKE 1 TABLET(10 MG) BY MOUTH DAILY (Patient taking differently: TAKE 1 TABLET BY MOUTH AS NEEDED FOR SHORTNESS OF BREATH) 90 tablet 0  . Multiple Vitamins-Minerals (MULTIVITAMIN WITH MINERALS) tablet Take 1 tablet by mouth every evening.     . Omega-3 Fatty Acids ([employer] FISH OIL) 1200 MG CAPS Take 1,200 mg by mouth every evening.     . ondansetron (ZOFRAN) 8 MG tablet Take 1 tablet (8 mg total) by mouth 2 (two) times daily as needed. Start on the third day after chemotherapy. 30 tablet 1  . prochlorperazine (COMPAZINE) 10 MG tablet Take 1 tablet (10 mg total) by mouth every 6 (six) hours as needed (Nausea or vomiting). 30 tablet 1  . valACYclovir (VALTREX) 500 MG tablet  Take 1 tablet (500 mg total) by mouth 2 (two) times daily. Take  for 3 days as needed. 30 tablet 1  . venlafaxine XR (EFFEXOR-XR) 75 MG 24 hr capsule TAKE 2 CAPSULES BY MOUTH EVERY MORNING AND 1 CAPSULE EVERY EVENING 270 capsule 0  . vitamin B-12 (CYANOCOBALAMIN) 100 MCG tablet Take 100 mcg by mouth every other day.      No current facility-administered medications for this visit.    Facility-Administered Medications Ordered in Other Visits  Medication Dose Route Frequency Provider Last Rate Last Dose  . sodium chloride flush (NS) 0.9 % injection 10 mL  10 mL Intravenous PRN Truitt Merle, MD   10 mL at 06/30/16 0844    REVIEW OF SYSTEMS:   Constitutional: Denies fevers, chills or abnormal night sweats  (+) fatigue Eyes: Denies blurriness of vision, double vision or watery eyes Ears, nose, mouth, throat, and face: Denies mucositis or sore throat Respiratory: Denies cough, dyspnea or wheezes Cardiovascular: Denies palpitation, chest discomfort or lower extremity swelling Gastrointestinal:  Denies heartburn  Skin: No skin rashes or lesions. (+)cold sores Lymphatics: Denies new lymphadenopathy or easy bruising Neurological:Denies numbness, tingling or new weaknesses Behavioral/Psych: Mood is stable, no new changes  All other systems were reviewed with the patient and are negative.  PHYSICAL EXAMINATION:  ECOG PERFORMANCE STATUS: 0 - Asymptomatic  Vitals:   07/14/16 0936  BP: (!) 144/82  Pulse: 77  Resp: 18  Temp: 97.8 F (36.6 C)  TempSrc: Oral  SpO2: 100%  Weight: 151 lb 6.4 oz (68.7 kg)  Height: 5' 4"  (1.626 m)     GENERAL:alert, no distress and comfortable SKIN: skin color, texture, turgor are normal, no rashes or significant lesions, Dryness pof skin and color changes to nail nail bed. EYES: normal, conjunctiva are pink and non-injected, sclera clear OROPHARYNX:no exudate, no erythema and lips, buccal mucosa, and tongue normal, no oral thrush NECK: supple, thyroid normal size, non-tender, without nodularity LYMPH:  no palpable  lymphadenopathy in the cervical, axillary or inguinal LUNGS: clear to auscultation and percussion with normal breathing effort HEART: regular rate & rhythm and no murmurs and no lower extremity edema ABDOMEN:abdomen soft, non-tender and normal bowel sounds Musculoskeletal:no cyanosis of digits and no clubbing  PSYCH: alert & oriented x 3 with fluent speech NEURO: no focal motor/sensory deficits Breasts: Breast inspection showed Status post bilateral mastectomy, surgical incisions have healed well with no discharge or surrounding skin Erythema.   LABORATORY DATA:  I have reviewed the data as listed CBC Latest Ref Rng & Units 07/14/2016 06/30/2016 06/16/2016  WBC 3.9 - 10.3 10e3/uL 10.2 12.6(H) 10.0  Hemoglobin 11.6 - 15.9 g/dL 10.3(L) 10.3(L) 10.5(L)  Hematocrit 34.8 - 46.6 % 30.6(L) 30.9(L) 31.9(L)  Platelets 145 - 400 10e3/uL 162 196 207    CMP Latest Ref Rng & Units 07/14/2016 06/30/2016 06/16/2016  Glucose 70 - 140 mg/dl 95 95 99  BUN 7.0 - 26.0 mg/dL 11.2 14.2 10.1  Creatinine 0.6 - 1.1 mg/dL 0.7 0.7 0.6  Sodium 136 - 145 mEq/L 144 139 141  Potassium 3.5 - 5.1 mEq/L 4.3 4.1 4.1  Chloride 101 - 111 mmol/L - - -  CO2 22 - 29 mEq/L 26 24 25   Calcium 8.4 - 10.4 mg/dL 9.3 8.9 9.1  Total Protein 6.4 - 8.3 g/dL 6.4 6.2(L) 6.6  Total Bilirubin 0.20 - 1.20 mg/dL 0.22 <0.22 <0.22  Alkaline Phos 40 - 150 U/L 124 139 127  AST 5 - 34 U/L 19 18 20   ALT  0 - 55 U/L 17 18 20     PATHOLOGY  Diagnosis 04/12/16 1. Breast, simple mastectomy, Left Prophylactic Total - BREAST PARENCHYMA SHOWING FIBROCYSTIC CHANGES WITH APOCRINE METAPLASIA AND FLORID USUAL DUCTAL HYPERPLASIA. - FIBROADENOMA. - ONE BENIGN LYMPH NODE (0/1). - NO ATYPIA OR TUMOR SEEN. - GROSSLY UNREMARKABLE SKIN. 2. Breast, simple mastectomy, Right Total - INVASIVE, GRADE 2 DUCTAL CARCINOMA, SPANNING 1.7 CM IN GREATEST DIMENSION. - MARGINS ARE NEGATIVE. - GROSSLY UNREMARKABLE SKIN. - SEE ONCOLOGY TEMPLATE. 3. Lymph node, sentinel,  biopsy, Right Axillary #1 - ONE BENIGN WITH NO TUMOR SEEN (0/1). - SEE COMMENT. 4. Lymph node, sentinel, biopsy, Right - ONE BENIGN WITH NO TUMOR SEEN (0/1). - SEE COMMENT. 5. Lymph node, sentinel, biopsy, Right - ONE BENIGN WITH NO TUMOR SEEN (0/1). - SEE COMMENT.  Diagnosis 03/08/2016 1. Breast, right, needle core biopsy, 12:00 o'clock - INVASIVE MAMMARY CARCINOMA. - SEE COMMENT. 2. Lymph node, needle/core biopsy, right axilla - THERE IS NO EVIDENCE OF CARCINOMA IN 1 OF 1 LYMPH NODE (0/1).   RADIOGRAPHIC STUDIES: I have personally reviewed the radiological images as listed and agreed with the findings in the report. No results found.   ECHO 05/10/16 Study Conclusions - Left ventricle: The cavity size was normal. Systolic function was   normal. The estimated ejection fraction was in the range of 55%   to 60%. Wall motion was normal; there were no regional wall   motion abnormalities. Doppler parameters are consistent with   abnormal left ventricular relaxation (grade 1 diastolic   dysfunction). There was no evidence of elevated ventricular   filling pressure by Doppler parameters. - Aortic valve: Trileaflet; normal thickness leaflets. There was no   regurgitation. - Aortic root: The aortic root was normal in size. - Mitral valve: Structurally normal valve. There was no   regurgitation. - Right ventricle: The cavity size was normal. Wall thickness was   normal. Systolic function was normal. - Tricuspid valve: There was trivial regurgitation. - Pulmonary arteries: Systolic pressure was within the normal   range. - Inferior vena cava: The vessel was normal in size. - Pericardium, extracardiac: There was no pericardial effusion.   ASSESSMENT & PLAN:  61 y.o. postmenopausal woman, presented with screening discovered right breast cancer  1. Malignant neoplasm of central portion of  right breast, invasive ductal carcinoma, G2, Stage IA, pT1cN0M0 ER weakly positive, PR and HER-2  negative. Oncotype RS 49, high risk  - Patient has had bilateral total mastectomy on 04/12/16 and denied reconstruction.  -I previously reviewed her surgical pathology findings with patient and her husband in details. Her surgical margins were negative, 3 sentinel lymph nodes were also negative. - Her oncotype is back at 49. This is fairly high risk disease based on the RS. the estimated 10 year risk of distant recurrence with tamoxifen alone is 32%. I previously, strongly recommend chemotherapy to reduce her risk of cancer recurrence after surgery (by ~50%). -she has started adjuvant chemotherapy with dose dense Adriamycin and Cytoxan, followed by Taxol - Baseline Echo on 05/10/16 showed EF 55-60%. -she has been tolerating chemo well, mild fatigue, no other significant side effects  - lab reviewed. She is adequate for treatment -she will start  weeklyTaxol today. We again discussed the side effects of Taxol. If side effects worsen, we will see her more often -  f/u every 2-3 weeks - I will reduce her steroid dosage to 10 mg  - I encouraged her to continue taking multivitamins  2. BRCA1 mutation (+) -  Due to her strong family history of ovarian cancer and also positive family history of breast cancer, she underwent genetic testing - Patient is positive for BRCA1 Mutation -We previously discussed the high risk of breast cancer and colon cancer due to the BRCA1 mutation. She is status post bilateral mastectomy. She is agreeable to have BSO, she has discussed with her gynecologist. I have recommended her to have this surgery done after she completes adjuvant chemotherapy. - Her sister has done genetic testing, results pending -She has no children.  3. 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 The patient  knows to call the clinic with any problems, questions or concerns. I spent 20 minutes counseling the patient face to face. The total time spent in the appointment was 25 minutes and more than 50% was on counseling.  This document serves as a record of services personally performed by Truitt Merle, MD. It was created on her behalf by Brandt Loosen, a trained medical scribe. The creation of this record is based on the scribe's personal observations and the provider's statements to them. This document has been checked and approved by the attending provider.    Truitt Merle, MD 07/14/2016

## 2016-07-09 NOTE — Addendum Note (Signed)
Addendum  created 07/09/16 1034 by Duane Boston, MD   Sign clinical note

## 2016-07-11 NOTE — Progress Notes (Signed)
[facility]  [phone] [phone]  Clinic Follow Up Note   Patient Care Team: [name], MD as PCP - General (Internal Medicine) Verl Blalock, Marijo Conception, MD (Inactive) as Consulting Physician (Cardiology) [name], MD as Consulting Physician (Dermatology) 07/21/2016    CHIEF COMPLAINTS:  Follow up right breast cancer  Oncology History   Cancer Staging 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 breast in female, estrogen receptor positive ([place]) Staging form: Breast, AJCC 8th Edition - Clinical stage from 03/08/2016: Stage IA (cT1c, cN0, cM0, G2, ER: Positive, PR: Negative, HER2: Negative) - Signed by Truitt Merle, MD on 03/15/2016 - Pathologic stage from 04/12/2016: Stage IA (pT1c, pN0, cM0, G2, ER: Positive, PR: Negative, HER2: Negative, Oncotype DX score: 49) - Signed by Truitt Merle, MD on 05/05/2016       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 breast in female, estrogen receptor positive ([place])   03/04/2016 Mammogram    Diagnostic bilateral mammogram and right breast ultrasound showed a 1.7 cm mass in the 12:00 position of the right breast, highly suspicious for malignancy. There is a borderline abnormal right axillary lymph node, also suspicious for metastasis.       03/08/2016 Initial Diagnosis    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 breast in female, estrogen receptor positive ([place])      03/08/2016 Initial Biopsy    Right breast 12:00 core needle biopsy showed invasive ductal carcinoma, grade 2, right axillary lymph node biopsy was negative.      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  Genes Analyzed: 43 genes on [facility]'s Common Cancers panel (APC, ATM, AXIN2, BARD1, BMPR1A, BRCA1, BRCA2, BRIP1, CDH1, CDKN2A, CHEK2, DICER1, EPCAM, GREM1, HOXB13, KIT, MEN1, MLH1, MSH2, MSH6,  MUTYH, NBN, NF1, PALB2, PDGFRA, PMS2, POLD1, POLE, PTEN, RAD50, RAD51C, RAD51D, SDHA, SDHB, SDHC, SDHD, SMAD4, SMARCA4, STK11, TP53, TSC1, TSC2, VHL).       04/12/2016 Surgery    Bilateral mastectomy and right sentinel lymph node biopsy, by [name]      04/12/2016 Pathology Results    Right breast mastectomy showed invasive grade 2 invasive ductal carcinoma, 1.7 cm, margins were negative, 3 sentinel lymph nodes were negative, left simple mastectomy fibroadenoma, one benign node, no atypia or tumor.       05/04/2016 Oncotype testing    Recurrence score 49, high-risk, predicts 10 year risk of distant recurrence 32% with tamoxifen alone.      05/10/2016 Echocardiogram    Echo on 05/10/16 showed EF 55-60%.      05/20/2016 -  Chemotherapy    dose dense Adriamycin and Cytoxan, every 2 weeks starting 05/20/16 for 4 cycles, followed by Taxol starting 07/14/16 (weekly for 12 weeks or biweekly for 4 cycles) ending 09/30/16.       Breast cancer, right ([place])   04/12/2016 Initial Diagnosis    Breast cancer, right ([place])      HISTORY OF PRESENTING ILLNESS (03/16/16):  Chelsea Salinas 61 y.o. female is here because of recently diagnosed right breast invasive mammary carcinoma. She is accompanied by her husband to our multidisciplinary breast clinic today.  The patient underwent routine screening mammogram on 02/29/2016 and was found to have possible architectural distortion and asymmetry in the right breast. Also found was asymmetry in the left breast.  Accordingly a bilateral diagnostic mammogram and unilateral  right breast ultrasound were performed on 03/04/2016. These scans showed a 1.7 cm mass in the 12 o'clock position of the right breast with imaging features highly suspicious for malignancy. Also seen was borderline abnormal right axillary lymph node, suspicious for a metastatic node.  Biopsy of the right breast 12:00 o'clock position on 03/08/2016 revealed invasive mammary carcinoma. The carcinoma  appears to be at least grade 2, ER positive, PR negative, HER-2 negative, Ki67 20%. The malignant cells are positive for E-Cadherin, supporting a ductal phenotype. Biopsy of the right axillary lymph node showed no evidence of carcinoma.   The patient reports to the clinic today for follow up and to discuss chemotherapy. She has had surgery about 3 weeks ago and she has been doing well. She started antibiotics yesterday to help with infection that she has recently gotten. She has also started doing PT this week. She decided not to do reconstruction. She has been eating well.    GYN HISTORY  Menarchal:12 LMP: late 38's (hot flushes since mid 40's)  Contraceptive: 23 HRT: 3-4 years  G0P0:  CURRENT THERAPY: dose dense Adriamycin and Cytoxan, every 2 weeks starting 05/20/16 for 4 cycles, followed by weekly Taxol for 12 weeks starting 07/14/16   INTERVAL HISTORY:   Chelsea Salinas returns for follow up and weekly Taxol. She presents to the clinic today with a friend. She reports doing well with Taxol and has a rash but that is related to her adhesive port covering. She denies swelling on legs or stomach issues She overall feels well.    MEDICAL HISTORY:  Past Medical History:  Diagnosis Date  . Abnormal Pap smear of cervix    --in her 20s had colpo and some type of treatment to cervix.  . Allergy   . Asthma    "all the time, throughout the yr"  . 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 Breast cancer, right breast ([place]) 02/2016  . Eczema   . Family history of breast cancer   . Family history of genetic disease carrier    paternal relatives with a BRCA mutation  . Family history of ovarian cancer   . Hypertension   . Pneumonia    2000ish  . STD (sexually transmitted disease)    HSV II    SURGICAL HISTORY: Past Surgical History:  Procedure Laterality Date  . BREAST BIOPSY Right 04/13/2016   Procedure: Evacuation RIGHT Breast  Hematoma;  Surgeon: [name], MD;  Location: [facility];  Service: General;  Laterality: Right;  . COLONOSCOPY    . DILATION AND CURETTAGE OF UTERUS  2008  . MASTECTOMY Left 04/12/2016   prophylactic total mastectomy  . MASTECTOMY COMPLETE / SIMPLE W/ SENTINEL NODE BIOPSY Right 04/12/2016   axillary sentinel LNB  . MASTECTOMY W/ SENTINEL NODE BIOPSY Bilateral 04/12/2016   Procedure: BILATERAL TOTAL MASTECTOMIES  WITH RIGHT SENTINEL LYMPH NODE BIOPSY;  Surgeon: [name], MD;  Location: [facility];  Service: General;  Laterality: Bilateral;  . PORTACATH PLACEMENT Right 05/19/2016   Procedure: INSERTION PORT-A-CATH WITH [place];  Surgeon: [name], MD;  Location: [facility];  Service: General;  Laterality: Right;    SOCIAL HISTORY: Social History   Social History  . Marital status: Married    Spouse name: N/A  . Number of children: N/A  . Years of education: N/A   Occupational History  . Not on file.   Social History Main Topics  . Smoking status: Former Smoker  Packs/day: 1.00    Years: 25.00    Quit date: 02/08/1996  . Smokeless tobacco: Never Used  . Alcohol use 13.2 oz/week    15 Standard drinks or equivalent, 7 Glasses of wine per week  . Drug use: No  . Sexual activity: No   Other Topics Concern  . Not on file   Social History Narrative  . No narrative on file    FAMILY HISTORY: Family History  Problem Relation Age of Onset  . Polymyalgia rheumatica Mother   . Hypertension Father   . Heart disease Father        Dec 69  . Heart attack Father   . Prostate cancer Father        Dx 7s; deceased 2  . Breast cancer Cousin        Dx 40s; daughter of a paternal uncle  . Ovarian cancer Paternal Aunt        Dx 56; deceased  . Ovarian cancer Cousin        Dx 12s; daughter of a paternal uncle  . Ovarian cancer Cousin        Dx 11; daughter of a paternal uncle  . Ovarian cancer Paternal Aunt        Dx 52s.  BRCA positive  . Ovarian cancer Other        pat grandfather's mother     ALLERGIES:  has No Known Allergies.  MEDICATIONS:  Current Outpatient Prescriptions  Medication Sig Dispense Refill  . acetaminophen (TYLENOL) 500 MG tablet Take 500-1,000 mg by mouth every 6 (six) hours as needed for mild pain or headache (depends on pain if takes 1-2 tablets).    Marland Kitchen acyclovir ointment (ZOVIRAX) 5 % Apply 1 application topically every 3 (three) hours. 15 g 1  . aspirin EC 81 MG tablet Take 81 mg by mouth daily.    . cetirizine (ZYRTEC) 10 MG tablet Take 10 mg by mouth at bedtime.     . Cholecalciferol (VITAMIN D) 2000 units tablet Take 2,000 Units by mouth daily.    . cloNIDine (CATAPRES) 0.2 MG tablet Take 1 tablet (0.2 mg total) by mouth at bedtime. 90 tablet 0  . escitalopram (LEXAPRO) 10 MG tablet TAKE 1/2 TO 1 TABLET BY MOUTH EVERY DAY AS NEEDED FOR HOT FLASHES 90 tablet 1  . Flaxseed, Linseed, (FLAXSEED OIL PO) Take 1,400 mg by mouth every evening.    . fluticasone (FLONASE ALLERGY RELIEF) 50 MCG/ACT nasal spray Place 1 spray into both nostrils daily.    Marland Kitchen lidocaine-prilocaine (EMLA) cream Apply 1 application topically as needed. 30 g 3  . losartan (COZAAR) 100 MG tablet Take 1 tablet (100 mg total) by mouth daily. 30 tablet 11  . magnesium oxide (MAG-OX) 400 MG tablet Take 400 mg by mouth every other day.    . montelukast (SINGULAIR) 10 MG tablet TAKE 1 TABLET(10 MG) BY MOUTH DAILY (Patient taking differently: TAKE 1 TABLET BY MOUTH AS NEEDED FOR SHORTNESS OF BREATH) 90 tablet 0  . Multiple Vitamins-Minerals (MULTIVITAMIN WITH MINERALS) tablet Take 1 tablet by mouth every evening.     . Omega-3 Fatty Acids ([employer] FISH OIL) 1200 MG CAPS Take 1,200 mg by mouth every evening.     . ondansetron (ZOFRAN) 8 MG tablet Take 1 tablet (8 mg total) by mouth 2 (two) times daily as needed. Start on the third day after chemotherapy. 30 tablet 1  . prochlorperazine (COMPAZINE) 10 MG tablet Take 1 tablet (10 mg total) by  mouth every 6 (six) hours as needed (Nausea or vomiting). 30  tablet 1  . valACYclovir (VALTREX) 500 MG tablet Take 1 tablet (500 mg total) by mouth 2 (two) times daily. Take for 3 days as needed. 30 tablet 1  . venlafaxine XR (EFFEXOR-XR) 75 MG 24 hr capsule TAKE 2 CAPSULES BY MOUTH EVERY MORNING AND 1 CAPSULE EVERY EVENING 270 capsule 0  . vitamin B-12 (CYANOCOBALAMIN) 100 MCG tablet Take 100 mcg by mouth every other day.      No current facility-administered medications for this visit.    Facility-Administered Medications Ordered in Other Visits  Medication Dose Route Frequency Provider Last Rate Last Dose  . sodium chloride flush (NS) 0.9 % injection 10 mL  10 mL Intravenous PRN Truitt Merle, MD   10 mL at 06/30/16 0844    REVIEW OF SYSTEMS:   Constitutional: Denies fevers, chills or abnormal night sweats  (+) fatigue Eyes: Denies blurriness of vision, double vision or watery eyes Ears, nose, mouth, throat, and face: Denies mucositis or sore throat Respiratory: Denies cough, dyspnea or wheezes Cardiovascular: Denies palpitation, chest discomfort or lower extremity swelling Gastrointestinal:  Denies heartburn  Skin:  (+) rash from port adhesion covering Lymphatics: Denies new lymphadenopathy or easy bruising Neurological:Denies numbness, tingling or new weaknesses Behavioral/Psych: Mood is stable, no new changes  All other systems were reviewed with the patient and are negative.  PHYSICAL EXAMINATION:  ECOG PERFORMANCE STATUS: 0 - Asymptomatic  Vitals:   07/21/16 0857  BP: 124/76  Pulse: 98  Resp: 18  Temp: 97.7 F (36.5 C)  TempSrc: Oral  SpO2: 100%  Weight: 152 lb 14.4 oz (69.4 kg)  Height: 5' 4"  (1.626 m)     GENERAL:alert, no distress and comfortable SKIN: skin color, texture, turgor are normal, no rashes or significant lesions, Dryness pof skin and color changes to nail nail bed. EYES: normal, conjunctiva are pink and non-injected, sclera clear OROPHARYNX:no exudate, no erythema and lips, buccal mucosa, and tongue normal, no  oral thrush NECK: supple, thyroid normal size, non-tender, without nodularity LYMPH:  no palpable lymphadenopathy in the cervical, axillary or inguinal LUNGS: clear to auscultation and percussion with normal breathing effort HEART: regular rate & rhythm and no murmurs and no lower extremity edema ABDOMEN:abdomen soft, non-tender and normal bowel sounds Musculoskeletal:no cyanosis of digits and no clubbing  PSYCH: alert & oriented x 3 with fluent speech NEURO: no focal motor/sensory deficits Breasts: Breast inspection showed Status post bilateral mastectomy, surgical incisions have healed well with no discharge or surrounding skin Erythema.   LABORATORY DATA:  I have reviewed the data as listed CBC Latest Ref Rng & Units 07/21/2016 07/14/2016 06/30/2016  WBC 3.9 - 10.3 10e3/uL 3.2(L) 10.2 12.6(H)  Hemoglobin 11.6 - 15.9 g/dL 10.1(L) 10.3(L) 10.3(L)  Hematocrit 34.8 - 46.6 % 29.3(L) 30.6(L) 30.9(L)  Platelets 145 - 400 10e3/uL 304 162 196    CMP Latest Ref Rng & Units 07/21/2016 07/14/2016 06/30/2016  Glucose 70 - 140 mg/dl 88 95 95  BUN 7.0 - 26.0 mg/dL 12.1 11.2 14.2  Creatinine 0.6 - 1.1 mg/dL 0.7 0.7 0.7  Sodium 136 - 145 mEq/L 143 144 139  Potassium 3.5 - 5.1 mEq/L 3.9 4.3 4.1  Chloride 101 - 111 mmol/L - - -  CO2 22 - 29 mEq/L 27 26 24   Calcium 8.4 - 10.4 mg/dL 9.4 9.3 8.9  Total Protein 6.4 - 8.3 g/dL 6.4 6.4 6.2(L)  Total Bilirubin 0.20 - 1.20 mg/dL 0.22 0.22 <0.22  Alkaline  Phos 40 - 150 U/L 101 124 139  AST 5 - 34 U/L 19 19 18   ALT 0 - 55 U/L 15 17 18     PATHOLOGY  Diagnosis 04/12/16 1. Breast, simple mastectomy, Left Prophylactic Total - BREAST PARENCHYMA SHOWING FIBROCYSTIC CHANGES WITH APOCRINE METAPLASIA AND FLORID USUAL DUCTAL HYPERPLASIA. - FIBROADENOMA. - ONE BENIGN LYMPH NODE (0/1). - NO ATYPIA OR TUMOR SEEN. - GROSSLY UNREMARKABLE SKIN. 2. Breast, simple mastectomy, Right Total - INVASIVE, GRADE 2 DUCTAL CARCINOMA, SPANNING 1.7 CM IN GREATEST DIMENSION. -  MARGINS ARE NEGATIVE. - GROSSLY UNREMARKABLE SKIN. - SEE ONCOLOGY TEMPLATE. 3. Lymph node, sentinel, biopsy, Right Axillary #1 - ONE BENIGN WITH NO TUMOR SEEN (0/1). - SEE COMMENT. 4. Lymph node, sentinel, biopsy, Right - ONE BENIGN WITH NO TUMOR SEEN (0/1). - SEE COMMENT. 5. Lymph node, sentinel, biopsy, Right - ONE BENIGN WITH NO TUMOR SEEN (0/1). - SEE COMMENT.  Diagnosis 03/08/2016 1. Breast, right, needle core biopsy, 12:00 o'clock - INVASIVE MAMMARY CARCINOMA. - SEE COMMENT. 2. Lymph node, needle/core biopsy, right axilla - THERE IS NO EVIDENCE OF CARCINOMA IN 1 OF 1 LYMPH NODE (0/1).   PROCEDURES  ECHO 05/10/16 Study Conclusions - Left ventricle: The cavity size was normal. Systolic function was   normal. The estimated ejection fraction was in the range of 55%   to 60%. Wall motion was normal; there were no regional wall   motion abnormalities. Doppler parameters are consistent with   abnormal left ventricular relaxation (grade 1 diastolic   dysfunction). There was no evidence of elevated ventricular   filling pressure by Doppler parameters. - Aortic valve: Trileaflet; normal thickness leaflets. There was no   regurgitation. - Aortic root: The aortic root was normal in size. - Mitral valve: Structurally normal valve. There was no   regurgitation. - Right ventricle: The cavity size was normal. Wall thickness was   normal. Systolic function was normal. - Tricuspid valve: There was trivial regurgitation. - Pulmonary arteries: Systolic pressure was within the normal   range. - Inferior vena cava: The vessel was normal in size. - Pericardium, extracardiac: There was no pericardial effusion.   RADIOGRAPHIC STUDIES: I have personally reviewed the radiological images as listed and agreed with the findings in the report. No results found.    Bilateral mammogram and [place] 03/04/16 IMPRESSION: 1. 1.7 cm mass in the 12 o'clock position of the right breast with imaging  features highly suspicious for malignancy. 2. Borderline abnormal right axillary lymph node, suspicious for a metastatic node.  Screening mammogram 02/29/16 IMPRESSION: Further evaluation is suggested for possible architectural distortion and asymmetry in the right breast.  ASSESSMENT & PLAN:  61 y.o. postmenopausal woman, presented with screening discovered right breast cancer  1. 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 Oncotype RS 49, high risk  - Patient has had bilateral total mastectomy on 04/12/16 and denied reconstruction.  -I previously reviewed her surgical pathology findings with patient and her husband in details. Her surgical margins were negative, 3 sentinel lymph nodes were also negative. - Her oncotype is back at 49. This is fairly high risk disease based on the RS. the estimated 10 year risk of distant recurrence with tamoxifen alone is 32%. I previously, strongly recommend chemotherapy to reduce her risk of cancer recurrence after surgery (by ~50%). -she previously started adjuvant chemotherapy with dose dense Adriamycin and Cytoxan - Baseline Echo on 05/10/16 showed EF  55-60%. -she has previously been tolerating chemo well, mild fatigue, no other significant side effects  - lab reviewed. She is adequate for treatment -she weekly Taxol 07/14/16. We again discussed the side effects of Taxol. If side effects worsen, we will see her more often -  f/u every 2-3 weeks - I previously reduced her steroid dosage to 10 mg  - I previously encouraged her to continue taking multivitamins -Labs reviewed and adequate to continue with weekly taxol today, she is tolerating well   2. BRCA1 mutation (+) -Due to her strong family history of ovarian cancer and also positive family history of breast cancer, she underwent genetic testing - Patient is positive for BRCA1 Mutation -We previously  discussed the high risk of breast cancer and colon cancer due to the BRCA1 mutation. She is status post bilateral mastectomy. She is agreeable to have BSO, she has discussed with her gynecologist. I have recommended her to have this surgery done after she completes adjuvant chemotherapy. - Her sister has done genetic testing, results pending -She has no children.  3. 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 The patient knows to call the clinic with any problems, questions or concerns. I spent 15 minutes counseling the patient face to face. The total time spent in the appointment was 20 minutes and more than 50% was on counseling.  This document serves as a record of services personally performed by Truitt Merle, MD. It was created on her behalf by Joslyn Devon, a trained medical scribe. The creation of this record is based on the scribe's personal observations and the provider's statements to them. This document has been checked and approved by the attending provider.     Truitt Merle, MD 07/21/2016

## 2016-07-12 ENCOUNTER — Telehealth: Payer: Self-pay | Admitting: Obstetrics and Gynecology

## 2016-07-12 NOTE — Telephone Encounter (Signed)
Patient is calling to speak with Gay Filler. She states her last day of chemo is 09/30/16.

## 2016-07-12 NOTE — Telephone Encounter (Signed)
Return call to patient. Patient desires to proceed with BSO following chemo treatments.  This is her second rouud and she did really well the first time. She expects to be completed on 09-30-16. Discussed that frequently there is 4-6 week recovery following chemo to allow immune system time to strengthen following chemo.  Patient to check with oncologist regarding waiting period after chemo. Potential dates for October surgery discussed Advised patient we can proceed with consult with Dr Quincy Simmonds when she is ready.  Patient requests I confirm with Dr Quincy Simmonds if consult is necessary before scheduling Lap BSO.  Patient is agreeable to pre-op 3 weeks prior to surgery.  Last seen 04-29-16 for annual.   Routing to Dr Quincy Simmonds for review and instructions.

## 2016-07-13 NOTE — Telephone Encounter (Signed)
I do need oncology to clear her in order to proceed with surgery scheduling.   I would like to help her get her surgery done this year and understand her interest in October.

## 2016-07-14 ENCOUNTER — Encounter: Payer: Self-pay | Admitting: *Deleted

## 2016-07-14 ENCOUNTER — Other Ambulatory Visit (HOSPITAL_BASED_OUTPATIENT_CLINIC_OR_DEPARTMENT_OTHER): Payer: BLUE CROSS/BLUE SHIELD

## 2016-07-14 ENCOUNTER — Ambulatory Visit (HOSPITAL_BASED_OUTPATIENT_CLINIC_OR_DEPARTMENT_OTHER): Payer: BLUE CROSS/BLUE SHIELD | Admitting: Hematology

## 2016-07-14 ENCOUNTER — Ambulatory Visit: Payer: BLUE CROSS/BLUE SHIELD

## 2016-07-14 ENCOUNTER — Encounter: Payer: Self-pay | Admitting: Hematology

## 2016-07-14 ENCOUNTER — Ambulatory Visit (HOSPITAL_BASED_OUTPATIENT_CLINIC_OR_DEPARTMENT_OTHER): Payer: BLUE CROSS/BLUE SHIELD

## 2016-07-14 VITALS — BP 152/94 | HR 71 | Temp 98.1°F | Resp 18

## 2016-07-14 VITALS — BP 144/82 | HR 77 | Temp 97.8°F | Resp 18 | Ht 64.0 in | Wt 151.4 lb

## 2016-07-14 DIAGNOSIS — C50111 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 female breast: Secondary | ICD-10-CM

## 2016-07-14 DIAGNOSIS — Z5111 Encounter for antineoplastic chemotherapy: Secondary | ICD-10-CM

## 2016-07-14 DIAGNOSIS — I10 Essential (primary) hypertension: Secondary | ICD-10-CM

## 2016-07-14 DIAGNOSIS — D6481 Anemia due to antineoplastic chemotherapy: Secondary | ICD-10-CM | POA: Diagnosis not present

## 2016-07-14 DIAGNOSIS — R5383 Other fatigue: Secondary | ICD-10-CM | POA: Diagnosis not present

## 2016-07-14 DIAGNOSIS — Z170 Estrogen receptor positive status [ER+]: Principal | ICD-10-CM

## 2016-07-14 DIAGNOSIS — Z1501 Genetic susceptibility to malignant neoplasm of breast: Secondary | ICD-10-CM

## 2016-07-14 DIAGNOSIS — Z1509 Genetic susceptibility to other malignant neoplasm: Secondary | ICD-10-CM

## 2016-07-14 DIAGNOSIS — Z95828 Presence of other vascular implants and grafts: Secondary | ICD-10-CM

## 2016-07-14 LAB — COMPREHENSIVE METABOLIC PANEL
ALBUMIN: 4 g/dL (ref 3.5–5.0)
ALK PHOS: 124 U/L (ref 40–150)
ALT: 17 U/L (ref 0–55)
ANION GAP: 10 meq/L (ref 3–11)
AST: 19 U/L (ref 5–34)
BILIRUBIN TOTAL: 0.22 mg/dL (ref 0.20–1.20)
BUN: 11.2 mg/dL (ref 7.0–26.0)
CALCIUM: 9.3 mg/dL (ref 8.4–10.4)
CO2: 26 meq/L (ref 22–29)
Chloride: 107 mEq/L (ref 98–109)
Creatinine: 0.7 mg/dL (ref 0.6–1.1)
Glucose: 95 mg/dl (ref 70–140)
Potassium: 4.3 mEq/L (ref 3.5–5.1)
Sodium: 144 mEq/L (ref 136–145)
TOTAL PROTEIN: 6.4 g/dL (ref 6.4–8.3)

## 2016-07-14 LAB — CBC WITH DIFFERENTIAL/PLATELET
BASO%: 0.4 % (ref 0.0–2.0)
Basophils Absolute: 0 10*3/uL (ref 0.0–0.1)
EOS ABS: 0 10*3/uL (ref 0.0–0.5)
EOS%: 0.3 % (ref 0.0–7.0)
HCT: 30.6 % — ABNORMAL LOW (ref 34.8–46.6)
HGB: 10.3 g/dL — ABNORMAL LOW (ref 11.6–15.9)
LYMPH#: 1.3 10*3/uL (ref 0.9–3.3)
LYMPH%: 12.7 % — AB (ref 14.0–49.7)
MCH: 33.2 pg (ref 25.1–34.0)
MCHC: 33.6 g/dL (ref 31.5–36.0)
MCV: 98.8 fL (ref 79.5–101.0)
MONO#: 1 10*3/uL — ABNORMAL HIGH (ref 0.1–0.9)
MONO%: 9.9 % (ref 0.0–14.0)
NEUT%: 76.7 % (ref 38.4–76.8)
NEUTROS ABS: 7.9 10*3/uL — AB (ref 1.5–6.5)
PLATELETS: 162 10*3/uL (ref 145–400)
RBC: 3.1 10*6/uL — AB (ref 3.70–5.45)
RDW: 18.3 % — ABNORMAL HIGH (ref 11.2–14.5)
WBC: 10.2 10*3/uL (ref 3.9–10.3)

## 2016-07-14 MED ORDER — SODIUM CHLORIDE 0.9 % IV SOLN
Freq: Once | INTRAVENOUS | Status: AC
  Administered 2016-07-14: 10:00:00 via INTRAVENOUS

## 2016-07-14 MED ORDER — DEXAMETHASONE SODIUM PHOSPHATE 10 MG/ML IJ SOLN
INTRAMUSCULAR | Status: AC
  Filled 2016-07-14: qty 1

## 2016-07-14 MED ORDER — FAMOTIDINE IN NACL 20-0.9 MG/50ML-% IV SOLN
INTRAVENOUS | Status: AC
  Filled 2016-07-14: qty 50

## 2016-07-14 MED ORDER — SODIUM CHLORIDE 0.9% FLUSH
10.0000 mL | INTRAVENOUS | Status: DC | PRN
  Administered 2016-07-14: 10 mL via INTRAVENOUS
  Filled 2016-07-14: qty 10

## 2016-07-14 MED ORDER — DIPHENHYDRAMINE HCL 50 MG/ML IJ SOLN
25.0000 mg | Freq: Once | INTRAMUSCULAR | Status: AC
  Administered 2016-07-14: 25 mg via INTRAVENOUS

## 2016-07-14 MED ORDER — HEPARIN SOD (PORK) LOCK FLUSH 100 UNIT/ML IV SOLN
500.0000 [IU] | Freq: Once | INTRAVENOUS | Status: AC | PRN
  Administered 2016-07-14: 500 [IU]
  Filled 2016-07-14: qty 5

## 2016-07-14 MED ORDER — DEXAMETHASONE SODIUM PHOSPHATE 10 MG/ML IJ SOLN
10.0000 mg | Freq: Once | INTRAMUSCULAR | Status: AC
  Administered 2016-07-14: 10 mg via INTRAVENOUS

## 2016-07-14 MED ORDER — FAMOTIDINE IN NACL 20-0.9 MG/50ML-% IV SOLN
20.0000 mg | Freq: Once | INTRAVENOUS | Status: AC
  Administered 2016-07-14: 20 mg via INTRAVENOUS

## 2016-07-14 MED ORDER — ACYCLOVIR 5 % EX OINT: 1.0000 "application " | TOPICAL_OINTMENT | CUTANEOUS | 1 refills | Status: DC

## 2016-07-14 MED ORDER — SODIUM CHLORIDE 0.9% FLUSH
10.0000 mL | INTRAVENOUS | Status: DC | PRN
  Administered 2016-07-14: 10 mL
  Filled 2016-07-14: qty 10

## 2016-07-14 MED ORDER — DIPHENHYDRAMINE HCL 50 MG/ML IJ SOLN
INTRAMUSCULAR | Status: AC
  Filled 2016-07-14: qty 1

## 2016-07-14 MED ORDER — PACLITAXEL CHEMO INJECTION 300 MG/50ML
80.0000 mg/m2 | Freq: Once | INTRAVENOUS | Status: AC
  Administered 2016-07-14: 144 mg via INTRAVENOUS
  Filled 2016-07-14: qty 24

## 2016-07-14 NOTE — Patient Instructions (Signed)
[facility] Discharge Instructions for Patients Receiving Chemotherapy  Today you received the following chemotherapy agent: Taxol.  To help prevent nausea and vomiting after your treatment, we encourage you to take your nausea medication as prescribed.   If you develop nausea and vomiting that is not controlled by your nausea medication, call the clinic.   BELOW ARE SYMPTOMS THAT SHOULD BE REPORTED IMMEDIATELY:  *FEVER GREATER THAN 100.5 F  *CHILLS WITH OR WITHOUT FEVER  NAUSEA AND VOMITING THAT IS NOT CONTROLLED WITH YOUR NAUSEA MEDICATION  *UNUSUAL SHORTNESS OF BREATH  *UNUSUAL BRUISING OR BLEEDING  TENDERNESS IN MOUTH AND THROAT WITH OR WITHOUT PRESENCE OF ULCERS  *URINARY PROBLEMS  *BOWEL PROBLEMS  UNUSUAL RASH Items with * indicate a potential emergency and should be followed up as soon as possible.  Feel free to call the clinic you have any questions or concerns. The clinic phone number is [phone].  Please show the [facility] at check-in to the Emergency Department and triage nurse.  Paclitaxel (Taxol) injection What is this medicine? PACLITAXEL (PAK li TAX el) is a chemotherapy drug. It targets fast dividing cells, like cancer cells, and causes these cells to die. This medicine is used to treat ovarian cancer, breast cancer, and other cancers. This medicine may be used for other purposes; ask your health care provider or pharmacist if you have questions. COMMON BRAND NAME(S): Onxol, Taxol What should I tell my health care provider before I take this medicine? They need to know if you have any of these conditions: -blood disorders -irregular heartbeat -infection (especially a virus infection such as chickenpox, cold sores, or herpes) -liver disease -previous or ongoing radiation therapy -an unusual or allergic reaction to paclitaxel, alcohol, polyoxyethylated castor oil, other chemotherapy agents, other medicines, foods, dyes, or  preservatives -pregnant or trying to get pregnant -breast-feeding How should I use this medicine? This drug is given as an infusion into a vein. It is administered in a hospital or clinic by a specially trained health care professional. Talk to your pediatrician regarding the use of this medicine in children. Special care may be needed. Overdosage: If you think you have taken too much of this medicine contact a poison control center or emergency room at once. NOTE: This medicine is only for you. Do not share this medicine with others. What if I miss a dose? It is important not to miss your dose. Call your doctor or health care professional if you are unable to keep an appointment. What may interact with this medicine? Do not take this medicine with any of the following medications: -disulfiram -metronidazole This medicine may also interact with the following medications: -cyclosporine -diazepam -ketoconazole -medicines to increase blood counts like filgrastim, pegfilgrastim, sargramostim -other chemotherapy drugs like cisplatin, doxorubicin, epirubicin, etoposide, teniposide, vincristine -quinidine -testosterone -vaccines -verapamil Talk to your doctor or health care professional before taking any of these medicines: -acetaminophen -aspirin -ibuprofen -ketoprofen -naproxen This list may not describe all possible interactions. Give your health care provider a list of all the medicines, herbs, non-prescription drugs, or dietary supplements you use. Also tell them if you smoke, drink alcohol, or use illegal drugs. Some items may interact with your medicine. What should I watch for while using this medicine? Your condition will be monitored carefully while you are receiving this medicine. You will need important blood work done while you are taking this medicine. This medicine can cause serious allergic reactions. To reduce your risk you will need to take  other medicine(s) before  treatment with this medicine. If you experience allergic reactions like skin rash, itching or hives, swelling of the face, lips, or tongue, tell your doctor or health care professional right away. In some cases, you may be given additional medicines to help with side effects. Follow all directions for their use. This drug may make you feel generally unwell. This is not uncommon, as chemotherapy can affect healthy cells as well as cancer cells. Report any side effects. Continue your course of treatment even though you feel ill unless your doctor tells you to stop. Call your doctor or health care professional for advice if you get a fever, chills or sore throat, or other symptoms of a cold or flu. Do not treat yourself. This drug decreases your body's ability to fight infections. Try to avoid being around people who are sick. This medicine may increase your risk to bruise or bleed. Call your doctor or health care professional if you notice any unusual bleeding. Be careful brushing and flossing your teeth or using a toothpick because you may get an infection or bleed more easily. If you have any dental work done, tell your dentist you are receiving this medicine. Avoid taking products that contain aspirin, acetaminophen, ibuprofen, naproxen, or ketoprofen unless instructed by your doctor. These medicines may hide a fever. Do not become pregnant while taking this medicine. Women should inform their doctor if they wish to become pregnant or think they might be pregnant. There is a potential for serious side effects to an unborn child. Talk to your health care professional or pharmacist for more information. Do not breast-feed an infant while taking this medicine. Men are advised not to father a child while receiving this medicine. This product may contain alcohol. Ask your pharmacist or healthcare provider if this medicine contains alcohol. Be sure to tell all healthcare providers you are taking this medicine.  Certain medicines, like metronidazole and disulfiram, can cause an unpleasant reaction when taken with alcohol. The reaction includes flushing, headache, nausea, vomiting, sweating, and increased thirst. The reaction can last from 30 minutes to several hours. What side effects may I notice from receiving this medicine? Side effects that you should report to your doctor or health care professional as soon as possible: -allergic reactions like skin rash, itching or hives, swelling of the face, lips, or tongue -low blood counts - This drug may decrease the number of white blood cells, red blood cells and platelets. You may be at increased risk for infections and bleeding. -signs of infection - fever or chills, cough, sore throat, pain or difficulty passing urine -signs of decreased platelets or bleeding - bruising, pinpoint red spots on the skin, black, tarry stools, nosebleeds -signs of decreased red blood cells - unusually weak or tired, fainting spells, lightheadedness -breathing problems -chest pain -high or low blood pressure -mouth sores -nausea and vomiting -pain, swelling, redness or irritation at the injection site -pain, tingling, numbness in the hands or feet -slow or irregular heartbeat -swelling of the ankle, feet, hands Side effects that usually do not require medical attention (report to your doctor or health care professional if they continue or are bothersome): -bone pain -complete hair loss including hair on your head, underarms, pubic hair, eyebrows, and eyelashes -changes in the color of fingernails -diarrhea -loosening of the fingernails -loss of appetite -muscle or joint pain -red flush to skin -sweating This list may not describe all possible side effects. Call your doctor for medical advice about side   effects. You may report side effects to FDA at 1-800-FDA-1088. Where should I keep my medicine? This drug is given in a hospital or clinic and will not be stored at  home. NOTE: This sheet is a summary. It may not cover all possible information. If you have questions about this medicine, talk to your doctor, pharmacist, or health care provider.  2018 Elsevier/Gold Standard (2014-11-25 19:58:00)

## 2016-07-14 NOTE — Patient Instructions (Signed)

## 2016-07-15 ENCOUNTER — Telehealth: Payer: Self-pay | Admitting: Hematology

## 2016-07-15 ENCOUNTER — Telehealth: Payer: Self-pay | Admitting: *Deleted

## 2016-07-15 NOTE — Telephone Encounter (Signed)
Left message for pt to call back if any questions or concerns over weekend related to her chemotherapy.  Just calling to check to see how she was doing post chemo with taxol.

## 2016-07-15 NOTE — Telephone Encounter (Signed)
Scheduled appt per 6/7 los - added MD visit - patient to get new calender 6/14

## 2016-07-15 NOTE — Telephone Encounter (Signed)
-----   Message from Ma Hillock, RN sent at 07/14/2016  1:22 PM EDT ----- Regarding: [facility]: 1st Taxol F/U Patient received first dose of taxol today. Please call patient to follow up. No immediate complications from treatment today.

## 2016-07-17 ENCOUNTER — Other Ambulatory Visit: Payer: Self-pay | Admitting: Internal Medicine

## 2016-07-18 NOTE — Telephone Encounter (Signed)
Return call to patient. Left message to call back. 

## 2016-07-18 NOTE — Telephone Encounter (Signed)
Call to patient. Left message to call back.  

## 2016-07-18 NOTE — Telephone Encounter (Signed)
Patient returning your call.

## 2016-07-19 NOTE — Telephone Encounter (Signed)
Return call from patient. Discussed need for oncology clearance before scheduling surgery.  Patient states she has discussed with oncology and they did recommend at least 4-6 week recovery following chemo. Patient to call back once chemo completed and has an idea when she will be released.   Brief discussion on 2 week recovery following surgical procedure. Lifting restrictions and no exercise other than walking. Patient had requested to participate in yoga and horseback riding. Advised she would not be able to do this for at least 2 weeks.     Routing to provider for final review. Patient agreeable to disposition. Will close encounter.

## 2016-07-21 ENCOUNTER — Ambulatory Visit (HOSPITAL_BASED_OUTPATIENT_CLINIC_OR_DEPARTMENT_OTHER): Payer: BLUE CROSS/BLUE SHIELD

## 2016-07-21 ENCOUNTER — Ambulatory Visit (HOSPITAL_BASED_OUTPATIENT_CLINIC_OR_DEPARTMENT_OTHER): Payer: BLUE CROSS/BLUE SHIELD | Admitting: Hematology

## 2016-07-21 ENCOUNTER — Other Ambulatory Visit (HOSPITAL_BASED_OUTPATIENT_CLINIC_OR_DEPARTMENT_OTHER): Payer: BLUE CROSS/BLUE SHIELD

## 2016-07-21 ENCOUNTER — Other Ambulatory Visit: Payer: Self-pay | Admitting: Internal Medicine

## 2016-07-21 ENCOUNTER — Ambulatory Visit: Payer: BLUE CROSS/BLUE SHIELD

## 2016-07-21 VITALS — BP 124/76 | HR 98 | Temp 97.7°F | Resp 18 | Ht 64.0 in | Wt 152.9 lb

## 2016-07-21 DIAGNOSIS — R21 Rash and other nonspecific skin eruption: Secondary | ICD-10-CM

## 2016-07-21 DIAGNOSIS — Z5111 Encounter for antineoplastic chemotherapy: Secondary | ICD-10-CM

## 2016-07-21 DIAGNOSIS — D6481 Anemia due to antineoplastic chemotherapy: Secondary | ICD-10-CM

## 2016-07-21 DIAGNOSIS — Z95828 Presence of other vascular implants and grafts: Secondary | ICD-10-CM

## 2016-07-21 DIAGNOSIS — C50111 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 female breast: Secondary | ICD-10-CM | POA: Diagnosis not present

## 2016-07-21 DIAGNOSIS — Z170 Estrogen receptor positive status [ER+]: Principal | ICD-10-CM

## 2016-07-21 DIAGNOSIS — Z1501 Genetic susceptibility to malignant neoplasm of breast: Secondary | ICD-10-CM

## 2016-07-21 DIAGNOSIS — I10 Essential (primary) hypertension: Secondary | ICD-10-CM

## 2016-07-21 DIAGNOSIS — Z1509 Genetic susceptibility to other malignant neoplasm: Secondary | ICD-10-CM

## 2016-07-21 LAB — CBC WITH DIFFERENTIAL/PLATELET
BASO%: 0.8 % (ref 0.0–2.0)
BASOS ABS: 0 10*3/uL (ref 0.0–0.1)
EOS ABS: 0 10*3/uL (ref 0.0–0.5)
EOS%: 0.6 % (ref 0.0–7.0)
HEMATOCRIT: 29.3 % — AB (ref 34.8–46.6)
HGB: 10.1 g/dL — ABNORMAL LOW (ref 11.6–15.9)
LYMPH#: 0.8 10*3/uL — AB (ref 0.9–3.3)
LYMPH%: 23.9 % (ref 14.0–49.7)
MCH: 33.9 pg (ref 25.1–34.0)
MCHC: 34.3 g/dL (ref 31.5–36.0)
MCV: 98.7 fL (ref 79.5–101.0)
MONO#: 0.6 10*3/uL (ref 0.1–0.9)
MONO%: 18.6 % — ABNORMAL HIGH (ref 0.0–14.0)
NEUT#: 1.8 10*3/uL (ref 1.5–6.5)
NEUT%: 56.1 % (ref 38.4–76.8)
PLATELETS: 304 10*3/uL (ref 145–400)
RBC: 2.97 10*6/uL — AB (ref 3.70–5.45)
RDW: 19.2 % — ABNORMAL HIGH (ref 11.2–14.5)
WBC: 3.2 10*3/uL — ABNORMAL LOW (ref 3.9–10.3)

## 2016-07-21 LAB — COMPREHENSIVE METABOLIC PANEL
ALT: 15 U/L (ref 0–55)
ANION GAP: 9 meq/L (ref 3–11)
AST: 19 U/L (ref 5–34)
Albumin: 3.8 g/dL (ref 3.5–5.0)
Alkaline Phosphatase: 101 U/L (ref 40–150)
BILIRUBIN TOTAL: 0.22 mg/dL (ref 0.20–1.20)
BUN: 12.1 mg/dL (ref 7.0–26.0)
CALCIUM: 9.4 mg/dL (ref 8.4–10.4)
CHLORIDE: 108 meq/L (ref 98–109)
CO2: 27 meq/L (ref 22–29)
Creatinine: 0.7 mg/dL (ref 0.6–1.1)
Glucose: 88 mg/dl (ref 70–140)
Potassium: 3.9 mEq/L (ref 3.5–5.1)
Sodium: 143 mEq/L (ref 136–145)
Total Protein: 6.4 g/dL (ref 6.4–8.3)

## 2016-07-21 MED ORDER — DEXAMETHASONE SODIUM PHOSPHATE 10 MG/ML IJ SOLN
INTRAMUSCULAR | Status: AC
  Filled 2016-07-21: qty 1

## 2016-07-21 MED ORDER — SODIUM CHLORIDE 0.9% FLUSH
10.0000 mL | INTRAVENOUS | Status: DC | PRN
  Administered 2016-07-21: 10 mL via INTRAVENOUS
  Filled 2016-07-21: qty 10

## 2016-07-21 MED ORDER — SODIUM CHLORIDE 0.9 % IV SOLN
Freq: Once | INTRAVENOUS | Status: AC
  Administered 2016-07-21: 10:00:00 via INTRAVENOUS

## 2016-07-21 MED ORDER — DEXAMETHASONE SODIUM PHOSPHATE 10 MG/ML IJ SOLN
10.0000 mg | Freq: Once | INTRAMUSCULAR | Status: AC
  Administered 2016-07-21: 10 mg via INTRAVENOUS

## 2016-07-21 MED ORDER — HEPARIN SOD (PORK) LOCK FLUSH 100 UNIT/ML IV SOLN
500.0000 [IU] | Freq: Once | INTRAVENOUS | Status: AC | PRN
  Administered 2016-07-21: 500 [IU]
  Filled 2016-07-21: qty 5

## 2016-07-21 MED ORDER — FAMOTIDINE IN NACL 20-0.9 MG/50ML-% IV SOLN
20.0000 mg | Freq: Once | INTRAVENOUS | Status: AC
  Administered 2016-07-21: 20 mg via INTRAVENOUS

## 2016-07-21 MED ORDER — SODIUM CHLORIDE 0.9% FLUSH
10.0000 mL | INTRAVENOUS | Status: DC | PRN
  Administered 2016-07-21: 10 mL
  Filled 2016-07-21: qty 10

## 2016-07-21 MED ORDER — FAMOTIDINE IN NACL 20-0.9 MG/50ML-% IV SOLN
INTRAVENOUS | Status: AC
  Filled 2016-07-21: qty 50

## 2016-07-21 MED ORDER — DIPHENHYDRAMINE HCL 50 MG/ML IJ SOLN
25.0000 mg | Freq: Once | INTRAMUSCULAR | Status: AC
  Administered 2016-07-21: 25 mg via INTRAVENOUS

## 2016-07-21 MED ORDER — DIPHENHYDRAMINE HCL 50 MG/ML IJ SOLN
INTRAMUSCULAR | Status: AC
Start: 2016-07-21 — End: 2016-07-21
  Filled 2016-07-21: qty 1

## 2016-07-21 MED ORDER — DEXTROSE 5 % IV SOLN
80.0000 mg/m2 | Freq: Once | INTRAVENOUS | Status: AC
  Administered 2016-07-21: 144 mg via INTRAVENOUS
  Filled 2016-07-21: qty 24

## 2016-07-21 NOTE — Patient Instructions (Signed)
[facility] Discharge Instructions for Patients Receiving Chemotherapy  Today you received the following chemotherapy agents Taxol   To help prevent nausea and vomiting after your treatment, we encourage you to take your nausea medication as directed.   If you develop nausea and vomiting that is not controlled by your nausea medication, call the clinic.   BELOW ARE SYMPTOMS THAT SHOULD BE REPORTED IMMEDIATELY:  *FEVER GREATER THAN 100.5 F  *CHILLS WITH OR WITHOUT FEVER  NAUSEA AND VOMITING THAT IS NOT CONTROLLED WITH YOUR NAUSEA MEDICATION  *UNUSUAL SHORTNESS OF BREATH  *UNUSUAL BRUISING OR BLEEDING  TENDERNESS IN MOUTH AND THROAT WITH OR WITHOUT PRESENCE OF ULCERS  *URINARY PROBLEMS  *BOWEL PROBLEMS  UNUSUAL RASH Items with * indicate a potential emergency and should be followed up as soon as possible.  Feel free to call the clinic you have any questions or concerns. The clinic phone number is [phone].  Please show the CHEMO ALERT CARD at check-in to the Emergency Department and triage nurse.   

## 2016-07-23 ENCOUNTER — Encounter: Payer: Self-pay | Admitting: Hematology

## 2016-07-28 ENCOUNTER — Ambulatory Visit: Payer: BLUE CROSS/BLUE SHIELD

## 2016-07-28 ENCOUNTER — Other Ambulatory Visit (HOSPITAL_BASED_OUTPATIENT_CLINIC_OR_DEPARTMENT_OTHER): Payer: BLUE CROSS/BLUE SHIELD

## 2016-07-28 ENCOUNTER — Encounter: Payer: Self-pay | Admitting: *Deleted

## 2016-07-28 ENCOUNTER — Ambulatory Visit (HOSPITAL_BASED_OUTPATIENT_CLINIC_OR_DEPARTMENT_OTHER): Payer: BLUE CROSS/BLUE SHIELD

## 2016-07-28 VITALS — BP 121/85 | HR 78 | Temp 98.0°F | Resp 16

## 2016-07-28 DIAGNOSIS — Z170 Estrogen receptor positive status [ER+]: Principal | ICD-10-CM

## 2016-07-28 DIAGNOSIS — Z5111 Encounter for antineoplastic chemotherapy: Secondary | ICD-10-CM | POA: Diagnosis not present

## 2016-07-28 DIAGNOSIS — C50111 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 female breast: Secondary | ICD-10-CM

## 2016-07-28 DIAGNOSIS — Z95828 Presence of other vascular implants and grafts: Secondary | ICD-10-CM

## 2016-07-28 LAB — COMPREHENSIVE METABOLIC PANEL
ALBUMIN: 3.8 g/dL (ref 3.5–5.0)
ALK PHOS: 79 U/L (ref 40–150)
ALT: 23 U/L (ref 0–55)
ANION GAP: 10 meq/L (ref 3–11)
AST: 22 U/L (ref 5–34)
BILIRUBIN TOTAL: 0.24 mg/dL (ref 0.20–1.20)
BUN: 11.9 mg/dL (ref 7.0–26.0)
CALCIUM: 9.1 mg/dL (ref 8.4–10.4)
CO2: 26 meq/L (ref 22–29)
CREATININE: 0.6 mg/dL (ref 0.6–1.1)
Chloride: 106 mEq/L (ref 98–109)
Glucose: 86 mg/dl (ref 70–140)
Potassium: 4.4 mEq/L (ref 3.5–5.1)
Sodium: 142 mEq/L (ref 136–145)
TOTAL PROTEIN: 6.3 g/dL — AB (ref 6.4–8.3)

## 2016-07-28 LAB — CBC WITH DIFFERENTIAL/PLATELET
BASO%: 1.2 % (ref 0.0–2.0)
Basophils Absolute: 0 10*3/uL (ref 0.0–0.1)
EOS ABS: 0 10*3/uL (ref 0.0–0.5)
EOS%: 1.8 % (ref 0.0–7.0)
HCT: 29.9 % — ABNORMAL LOW (ref 34.8–46.6)
HGB: 10 g/dL — ABNORMAL LOW (ref 11.6–15.9)
LYMPH%: 29.7 % (ref 14.0–49.7)
MCH: 33.6 pg (ref 25.1–34.0)
MCHC: 33.5 g/dL (ref 31.5–36.0)
MCV: 100.2 fL (ref 79.5–101.0)
MONO#: 0.5 10*3/uL (ref 0.1–0.9)
MONO%: 17.2 % — ABNORMAL HIGH (ref 0.0–14.0)
NEUT%: 50.1 % (ref 38.4–76.8)
NEUTROS ABS: 1.3 10*3/uL — AB (ref 1.5–6.5)
Platelets: 196 10*3/uL (ref 145–400)
RBC: 2.99 10*6/uL — AB (ref 3.70–5.45)
RDW: 20.3 % — ABNORMAL HIGH (ref 11.2–14.5)
WBC: 2.7 10*3/uL — AB (ref 3.9–10.3)
lymph#: 0.8 10*3/uL — ABNORMAL LOW (ref 0.9–3.3)

## 2016-07-28 MED ORDER — SODIUM CHLORIDE 0.9% FLUSH
10.0000 mL | INTRAVENOUS | Status: DC | PRN
  Administered 2016-07-28: 10 mL
  Filled 2016-07-28: qty 10

## 2016-07-28 MED ORDER — DIPHENHYDRAMINE HCL 50 MG/ML IJ SOLN
INTRAMUSCULAR | Status: AC
  Filled 2016-07-28: qty 1

## 2016-07-28 MED ORDER — SODIUM CHLORIDE 0.9 % IV SOLN
Freq: Once | INTRAVENOUS | Status: AC
  Administered 2016-07-28: 10:00:00 via INTRAVENOUS

## 2016-07-28 MED ORDER — FAMOTIDINE IN NACL 20-0.9 MG/50ML-% IV SOLN
INTRAVENOUS | Status: AC
  Filled 2016-07-28: qty 50

## 2016-07-28 MED ORDER — SODIUM CHLORIDE 0.9% FLUSH
10.0000 mL | INTRAVENOUS | Status: DC | PRN
  Administered 2016-07-28: 10 mL via INTRAVENOUS
  Filled 2016-07-28: qty 10

## 2016-07-28 MED ORDER — PACLITAXEL CHEMO INJECTION 300 MG/50ML
80.0000 mg/m2 | Freq: Once | INTRAVENOUS | Status: AC
  Administered 2016-07-28: 144 mg via INTRAVENOUS
  Filled 2016-07-28: qty 24

## 2016-07-28 MED ORDER — DEXAMETHASONE SODIUM PHOSPHATE 10 MG/ML IJ SOLN
10.0000 mg | Freq: Once | INTRAMUSCULAR | Status: AC
  Administered 2016-07-28: 10 mg via INTRAVENOUS

## 2016-07-28 MED ORDER — HEPARIN SOD (PORK) LOCK FLUSH 100 UNIT/ML IV SOLN
500.0000 [IU] | Freq: Once | INTRAVENOUS | Status: AC | PRN
  Administered 2016-07-28: 500 [IU]
  Filled 2016-07-28: qty 5

## 2016-07-28 MED ORDER — DIPHENHYDRAMINE HCL 50 MG/ML IJ SOLN
25.0000 mg | Freq: Once | INTRAMUSCULAR | Status: AC
  Administered 2016-07-28: 25 mg via INTRAVENOUS

## 2016-07-28 MED ORDER — FAMOTIDINE IN NACL 20-0.9 MG/50ML-% IV SOLN
20.0000 mg | Freq: Once | INTRAVENOUS | Status: AC
  Administered 2016-07-28: 20 mg via INTRAVENOUS

## 2016-07-28 MED ORDER — DEXAMETHASONE SODIUM PHOSPHATE 10 MG/ML IJ SOLN
INTRAMUSCULAR | Status: AC
  Filled 2016-07-28: qty 1

## 2016-07-28 NOTE — Patient Instructions (Signed)
Implanted Port Home Guide An implanted port is a type of central line that is placed under the skin. Central lines are used to provide IV access when treatment or nutrition needs to be given through a person's veins. Implanted ports are used for long-term IV access. An implanted port may be placed because:  You need IV medicine that would be irritating to the small veins in your hands or arms.  You need long-term IV medicines, such as antibiotics.  You need IV nutrition for a long period.  You need frequent blood draws for lab tests.  You need dialysis.  Implanted ports are usually placed in the chest area, but they can also be placed in the upper arm, the abdomen, or the leg. An implanted port has two main parts:  Reservoir. The reservoir is round and will appear as a small, raised area under your skin. The reservoir is the part where a needle is inserted to give medicines or draw blood.  Catheter. The catheter is a thin, flexible tube that extends from the reservoir. The catheter is placed into a large vein. Medicine that is inserted into the reservoir goes into the catheter and then into the vein.  How will I care for my incision site? Do not get the incision site wet. Bathe or shower as directed by your health care provider. How is my port accessed? Special steps must be taken to access the port:  Before the port is accessed, a numbing cream can be placed on the skin. This helps numb the skin over the port site.  Your health care provider uses a sterile technique to access the port. ? Your health care provider must put on a mask and sterile gloves. ? The skin over your port is cleaned carefully with an antiseptic and allowed to dry. ? The port is gently pinched between sterile gloves, and a needle is inserted into the port.  Only "non-coring" port needles should be used to access the port. Once the port is accessed, a blood return should be checked. This helps ensure that the port  is in the vein and is not clogged.  If your port needs to remain accessed for a constant infusion, a clear (transparent) bandage will be placed over the needle site. The bandage and needle will need to be changed every week, or as directed by your health care provider.  Keep the bandage covering the needle clean and dry. Do not get it wet. Follow your health care provider's instructions on how to take a shower or bath while the port is accessed.  If your port does not need to stay accessed, no bandage is needed over the port.  What is flushing? Flushing helps keep the port from getting clogged. Follow your health care provider's instructions on how and when to flush the port. Ports are usually flushed with saline solution or a medicine called heparin. The need for flushing will depend on how the port is used.  If the port is used for intermittent medicines or blood draws, the port will need to be flushed: ? After medicines have been given. ? After blood has been drawn. ? As part of routine maintenance.  If a constant infusion is running, the port may not need to be flushed.  How long will my port stay implanted? The port can stay in for as long as your health care provider thinks it is needed. When it is time for the port to come out, surgery will be   done to remove it. The procedure is similar to the one performed when the port was put in. When should I seek immediate medical care? When you have an implanted port, you should seek immediate medical care if:  You notice a bad smell coming from the incision site.  You have swelling, redness, or drainage at the incision site.  You have more swelling or pain at the port site or the surrounding area.  You have a fever that is not controlled with medicine.  This information is not intended to replace advice given to you by your health care provider. Make sure you discuss any questions you have with your health care provider. Document  Released: 01/24/2005 Document Revised: 07/02/2015 Document Reviewed: 10/01/2012 Elsevier Interactive Patient Education  2017 [employer]  

## 2016-07-28 NOTE — Progress Notes (Signed)
Dr. Benay Spice aware of ANC 1.3. Advised ok to treat.

## 2016-07-28 NOTE — Patient Instructions (Signed)
[facility] Discharge Instructions for Patients Receiving Chemotherapy  Today you received the following chemotherapy agents:  Taxol.  To help prevent nausea and vomiting after your treatment, we encourage you to take your nausea medication as directed.   If you develop nausea and vomiting that is not controlled by your nausea medication, call the clinic.   BELOW ARE SYMPTOMS THAT SHOULD BE REPORTED IMMEDIATELY:  *FEVER GREATER THAN 100.5 F  *CHILLS WITH OR WITHOUT FEVER  NAUSEA AND VOMITING THAT IS NOT CONTROLLED WITH YOUR NAUSEA MEDICATION  *UNUSUAL SHORTNESS OF BREATH  *UNUSUAL BRUISING OR BLEEDING  TENDERNESS IN MOUTH AND THROAT WITH OR WITHOUT PRESENCE OF ULCERS  *URINARY PROBLEMS  *BOWEL PROBLEMS  UNUSUAL RASH Items with * indicate a potential emergency and should be followed up as soon as possible.  Feel free to call the clinic you have any questions or concerns. The clinic phone number is [phone].  Please show the [facility] at check-in to the Emergency Department and triage nurse.   Neutropenia Neutropenia is a condition that occurs when you have a lower-than-normal level of a type of white blood cell (neutrophil) in your body. Neutrophils are made in the spongy center of large bones (bone marrow) and they fight infections. Neutrophils are your body's main defense against bacterial and fungal infections. The fewer neutrophils you have and the longer your body remains without them, the greater your risk of getting a severe infection. What are the causes? This condition can occur if your body uses up or destroys neutrophils faster than your bone marrow can make them. This problem may happen because of:  Bacterial or fungal infection.  Allergic disorders.  Reactions to some medicines.  Autoimmune disease.  An enlarged spleen.  This condition can also occur if your bone marrow does not produce enough neutrophils. This problem may be  caused by:  Cancer.  Cancer treatments, such as radiation or chemotherapy.  Viral infections.  Medicines, such as phenytoin.  Vitamin B12 deficiency.  Diseases of the bone marrow.  Environmental toxins, such as insecticides.  What are the signs or symptoms? This condition does not usually cause symptoms. If symptoms are present, they are usually caused by an underlying infection. Symptoms of an infection may include:  Fever.  Chills.  Swollen glands.  Oral or anal ulcers.  Cough and shortness of breath.  Rash.  Skin infection.  Fatigue.  How is this diagnosed? Your health care provider may suspect neutropenia if you have:  A condition that may cause neutropenia.  Symptoms of infection, especially fever.  Frequent and unusual infections.  You will have a medical history and physical exam. Tests will also be done, such as:  A complete blood count (CBC).  A procedure to collect a sample of bone marrow for examination (bone marrow biopsy).  A chest X-ray.  A urine culture.  A blood culture.  How is this treated? Treatment depends on the underlying cause and severity of your condition. Mild neutropenia may not require treatment. Treatment may include medicines, such as:  Antibiotic medicine given through an IV tube.  Antiviral medicines.  Antifungal medicines.  A medicine to increase neutrophil production (colony-stimulating factor). You may get this drug through an IV tube or by injection.  Steroids given through an IV tube.  If an underlying condition is causing neutropenia, you may need treatment for that condition. If medicines you are taking are causing neutropenia, your health care provider may have you stop taking those  medicines. Follow these instructions at home: Medicines  Take over-the-counter and prescription medicines only as told by your health care provider.  Get a seasonal flu shot (influenza vaccine). Lifestyle  Do not eat  unpasteurized foods.Do not eat unwashed raw fruits or vegetables.  Avoid exposure to groups of people or children.  Avoid being around people who are sick.  Avoid being around dirt or dust, such as in construction areas or gardens.  Do not provide direct care for pets. Avoid animal droppings. Do not clean litter boxes and bird cages. Hygiene   Bathe daily.  Clean the area between the genitals and the anus (perineal area) after you urinate or have a bowel movement. If you are female, wipe from front to back.  Brush your teeth with a soft toothbrush before and after meals.  Do not use a razor that has a blade. Use an electric razor to remove hair.  Wash your hands often. Make sure others who come in contact with you also wash their hands. If soap and water are not available, use hand sanitizer. General instructions  Do not have sex unless your health care provider has approved.  Take actions to avoid cuts and burns. For example: ? Be cautious when you use knives. Always cut away from yourself. ? Keep knives in protective sheaths or guards when not in use. ? Use oven mitts when you cook with a hot stove, oven, or grill. ? Stand a safe distance away from open fires.  Avoid people who received a vaccine in the past 30 days if that vaccine contained a live version of the germ (live vaccine). You should not get a live vaccine. Common live vaccines are varicella, measles, mumps, and rubella.  Do not share food utensils.  Do not use tampons, enemas, or rectal suppositories unless your health care provider has approved.  Keep all appointments as told by your health care provider. This is important. Contact a health care provider if:  You have a fever.  You have chills or you start to shake.  You have: ? A sore throat. ? A warm, red, or tender area on your skin. ? A cough. ? Frequent or painful urination. ? Vaginal discharge or itching.  You develop: ? Sores in your mouth or  anus. ? Swollen lymph nodes. ? Red streaks on the skin. ? A rash.  You feel: ? Nauseous or you vomit. ? Very fatigued. ? Short of breath. This information is not intended to replace advice given to you by your health care provider. Make sure you discuss any questions you have with your health care provider. Document Released: 07/16/2001 Document Revised: 07/02/2015 Document Reviewed: 08/06/2014 Elsevier Interactive Patient Education  [employer].

## 2016-08-04 ENCOUNTER — Ambulatory Visit (HOSPITAL_BASED_OUTPATIENT_CLINIC_OR_DEPARTMENT_OTHER): Payer: BLUE CROSS/BLUE SHIELD

## 2016-08-04 ENCOUNTER — Other Ambulatory Visit (HOSPITAL_BASED_OUTPATIENT_CLINIC_OR_DEPARTMENT_OTHER): Payer: BLUE CROSS/BLUE SHIELD

## 2016-08-04 VITALS — BP 136/93 | HR 79 | Temp 98.1°F | Resp 16

## 2016-08-04 DIAGNOSIS — Z5111 Encounter for antineoplastic chemotherapy: Secondary | ICD-10-CM | POA: Diagnosis not present

## 2016-08-04 DIAGNOSIS — C50111 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 female breast: Secondary | ICD-10-CM

## 2016-08-04 DIAGNOSIS — Z170 Estrogen receptor positive status [ER+]: Principal | ICD-10-CM

## 2016-08-04 LAB — CBC WITH DIFFERENTIAL/PLATELET
BASO%: 1 % (ref 0.0–2.0)
BASOS ABS: 0 10*3/uL (ref 0.0–0.1)
EOS ABS: 0.1 10*3/uL (ref 0.0–0.5)
EOS%: 3.2 % (ref 0.0–7.0)
HCT: 31.6 % — ABNORMAL LOW (ref 34.8–46.6)
HEMOGLOBIN: 10.7 g/dL — AB (ref 11.6–15.9)
LYMPH%: 23.5 % (ref 14.0–49.7)
MCH: 34.4 pg — AB (ref 25.1–34.0)
MCHC: 34 g/dL (ref 31.5–36.0)
MCV: 101.2 fL — AB (ref 79.5–101.0)
MONO#: 0.5 10*3/uL (ref 0.1–0.9)
MONO%: 13.4 % (ref 0.0–14.0)
NEUT#: 2.4 10*3/uL (ref 1.5–6.5)
NEUT%: 58.9 % (ref 38.4–76.8)
Platelets: 244 10*3/uL (ref 145–400)
RBC: 3.12 10*6/uL — ABNORMAL LOW (ref 3.70–5.45)
RDW: 20 % — AB (ref 11.2–14.5)
WBC: 4 10*3/uL (ref 3.9–10.3)
lymph#: 0.9 10*3/uL (ref 0.9–3.3)

## 2016-08-04 LAB — COMPREHENSIVE METABOLIC PANEL
ALBUMIN: 3.9 g/dL (ref 3.5–5.0)
ALK PHOS: 88 U/L (ref 40–150)
ALT: 23 U/L (ref 0–55)
AST: 22 U/L (ref 5–34)
Anion Gap: 9 mEq/L (ref 3–11)
BUN: 14 mg/dL (ref 7.0–26.0)
CALCIUM: 9.6 mg/dL (ref 8.4–10.4)
CHLORIDE: 107 meq/L (ref 98–109)
CO2: 25 mEq/L (ref 22–29)
Creatinine: 0.6 mg/dL (ref 0.6–1.1)
GLUCOSE: 95 mg/dL (ref 70–140)
POTASSIUM: 4.3 meq/L (ref 3.5–5.1)
SODIUM: 141 meq/L (ref 136–145)
Total Bilirubin: 0.31 mg/dL (ref 0.20–1.20)
Total Protein: 6.5 g/dL (ref 6.4–8.3)

## 2016-08-04 MED ORDER — FAMOTIDINE IN NACL 20-0.9 MG/50ML-% IV SOLN
INTRAVENOUS | Status: AC
  Filled 2016-08-04: qty 50

## 2016-08-04 MED ORDER — DEXAMETHASONE SODIUM PHOSPHATE 10 MG/ML IJ SOLN
INTRAMUSCULAR | Status: AC
  Filled 2016-08-04: qty 1

## 2016-08-04 MED ORDER — HEPARIN SOD (PORK) LOCK FLUSH 100 UNIT/ML IV SOLN
500.0000 [IU] | Freq: Once | INTRAVENOUS | Status: AC | PRN
  Administered 2016-08-04: 500 [IU]
  Filled 2016-08-04: qty 5

## 2016-08-04 MED ORDER — FAMOTIDINE IN NACL 20-0.9 MG/50ML-% IV SOLN
20.0000 mg | Freq: Once | INTRAVENOUS | Status: AC
  Administered 2016-08-04: 20 mg via INTRAVENOUS

## 2016-08-04 MED ORDER — PACLITAXEL CHEMO INJECTION 300 MG/50ML
80.0000 mg/m2 | Freq: Once | INTRAVENOUS | Status: AC
  Administered 2016-08-04: 144 mg via INTRAVENOUS
  Filled 2016-08-04: qty 24

## 2016-08-04 MED ORDER — SODIUM CHLORIDE 0.9 % IV SOLN
Freq: Once | INTRAVENOUS | Status: AC
  Administered 2016-08-04: 09:00:00 via INTRAVENOUS

## 2016-08-04 MED ORDER — DIPHENHYDRAMINE HCL 50 MG/ML IJ SOLN
INTRAMUSCULAR | Status: AC
  Filled 2016-08-04: qty 1

## 2016-08-04 MED ORDER — DIPHENHYDRAMINE HCL 50 MG/ML IJ SOLN
25.0000 mg | Freq: Once | INTRAMUSCULAR | Status: AC
Start: 2016-08-04 — End: 2016-08-04
  Administered 2016-08-04: 25 mg via INTRAVENOUS

## 2016-08-04 MED ORDER — DEXAMETHASONE SODIUM PHOSPHATE 10 MG/ML IJ SOLN
10.0000 mg | Freq: Once | INTRAMUSCULAR | Status: AC
  Administered 2016-08-04: 10 mg via INTRAVENOUS

## 2016-08-04 MED ORDER — SODIUM CHLORIDE 0.9% FLUSH
10.0000 mL | INTRAVENOUS | Status: DC | PRN
  Administered 2016-08-04: 10 mL
  Filled 2016-08-04: qty 10

## 2016-08-04 NOTE — Patient Instructions (Signed)
[facility] for [facility] Receiving Chemotherapy  Today you received the following chemotherapy agents Taxol   To help prevent nausea and vomiting after your treatment, we encourage you to take your nausea medication as directed.   If you develop nausea and vomiting that is not controlled by your nausea medication, call the clinic.   BELOW ARE SYMPTOMS THAT SHOULD BE REPORTED IMMEDIATELY:  *FEVER GREATER THAN 100.5 F  *CHILLS WITH OR WITHOUT FEVER  NAUSEA AND VOMITING THAT IS NOT CONTROLLED WITH YOUR NAUSEA MEDICATION  *UNUSUAL SHORTNESS OF BREATH  *UNUSUAL BRUISING OR BLEEDING  TENDERNESS IN MOUTH AND THROAT WITH OR WITHOUT PRESENCE OF ULCERS  *URINARY PROBLEMS  *BOWEL PROBLEMS  UNUSUAL RASH Items with * indicate a potential emergency and should be followed up as soon as possible.  Feel free to call the clinic you have any questions or concerns. The clinic phone number is [phone].  Please show the CHEMO ALERT CARD at check-in to the Emergency Department and triage nurse.   

## 2016-08-09 ENCOUNTER — Other Ambulatory Visit: Payer: Self-pay | Admitting: Physician Assistant

## 2016-08-09 NOTE — Progress Notes (Signed)
[place]  [phone] [phone]  Clinic Follow Up Note   Patient Care Team: [name], MD as PCP - General (Internal Medicine) Verl Blalock, Marijo Conception, MD (Inactive) as Consulting Physician (Cardiology) [name], MD as Consulting Physician (Dermatology) 08/11/2016    CHIEF COMPLAINTS:  Follow up right breast cancer  Oncology History   Cancer Staging 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 breast in female, estrogen receptor positive ([place]) Staging form: Breast, AJCC 8th Edition - Clinical stage from 03/08/2016: Stage IA (cT1c, cN0, cM0, G2, ER: Positive, PR: Negative, HER2: Negative) - Signed by Truitt Merle, MD on 03/15/2016 - Pathologic stage from 04/12/2016: Stage IA (pT1c, pN0, cM0, G2, ER: Positive, PR: Negative, HER2: Negative, Oncotype DX score: 49) - Signed by Truitt Merle, MD on 05/05/2016       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 breast in female, estrogen receptor positive ([place])   03/04/2016 Mammogram    Diagnostic bilateral mammogram and right breast ultrasound showed a 1.7 cm mass in the 12:00 position of the right breast, highly suspicious for malignancy. There is a borderline abnormal right axillary lymph node, also suspicious for metastasis.       03/08/2016 Initial Diagnosis    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 breast in female, estrogen receptor positive ([place])      03/08/2016 Initial Biopsy    Right breast 12:00 core needle biopsy showed invasive ductal carcinoma, grade 2, right axillary lymph node biopsy was negative.      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  Genes Analyzed: 43 genes on [facility]'s Common Cancers panel (APC, ATM, AXIN2, BARD1, BMPR1A, BRCA1, BRCA2, BRIP1, CDH1, CDKN2A, CHEK2, DICER1, EPCAM, GREM1, HOXB13, KIT, MEN1, MLH1, MSH2, MSH6,  MUTYH, NBN, NF1, PALB2, PDGFRA, PMS2, POLD1, POLE, PTEN, RAD50, RAD51C, RAD51D, SDHA, SDHB, SDHC, SDHD, SMAD4, SMARCA4, STK11, TP53, TSC1, TSC2, VHL).       04/12/2016 Surgery    Bilateral mastectomy and right sentinel lymph node biopsy, by [name]      04/12/2016 Pathology Results    Right breast mastectomy showed invasive grade 2 invasive ductal carcinoma, 1.7 cm, margins were negative, 3 sentinel lymph nodes were negative, left simple mastectomy fibroadenoma, one benign node, no atypia or tumor.       05/04/2016 Oncotype testing    Recurrence score 49, high-risk, predicts 10 year risk of distant recurrence 32% with tamoxifen alone.      05/10/2016 Echocardiogram    Echo on 05/10/16 showed EF 55-60%.      05/20/2016 -  Chemotherapy    dose dense Adriamycin and Cytoxan, every 2 weeks starting 05/20/16 for 4 cycles, followed by Taxol starting 07/14/16 (weekly for 12 weeks or biweekly for 4 cycles) ending 09/30/16.       Breast cancer, right ([place])   04/12/2016 Initial Diagnosis    Breast cancer, right ([place])      HISTORY OF PRESENTING ILLNESS (03/16/16):  Chelsea Salinas 61 y.o. female is here because of recently diagnosed right breast invasive mammary carcinoma. She is accompanied by her husband to our multidisciplinary breast clinic today.  The patient underwent routine screening mammogram on 02/29/2016 and was found to have possible architectural distortion and asymmetry in the right breast. Also found was asymmetry in the left breast.  Accordingly a bilateral diagnostic mammogram and unilateral  right breast ultrasound were performed on 03/04/2016. These scans showed a 1.7 cm mass in the 12 o'clock position of the right breast with imaging features highly suspicious for malignancy. Also seen was borderline abnormal right axillary lymph node, suspicious for a metastatic node.  Biopsy of the right breast 12:00 o'clock position on 03/08/2016 revealed invasive mammary carcinoma. The carcinoma  appears to be at least grade 2, ER positive, PR negative, HER-2 negative, Ki67 20%. The malignant cells are positive for E-Cadherin, supporting a ductal phenotype. Biopsy of the right axillary lymph node showed no evidence of carcinoma.   The patient reports to the clinic today for follow up and to discuss chemotherapy. She has had surgery about 3 weeks ago and she has been doing well. She started antibiotics yesterday to help with infection that she has recently gotten. She has also started doing PT this week. She decided not to do reconstruction. She has been eating well.    GYN HISTORY  Menarchal:12 LMP: late 20's (hot flushes since mid 40's)  Contraceptive: 23 HRT: 3-4 years  G0P0:  CURRENT THERAPY: dose dense Adriamycin and Cytoxan, every 2 weeks starting 05/20/16 for 4 cycles, followed by weekly Taxol for 12 weeks starting 07/14/16   INTERVAL HISTORY:   TAKAYA HYSLOP returns for follow up and weekly Taxol. She is receiving cycle 5. She presents to the clinic today with her husband. She has been doing well with Taxol. Shoe does experience fatigue but she still tries to walk and go to yoga about 2-4 times a week. She denies cramps. Her nails are darker from treatment. She is excited about doing well. Her appetite is good.    MEDICAL HISTORY:  Past Medical History:  Diagnosis Date  . Abnormal Pap smear of cervix    --in her 20s had colpo and some type of treatment to cervix.  . Allergy   . Asthma    "all the time, throughout the yr"  . 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 Breast cancer, right breast ([place]) 02/2016  . Eczema   . Family history of breast cancer   . Family history of genetic disease carrier    paternal relatives with a BRCA mutation  . Family history of ovarian cancer   . Hypertension   . Pneumonia    2000ish  . STD (sexually transmitted disease)    HSV II    SURGICAL HISTORY: Past Surgical History:  Procedure Laterality Date  .  BREAST BIOPSY Right 04/13/2016   Procedure: Evacuation RIGHT Breast  Hematoma;  Surgeon: [name], MD;  Location: [facility];  Service: General;  Laterality: Right;  . COLONOSCOPY    . DILATION AND CURETTAGE OF UTERUS  2008  . MASTECTOMY Left 04/12/2016   prophylactic total mastectomy  . MASTECTOMY COMPLETE / SIMPLE W/ SENTINEL NODE BIOPSY Right 04/12/2016   axillary sentinel LNB  . MASTECTOMY W/ SENTINEL NODE BIOPSY Bilateral 04/12/2016   Procedure: BILATERAL TOTAL MASTECTOMIES  WITH RIGHT SENTINEL LYMPH NODE BIOPSY;  Surgeon: [name], MD;  Location: [facility];  Service: General;  Laterality: Bilateral;  . PORTACATH PLACEMENT Right 05/19/2016   Procedure: INSERTION PORT-A-CATH WITH [place];  Surgeon: [name], MD;  Location: [facility];  Service: General;  Laterality: Right;    SOCIAL HISTORY: Social History   Social History  . Marital status: Married    Spouse name: N/A  . Number of children: N/A  . Years of education: N/A   Occupational  History  . Not on file.   Social History Main Topics  . Smoking status: Former Smoker    Packs/day: 1.00    Years: 25.00    Quit date: 02/08/1996  . Smokeless tobacco: Never Used  . Alcohol use 13.2 oz/week    15 Standard drinks or equivalent, 7 Glasses of wine per week  . Drug use: No  . Sexual activity: No   Other Topics Concern  . Not on file   Social History Narrative  . No narrative on file    FAMILY HISTORY: Family History  Problem Relation Age of Onset  . Polymyalgia rheumatica Mother   . Hypertension Father   . Heart disease Father        Dec 69  . Heart attack Father   . Prostate cancer Father        Dx 48s; deceased 24  . Breast cancer Cousin        Dx 35s; daughter of a paternal uncle  . Ovarian cancer Paternal Aunt        Dx 27; deceased  . Ovarian cancer Cousin        Dx 19s; daughter of a paternal uncle  . Ovarian cancer Cousin        Dx 50; daughter of a paternal uncle  . Ovarian cancer Paternal Aunt          Dx 51s.  BRCA positive  . Ovarian cancer Other        pat grandfather's mother    ALLERGIES:  has No Known Allergies.  MEDICATIONS:  Current Outpatient Prescriptions  Medication Sig Dispense Refill  . acyclovir ointment (ZOVIRAX) 5 % Apply 1 application topically every 3 (three) hours. 15 g 1  . aspirin EC 81 MG tablet Take 81 mg by mouth daily.    . cetirizine (ZYRTEC) 10 MG tablet Take 10 mg by mouth at bedtime.     . Cholecalciferol (VITAMIN D) 2000 units tablet Take 2,000 Units by mouth daily.    . cloNIDine (CATAPRES) 0.2 MG tablet Take 1 tablet (0.2 mg total) by mouth at bedtime. 90 tablet 0  . escitalopram (LEXAPRO) 20 MG tablet TAKE 1 TABLET(20 MG) BY MOUTH DAILY 90 tablet 0  . Flaxseed, Linseed, (FLAXSEED OIL PO) Take 1,400 mg by mouth every evening.    . fluticasone (FLONASE ALLERGY RELIEF) 50 MCG/ACT nasal spray Place 1 spray into both nostrils daily.    Marland Kitchen lidocaine-prilocaine (EMLA) cream Apply 1 application topically as needed. 30 g 3  . losartan (COZAAR) 100 MG tablet Take 1 tablet (100 mg total) by mouth daily. 30 tablet 11  . magnesium oxide (MAG-OX) 400 MG tablet Take 400 mg by mouth every other day.    . Multiple Vitamins-Minerals (MULTIVITAMIN WITH MINERALS) tablet Take 1 tablet by mouth every evening.     . Omega-3 Fatty Acids ([employer] FISH OIL) 1200 MG CAPS Take 1,200 mg by mouth every evening.     . venlafaxine XR (EFFEXOR-XR) 75 MG 24 hr capsule TAKE 2 CAPSULES BY MOUTH EVERY MORNING AND 1 CAPSULE EVERY EVENING 270 capsule 1  . vitamin B-12 (CYANOCOBALAMIN) 100 MCG tablet Take 100 mcg by mouth every other day.     Marland Kitchen acetaminophen (TYLENOL) 500 MG tablet Take 500-1,000 mg by mouth every 6 (six) hours as needed for mild pain or headache (depends on pain if takes 1-2 tablets).    . montelukast (SINGULAIR) 10 MG tablet TAKE 1 TABLET(10 MG) BY MOUTH DAILY (Patient not taking:  Reported on 08/11/2016) 90 tablet 0  . ondansetron (ZOFRAN) 8 MG tablet Take 1 tablet (8 mg  total) by mouth 2 (two) times daily as needed. Start on the third day after chemotherapy. (Patient not taking: Reported on 08/11/2016) 30 tablet 1  . prochlorperazine (COMPAZINE) 10 MG tablet Take 1 tablet (10 mg total) by mouth every 6 (six) hours as needed (Nausea or vomiting). (Patient not taking: Reported on 08/11/2016) 30 tablet 1  . valACYclovir (VALTREX) 500 MG tablet Take 1 tablet (500 mg total) by mouth 2 (two) times daily. Take for 3 days as needed. (Patient not taking: Reported on 08/11/2016) 30 tablet 1   No current facility-administered medications for this visit.    Facility-Administered Medications Ordered in Other Visits  Medication Dose Route Frequency Provider Last Rate Last Dose  . sodium chloride flush (NS) 0.9 % injection 10 mL  10 mL Intravenous PRN Truitt Merle, MD   10 mL at 06/30/16 0844    REVIEW OF SYSTEMS:   Constitutional: Denies fevers, chills or abnormal night sweats  (+) fatigue  Eyes: Denies blurriness of vision, double vision or watery eyes Ears, nose, mouth, throat, and face: Denies mucositis or sore throat Respiratory: Denies cough, dyspnea or wheezes Cardiovascular: Denies palpitation, chest discomfort or lower extremity swelling Gastrointestinal:  Denies heartburn  Skin:  (+)darkening of nails Lymphatics: Denies new lymphadenopathy or easy bruising Neurological:Denies numbness, tingling or new weaknesses Behavioral/Psych: Mood is stable, no new changes  All other systems were reviewed with the patient and are negative.  PHYSICAL EXAMINATION:  ECOG PERFORMANCE STATUS: 0 - Asymptomatic  Vitals:   08/11/16 1357  BP: (!) 149/97  Pulse: 71  Resp: 20  Temp: 98.2 F (36.8 C)  TempSrc: Oral  SpO2: 100%  Weight: 154 lb 4.8 oz (70 kg)  Height: 5' 4"  (1.626 m)     GENERAL:alert, no distress and comfortable SKIN: skin color, texture, turgor are normal, no rashes or significant lesions, Dryness pof skin and color changes to nail nail bed. EYES: normal,  conjunctiva are pink and non-injected, sclera clear OROPHARYNX:no exudate, no erythema and lips, buccal mucosa, and tongue normal, no oral thrush NECK: supple, thyroid normal size, non-tender, without nodularity LYMPH:  no palpable lymphadenopathy in the cervical, axillary or inguinal LUNGS: clear to auscultation and percussion with normal breathing effort HEART: regular rate & rhythm and no murmurs and no lower extremity edema ABDOMEN:abdomen soft, non-tender and normal bowel sounds Musculoskeletal:no cyanosis of digits and no clubbing  PSYCH: alert & oriented x 3 with fluent speech NEURO: no focal motor/sensory deficits Breasts: Breast inspection showed Status post bilateral mastectomy, surgical incisions have healed well with no discharge or surrounding skin Erythema.   LABORATORY DATA:  I have reviewed the data as listed CBC Latest Ref Rng & Units 08/11/2016 08/04/2016 07/28/2016  WBC 3.9 - 10.3 10e3/uL 4.6 4.0 2.7(L)  Hemoglobin 11.6 - 15.9 g/dL 10.4(L) 10.7(L) 10.0(L)  Hematocrit 34.8 - 46.6 % 31.0(L) 31.6(L) 29.9(L)  Platelets 145 - 400 10e3/uL 240 244 196    CMP Latest Ref Rng & Units 08/11/2016 08/04/2016 07/28/2016  Glucose 70 - 140 mg/dl 91 95 86  BUN 7.0 - 26.0 mg/dL 15.1 14.0 11.9  Creatinine 0.6 - 1.1 mg/dL 0.7 0.6 0.6  Sodium 136 - 145 mEq/L 136 141 142  Potassium 3.5 - 5.1 mEq/L 4.2 4.3 4.4  Chloride 101 - 111 mmol/L - - -  CO2 22 - 29 mEq/L 27 25 26   Calcium 8.4 - 10.4 mg/dL 9.4  9.6 9.1  Total Protein 6.4 - 8.3 g/dL 6.4 6.5 6.3(L)  Total Bilirubin 0.20 - 1.20 mg/dL 0.44 0.31 0.24  Alkaline Phos 40 - 150 U/L 80 88 79  AST 5 - 34 U/L 18 22 22   ALT 0 - 55 U/L 16 23 23     PATHOLOGY  Diagnosis 04/12/16 1. Breast, simple mastectomy, Left Prophylactic Total - BREAST PARENCHYMA SHOWING FIBROCYSTIC CHANGES WITH APOCRINE METAPLASIA AND FLORID USUAL DUCTAL HYPERPLASIA. - FIBROADENOMA. - ONE BENIGN LYMPH NODE (0/1). - NO ATYPIA OR TUMOR SEEN. - GROSSLY UNREMARKABLE SKIN. 2.  Breast, simple mastectomy, Right Total - INVASIVE, GRADE 2 DUCTAL CARCINOMA, SPANNING 1.7 CM IN GREATEST DIMENSION. - MARGINS ARE NEGATIVE. - GROSSLY UNREMARKABLE SKIN. - SEE ONCOLOGY TEMPLATE. 3. Lymph node, sentinel, biopsy, Right Axillary #1 - ONE BENIGN WITH NO TUMOR SEEN (0/1). - SEE COMMENT. 4. Lymph node, sentinel, biopsy, Right - ONE BENIGN WITH NO TUMOR SEEN (0/1). - SEE COMMENT. 5. Lymph node, sentinel, biopsy, Right - ONE BENIGN WITH NO TUMOR SEEN (0/1). - SEE COMMENT.  Diagnosis 03/08/2016 1. Breast, right, needle core biopsy, 12:00 o'clock - INVASIVE MAMMARY CARCINOMA. - SEE COMMENT. 2. Lymph node, needle/core biopsy, right axilla - THERE IS NO EVIDENCE OF CARCINOMA IN 1 OF 1 LYMPH NODE (0/1).   PROCEDURES  ECHO 05/10/16 Study Conclusions - Left ventricle: The cavity size was normal. Systolic function was   normal. The estimated ejection fraction was in the range of 55%   to 60%. Wall motion was normal; there were no regional wall   motion abnormalities. Doppler parameters are consistent with   abnormal left ventricular relaxation (grade 1 diastolic   dysfunction). There was no evidence of elevated ventricular   filling pressure by Doppler parameters. - Aortic valve: Trileaflet; normal thickness leaflets. There was no   regurgitation. - Aortic root: The aortic root was normal in size. - Mitral valve: Structurally normal valve. There was no   regurgitation. - Right ventricle: The cavity size was normal. Wall thickness was   normal. Systolic function was normal. - Tricuspid valve: There was trivial regurgitation. - Pulmonary arteries: Systolic pressure was within the normal   range. - Inferior vena cava: The vessel was normal in size. - Pericardium, extracardiac: There was no pericardial effusion.   RADIOGRAPHIC STUDIES: I have personally reviewed the radiological images as listed and agreed with the findings in the report. No results found.   Bilateral  mammogram and [place] 03/04/16 IMPRESSION: 1. 1.7 cm mass in the 12 o'clock position of the right breast with imaging features highly suspicious for malignancy. 2. Borderline abnormal right axillary lymph node, suspicious for a metastatic node.  Screening mammogram 02/29/16 IMPRESSION: Further evaluation is suggested for possible architectural distortion and asymmetry in the right breast.  ASSESSMENT & PLAN:  61 y.o. postmenopausal woman, presented with screening discovered right breast cancer  1. 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 Oncotype RS 49, high risk  - Patient has had bilateral total mastectomy on 04/12/16 and denied reconstruction.  -I previously reviewed her surgical pathology findings with patient and her husband in details. Her surgical margins were negative, 3 sentinel lymph nodes were also negative. - Her oncotype is back at 49. This is fairly high risk disease based on the RS. the estimated 10 year risk of distant recurrence with tamoxifen alone is 32%. I previously, strongly recommend chemotherapy to reduce her risk of cancer  recurrence after surgery (by ~50%). -she previously started adjuvant chemotherapy with dose dense Adriamycin and Cytoxan - Baseline Echo on 05/10/16 showed EF 55-60%. -she has previously been tolerating chemo well, mild fatigue, no other significant side effects  -  f/u every 2-3 weeks - I previously reduced her steroid dosage to 10 mg  - I previously encouraged her to continue taking multivitamins -Labs reviewed. Anemia 10.4 today. She is adequate to continue with weekly taxol today, she is tolerating well  -f/u in 2 and 4 weeks  2. BRCA1 mutation (+) -Due to her strong family history of ovarian cancer and also positive family history of breast cancer, she underwent genetic testing - Patient is positive for BRCA1 Mutation -We previously discussed  the high risk of breast cancer and colon cancer due to the BRCA1 mutation. She is status post bilateral mastectomy. She is agreeable to have BSO, she has discussed with her gynecologist. I have recommended her to have this surgery done after she completes adjuvant chemotherapy. - Her sister has done genetic testing, results pending -She has no children.  3. 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 The patient knows to call the clinic with any problems, questions or concerns. I spent 15 minutes counseling the patient face to face. The total time spent in the appointment was 20 minutes and more than 50% was on counseling.  This document serves as a record of services personally performed by Truitt Merle, MD. It was created on her behalf by Brandt Loosen, a trained medical scribe. The creation of this record is based on the scribe's personal observations and the provider's statements to them. This document has been checked and approved by the attending provider.    Truitt Merle, MD 08/11/2016

## 2016-08-11 ENCOUNTER — Ambulatory Visit: Payer: BLUE CROSS/BLUE SHIELD

## 2016-08-11 ENCOUNTER — Other Ambulatory Visit (HOSPITAL_BASED_OUTPATIENT_CLINIC_OR_DEPARTMENT_OTHER): Payer: BLUE CROSS/BLUE SHIELD

## 2016-08-11 ENCOUNTER — Encounter: Payer: Self-pay | Admitting: *Deleted

## 2016-08-11 ENCOUNTER — Telehealth: Payer: Self-pay | Admitting: Hematology

## 2016-08-11 ENCOUNTER — Ambulatory Visit (HOSPITAL_BASED_OUTPATIENT_CLINIC_OR_DEPARTMENT_OTHER): Payer: BLUE CROSS/BLUE SHIELD | Admitting: Hematology

## 2016-08-11 ENCOUNTER — Other Ambulatory Visit: Payer: BLUE CROSS/BLUE SHIELD

## 2016-08-11 ENCOUNTER — Ambulatory Visit (HOSPITAL_BASED_OUTPATIENT_CLINIC_OR_DEPARTMENT_OTHER): Payer: BLUE CROSS/BLUE SHIELD

## 2016-08-11 VITALS — BP 149/97 | HR 71 | Temp 98.2°F | Resp 20 | Ht 64.0 in | Wt 154.3 lb

## 2016-08-11 DIAGNOSIS — Z5111 Encounter for antineoplastic chemotherapy: Secondary | ICD-10-CM | POA: Diagnosis not present

## 2016-08-11 DIAGNOSIS — C50111 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 female breast: Secondary | ICD-10-CM

## 2016-08-11 DIAGNOSIS — D6481 Anemia due to antineoplastic chemotherapy: Secondary | ICD-10-CM | POA: Diagnosis not present

## 2016-08-11 DIAGNOSIS — Z170 Estrogen receptor positive status [ER+]: Secondary | ICD-10-CM

## 2016-08-11 DIAGNOSIS — I10 Essential (primary) hypertension: Secondary | ICD-10-CM

## 2016-08-11 DIAGNOSIS — Z1509 Genetic susceptibility to other malignant neoplasm: Secondary | ICD-10-CM

## 2016-08-11 DIAGNOSIS — Z95828 Presence of other vascular implants and grafts: Secondary | ICD-10-CM

## 2016-08-11 DIAGNOSIS — Z1501 Genetic susceptibility to malignant neoplasm of breast: Secondary | ICD-10-CM

## 2016-08-11 LAB — COMPREHENSIVE METABOLIC PANEL
ALT: 16 U/L (ref 0–55)
AST: 18 U/L (ref 5–34)
Albumin: 4 g/dL (ref 3.5–5.0)
Alkaline Phosphatase: 80 U/L (ref 40–150)
Anion Gap: 5 mEq/L (ref 3–11)
BUN: 15.1 mg/dL (ref 7.0–26.0)
CALCIUM: 9.4 mg/dL (ref 8.4–10.4)
CHLORIDE: 103 meq/L (ref 98–109)
CO2: 27 meq/L (ref 22–29)
CREATININE: 0.7 mg/dL (ref 0.6–1.1)
EGFR: >90 mL/min/{1.73_m2} (ref >90)
GLUCOSE: 91 mg/dL (ref 70–140)
POTASSIUM: 4.2 meq/L (ref 3.5–5.1)
SODIUM: 136 meq/L (ref 136–145)
Total Bilirubin: 0.44 mg/dL (ref 0.20–1.20)
Total Protein: 6.4 g/dL (ref 6.4–8.3)

## 2016-08-11 LAB — CBC WITH DIFFERENTIAL/PLATELET
BASO%: 0.7 % (ref 0.0–2.0)
BASOS ABS: 0 10*3/uL (ref 0.0–0.1)
EOS%: 2.2 % (ref 0.0–7.0)
Eosinophils Absolute: 0.1 10*3/uL (ref 0.0–0.5)
HCT: 31 % — ABNORMAL LOW (ref 34.8–46.6)
HGB: 10.4 g/dL — ABNORMAL LOW (ref 11.6–15.9)
LYMPH#: 1 10*3/uL (ref 0.9–3.3)
LYMPH%: 21.1 % (ref 14.0–49.7)
MCH: 33.9 pg (ref 25.1–34.0)
MCHC: 33.5 g/dL (ref 31.5–36.0)
MCV: 101 fL (ref 79.5–101.0)
MONO#: 0.6 10*3/uL (ref 0.1–0.9)
MONO%: 12.1 % (ref 0.0–14.0)
NEUT#: 2.9 10*3/uL (ref 1.5–6.5)
NEUT%: 63.9 % (ref 38.4–76.8)
PLATELETS: 240 10*3/uL (ref 145–400)
RBC: 3.07 10*6/uL — AB (ref 3.70–5.45)
RDW: 17.7 % — ABNORMAL HIGH (ref 11.2–14.5)
WBC: 4.6 10*3/uL (ref 3.9–10.3)

## 2016-08-11 MED ORDER — DEXAMETHASONE SODIUM PHOSPHATE 10 MG/ML IJ SOLN
10.0000 mg | Freq: Once | INTRAMUSCULAR | Status: AC
  Administered 2016-08-11: 10 mg via INTRAVENOUS

## 2016-08-11 MED ORDER — SODIUM CHLORIDE 0.9 % IV SOLN
Freq: Once | INTRAVENOUS | Status: AC
  Administered 2016-08-11: 15:00:00 via INTRAVENOUS

## 2016-08-11 MED ORDER — HEPARIN SOD (PORK) LOCK FLUSH 100 UNIT/ML IV SOLN
500.0000 [IU] | Freq: Once | INTRAVENOUS | Status: AC | PRN
  Administered 2016-08-11: 500 [IU]
  Filled 2016-08-11: qty 5

## 2016-08-11 MED ORDER — DIPHENHYDRAMINE HCL 50 MG/ML IJ SOLN
25.0000 mg | Freq: Once | INTRAMUSCULAR | Status: AC
  Administered 2016-08-11: 25 mg via INTRAVENOUS

## 2016-08-11 MED ORDER — SODIUM CHLORIDE 0.9% FLUSH
10.0000 mL | INTRAVENOUS | Status: DC | PRN
  Administered 2016-08-11: 10 mL via INTRAVENOUS
  Filled 2016-08-11: qty 10

## 2016-08-11 MED ORDER — FAMOTIDINE IN NACL 20-0.9 MG/50ML-% IV SOLN
INTRAVENOUS | Status: AC
  Filled 2016-08-11: qty 50

## 2016-08-11 MED ORDER — DEXAMETHASONE SODIUM PHOSPHATE 10 MG/ML IJ SOLN
INTRAMUSCULAR | Status: AC
  Filled 2016-08-11: qty 1

## 2016-08-11 MED ORDER — PACLITAXEL CHEMO INJECTION 300 MG/50ML
80.0000 mg/m2 | Freq: Once | INTRAVENOUS | Status: AC
  Administered 2016-08-11: 144 mg via INTRAVENOUS
  Filled 2016-08-11: qty 24

## 2016-08-11 MED ORDER — SODIUM CHLORIDE 0.9% FLUSH
10.0000 mL | INTRAVENOUS | Status: DC | PRN
  Administered 2016-08-11: 10 mL
  Filled 2016-08-11: qty 10

## 2016-08-11 MED ORDER — FAMOTIDINE IN NACL 20-0.9 MG/50ML-% IV SOLN
20.0000 mg | Freq: Once | INTRAVENOUS | Status: AC
  Administered 2016-08-11: 20 mg via INTRAVENOUS

## 2016-08-11 MED ORDER — DIPHENHYDRAMINE HCL 50 MG/ML IJ SOLN
INTRAMUSCULAR | Status: AC
  Filled 2016-08-11: qty 1

## 2016-08-11 NOTE — Patient Instructions (Signed)

## 2016-08-11 NOTE — Patient Instructions (Signed)
[facility] Discharge Instructions for Patients Receiving Chemotherapy  Today you received the following chemotherapy agents Taxol   To help prevent nausea and vomiting after your treatment, we encourage you to take your nausea medication as directed.   If you develop nausea and vomiting that is not controlled by your nausea medication, call the clinic.   BELOW ARE SYMPTOMS THAT SHOULD BE REPORTED IMMEDIATELY:  *FEVER GREATER THAN 100.5 F  *CHILLS WITH OR WITHOUT FEVER  NAUSEA AND VOMITING THAT IS NOT CONTROLLED WITH YOUR NAUSEA MEDICATION  *UNUSUAL SHORTNESS OF BREATH  *UNUSUAL BRUISING OR BLEEDING  TENDERNESS IN MOUTH AND THROAT WITH OR WITHOUT PRESENCE OF ULCERS  *URINARY PROBLEMS  *BOWEL PROBLEMS  UNUSUAL RASH Items with * indicate a potential emergency and should be followed up as soon as possible.  Feel free to call the clinic you have any questions or concerns. The clinic phone number is [phone].  Please show the CHEMO ALERT CARD at check-in to the Emergency Department and triage nurse.   

## 2016-08-11 NOTE — Telephone Encounter (Signed)
Scheduled appt per 7/5 los - Gave patient AVS and calender per los.  

## 2016-08-13 ENCOUNTER — Encounter: Payer: Self-pay | Admitting: Hematology

## 2016-08-16 DIAGNOSIS — H40013 Open angle with borderline findings, low risk, bilateral: Secondary | ICD-10-CM | POA: Diagnosis not present

## 2016-08-18 ENCOUNTER — Ambulatory Visit: Payer: BLUE CROSS/BLUE SHIELD

## 2016-08-18 ENCOUNTER — Other Ambulatory Visit (HOSPITAL_BASED_OUTPATIENT_CLINIC_OR_DEPARTMENT_OTHER): Payer: BLUE CROSS/BLUE SHIELD

## 2016-08-18 ENCOUNTER — Ambulatory Visit (HOSPITAL_BASED_OUTPATIENT_CLINIC_OR_DEPARTMENT_OTHER): Payer: BLUE CROSS/BLUE SHIELD

## 2016-08-18 VITALS — BP 132/91 | HR 74 | Temp 98.0°F | Resp 18

## 2016-08-18 DIAGNOSIS — C50111 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 female breast: Secondary | ICD-10-CM

## 2016-08-18 DIAGNOSIS — Z5111 Encounter for antineoplastic chemotherapy: Secondary | ICD-10-CM

## 2016-08-18 DIAGNOSIS — Z170 Estrogen receptor positive status [ER+]: Principal | ICD-10-CM

## 2016-08-18 DIAGNOSIS — Z95828 Presence of other vascular implants and grafts: Secondary | ICD-10-CM

## 2016-08-18 LAB — CBC WITH DIFFERENTIAL/PLATELET
BASO%: 1.3 % (ref 0.0–2.0)
BASOS ABS: 0 10*3/uL (ref 0.0–0.1)
EOS ABS: 0.1 10*3/uL (ref 0.0–0.5)
EOS%: 4.1 % (ref 0.0–7.0)
HEMATOCRIT: 31.9 % — AB (ref 34.8–46.6)
HEMOGLOBIN: 10.7 g/dL — AB (ref 11.6–15.9)
LYMPH#: 0.8 10*3/uL — AB (ref 0.9–3.3)
LYMPH%: 24.4 % (ref 14.0–49.7)
MCH: 34.2 pg — AB (ref 25.1–34.0)
MCHC: 33.5 g/dL (ref 31.5–36.0)
MCV: 101.9 fL — AB (ref 79.5–101.0)
MONO#: 0.5 10*3/uL (ref 0.1–0.9)
MONO%: 14.4 % — AB (ref 0.0–14.0)
NEUT%: 55.8 % (ref 38.4–76.8)
NEUTROS ABS: 1.8 10*3/uL (ref 1.5–6.5)
PLATELETS: 217 10*3/uL (ref 145–400)
RBC: 3.13 10*6/uL — ABNORMAL LOW (ref 3.70–5.45)
RDW: 16.8 % — AB (ref 11.2–14.5)
WBC: 3.2 10*3/uL — AB (ref 3.9–10.3)

## 2016-08-18 LAB — COMPREHENSIVE METABOLIC PANEL
ALBUMIN: 3.9 g/dL (ref 3.5–5.0)
ALK PHOS: 74 U/L (ref 40–150)
ALT: 17 U/L (ref 0–55)
AST: 18 U/L (ref 5–34)
Anion Gap: 8 mEq/L (ref 3–11)
BILIRUBIN TOTAL: 0.44 mg/dL (ref 0.20–1.20)
BUN: 14.4 mg/dL (ref 7.0–26.0)
CALCIUM: 9.4 mg/dL (ref 8.4–10.4)
CO2: 26 mEq/L (ref 22–29)
Chloride: 105 mEq/L (ref 98–109)
Creatinine: 0.7 mg/dL (ref 0.6–1.1)
GLUCOSE: 85 mg/dL (ref 70–140)
POTASSIUM: 4.1 meq/L (ref 3.5–5.1)
SODIUM: 139 meq/L (ref 136–145)
TOTAL PROTEIN: 6.5 g/dL (ref 6.4–8.3)

## 2016-08-18 MED ORDER — FAMOTIDINE IN NACL 20-0.9 MG/50ML-% IV SOLN
INTRAVENOUS | Status: AC
  Filled 2016-08-18: qty 50

## 2016-08-18 MED ORDER — SODIUM CHLORIDE 0.9% FLUSH
10.0000 mL | INTRAVENOUS | Status: DC | PRN
  Administered 2016-08-18: 10 mL via INTRAVENOUS
  Filled 2016-08-18: qty 10

## 2016-08-18 MED ORDER — FAMOTIDINE IN NACL 20-0.9 MG/50ML-% IV SOLN
20.0000 mg | Freq: Once | INTRAVENOUS | Status: AC
Start: 2016-08-18 — End: 2016-08-18
  Administered 2016-08-18: 20 mg via INTRAVENOUS

## 2016-08-18 MED ORDER — DIPHENHYDRAMINE HCL 50 MG/ML IJ SOLN
25.0000 mg | Freq: Once | INTRAMUSCULAR | Status: AC
Start: 2016-08-18 — End: 2016-08-18
  Administered 2016-08-18: 25 mg via INTRAVENOUS

## 2016-08-18 MED ORDER — DEXAMETHASONE SODIUM PHOSPHATE 10 MG/ML IJ SOLN
INTRAMUSCULAR | Status: AC
  Filled 2016-08-18: qty 1

## 2016-08-18 MED ORDER — DEXAMETHASONE SODIUM PHOSPHATE 10 MG/ML IJ SOLN
10.0000 mg | Freq: Once | INTRAMUSCULAR | Status: AC
  Administered 2016-08-18: 10 mg via INTRAVENOUS

## 2016-08-18 MED ORDER — SODIUM CHLORIDE 0.9% FLUSH
10.0000 mL | INTRAVENOUS | Status: DC | PRN
  Administered 2016-08-18: 10 mL
  Filled 2016-08-18: qty 10

## 2016-08-18 MED ORDER — DIPHENHYDRAMINE HCL 50 MG/ML IJ SOLN
INTRAMUSCULAR | Status: AC
  Filled 2016-08-18: qty 1

## 2016-08-18 MED ORDER — PACLITAXEL CHEMO INJECTION 300 MG/50ML
80.0000 mg/m2 | Freq: Once | INTRAVENOUS | Status: AC
  Administered 2016-08-18: 144 mg via INTRAVENOUS
  Filled 2016-08-18: qty 24

## 2016-08-18 MED ORDER — SODIUM CHLORIDE 0.9 % IV SOLN
Freq: Once | INTRAVENOUS | Status: AC
  Administered 2016-08-18: 10:00:00 via INTRAVENOUS

## 2016-08-18 MED ORDER — HEPARIN SOD (PORK) LOCK FLUSH 100 UNIT/ML IV SOLN
500.0000 [IU] | Freq: Once | INTRAVENOUS | Status: AC | PRN
  Administered 2016-08-18: 500 [IU]
  Filled 2016-08-18: qty 5

## 2016-08-18 NOTE — Patient Instructions (Signed)

## 2016-08-18 NOTE — Patient Instructions (Signed)
[facility] Discharge Instructions for Patients Receiving Chemotherapy  Today you received the following chemotherapy agents Taxol   To help prevent nausea and vomiting after your treatment, we encourage you to take your nausea medication as directed.   If you develop nausea and vomiting that is not controlled by your nausea medication, call the clinic.   BELOW ARE SYMPTOMS THAT SHOULD BE REPORTED IMMEDIATELY:  *FEVER GREATER THAN 100.5 F  *CHILLS WITH OR WITHOUT FEVER  NAUSEA AND VOMITING THAT IS NOT CONTROLLED WITH YOUR NAUSEA MEDICATION  *UNUSUAL SHORTNESS OF BREATH  *UNUSUAL BRUISING OR BLEEDING  TENDERNESS IN MOUTH AND THROAT WITH OR WITHOUT PRESENCE OF ULCERS  *URINARY PROBLEMS  *BOWEL PROBLEMS  UNUSUAL RASH Items with * indicate a potential emergency and should be followed up as soon as possible.  Feel free to call the clinic you have any questions or concerns. The clinic phone number is [phone].  Please show the CHEMO ALERT CARD at check-in to the Emergency Department and triage nurse.   

## 2016-08-19 NOTE — Addendum Note (Signed)
Addendum  created 08/19/16 1124 by Lyndle Herrlich, MD   Sign clinical note

## 2016-08-19 NOTE — Anesthesia Postprocedure Evaluation (Signed)
Anesthesia Post Note  Patient: Chelsea Salinas  Procedure(s) Performed: Procedure(s) (LRB): INSERTION PORT-A-CATH WITH [place] (Right)     Anesthesia Post Evaluation  Last Vitals:  Vitals:   05/19/16 0930 05/19/16 0940  BP: 135/88 137/90  Pulse: 69 73  Resp: 11 14  Temp: 36.6 C     Last Pain:  Vitals:   05/19/16 0930  TempSrc:   PainSc: 0-No pain                 Dariya Gainer EDWARD

## 2016-08-22 NOTE — Progress Notes (Signed)
[place]  [phone]) [phone] [phone]  Clinic Follow Up Note   Patient Care Team: [name], MD as PCP - General (Internal Medicine) Verl Blalock, Marijo Conception, MD (Inactive) as Consulting Physician (Cardiology) [name], MD as Consulting Physician (Dermatology) 08/25/2016    CHIEF COMPLAINTS:  Follow up right breast cancer  Oncology History   Cancer Staging 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 breast in female, estrogen receptor positive ([place]) Staging form: Breast, AJCC 8th Edition - Clinical stage from 03/08/2016: Stage IA (cT1c, cN0, cM0, G2, ER: Positive, PR: Negative, HER2: Negative) - Signed by Truitt Merle, MD on 03/15/2016 - Pathologic stage from 04/12/2016: Stage IA (pT1c, pN0, cM0, G2, ER: Positive, PR: Negative, HER2: Negative, Oncotype DX score: 49) - Signed by Truitt Merle, MD on 05/05/2016       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 breast in female, estrogen receptor positive ([place])   03/04/2016 Mammogram    Diagnostic bilateral mammogram and right breast ultrasound showed a 1.7 cm mass in the 12:00 position of the right breast, highly suspicious for malignancy. There is a borderline abnormal right axillary lymph node, also suspicious for metastasis.       03/08/2016 Initial Diagnosis    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 breast in female, estrogen receptor positive ([place])      03/08/2016 Initial Biopsy    Right breast 12:00 core needle biopsy showed invasive ductal carcinoma, grade 2, right axillary lymph node biopsy was negative.      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  Genes Analyzed: 43 genes on [facility]'s Common Cancers panel (APC, ATM, AXIN2, BARD1, BMPR1A, BRCA1, BRCA2, BRIP1, CDH1, CDKN2A, CHEK2, DICER1, EPCAM, GREM1, HOXB13, KIT, MEN1, MLH1, MSH2, MSH6,  MUTYH, NBN, NF1, PALB2, PDGFRA, PMS2, POLD1, POLE, PTEN, RAD50, RAD51C, RAD51D, SDHA, SDHB, SDHC, SDHD, SMAD4, SMARCA4, STK11, TP53, TSC1, TSC2, VHL).       04/12/2016 Surgery    Bilateral mastectomy and right sentinel lymph node biopsy, by [name]      04/12/2016 Pathology Results    Right breast mastectomy showed invasive grade 2 invasive ductal carcinoma, 1.7 cm, margins were negative, 3 sentinel lymph nodes were negative, left simple mastectomy fibroadenoma, one benign node, no atypia or tumor.       05/04/2016 Oncotype testing    Recurrence score 49, high-risk, predicts 10 year risk of distant recurrence 32% with tamoxifen alone.      05/10/2016 Echocardiogram    Echo on 05/10/16 showed EF 55-60%.      05/20/2016 -  Chemotherapy    dose dense Adriamycin and Cytoxan, every 2 weeks starting 05/20/16 for 4 cycles, followed by Taxol starting 07/14/16 (weekly for 12 weeks or biweekly for 4 cycles) ending 09/30/16.       Breast cancer, right ([place])   04/12/2016 Initial Diagnosis    Breast cancer, right ([place])      HISTORY OF PRESENTING ILLNESS (03/16/16):  Chelsea Salinas 61 y.o. female is here because of recently diagnosed right breast invasive mammary carcinoma. She is accompanied by her husband to our multidisciplinary breast clinic today.  The patient underwent routine screening mammogram on 02/29/2016 and was found to have possible architectural distortion and asymmetry in the right breast. Also found was asymmetry in the left breast.  Accordingly a bilateral diagnostic mammogram and unilateral  right breast ultrasound were performed on 03/04/2016. These scans showed a 1.7 cm mass in the 12 o'clock position of the right breast with imaging features highly suspicious for malignancy. Also seen was borderline abnormal right axillary lymph node, suspicious for a metastatic node.  Biopsy of the right breast 12:00 o'clock position on 03/08/2016 revealed invasive mammary carcinoma. The carcinoma  appears to be at least grade 2, ER positive, PR negative, HER-2 negative, Ki67 20%. The malignant cells are positive for E-Cadherin, supporting a ductal phenotype. Biopsy of the right axillary lymph node showed no evidence of carcinoma.   The patient reports to the clinic today for follow up and to discuss chemotherapy. She has had surgery about 3 weeks ago and she has been doing well. She started antibiotics yesterday to help with infection that she has recently gotten. She has also started doing PT this week. She decided not to do reconstruction. She has been eating well.    GYN HISTORY  Menarchal:12 LMP: late 16's (hot flushes since mid 40's)  Contraceptive: 23 HRT: 3-4 years  G0P0:  CURRENT THERAPY: dose dense Adriamycin and Cytoxan, every 2 weeks starting 05/20/16 for 4 cycles, followed by weekly Taxol for 12 weeks starting 07/14/16   INTERVAL HISTORY:   Chelsea Salinas returns for follow up and weekly Taxol. She is receiving cycle 5. She presents to the clinic today with her husband. She has some rashes on her right arm that has not worsened since her chemo treatment. She has been very active lately. She has some fatigue at times. She denies any other pains, numbness or tingling or other concerns.   MEDICAL HISTORY:  Past Medical History:  Diagnosis Date  . Abnormal Pap smear of cervix    --in her 20s had colpo and some type of treatment to cervix.  . Allergy   . Asthma    "all the time, throughout the yr"  . 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 Breast cancer, right breast ([place]) 02/2016  . Eczema   . Family history of breast cancer   . Family history of genetic disease carrier    paternal relatives with a BRCA mutation  . Family history of ovarian cancer   . Hypertension   . Pneumonia    2000ish  . STD (sexually transmitted disease)    HSV II    SURGICAL HISTORY: Past Surgical History:  Procedure Laterality Date  . BREAST BIOPSY Right  04/13/2016   Procedure: Evacuation RIGHT Breast  Hematoma;  Surgeon: [name], MD;  Location: [facility];  Service: General;  Laterality: Right;  . COLONOSCOPY    . DILATION AND CURETTAGE OF UTERUS  2008  . MASTECTOMY Left 04/12/2016   prophylactic total mastectomy  . MASTECTOMY COMPLETE / SIMPLE W/ SENTINEL NODE BIOPSY Right 04/12/2016   axillary sentinel LNB  . MASTECTOMY W/ SENTINEL NODE BIOPSY Bilateral 04/12/2016   Procedure: BILATERAL TOTAL MASTECTOMIES  WITH RIGHT SENTINEL LYMPH NODE BIOPSY;  Surgeon: [name], MD;  Location: [facility];  Service: General;  Laterality: Bilateral;  . PORTACATH PLACEMENT Right 05/19/2016   Procedure: INSERTION PORT-A-CATH WITH [place];  Surgeon: [name], MD;  Location: [facility];  Service: General;  Laterality: Right;    SOCIAL HISTORY: Social History   Social History  . Marital status: Married    Spouse name: N/A  . Number of children: N/A  . Years of education: N/A   Occupational History  . Not on file.  Social History Main Topics  . Smoking status: Former Smoker    Packs/day: 1.00    Years: 25.00    Quit date: 02/08/1996  . Smokeless tobacco: Never Used  . Alcohol use 13.2 oz/week    15 Standard drinks or equivalent, 7 Glasses of wine per week  . Drug use: No  . Sexual activity: No   Other Topics Concern  . Not on file   Social History Narrative  . No narrative on file    FAMILY HISTORY: Family History  Problem Relation Age of Onset  . Polymyalgia rheumatica Mother   . Hypertension Father   . Heart disease Father        Dec 69  . Heart attack Father   . Prostate cancer Father        Dx 63s; deceased 62  . Breast cancer Cousin        Dx 52s; daughter of a paternal uncle  . Ovarian cancer Paternal Aunt        Dx 38; deceased  . Ovarian cancer Cousin        Dx 10s; daughter of a paternal uncle  . Ovarian cancer Cousin        Dx 28; daughter of a paternal uncle  . Ovarian cancer Paternal Aunt        Dx 43s.  BRCA  positive  . Ovarian cancer Other        pat grandfather's mother    ALLERGIES:  has No Known Allergies.  MEDICATIONS:  Current Outpatient Prescriptions  Medication Sig Dispense Refill  . aspirin EC 81 MG tablet Take 81 mg by mouth daily.    . cetirizine (ZYRTEC) 10 MG tablet Take 10 mg by mouth at bedtime.     . Cholecalciferol (VITAMIN D) 2000 units tablet Take 2,000 Units by mouth daily.    . cloNIDine (CATAPRES) 0.2 MG tablet Take 1 tablet (0.2 mg total) by mouth at bedtime. 90 tablet 0  . escitalopram (LEXAPRO) 20 MG tablet TAKE 1 TABLET(20 MG) BY MOUTH DAILY 90 tablet 0  . Flaxseed, Linseed, (FLAXSEED OIL PO) Take 1,400 mg by mouth every evening.    . fluticasone (FLONASE ALLERGY RELIEF) 50 MCG/ACT nasal spray Place 1 spray into both nostrils daily.    Marland Kitchen lidocaine-prilocaine (EMLA) cream Apply 1 application topically as needed. 30 g 3  . losartan (COZAAR) 100 MG tablet Take 1 tablet (100 mg total) by mouth daily. 30 tablet 11  . magnesium oxide (MAG-OX) 400 MG tablet Take 400 mg by mouth every other day.    . montelukast (SINGULAIR) 10 MG tablet TAKE 1 TABLET(10 MG) BY MOUTH DAILY 90 tablet 0  . Multiple Vitamins-Minerals (MULTIVITAMIN WITH MINERALS) tablet Take 1 tablet by mouth every evening.     . Omega-3 Fatty Acids ([employer] FISH OIL) 1200 MG CAPS Take 1,200 mg by mouth every evening.     . prochlorperazine (COMPAZINE) 10 MG tablet Take 1 tablet (10 mg total) by mouth every 6 (six) hours as needed (Nausea or vomiting). 30 tablet 1  . venlafaxine XR (EFFEXOR-XR) 75 MG 24 hr capsule TAKE 2 CAPSULES BY MOUTH EVERY MORNING AND 1 CAPSULE EVERY EVENING 270 capsule 1  . vitamin B-12 (CYANOCOBALAMIN) 100 MCG tablet Take 100 mcg by mouth every other day.     Marland Kitchen acetaminophen (TYLENOL) 500 MG tablet Take 500-1,000 mg by mouth every 6 (six) hours as needed for mild pain or headache (depends on pain if takes 1-2 tablets).    Marland Kitchen  acyclovir ointment (ZOVIRAX) 5 % Apply 1 application topically every 3  (three) hours. (Patient not taking: Reported on 08/25/2016) 15 g 1  . ondansetron (ZOFRAN) 8 MG tablet Take 1 tablet (8 mg total) by mouth 2 (two) times daily as needed. Start on the third day after chemotherapy. (Patient not taking: Reported on 08/11/2016) 30 tablet 1  . valACYclovir (VALTREX) 500 MG tablet Take 1 tablet (500 mg total) by mouth 2 (two) times daily. Take for 3 days as needed. (Patient not taking: Reported on 08/11/2016) 30 tablet 1   No current facility-administered medications for this visit.    Facility-Administered Medications Ordered in Other Visits  Medication Dose Route Frequency Provider Last Rate Last Dose  . sodium chloride flush (NS) 0.9 % injection 10 mL  10 mL Intravenous PRN Truitt Merle, MD   10 mL at 06/30/16 0844    REVIEW OF SYSTEMS:   Constitutional: Denies fevers, chills or abnormal night sweats  (+) fatigue  Eyes: Denies blurriness of vision, double vision or watery eyes Ears, nose, mouth, throat, and face: Denies mucositis or sore throat Respiratory: Denies cough, dyspnea or wheezes Cardiovascular: Denies palpitation, chest discomfort or lower extremity swelling Gastrointestinal:  Denies heartburn  Skin:  (+)darkening of nails (+)skin rash of right arm Lymphatics: Denies new lymphadenopathy or easy bruising Neurological:Denies numbness, tingling or new weaknesses Behavioral/Psych: Mood is stable, no new changes  All other systems were reviewed with the patient and are negative.  PHYSICAL EXAMINATION: ECOG PERFORMANCE STATUS: 0 - Asymptomatic  Vitals:   08/25/16 0946  BP: (!) 131/91  Pulse: 77  Resp: 18  Temp: 98.2 F (36.8 C)  TempSrc: Oral  SpO2: 100%  Weight: 153 lb (69.4 kg)  Height: 5' 4"  (1.626 m)     GENERAL:alert, no distress and comfortable SKIN: skin color, texture, turgor are normal, no rashes or significant lesions, Dryness pof skin and color changes to nail nail bed. EYES: normal, conjunctiva are pink and non-injected, sclera  clear OROPHARYNX:no exudate, no erythema and lips, buccal mucosa, and tongue normal, no oral thrush NECK: supple, thyroid normal size, non-tender, without nodularity LYMPH:  no palpable lymphadenopathy in the cervical, axillary or inguinal LUNGS: clear to auscultation and percussion with normal breathing effort HEART: regular rate & rhythm and no murmurs and no lower extremity edema ABDOMEN:abdomen soft, non-tender and normal bowel sounds Musculoskeletal:no cyanosis of digits and no clubbing  PSYCH: alert & oriented x 3 with fluent speech NEURO: no focal motor/sensory deficits Breasts: Breast inspection showed Status post bilateral mastectomy, surgical incisions have healed well with no discharge or surrounding skin Erythema.   LABORATORY DATA:  I have reviewed the data as listed CBC Latest Ref Rng & Units 08/25/2016 08/18/2016 08/11/2016  WBC 3.9 - 10.3 10e3/uL 4.3 3.2(L) 4.6  Hemoglobin 11.6 - 15.9 g/dL 10.2(L) 10.7(L) 10.4(L)  Hematocrit 34.8 - 46.6 % 30.9(L) 31.9(L) 31.0(L)  Platelets 145 - 400 10e3/uL 257 217 240    CMP Latest Ref Rng & Units 08/25/2016 08/18/2016 08/11/2016  Glucose 70 - 140 mg/dl 102 85 91  BUN 7.0 - 26.0 mg/dL 10.9 14.4 15.1  Creatinine 0.6 - 1.1 mg/dL 0.6 0.7 0.7  Sodium 136 - 145 mEq/L 140 139 136  Potassium 3.5 - 5.1 mEq/L 4.1 4.1 4.2  Chloride 101 - 111 mmol/L - - -  CO2 22 - 29 mEq/L 23 26 27   Calcium 8.4 - 10.4 mg/dL 9.2 9.4 9.4  Total Protein 6.4 - 8.3 g/dL 6.3(L) 6.5 6.4  Total Bilirubin  0.20 - 1.20 mg/dL 0.27 0.44 0.44  Alkaline Phos 40 - 150 U/L 82 74 80  AST 5 - 34 U/L 18 18 18   ALT 0 - 55 U/L 16 17 16     PATHOLOGY  Diagnosis 04/12/16 1. Breast, simple mastectomy, Left Prophylactic Total - BREAST PARENCHYMA SHOWING FIBROCYSTIC CHANGES WITH APOCRINE METAPLASIA AND FLORID USUAL DUCTAL HYPERPLASIA. - FIBROADENOMA. - ONE BENIGN LYMPH NODE (0/1). - NO ATYPIA OR TUMOR SEEN. - GROSSLY UNREMARKABLE SKIN. 2. Breast, simple mastectomy, Right Total -  INVASIVE, GRADE 2 DUCTAL CARCINOMA, SPANNING 1.7 CM IN GREATEST DIMENSION. - MARGINS ARE NEGATIVE. - GROSSLY UNREMARKABLE SKIN. - SEE ONCOLOGY TEMPLATE. 3. Lymph node, sentinel, biopsy, Right Axillary #1 - ONE BENIGN WITH NO TUMOR SEEN (0/1). - SEE COMMENT. 4. Lymph node, sentinel, biopsy, Right - ONE BENIGN WITH NO TUMOR SEEN (0/1). - SEE COMMENT. 5. Lymph node, sentinel, biopsy, Right - ONE BENIGN WITH NO TUMOR SEEN (0/1). - SEE COMMENT.  Diagnosis 03/08/2016 1. Breast, right, needle core biopsy, 12:00 o'clock - INVASIVE MAMMARY CARCINOMA. - SEE COMMENT. 2. Lymph node, needle/core biopsy, right axilla - THERE IS NO EVIDENCE OF CARCINOMA IN 1 OF 1 LYMPH NODE (0/1).   PROCEDURES  ECHO 05/10/16 Study Conclusions - Left ventricle: The cavity size was normal. Systolic function was   normal. The estimated ejection fraction was in the range of 55%   to 60%. Wall motion was normal; there were no regional wall   motion abnormalities. Doppler parameters are consistent with   abnormal left ventricular relaxation (grade 1 diastolic   dysfunction). There was no evidence of elevated ventricular   filling pressure by Doppler parameters. - Aortic valve: Trileaflet; normal thickness leaflets. There was no   regurgitation. - Aortic root: The aortic root was normal in size. - Mitral valve: Structurally normal valve. There was no   regurgitation. - Right ventricle: The cavity size was normal. Wall thickness was   normal. Systolic function was normal. - Tricuspid valve: There was trivial regurgitation. - Pulmonary arteries: Systolic pressure was within the normal   range. - Inferior vena cava: The vessel was normal in size. - Pericardium, extracardiac: There was no pericardial effusion.   RADIOGRAPHIC STUDIES: I have personally reviewed the radiological images as listed and agreed with the findings in the report. No results found.   Bilateral mammogram and [place] 03/04/16 IMPRESSION: 1. 1.7  cm mass in the 12 o'clock position of the right breast with imaging features highly suspicious for malignancy. 2. Borderline abnormal right axillary lymph node, suspicious for a metastatic node.  Screening mammogram 02/29/16 IMPRESSION: Further evaluation is suggested for possible architectural distortion and asymmetry in the right breast.  ASSESSMENT & PLAN:  61 y.o. postmenopausal woman, presented with screening discovered right breast cancer  1. 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 Oncotype RS 49, high risk  - Patient has had bilateral total mastectomy on 04/12/16 and denied reconstruction.  -I previously reviewed her surgical pathology findings with patient and her husband in details. Her surgical margins were negative, 3 sentinel lymph nodes were also negative. - Her oncotype is back at 49. This is fairly high risk disease based on the RS. the estimated 10 year risk of distant recurrence with tamoxifen alone is 32%. I previously, strongly recommend chemotherapy to reduce her risk of cancer recurrence after surgery (by ~50%). -she previously started adjuvant chemotherapy with dose dense Adriamycin and  Cytoxan - Baseline Echo on 05/10/16 showed EF 55-60%. -she has been tolerating chemo very well overall, mild fatigue, and started having mild skin rashes on arms, possibly related to Taxol -Due to the skin rash, I'll switch her Taxol to Abraxane from next week --Lab reviewed, mild anemia, adequate for treatment, we'll proceed weekly Taxol today with increased dose of premedication dexamethasone to 72m  2. BRCA1 mutation (+) -Due to her strong family history of ovarian cancer and also positive family history of breast cancer, she underwent genetic testing - Patient is positive for BRCA1 Mutation -We previously discussed the high risk of breast cancer and colon cancer due to the BRCA1  mutation. She is status post bilateral mastectomy. She is agreeable to have BSO, she has discussed with her gynecologist. I have recommended her to have this surgery done after she completes adjuvant chemotherapy. - Her sister has done genetic testing, results pending -She has no children.  3. 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 Skin rashes on arms -Started in the past few weeks, with diffuse erythema on arms, not symptomatic, overall improved -This is possibly related to Taxol. I'll change her chemotherapy to approximately from next week. -I recommend her to take Benadryl and topical steroids if rashes gets worse after chemotherapy.  Plan:  -Lab reviewed, adequate for treatment, we'll proceed with weekly taxol, increased premed dexamethasone from 1109mto 16 mg - switch to Abraxane starting next week -labs and f/u in 2 and 4 weeks   All questions were answered. The patient knows to call the clinic with any problems, questions or concerns. I spent 20 minutes counseling the patient face to face. The total time spent in the appointment was 25 minutes and more than 50% was on counseling.  This document serves as a record of services personally performed by YaTruitt MerleMD. It was created on her behalf by TaBrandt Loosena trained medical scribe. The creation of this record is based on the scribe's personal observations and the provider's statements to them. This document has been checked and approved by the attending provider.

## 2016-08-25 ENCOUNTER — Ambulatory Visit (HOSPITAL_BASED_OUTPATIENT_CLINIC_OR_DEPARTMENT_OTHER): Payer: BLUE CROSS/BLUE SHIELD

## 2016-08-25 ENCOUNTER — Ambulatory Visit: Payer: BLUE CROSS/BLUE SHIELD

## 2016-08-25 ENCOUNTER — Telehealth: Payer: Self-pay | Admitting: Hematology

## 2016-08-25 ENCOUNTER — Ambulatory Visit (HOSPITAL_BASED_OUTPATIENT_CLINIC_OR_DEPARTMENT_OTHER): Payer: BLUE CROSS/BLUE SHIELD | Admitting: Hematology

## 2016-08-25 ENCOUNTER — Other Ambulatory Visit (HOSPITAL_BASED_OUTPATIENT_CLINIC_OR_DEPARTMENT_OTHER): Payer: BLUE CROSS/BLUE SHIELD

## 2016-08-25 VITALS — BP 131/91 | HR 77 | Temp 98.2°F | Resp 18 | Ht 64.0 in | Wt 153.0 lb

## 2016-08-25 DIAGNOSIS — C50111 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 female breast: Secondary | ICD-10-CM

## 2016-08-25 DIAGNOSIS — C50911 Malignant neoplasm of unspecified site of right female breast: Secondary | ICD-10-CM

## 2016-08-25 DIAGNOSIS — Z1502 Genetic susceptibility to malignant neoplasm of ovary: Secondary | ICD-10-CM | POA: Diagnosis not present

## 2016-08-25 DIAGNOSIS — Z1501 Genetic susceptibility to malignant neoplasm of breast: Secondary | ICD-10-CM | POA: Diagnosis not present

## 2016-08-25 DIAGNOSIS — R21 Rash and other nonspecific skin eruption: Secondary | ICD-10-CM

## 2016-08-25 DIAGNOSIS — Z170 Estrogen receptor positive status [ER+]: Principal | ICD-10-CM

## 2016-08-25 DIAGNOSIS — Z1509 Genetic susceptibility to other malignant neoplasm: Secondary | ICD-10-CM

## 2016-08-25 DIAGNOSIS — Z5111 Encounter for antineoplastic chemotherapy: Secondary | ICD-10-CM

## 2016-08-25 DIAGNOSIS — I10 Essential (primary) hypertension: Secondary | ICD-10-CM

## 2016-08-25 DIAGNOSIS — D6481 Anemia due to antineoplastic chemotherapy: Secondary | ICD-10-CM | POA: Diagnosis not present

## 2016-08-25 LAB — COMPREHENSIVE METABOLIC PANEL
ALT: 16 U/L (ref 0–55)
AST: 18 U/L (ref 5–34)
Albumin: 3.8 g/dL (ref 3.5–5.0)
Alkaline Phosphatase: 82 U/L (ref 40–150)
Anion Gap: 10 mEq/L (ref 3–11)
BUN: 10.9 mg/dL (ref 7.0–26.0)
CALCIUM: 9.2 mg/dL (ref 8.4–10.4)
CHLORIDE: 107 meq/L (ref 98–109)
CO2: 23 meq/L (ref 22–29)
Creatinine: 0.6 mg/dL (ref 0.6–1.1)
EGFR: >90 mL/min/{1.73_m2} (ref >90)
Glucose: 102 mg/dl (ref 70–140)
POTASSIUM: 4.1 meq/L (ref 3.5–5.1)
SODIUM: 140 meq/L (ref 136–145)
Total Bilirubin: 0.27 mg/dL (ref 0.20–1.20)
Total Protein: 6.3 g/dL — ABNORMAL LOW (ref 6.4–8.3)

## 2016-08-25 LAB — CBC WITH DIFFERENTIAL/PLATELET
BASO%: 0.7 % (ref 0.0–2.0)
BASOS ABS: 0 10*3/uL (ref 0.0–0.1)
EOS%: 3.5 % (ref 0.0–7.0)
Eosinophils Absolute: 0.2 10*3/uL (ref 0.0–0.5)
HEMATOCRIT: 30.9 % — AB (ref 34.8–46.6)
HGB: 10.2 g/dL — ABNORMAL LOW (ref 11.6–15.9)
LYMPH%: 20.5 % (ref 14.0–49.7)
MCH: 34.3 pg — AB (ref 25.1–34.0)
MCHC: 33 g/dL (ref 31.5–36.0)
MCV: 104 fL — ABNORMAL HIGH (ref 79.5–101.0)
MONO#: 0.5 10*3/uL (ref 0.1–0.9)
MONO%: 11.9 % (ref 0.0–14.0)
NEUT#: 2.7 10*3/uL (ref 1.5–6.5)
NEUT%: 63.4 % (ref 38.4–76.8)
Platelets: 257 10*3/uL (ref 145–400)
RBC: 2.97 10*6/uL — AB (ref 3.70–5.45)
RDW: 16 % — ABNORMAL HIGH (ref 11.2–14.5)
WBC: 4.3 10*3/uL (ref 3.9–10.3)
lymph#: 0.9 10*3/uL (ref 0.9–3.3)

## 2016-08-25 MED ORDER — FAMOTIDINE IN NACL 20-0.9 MG/50ML-% IV SOLN
20.0000 mg | Freq: Once | INTRAVENOUS | Status: AC
  Administered 2016-08-25: 20 mg via INTRAVENOUS

## 2016-08-25 MED ORDER — SODIUM CHLORIDE 0.9 % IV SOLN
Freq: Once | INTRAVENOUS | Status: AC
  Administered 2016-08-25: 12:00:00 via INTRAVENOUS
  Filled 2016-08-25: qty 50

## 2016-08-25 MED ORDER — SODIUM CHLORIDE 0.9 % IV SOLN
Freq: Once | INTRAVENOUS | Status: AC
  Administered 2016-08-25: 11:00:00 via INTRAVENOUS

## 2016-08-25 MED ORDER — HEPARIN SOD (PORK) LOCK FLUSH 100 UNIT/ML IV SOLN
500.0000 [IU] | Freq: Once | INTRAVENOUS | Status: AC | PRN
  Administered 2016-08-25: 500 [IU]
  Filled 2016-08-25: qty 5

## 2016-08-25 MED ORDER — PACLITAXEL CHEMO INJECTION 300 MG/50ML
80.0000 mg/m2 | Freq: Once | INTRAVENOUS | Status: AC
  Administered 2016-08-25: 144 mg via INTRAVENOUS
  Filled 2016-08-25: qty 24

## 2016-08-25 MED ORDER — DIPHENHYDRAMINE HCL 50 MG/ML IJ SOLN
25.0000 mg | Freq: Once | INTRAMUSCULAR | Status: AC
  Administered 2016-08-25: 25 mg via INTRAVENOUS

## 2016-08-25 MED ORDER — DIPHENHYDRAMINE HCL 50 MG/ML IJ SOLN
INTRAMUSCULAR | Status: AC
  Filled 2016-08-25: qty 1

## 2016-08-25 MED ORDER — SODIUM CHLORIDE 0.9% FLUSH
10.0000 mL | INTRAVENOUS | Status: DC | PRN
  Administered 2016-08-25: 10 mL
  Filled 2016-08-25: qty 10

## 2016-08-25 MED ORDER — SODIUM CHLORIDE 0.9 % IJ SOLN
10.0000 mL | Freq: Once | INTRAMUSCULAR | Status: AC
  Administered 2016-08-25: 10 mL via INTRAVENOUS
  Filled 2016-08-25: qty 10

## 2016-08-25 MED ORDER — FAMOTIDINE IN NACL 20-0.9 MG/50ML-% IV SOLN
INTRAVENOUS | Status: AC
Start: 2016-08-25 — End: 2016-08-25
  Filled 2016-08-25: qty 50

## 2016-08-25 MED ORDER — DEXAMETHASONE SODIUM PHOSPHATE 10 MG/ML IJ SOLN: 16.0000 mg | Freq: Once | INTRAMUSCULAR | Status: DC

## 2016-08-25 NOTE — Patient Instructions (Signed)
[facility] Discharge Instructions for Patients Receiving Chemotherapy  Today you received the following chemotherapy agents Taxol  To help prevent nausea and vomiting after your treatment, we encourage you to take your nausea medication   If you develop nausea and vomiting that is not controlled by your nausea medication, call the clinic.   BELOW ARE SYMPTOMS THAT SHOULD BE REPORTED IMMEDIATELY:  *FEVER GREATER THAN 100.5 F  *CHILLS WITH OR WITHOUT FEVER  NAUSEA AND VOMITING THAT IS NOT CONTROLLED WITH YOUR NAUSEA MEDICATION  *UNUSUAL SHORTNESS OF BREATH  *UNUSUAL BRUISING OR BLEEDING  TENDERNESS IN MOUTH AND THROAT WITH OR WITHOUT PRESENCE OF ULCERS  *URINARY PROBLEMS  *BOWEL PROBLEMS  UNUSUAL RASH Items with * indicate a potential emergency and should be followed up as soon as possible.  Feel free to call the clinic you have any questions or concerns. The clinic phone number is [phone].  Please show the CHEMO ALERT CARD at check-in to the Emergency Department and triage nurse.   

## 2016-08-25 NOTE — Patient Instructions (Signed)

## 2016-08-25 NOTE — Telephone Encounter (Signed)
Scheduled appt per 7/19 los - patient my chart active - did not want AVS and calender printed - aware of appt dates and time.

## 2016-08-28 ENCOUNTER — Encounter: Payer: Self-pay | Admitting: Hematology

## 2016-09-01 ENCOUNTER — Ambulatory Visit (HOSPITAL_BASED_OUTPATIENT_CLINIC_OR_DEPARTMENT_OTHER): Payer: BLUE CROSS/BLUE SHIELD

## 2016-09-01 ENCOUNTER — Encounter: Payer: Self-pay | Admitting: *Deleted

## 2016-09-01 ENCOUNTER — Other Ambulatory Visit (HOSPITAL_BASED_OUTPATIENT_CLINIC_OR_DEPARTMENT_OTHER): Payer: BLUE CROSS/BLUE SHIELD

## 2016-09-01 VITALS — BP 134/93 | HR 73 | Temp 97.7°F | Resp 17

## 2016-09-01 DIAGNOSIS — C50111 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 female breast: Secondary | ICD-10-CM

## 2016-09-01 DIAGNOSIS — Z170 Estrogen receptor positive status [ER+]: Principal | ICD-10-CM

## 2016-09-01 DIAGNOSIS — Z5111 Encounter for antineoplastic chemotherapy: Secondary | ICD-10-CM

## 2016-09-01 LAB — CBC WITH DIFFERENTIAL/PLATELET
BASO%: 0.5 % (ref 0.0–2.0)
BASOS ABS: 0 10*3/uL (ref 0.0–0.1)
EOS%: 2.4 % (ref 0.0–7.0)
Eosinophils Absolute: 0.1 10*3/uL (ref 0.0–0.5)
HCT: 31.2 % — ABNORMAL LOW (ref 34.8–46.6)
HGB: 10.4 g/dL — ABNORMAL LOW (ref 11.6–15.9)
LYMPH%: 22.7 % (ref 14.0–49.7)
MCH: 34.9 pg — AB (ref 25.1–34.0)
MCHC: 33.3 g/dL (ref 31.5–36.0)
MCV: 104.7 fL — AB (ref 79.5–101.0)
MONO#: 0.4 10*3/uL (ref 0.1–0.9)
MONO%: 9.7 % (ref 0.0–14.0)
NEUT#: 2.7 10*3/uL (ref 1.5–6.5)
NEUT%: 64.7 % (ref 38.4–76.8)
Platelets: 237 10*3/uL (ref 145–400)
RBC: 2.98 10*6/uL — ABNORMAL LOW (ref 3.70–5.45)
RDW: 14.9 % — ABNORMAL HIGH (ref 11.2–14.5)
WBC: 4.2 10*3/uL (ref 3.9–10.3)
lymph#: 1 10*3/uL (ref 0.9–3.3)

## 2016-09-01 LAB — COMPREHENSIVE METABOLIC PANEL
ALT: 20 U/L (ref 0–55)
AST: 22 U/L (ref 5–34)
Albumin: 3.8 g/dL (ref 3.5–5.0)
Alkaline Phosphatase: 81 U/L (ref 40–150)
Anion Gap: 8 mEq/L (ref 3–11)
BUN: 11.9 mg/dL (ref 7.0–26.0)
CHLORIDE: 105 meq/L (ref 98–109)
CO2: 26 meq/L (ref 22–29)
Calcium: 9.3 mg/dL (ref 8.4–10.4)
Creatinine: 0.7 mg/dL (ref 0.6–1.1)
EGFR: >90 mL/min/{1.73_m2} (ref >90)
GLUCOSE: 94 mg/dL (ref 70–140)
POTASSIUM: 4.2 meq/L (ref 3.5–5.1)
SODIUM: 139 meq/L (ref 136–145)
Total Bilirubin: 0.36 mg/dL (ref 0.20–1.20)
Total Protein: 6.4 g/dL (ref 6.4–8.3)

## 2016-09-01 MED ORDER — SODIUM CHLORIDE 0.9% FLUSH
10.0000 mL | INTRAVENOUS | Status: DC | PRN
  Administered 2016-09-01: 10 mL
  Filled 2016-09-01: qty 10

## 2016-09-01 MED ORDER — ONDANSETRON HCL 8 MG PO TABS
8.0000 mg | ORAL_TABLET | Freq: Once | ORAL | Status: AC
  Administered 2016-09-01: 8 mg via ORAL

## 2016-09-01 MED ORDER — HEPARIN SOD (PORK) LOCK FLUSH 100 UNIT/ML IV SOLN
500.0000 [IU] | Freq: Once | INTRAVENOUS | Status: AC | PRN
  Administered 2016-09-01: 500 [IU]
  Filled 2016-09-01: qty 5

## 2016-09-01 MED ORDER — ONDANSETRON HCL 8 MG PO TABS
ORAL_TABLET | ORAL | Status: AC
  Filled 2016-09-01: qty 1

## 2016-09-01 MED ORDER — SODIUM CHLORIDE 0.9 % IV SOLN
Freq: Once | INTRAVENOUS | Status: AC
  Administered 2016-09-01: 10:00:00 via INTRAVENOUS

## 2016-09-01 MED ORDER — PACLITAXEL PROTEIN-BOUND CHEMO INJECTION 100 MG
100.0000 mg/m2 | Freq: Once | INTRAVENOUS | Status: AC
  Administered 2016-09-01: 175 mg via INTRAVENOUS
  Filled 2016-09-01: qty 35

## 2016-09-01 NOTE — Patient Instructions (Signed)
[facility] Discharge Instructions for Patients Receiving Chemotherapy  Today you received the following chemotherapy agents ABRAXANE  To help prevent nausea and vomiting after your treatment, we encourage you to take your nausea medication    If you develop nausea and vomiting that is not controlled by your nausea medication, call the clinic.   BELOW ARE SYMPTOMS THAT SHOULD BE REPORTED IMMEDIATELY:  *FEVER GREATER THAN 100.5 F  *CHILLS WITH OR WITHOUT FEVER  NAUSEA AND VOMITING THAT IS NOT CONTROLLED WITH YOUR NAUSEA MEDICATION  *UNUSUAL SHORTNESS OF BREATH  *UNUSUAL BRUISING OR BLEEDING  TENDERNESS IN MOUTH AND THROAT WITH OR WITHOUT PRESENCE OF ULCERS  *URINARY PROBLEMS  *BOWEL PROBLEMS  UNUSUAL RASH Items with * indicate a potential emergency and should be followed up as soon as possible.  Feel free to call the clinic you have any questions or concerns. The clinic phone number is [phone].  Please show the [facility] at check-in to the Emergency Department and triage nurse.   Nanoparticle Albumin-Bound Paclitaxel injection What is this medicine? NANOPARTICLE ALBUMIN-BOUND PACLITAXEL (Na no PAHR ti kuhl al BYOO muhn-bound PAK li TAX el) is a chemotherapy drug. It targets fast dividing cells, like cancer cells, and causes these cells to die. This medicine is used to treat advanced breast cancer and advanced lung cancer. This medicine may be used for other purposes; ask your health care provider or pharmacist if you have questions. COMMON BRAND NAME(S): Abraxane What should I tell my health care provider before I take this medicine? They need to know if you have any of these conditions: -kidney disease -liver disease -low blood counts, like low platelets, red blood cells, or white blood cells -recent or ongoing radiation therapy -an unusual or allergic reaction to paclitaxel, albumin, other chemotherapy, other medicines, foods, dyes, or  preservatives -pregnant or trying to get pregnant -breast-feeding How should I use this medicine? This drug is given as an infusion into a vein. It is administered in a hospital or clinic by a specially trained health care professional. Talk to your pediatrician regarding the use of this medicine in children. Special care may be needed. Overdosage: If you think you have taken too much of this medicine contact a poison control center or emergency room at once. NOTE: This medicine is only for you. Do not share this medicine with others. What if I miss a dose? It is important not to miss your dose. Call your doctor or health care professional if you are unable to keep an appointment. What may interact with this medicine? -cyclosporine -diazepam -ketoconazole -medicines to increase blood counts like filgrastim, pegfilgrastim, sargramostim -other chemotherapy drugs like cisplatin, doxorubicin, epirubicin, etoposide, teniposide, vincristine -quinidine -testosterone -vaccines -verapamil Talk to your doctor or health care professional before taking any of these medicines: -acetaminophen -aspirin -ibuprofen -ketoprofen -naproxen This list may not describe all possible interactions. Give your health care provider a list of all the medicines, herbs, non-prescription drugs, or dietary supplements you use. Also tell them if you smoke, drink alcohol, or use illegal drugs. Some items may interact with your medicine. What should I watch for while using this medicine? Your condition will be monitored carefully while you are receiving this medicine. You will need important blood work done while you are taking this medicine. This medicine can cause serious allergic reactions. If you experience allergic reactions like skin rash, itching or hives, swelling of the face, lips, or tongue, tell your doctor or health care professional right  away. In some cases, you may be given additional medicines to help with  side effects. Follow all directions for their use. This drug may make you feel generally unwell. This is not uncommon, as chemotherapy can affect healthy cells as well as cancer cells. Report any side effects. Continue your course of treatment even though you feel ill unless your doctor tells you to stop. Call your doctor or health care professional for advice if you get a fever, chills or sore throat, or other symptoms of a cold or flu. Do not treat yourself. This drug decreases your body's ability to fight infections. Try to avoid being around people who are sick. This medicine may increase your risk to bruise or bleed. Call your doctor or health care professional if you notice any unusual bleeding. Be careful brushing and flossing your teeth or using a toothpick because you may get an infection or bleed more easily. If you have any dental work done, tell your dentist you are receiving this medicine. Avoid taking products that contain aspirin, acetaminophen, ibuprofen, naproxen, or ketoprofen unless instructed by your doctor. These medicines may hide a fever. Do not become pregnant while taking this medicine. Women should inform their doctor if they wish to become pregnant or think they might be pregnant. There is a potential for serious side effects to an unborn child. Talk to your health care professional or pharmacist for more information. Do not breast-feed an infant while taking this medicine. Men are advised not to father a child while receiving this medicine. What side effects may I notice from receiving this medicine? Side effects that you should report to your doctor or health care professional as soon as possible: -allergic reactions like skin rash, itching or hives, swelling of the face, lips, or tongue -low blood counts - This drug may decrease the number of white blood cells, red blood cells and platelets. You may be at increased risk for infections and bleeding. -signs of infection -  fever or chills, cough, sore throat, pain or difficulty passing urine -signs of decreased platelets or bleeding - bruising, pinpoint red spots on the skin, black, tarry stools, nosebleeds -signs of decreased red blood cells - unusually weak or tired, fainting spells, lightheadedness -breathing problems -changes in vision -chest pain -high or low blood pressure -mouth sores -nausea and vomiting -pain, swelling, redness or irritation at the injection site -pain, tingling, numbness in the hands or feet -slow or irregular heartbeat -swelling of the ankle, feet, hands Side effects that usually do not require medical attention (report to your doctor or health care professional if they continue or are bothersome): -aches, pains -changes in the color of fingernails -diarrhea -hair loss -loss of appetite This list may not describe all possible side effects. Call your doctor for medical advice about side effects. You may report side effects to FDA at 1-800-FDA-1088. Where should I keep my medicine? This drug is given in a hospital or clinic and will not be stored at home. NOTE: This sheet is a summary. It may not cover all possible information. If you have questions about this medicine, talk to your doctor, pharmacist, or health care provider.  2018 [employer] (2014-11-26 10:05:20)

## 2016-09-02 ENCOUNTER — Telehealth: Payer: Self-pay | Admitting: *Deleted

## 2016-09-02 NOTE — Telephone Encounter (Signed)
Called pt & left message on vm informing reason for call was to check & see how she did with her new treatment.  Requested that she call us back on Monday with update.

## 2016-09-02 NOTE — Telephone Encounter (Signed)
-----   Message from Shelda Altes, RN sent at 09/01/2016 12:07 PM EDT ----- Please call patient to see how she tolerated her first infusion of abraxane. Pt is followed by Dr. Burr Medico. Pt tolerated infusion well in clinic. Thank you!

## 2016-09-05 NOTE — Progress Notes (Signed)
[place]  [phone] [phone]  Clinic Follow Up Note   Patient Care Team: [name], MD as PCP - General (Internal Medicine) Verl Blalock, Marijo Conception, MD (Inactive) as Consulting Physician (Cardiology) [name], MD as Consulting Physician (Dermatology) 09/08/2016    CHIEF COMPLAINTS:  Follow up right breast cancer  Oncology History   Cancer Staging 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 breast in female, estrogen receptor positive ([place]) Staging form: Breast, AJCC 8th Edition - Clinical stage from 03/08/2016: Stage IA (cT1c, cN0, cM0, G2, ER: Positive, PR: Negative, HER2: Negative) - Signed by Truitt Merle, MD on 03/15/2016 - Pathologic stage from 04/12/2016: Stage IA (pT1c, pN0, cM0, G2, ER: Positive, PR: Negative, HER2: Negative, Oncotype DX score: 49) - Signed by Truitt Merle, MD on 05/05/2016       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 breast in female, estrogen receptor positive ([place])   03/04/2016 Mammogram    Diagnostic bilateral mammogram and right breast ultrasound showed a 1.7 cm mass in the 12:00 position of the right breast, highly suspicious for malignancy. There is a borderline abnormal right axillary lymph node, also suspicious for metastasis.       03/08/2016 Initial Diagnosis    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 breast in female, estrogen receptor positive ([place])      03/08/2016 Initial Biopsy    Right breast 12:00 core needle biopsy showed invasive ductal carcinoma, grade 2, right axillary lymph node biopsy was negative.      03/08/2016 Receptors her2    ER 20% positive, PR negative, HER-2 negative, Ki-67 20%      03/30/2016 Imaging    MR Bilateral Breast 03/30/2016 IMPRESSION: Known unifocal right breast malignancy. No MRI evidence of malignancy in the left breast.      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  Genes Analyzed: 43 genes on  [facility]'s Common Cancers panel (APC, ATM, AXIN2, BARD1, BMPR1A, BRCA1, BRCA2, BRIP1, CDH1, CDKN2A, CHEK2, DICER1, EPCAM, GREM1, HOXB13, KIT, MEN1, MLH1, MSH2, MSH6, MUTYH, NBN, NF1, PALB2, PDGFRA, PMS2, POLD1, POLE, PTEN, RAD50, RAD51C, RAD51D, SDHA, SDHB, SDHC, SDHD, SMAD4, SMARCA4, STK11, TP53, TSC1, TSC2, VHL).       04/12/2016 Surgery    Bilateral mastectomy and right sentinel lymph node biopsy, by [name]      04/12/2016 Pathology Results    Right breast mastectomy showed invasive grade 2 invasive ductal carcinoma, 1.7 cm, margins were negative, 3 sentinel lymph nodes were negative, left simple mastectomy fibroadenoma, one benign node, no atypia or tumor.       05/04/2016 Oncotype testing    Recurrence score 49, high-risk, predicts 10 year risk of distant recurrence 32% with tamoxifen alone.      05/10/2016 Echocardiogram    Echo on 05/10/16 showed EF 55-60%.      05/20/2016 -  Chemotherapy    dose dense Adriamycin and Cytoxan, every 2 weeks starting 05/20/16 for 4 cycles, followed by Taxol starting 07/14/16 (weekly for 12 weeks or biweekly for 4 cycles) ending 09/30/16.       Breast cancer, right ([place])   04/12/2016 Initial Diagnosis    Breast cancer, right ([place])      HISTORY OF PRESENTING ILLNESS (03/16/16):  Chelsea Salinas 61 y.o. female is here because of recently diagnosed right breast invasive mammary carcinoma. She is accompanied by her husband to our multidisciplinary breast clinic today.  The patient underwent routine screening mammogram on 02/29/2016 and  was found to have possible architectural distortion and asymmetry in the right breast. Also found was asymmetry in the left breast.  Accordingly a bilateral diagnostic mammogram and unilateral right breast ultrasound were performed on 03/04/2016. These scans showed a 1.7 cm mass in the 12 o'clock position of the right breast with imaging features highly suspicious for malignancy. Also seen was borderline abnormal right axillary  lymph node, suspicious for a metastatic node.  Biopsy of the right breast 12:00 o'clock position on 03/08/2016 revealed invasive mammary carcinoma. The carcinoma appears to be at least grade 2, ER positive, PR negative, HER-2 negative, Ki67 20%. The malignant cells are positive for E-Cadherin, supporting a ductal phenotype. Biopsy of the right axillary lymph node showed no evidence of carcinoma.   The patient reports to the clinic today for follow up and to discuss chemotherapy. She has had surgery about 3 weeks ago and she has been doing well. She started antibiotics yesterday to help with infection that she has recently gotten. She has also started doing PT this week. She decided not to do reconstruction. She has been eating well.    GYN HISTORY  Menarchal:12 LMP: late 78's (hot flushes since mid 40's)  Contraceptive: 23 HRT: 3-4 years  G0P0:  CURRENT THERAPY: dose dense Adriamycin and Cytoxan, every 2 weeks starting 05/20/16 for 4 cycles, followed by weekly Taxol for 12 weeks starting 07/14/16   INTERVAL HISTORY:   Of note, pt notes that had labs drawn on 08/25/2016 and 09/01/2016 with her most recent labs been abnormal for decreased HBG (10.4), decreased HCT (31.4), elevated MCV (104.7), elevated MCH (34.9), decreased RBC (2.98), and elevated RDW (14.9).   Chelsea Salinas returns for follow up and weekly Taxol. She is receiving cycle 9. She presents to the clinic today with her family member. Pt notes that she is doing well overall. She notes that she has a rash to her hands, but she doesn't contribute it to her chemotherapy. She reports that she doesn't want any reconstruction surgeries at this time. She notes that she would like to have her ovaries removed this year and she has discussed this with her gynecologist. She noted that her blood pressure was elevated today and she doesn't check this at home.    On review of systems, pt denies fever, chills, fatigue, weight loss. Notes a rash that she  doesn't contribute to chemo. Denies numbness. Denies pain. Denies abdominal pain, nausea, vomiting.     MEDICAL HISTORY:  Past Medical History:  Diagnosis Date  . Abnormal Pap smear of cervix    --in her 20s had colpo and some type of treatment to cervix.  . Allergy   . Asthma    "all the time, throughout the yr"  . 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 Breast cancer, right breast ([place]) 02/2016  . Eczema   . Family history of breast cancer   . Family history of genetic disease carrier    paternal relatives with a BRCA mutation  . Family history of ovarian cancer   . Hypertension   . Pneumonia    2000ish  . STD (sexually transmitted disease)    HSV II    SURGICAL HISTORY: Past Surgical History:  Procedure Laterality Date  . BREAST BIOPSY Right 04/13/2016   Procedure: Evacuation RIGHT Breast  Hematoma;  Surgeon: [name], MD;  Location: [facility];  Service: General;  Laterality: Right;  . COLONOSCOPY    . DILATION AND  CURETTAGE OF UTERUS  2008  . MASTECTOMY Left 04/12/2016   prophylactic total mastectomy  . MASTECTOMY COMPLETE / SIMPLE W/ SENTINEL NODE BIOPSY Right 04/12/2016   axillary sentinel LNB  . MASTECTOMY W/ SENTINEL NODE BIOPSY Bilateral 04/12/2016   Procedure: BILATERAL TOTAL MASTECTOMIES  WITH RIGHT SENTINEL LYMPH NODE BIOPSY;  Surgeon: [name], MD;  Location: [facility];  Service: General;  Laterality: Bilateral;  . PORTACATH PLACEMENT Right 05/19/2016   Procedure: INSERTION PORT-A-CATH WITH [place];  Surgeon: [name], MD;  Location: [facility];  Service: General;  Laterality: Right;    SOCIAL HISTORY: Social History   Social History  . Marital status: Married    Spouse name: N/A  . Number of children: N/A  . Years of education: N/A   Occupational History  . Not on file.   Social History Main Topics  . Smoking status: Former Smoker    Packs/day: 1.00    Years: 25.00    Quit date: 02/08/1996  . Smokeless tobacco:  Never Used  . Alcohol use 13.2 oz/week    15 Standard drinks or equivalent, 7 Glasses of wine per week  . Drug use: No  . Sexual activity: No   Other Topics Concern  . Not on file   Social History Narrative  . No narrative on file    FAMILY HISTORY: Family History  Problem Relation Age of Onset  . Polymyalgia rheumatica Mother   . Hypertension Father   . Heart disease Father        Dec 69  . Heart attack Father   . Prostate cancer Father        Dx 66s; deceased 32  . Breast cancer Cousin        Dx 37s; daughter of a paternal uncle  . Ovarian cancer Paternal Aunt        Dx 44; deceased  . Ovarian cancer Cousin        Dx 52s; daughter of a paternal uncle  . Ovarian cancer Cousin        Dx 7; daughter of a paternal uncle  . Ovarian cancer Paternal Aunt        Dx 62s.  BRCA positive  . Ovarian cancer Other        pat grandfather's mother    ALLERGIES:  has No Known Allergies.  MEDICATIONS:  Current Outpatient Prescriptions  Medication Sig Dispense Refill  . aspirin EC 81 MG tablet Take 81 mg by mouth daily.    . cetirizine (ZYRTEC) 10 MG tablet Take 10 mg by mouth at bedtime.     . Cholecalciferol (VITAMIN D) 2000 units tablet Take 2,000 Units by mouth daily.    . cloNIDine (CATAPRES) 0.2 MG tablet Take 1 tablet (0.2 mg total) by mouth at bedtime. 90 tablet 0  . escitalopram (LEXAPRO) 20 MG tablet TAKE 1 TABLET(20 MG) BY MOUTH DAILY 90 tablet 0  . Flaxseed, Linseed, (FLAXSEED OIL PO) Take 1,400 mg by mouth every evening.    . fluticasone (FLONASE ALLERGY RELIEF) 50 MCG/ACT nasal spray Place 1 spray into both nostrils daily.    Marland Kitchen lidocaine-prilocaine (EMLA) cream Apply 1 application topically as needed. 30 g 3  . losartan (COZAAR) 100 MG tablet Take 1 tablet (100 mg total) by mouth daily. 30 tablet 11  . magnesium oxide (MAG-OX) 400 MG tablet Take 400 mg by mouth every other day.    . Multiple Vitamins-Minerals (MULTIVITAMIN WITH MINERALS) tablet Take 1 tablet by mouth  every evening.     Marland Kitchen  Omega-3 Fatty Acids ([employer] FISH OIL) 1200 MG CAPS Take 1,200 mg by mouth every evening.     . venlafaxine XR (EFFEXOR-XR) 75 MG 24 hr capsule TAKE 2 CAPSULES BY MOUTH EVERY MORNING AND 1 CAPSULE EVERY EVENING 270 capsule 1  . vitamin B-12 (CYANOCOBALAMIN) 100 MCG tablet Take 100 mcg by mouth every other day.     Marland Kitchen acetaminophen (TYLENOL) 500 MG tablet Take 500-1,000 mg by mouth every 6 (six) hours as needed for mild pain or headache (depends on pain if takes 1-2 tablets).    Marland Kitchen acyclovir ointment (ZOVIRAX) 5 % Apply 1 application topically every 3 (three) hours. (Patient not taking: Reported on 08/25/2016) 15 g 1  . montelukast (SINGULAIR) 10 MG tablet TAKE 1 TABLET(10 MG) BY MOUTH DAILY (Patient not taking: Reported on 09/08/2016) 90 tablet 0  . ondansetron (ZOFRAN) 8 MG tablet Take 1 tablet (8 mg total) by mouth 2 (two) times daily as needed. Start on the third day after chemotherapy. (Patient not taking: Reported on 08/11/2016) 30 tablet 1  . prochlorperazine (COMPAZINE) 10 MG tablet Take 1 tablet (10 mg total) by mouth every 6 (six) hours as needed (Nausea or vomiting). (Patient not taking: Reported on 09/08/2016) 30 tablet 1  . valACYclovir (VALTREX) 500 MG tablet Take 1 tablet (500 mg total) by mouth 2 (two) times daily. Take for 3 days as needed. (Patient not taking: Reported on 08/11/2016) 30 tablet 1   No current facility-administered medications for this visit.    Facility-Administered Medications Ordered in Other Visits  Medication Dose Route Frequency Provider Last Rate Last Dose  . sodium chloride flush (NS) 0.9 % injection 10 mL  10 mL Intravenous PRN Truitt Merle, MD   10 mL at 06/30/16 0844  . sodium chloride flush (NS) 0.9 % injection 10 mL  10 mL Intravenous PRN Truitt Merle, MD   10 mL at 09/08/16 1329  . sodium chloride flush (NS) 0.9 % injection 10 mL  10 mL Intracatheter PRN Truitt Merle, MD        REVIEW OF SYSTEMS:  Constitutional: Denies fevers, chills or abnormal night  sweats  (+) fatigue  Eyes: Denies blurriness of vision, double vision or watery eyes Ears, nose, mouth, throat, and face: Denies mucositis or sore throat Respiratory: Denies cough, dyspnea or wheezes Cardiovascular: Denies palpitation, chest discomfort or lower extremity swelling Gastrointestinal:  Denies heartburn  Skin:  (+)darkening of nails (+)skin rash of right arm Lymphatics: Denies new lymphadenopathy or easy bruising Neurological:Denies numbness, tingling or new weaknesses Behavioral/Psych: Mood is stable, no new changes  All other systems were reviewed with the patient and are negative.  PHYSICAL EXAMINATION: ECOG PERFORMANCE STATUS: 1  Vitals:   09/08/16 1002  BP: (!) 138/97  Pulse: 73  Resp: 17  Temp: 98.4 F (36.9 C)  TempSrc: Oral  SpO2: 100%  Weight: 158 lb 6.4 oz (71.8 kg)  Height: 5' 4"  (1.626 m)     GENERAL:alert, no distress and comfortable SKIN: skin color, texture, turgor are normal, no rashes or significant lesions, Dryness pof skin and color changes to nail nail bed. EYES: normal, conjunctiva are pink and non-injected, sclera clear OROPHARYNX:no exudate, no erythema and lips, buccal mucosa, and tongue normal, no oral thrush NECK: supple, thyroid normal size, non-tender, without nodularity LYMPH:  no palpable lymphadenopathy in the cervical, axillary or inguinal LUNGS: clear to auscultation and percussion with normal breathing effort HEART: regular rate & rhythm and no murmurs and no lower extremity edema ABDOMEN:abdomen soft, non-tender  and normal bowel sounds Musculoskeletal:no cyanosis of digits and no clubbing  PSYCH: alert & oriented x 3 with fluent speech NEURO: no focal motor/sensory deficits Breasts: Breast inspection showed Status post bilateral mastectomy, surgical incisions have healed well with no discharge or surrounding skin Erythema.   LABORATORY DATA:  I have reviewed the data as listed CBC Latest Ref Rng & Units 09/08/2016 09/01/2016  08/25/2016  WBC 3.9 - 10.3 10e3/uL 3.7(L) 4.2 4.3  Hemoglobin 11.6 - 15.9 g/dL 10.0(L) 10.4(L) 10.2(L)  Hematocrit 34.8 - 46.6 % 29.8(L) 31.2(L) 30.9(L)  Platelets 145 - 400 10e3/uL 248 237 257    CMP Latest Ref Rng & Units 09/08/2016 09/01/2016 08/25/2016  Glucose 70 - 140 mg/dl 87 94 102  BUN 7.0 - 26.0 mg/dL 11.8 11.9 10.9  Creatinine 0.6 - 1.1 mg/dL 0.6 0.7 0.6  Sodium 136 - 145 mEq/L 140 139 140  Potassium 3.5 - 5.1 mEq/L 3.9 4.2 4.1  Chloride 101 - 111 mmol/L - - -  CO2 22 - 29 mEq/L 26 26 23   Calcium 8.4 - 10.4 mg/dL 9.0 9.3 9.2  Total Protein 6.4 - 8.3 g/dL 6.1(L) 6.4 6.3(L)  Total Bilirubin 0.20 - 1.20 mg/dL 0.22 0.36 0.27  Alkaline Phos 40 - 150 U/L 81 81 82  AST 5 - 34 U/L 23 22 18   ALT 0 - 55 U/L 16 20 16     PATHOLOGY  Diagnosis 04/12/16 1. Breast, simple mastectomy, Left Prophylactic Total - BREAST PARENCHYMA SHOWING FIBROCYSTIC CHANGES WITH APOCRINE METAPLASIA AND FLORID USUAL DUCTAL HYPERPLASIA. - FIBROADENOMA. - ONE BENIGN LYMPH NODE (0/1). - NO ATYPIA OR TUMOR SEEN. - GROSSLY UNREMARKABLE SKIN. 2. Breast, simple mastectomy, Right Total - INVASIVE, GRADE 2 DUCTAL CARCINOMA, SPANNING 1.7 CM IN GREATEST DIMENSION. - MARGINS ARE NEGATIVE. - GROSSLY UNREMARKABLE SKIN. - SEE ONCOLOGY TEMPLATE. 3. Lymph node, sentinel, biopsy, Right Axillary #1 - ONE BENIGN WITH NO TUMOR SEEN (0/1). - SEE COMMENT. 4. Lymph node, sentinel, biopsy, Right - ONE BENIGN WITH NO TUMOR SEEN (0/1). - SEE COMMENT. 5. Lymph node, sentinel, biopsy, Right - ONE BENIGN WITH NO TUMOR SEEN (0/1). - SEE COMMENT.  Diagnosis 03/08/2016 1. Breast, right, needle core biopsy, 12:00 o'clock - INVASIVE MAMMARY CARCINOMA. - SEE COMMENT. 2. Lymph node, needle/core biopsy, right axilla - THERE IS NO EVIDENCE OF CARCINOMA IN 1 OF 1 LYMPH NODE (0/1).   PROCEDURES  ECHO 05/10/16 Study Conclusions - Left ventricle: The cavity size was normal. Systolic function was   normal. The estimated ejection  fraction was in the range of 55%   to 60%. Wall motion was normal; there were no regional wall   motion abnormalities. Doppler parameters are consistent with   abnormal left ventricular relaxation (grade 1 diastolic   dysfunction). There was no evidence of elevated ventricular   filling pressure by Doppler parameters. - Aortic valve: Trileaflet; normal thickness leaflets. There was no   regurgitation. - Aortic root: The aortic root was normal in size. - Mitral valve: Structurally normal valve. There was no   regurgitation. - Right ventricle: The cavity size was normal. Wall thickness was   normal. Systolic function was normal. - Tricuspid valve: There was trivial regurgitation. - Pulmonary arteries: Systolic pressure was within the normal   range. - Inferior vena cava: The vessel was normal in size. - Pericardium, extracardiac: There was no pericardial effusion.   RADIOGRAPHIC STUDIES: I have personally reviewed the radiological images as listed and agreed with the findings in the report. No results found.  MR Bilateral Breast 03/30/2016 IMPRESSION: Known unifocal right breast malignancy. No MRI evidence of malignancy in the left breast.  Bilateral mammogram and [place] 03/04/16 IMPRESSION: 1. 1.7 cm mass in the 12 o'clock position of the right breast with imaging features highly suspicious for malignancy. 2. Borderline abnormal right axillary lymph node, suspicious for a metastatic node.  Screening mammogram 02/29/16 IMPRESSION: Further evaluation is suggested for possible architectural distortion and asymmetry in the right breast.  ASSESSMENT & PLAN:  61 y.o. postmenopausal woman, presented with screening discovered right breast cancer  1. 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 Oncotype RS 49, high risk  - Patient has had bilateral total mastectomy on 04/12/16 and denied  reconstruction.  -I previously reviewed her surgical pathology findings with patient and her husband in details. Her surgical margins were negative, 3 sentinel lymph nodes were also negative. - Her oncotype is back at 49. This is fairly high risk disease based on the RS. the estimated 10 year risk of distant recurrence with tamoxifen alone is 32%. I previously, strongly recommend chemotherapy to reduce her risk of cancer recurrence after surgery (by ~50%). -she previously started adjuvant chemotherapy with dose dense Adriamycin and Cytoxan - Baseline Echo on 05/10/16 showed EF 55-60%. -she has been tolerating chemo very well overall, mild fatigue, and started having mild skin rashes on arms, possibly related to Taxol -Due to the skin rash, I have switched her Taxol to Abraxane, she is tolerating well, except more noticeable fatigue. She still functioning very well. If her fatigue gets worse, I'll decrease her dose of Abraxane. --Lab reviewed, mild anemia, adequate for treatment, we'll proceed weekly Abraxane today  -No need for adjuvant therapy due to bilateral mastectomy. Will start on anti-estrogen therapy 1 month after chemotherapy -plan to have a bilateral BSO 2 months after chemotherapy completion.   2. BRCA1 mutation (+) -Due to her strong family history of ovarian cancer and also positive family history of breast cancer, she underwent genetic testing - Patient is positive for BRCA1 Mutation -We previously discussed the high risk of breast cancer and colon cancer due to the BRCA1 mutation. She is status post bilateral mastectomy. She is agreeable to have BSO, she has discussed with her gynecologist. I have recommended her to have this surgery done after she completes adjuvant chemotherapy. - Her sister has done genetic testing, results pending -She has no children.  3. 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 Skin rashes on arms -Started in the past few weeks, with  diffuse erythema on arms, not symptomatic, overall improved -This is possibly related to Taxol. I have changed her chemotherapy to Abraxane -Skin rash has resolved.  Plan:  -Lab reviewed, adequate for treatment, we'll proceed with weekly Abraxane at 18m/m2, if her fatigue gets worse, we'll decrease her dose to 80 mg/m, she has three more weeks treatments  -labs and f/u in 2 weeks   All questions were answered. The patient knows to call the clinic with any problems, questions or concerns. I spent 20 minutes counseling the patient face to face. The total time spent in the appointment was 25  minutes and more than 50% was on counseling.  This document serves as a record of services personally performed by YTruitt Merle MD. It was created on her behalf by SSteva Colder a trained medical scribe. The creation of this record is based on the scribe's  personal observations and the provider's statements to them. This document has been checked and approved by the attending provider.   Truitt Merle  09/08/16

## 2016-09-08 ENCOUNTER — Encounter: Payer: Self-pay | Admitting: Hematology

## 2016-09-08 ENCOUNTER — Ambulatory Visit (HOSPITAL_BASED_OUTPATIENT_CLINIC_OR_DEPARTMENT_OTHER): Payer: BLUE CROSS/BLUE SHIELD | Admitting: Hematology

## 2016-09-08 ENCOUNTER — Ambulatory Visit (HOSPITAL_BASED_OUTPATIENT_CLINIC_OR_DEPARTMENT_OTHER): Payer: BLUE CROSS/BLUE SHIELD

## 2016-09-08 ENCOUNTER — Other Ambulatory Visit (HOSPITAL_BASED_OUTPATIENT_CLINIC_OR_DEPARTMENT_OTHER): Payer: BLUE CROSS/BLUE SHIELD

## 2016-09-08 ENCOUNTER — Ambulatory Visit: Payer: BLUE CROSS/BLUE SHIELD

## 2016-09-08 VITALS — BP 138/97 | HR 73 | Temp 98.4°F | Resp 17 | Ht 64.0 in | Wt 158.4 lb

## 2016-09-08 DIAGNOSIS — Z170 Estrogen receptor positive status [ER+]: Secondary | ICD-10-CM | POA: Diagnosis not present

## 2016-09-08 DIAGNOSIS — C50111 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 female breast: Secondary | ICD-10-CM | POA: Diagnosis not present

## 2016-09-08 DIAGNOSIS — D6481 Anemia due to antineoplastic chemotherapy: Secondary | ICD-10-CM

## 2016-09-08 DIAGNOSIS — I10 Essential (primary) hypertension: Secondary | ICD-10-CM

## 2016-09-08 DIAGNOSIS — Z5111 Encounter for antineoplastic chemotherapy: Secondary | ICD-10-CM | POA: Diagnosis not present

## 2016-09-08 DIAGNOSIS — Z95828 Presence of other vascular implants and grafts: Secondary | ICD-10-CM

## 2016-09-08 DIAGNOSIS — R21 Rash and other nonspecific skin eruption: Secondary | ICD-10-CM | POA: Diagnosis not present

## 2016-09-08 DIAGNOSIS — Z1509 Genetic susceptibility to other malignant neoplasm: Secondary | ICD-10-CM

## 2016-09-08 DIAGNOSIS — Z1501 Genetic susceptibility to malignant neoplasm of breast: Secondary | ICD-10-CM

## 2016-09-08 DIAGNOSIS — R5383 Other fatigue: Secondary | ICD-10-CM

## 2016-09-08 LAB — COMPREHENSIVE METABOLIC PANEL
ALBUMIN: 3.6 g/dL (ref 3.5–5.0)
ALK PHOS: 81 U/L (ref 40–150)
ALT: 16 U/L (ref 0–55)
AST: 23 U/L (ref 5–34)
Anion Gap: 8 mEq/L (ref 3–11)
BUN: 11.8 mg/dL (ref 7.0–26.0)
CHLORIDE: 106 meq/L (ref 98–109)
CO2: 26 meq/L (ref 22–29)
Calcium: 9 mg/dL (ref 8.4–10.4)
Creatinine: 0.6 mg/dL (ref 0.6–1.1)
GLUCOSE: 87 mg/dL (ref 70–140)
POTASSIUM: 3.9 meq/L (ref 3.5–5.1)
SODIUM: 140 meq/L (ref 136–145)
Total Bilirubin: 0.22 mg/dL (ref 0.20–1.20)
Total Protein: 6.1 g/dL — ABNORMAL LOW (ref 6.4–8.3)

## 2016-09-08 LAB — CBC WITH DIFFERENTIAL/PLATELET
BASO%: 1.3 % (ref 0.0–2.0)
Basophils Absolute: 0 10*3/uL (ref 0.0–0.1)
EOS%: 2.9 % (ref 0.0–7.0)
Eosinophils Absolute: 0.1 10*3/uL (ref 0.0–0.5)
HEMATOCRIT: 29.8 % — AB (ref 34.8–46.6)
HGB: 10 g/dL — ABNORMAL LOW (ref 11.6–15.9)
LYMPH#: 0.8 10*3/uL — AB (ref 0.9–3.3)
LYMPH%: 21.2 % (ref 14.0–49.7)
MCH: 35.7 pg — AB (ref 25.1–34.0)
MCHC: 33.5 g/dL (ref 31.5–36.0)
MCV: 106.4 fL — ABNORMAL HIGH (ref 79.5–101.0)
MONO#: 0.5 10*3/uL (ref 0.1–0.9)
MONO%: 13.9 % (ref 0.0–14.0)
NEUT%: 60.7 % (ref 38.4–76.8)
NEUTROS ABS: 2.2 10*3/uL (ref 1.5–6.5)
Platelets: 248 10*3/uL (ref 145–400)
RBC: 2.8 10*6/uL — AB (ref 3.70–5.45)
RDW: 14.4 % (ref 11.2–14.5)
WBC: 3.7 10*3/uL — AB (ref 3.9–10.3)

## 2016-09-08 MED ORDER — HEPARIN SOD (PORK) LOCK FLUSH 100 UNIT/ML IV SOLN
500.0000 [IU] | Freq: Once | INTRAVENOUS | Status: AC | PRN
  Administered 2016-09-08: 500 [IU]
  Filled 2016-09-08: qty 5

## 2016-09-08 MED ORDER — SODIUM CHLORIDE 0.9% FLUSH
10.0000 mL | INTRAVENOUS | Status: DC | PRN
  Administered 2016-09-08 (×2): 10 mL via INTRAVENOUS
  Filled 2016-09-08: qty 10

## 2016-09-08 MED ORDER — ONDANSETRON HCL 8 MG PO TABS
8.0000 mg | ORAL_TABLET | Freq: Once | ORAL | Status: AC
  Administered 2016-09-08: 8 mg via ORAL

## 2016-09-08 MED ORDER — SODIUM CHLORIDE 0.9% FLUSH
10.0000 mL | INTRAVENOUS | Status: DC | PRN
  Filled 2016-09-08: qty 10

## 2016-09-08 MED ORDER — SODIUM CHLORIDE 0.9 % IV SOLN
Freq: Once | INTRAVENOUS | Status: AC
  Administered 2016-09-08: 11:00:00 via INTRAVENOUS

## 2016-09-08 MED ORDER — ONDANSETRON HCL 8 MG PO TABS
ORAL_TABLET | ORAL | Status: AC
  Filled 2016-09-08: qty 1

## 2016-09-08 MED ORDER — PACLITAXEL PROTEIN-BOUND CHEMO INJECTION 100 MG
100.0000 mg/m2 | Freq: Once | INTRAVENOUS | Status: AC
  Administered 2016-09-08: 175 mg via INTRAVENOUS
  Filled 2016-09-08: qty 35

## 2016-09-08 NOTE — Patient Instructions (Signed)
[facility] Discharge Instructions for Patients Receiving Chemotherapy  Today you received the following chemotherapy agents Abraxane  To help prevent nausea and vomiting after your treatment, we encourage you to take your nausea medication    If you develop nausea and vomiting that is not controlled by your nausea medication, call the clinic.   BELOW ARE SYMPTOMS THAT SHOULD BE REPORTED IMMEDIATELY:  *FEVER GREATER THAN 100.5 F  *CHILLS WITH OR WITHOUT FEVER  NAUSEA AND VOMITING THAT IS NOT CONTROLLED WITH YOUR NAUSEA MEDICATION  *UNUSUAL SHORTNESS OF BREATH  *UNUSUAL BRUISING OR BLEEDING  TENDERNESS IN MOUTH AND THROAT WITH OR WITHOUT PRESENCE OF ULCERS  *URINARY PROBLEMS  *BOWEL PROBLEMS  UNUSUAL RASH Items with * indicate a potential emergency and should be followed up as soon as possible.  Feel free to call the clinic you have any questions or concerns. The clinic phone number is [phone].  Please show the CHEMO ALERT CARD at check-in to the Emergency Department and triage nurse.   

## 2016-09-08 NOTE — Patient Instructions (Signed)
Implanted Port Home Guide An implanted port is a type of central line that is placed under the skin. Central lines are used to provide IV access when treatment or nutrition needs to be given through a person's veins. Implanted ports are used for long-term IV access. An implanted port may be placed because:  You need IV medicine that would be irritating to the small veins in your hands or arms.  You need long-term IV medicines, such as antibiotics.  You need IV nutrition for a long period.  You need frequent blood draws for lab tests.  You need dialysis.  Implanted ports are usually placed in the chest area, but they can also be placed in the upper arm, the abdomen, or the leg. An implanted port has two main parts:  Reservoir. The reservoir is round and will appear as a small, raised area under your skin. The reservoir is the part where a needle is inserted to give medicines or draw blood.  Catheter. The catheter is a thin, flexible tube that extends from the reservoir. The catheter is placed into a large vein. Medicine that is inserted into the reservoir goes into the catheter and then into the vein.  How will I care for my incision site? Do not get the incision site wet. Bathe or shower as directed by your health care provider. How is my port accessed? Special steps must be taken to access the port:  Before the port is accessed, a numbing cream can be placed on the skin. This helps numb the skin over the port site.  Your health care provider uses a sterile technique to access the port. ? Your health care provider must put on a mask and sterile gloves. ? The skin over your port is cleaned carefully with an antiseptic and allowed to dry. ? The port is gently pinched between sterile gloves, and a needle is inserted into the port.  Only "non-coring" port needles should be used to access the port. Once the port is accessed, a blood return should be checked. This helps ensure that the port  is in the vein and is not clogged.  If your port needs to remain accessed for a constant infusion, a clear (transparent) bandage will be placed over the needle site. The bandage and needle will need to be changed every week, or as directed by your health care provider.  Keep the bandage covering the needle clean and dry. Do not get it wet. Follow your health care provider's instructions on how to take a shower or bath while the port is accessed.  If your port does not need to stay accessed, no bandage is needed over the port.  What is flushing? Flushing helps keep the port from getting clogged. Follow your health care provider's instructions on how and when to flush the port. Ports are usually flushed with saline solution or a medicine called heparin. The need for flushing will depend on how the port is used.  If the port is used for intermittent medicines or blood draws, the port will need to be flushed: ? After medicines have been given. ? After blood has been drawn. ? As part of routine maintenance.  If a constant infusion is running, the port may not need to be flushed.  How long will my port stay implanted? The port can stay in for as long as your health care provider thinks it is needed. When it is time for the port to come out, surgery will be   done to remove it. The procedure is similar to the one performed when the port was put in. When should I seek immediate medical care? When you have an implanted port, you should seek immediate medical care if:  You notice a bad smell coming from the incision site.  You have swelling, redness, or drainage at the incision site.  You have more swelling or pain at the port site or the surrounding area.  You have a fever that is not controlled with medicine.  This information is not intended to replace advice given to you by your health care provider. Make sure you discuss any questions you have with your health care provider. Document  Released: 01/24/2005 Document Revised: 07/02/2015 Document Reviewed: 10/01/2012 Elsevier Interactive Patient Education  2017 [employer]  

## 2016-09-15 ENCOUNTER — Telehealth: Payer: Self-pay

## 2016-09-15 ENCOUNTER — Other Ambulatory Visit (HOSPITAL_BASED_OUTPATIENT_CLINIC_OR_DEPARTMENT_OTHER): Payer: BLUE CROSS/BLUE SHIELD

## 2016-09-15 ENCOUNTER — Ambulatory Visit (HOSPITAL_BASED_OUTPATIENT_CLINIC_OR_DEPARTMENT_OTHER): Payer: BLUE CROSS/BLUE SHIELD

## 2016-09-15 VITALS — BP 122/93 | HR 77 | Temp 98.1°F | Resp 16

## 2016-09-15 DIAGNOSIS — Z170 Estrogen receptor positive status [ER+]: Principal | ICD-10-CM

## 2016-09-15 DIAGNOSIS — C50111 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 female breast: Secondary | ICD-10-CM

## 2016-09-15 DIAGNOSIS — Z5111 Encounter for antineoplastic chemotherapy: Secondary | ICD-10-CM

## 2016-09-15 LAB — COMPREHENSIVE METABOLIC PANEL
ALT: 16 U/L (ref 0–55)
ANION GAP: 10 meq/L (ref 3–11)
AST: 22 U/L (ref 5–34)
Albumin: 3.7 g/dL (ref 3.5–5.0)
Alkaline Phosphatase: 92 U/L (ref 40–150)
BUN: 10.5 mg/dL (ref 7.0–26.0)
CALCIUM: 9.6 mg/dL (ref 8.4–10.4)
CHLORIDE: 104 meq/L (ref 98–109)
CO2: 26 meq/L (ref 22–29)
CREATININE: 0.7 mg/dL (ref 0.6–1.1)
EGFR: >90 mL/min/{1.73_m2} (ref >90)
Glucose: 97 mg/dl (ref 70–140)
POTASSIUM: 4.3 meq/L (ref 3.5–5.1)
Sodium: 140 mEq/L (ref 136–145)
Total Bilirubin: 0.35 mg/dL (ref 0.20–1.20)
Total Protein: 6.3 g/dL — ABNORMAL LOW (ref 6.4–8.3)

## 2016-09-15 LAB — CBC WITH DIFFERENTIAL/PLATELET
BASO%: 1.2 % (ref 0.0–2.0)
Basophils Absolute: 0 10*3/uL (ref 0.0–0.1)
EOS ABS: 0.2 10*3/uL (ref 0.0–0.5)
EOS%: 4.2 % (ref 0.0–7.0)
HCT: 31.3 % — ABNORMAL LOW (ref 34.8–46.6)
HEMOGLOBIN: 10.8 g/dL — AB (ref 11.6–15.9)
LYMPH#: 0.8 10*3/uL — AB (ref 0.9–3.3)
LYMPH%: 22.5 % (ref 14.0–49.7)
MCH: 36.5 pg — AB (ref 25.1–34.0)
MCHC: 34.5 g/dL (ref 31.5–36.0)
MCV: 106 fL — ABNORMAL HIGH (ref 79.5–101.0)
MONO#: 0.6 10*3/uL (ref 0.1–0.9)
MONO%: 16 % — AB (ref 0.0–14.0)
NEUT%: 56.1 % (ref 38.4–76.8)
NEUTROS ABS: 2.1 10*3/uL (ref 1.5–6.5)
PLATELETS: 280 10*3/uL (ref 145–400)
RBC: 2.95 10*6/uL — AB (ref 3.70–5.45)
RDW: 13.4 % (ref 11.2–14.5)
WBC: 3.7 10*3/uL — AB (ref 3.9–10.3)

## 2016-09-15 MED ORDER — ONDANSETRON HCL 8 MG PO TABS
8.0000 mg | ORAL_TABLET | Freq: Once | ORAL | Status: AC
  Administered 2016-09-15: 8 mg via ORAL

## 2016-09-15 MED ORDER — SODIUM CHLORIDE 0.9% FLUSH
10.0000 mL | INTRAVENOUS | Status: DC | PRN
  Administered 2016-09-15: 10 mL
  Filled 2016-09-15: qty 10

## 2016-09-15 MED ORDER — ONDANSETRON HCL 8 MG PO TABS
ORAL_TABLET | ORAL | Status: AC
  Filled 2016-09-15: qty 1

## 2016-09-15 MED ORDER — HEPARIN SOD (PORK) LOCK FLUSH 100 UNIT/ML IV SOLN
500.0000 [IU] | Freq: Once | INTRAVENOUS | Status: AC | PRN
  Administered 2016-09-15: 500 [IU]
  Filled 2016-09-15: qty 5

## 2016-09-15 MED ORDER — PACLITAXEL PROTEIN-BOUND CHEMO INJECTION 100 MG
100.0000 mg/m2 | Freq: Once | INTRAVENOUS | Status: AC
  Administered 2016-09-15: 175 mg via INTRAVENOUS
  Filled 2016-09-15: qty 35

## 2016-09-15 MED ORDER — SODIUM CHLORIDE 0.9 % IV SOLN
Freq: Once | INTRAVENOUS | Status: AC
  Administered 2016-09-15: 09:00:00 via INTRAVENOUS

## 2016-09-15 NOTE — Patient Instructions (Signed)
[facility] Discharge Instructions for Patients Receiving Chemotherapy  Today you received the following chemotherapy agents Abraxane  To help prevent nausea and vomiting after your treatment, we encourage you to take your nausea medication    If you develop nausea and vomiting that is not controlled by your nausea medication, call the clinic.   BELOW ARE SYMPTOMS THAT SHOULD BE REPORTED IMMEDIATELY:  *FEVER GREATER THAN 100.5 F  *CHILLS WITH OR WITHOUT FEVER  NAUSEA AND VOMITING THAT IS NOT CONTROLLED WITH YOUR NAUSEA MEDICATION  *UNUSUAL SHORTNESS OF BREATH  *UNUSUAL BRUISING OR BLEEDING  TENDERNESS IN MOUTH AND THROAT WITH OR WITHOUT PRESENCE OF ULCERS  *URINARY PROBLEMS  *BOWEL PROBLEMS  UNUSUAL RASH Items with * indicate a potential emergency and should be followed up as soon as possible.  Feel free to call the clinic you have any questions or concerns. The clinic phone number is [phone].  Please show the CHEMO ALERT CARD at check-in to the Emergency Department and triage nurse.   

## 2016-09-15 NOTE — Telephone Encounter (Signed)
Spoke with [name] concerning Abraxane dose due to office visit from last dose to lower dose to 80 mg/m2.  Patient does not report any increased fatigue or other adverse events and wishes to maintain current dose of 100 mg/m2.  Orders received from [name] to maintain dose at 100 mg/m2 today.  Henreitta Leber, PharmD

## 2016-09-16 ENCOUNTER — Other Ambulatory Visit: Payer: Self-pay | Admitting: Physician Assistant

## 2016-09-19 ENCOUNTER — Other Ambulatory Visit: Payer: Self-pay | Admitting: Physician Assistant

## 2016-09-21 ENCOUNTER — Telehealth: Payer: Self-pay | Admitting: Obstetrics and Gynecology

## 2016-09-21 NOTE — Telephone Encounter (Signed)
Patient wants to schedule surgery  

## 2016-09-21 NOTE — Telephone Encounter (Signed)
Return call to patient. Left message to call back.  Last seen in office 04-29-16 for annual exam.

## 2016-09-21 NOTE — Progress Notes (Signed)
[place]  [phone] [phone]  Clinic Follow Up Note   Patient Care Team: [name], MD as PCP - General (Internal Medicine) Verl Blalock, Marijo Conception, MD (Inactive) as Consulting Physician (Cardiology) [name], MD as Consulting Physician (Dermatology) 09/22/2016    CHIEF COMPLAINTS:  Follow up right breast cancer  Oncology History   Cancer Staging 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 breast in female, estrogen receptor positive ([employer]) Staging form: Breast, AJCC 8th Edition - Clinical stage from 03/08/2016: Stage IA (cT1c, cN0, cM0, G2, ER: Positive, PR: Negative, HER2: Negative) - Signed by Truitt Merle, MD on 03/15/2016 - Pathologic stage from 04/12/2016: Stage IA (pT1c, pN0, cM0, G2, ER: Positive, PR: Negative, HER2: Negative, Oncotype DX score: 49) - Signed by Truitt Merle, MD on 05/05/2016       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 breast in female, estrogen receptor positive ([place])   03/04/2016 Mammogram    Diagnostic bilateral mammogram and right breast ultrasound showed a 1.7 cm mass in the 12:00 position of the right breast, highly suspicious for malignancy. There is a borderline abnormal right axillary lymph node, also suspicious for metastasis.       03/08/2016 Initial Diagnosis    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 breast in female, estrogen receptor positive ([place])      03/08/2016 Initial Biopsy    Right breast 12:00 core needle biopsy showed invasive ductal carcinoma, grade 2, right axillary lymph node biopsy was negative.      03/08/2016 Receptors her2    ER 20% positive, PR negative, HER-2 negative, Ki-67 20%      03/30/2016 Imaging    MR Bilateral Breast 03/30/2016 IMPRESSION: Known unifocal right breast malignancy. No MRI evidence of malignancy in the left breast.      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  Genes Analyzed: 43 genes on  [facility]'s Common Cancers panel (APC, ATM, AXIN2, BARD1, BMPR1A, BRCA1, BRCA2, BRIP1, CDH1, CDKN2A, CHEK2, DICER1, EPCAM, GREM1, HOXB13, KIT, MEN1, MLH1, MSH2, MSH6, MUTYH, NBN, NF1, PALB2, PDGFRA, PMS2, POLD1, POLE, PTEN, RAD50, RAD51C, RAD51D, SDHA, SDHB, SDHC, SDHD, SMAD4, SMARCA4, STK11, TP53, TSC1, TSC2, VHL).       04/12/2016 Surgery    Bilateral mastectomy and right sentinel lymph node biopsy, by [name]      04/12/2016 Pathology Results    Right breast mastectomy showed invasive grade 2 invasive ductal carcinoma, 1.7 cm, margins were negative, 3 sentinel lymph nodes were negative, left simple mastectomy fibroadenoma, one benign node, no atypia or tumor.       05/04/2016 Oncotype testing    Recurrence score 49, high-risk, predicts 10 year risk of distant recurrence 32% with tamoxifen alone.      05/10/2016 Echocardiogram    Echo on 05/10/16 showed EF 55-60%.      05/20/2016 -  Chemotherapy    dose dense Adriamycin and Cytoxan, every 2 weeks starting 05/20/16 for 4 cycles, followed by Taxol starting 07/14/16 (weekly for 12 weeks or biweekly for 4 cycles) ending 09/30/16. Weekly Taxol Changed to Abaraxane 131m/m2 on 09/01/17 due to skin rash.        [place], [place] ([facility]   04/12/2016 Initial Diagnosis    [place]      HISTORY OF PRESENTING ILLNESS (03/16/16):  Chelsea CLIPPARD61y.o. female is here because of recently diagnosed right breast invasive mammary carcinoma. She is accompanied by her husband to our multidisciplinary  breast clinic today.  The patient underwent routine screening mammogram on 02/29/2016 and was found to have possible architectural distortion and asymmetry in the right breast. Also found was asymmetry in the left breast.  Accordingly a bilateral diagnostic mammogram and unilateral right breast ultrasound were performed on 03/04/2016. These scans showed a 1.7 cm mass in the 12 o'clock position of the right breast with imaging features highly  suspicious for malignancy. Also seen was borderline abnormal right axillary lymph node, suspicious for a metastatic node.  Biopsy of the right breast 12:00 o'clock position on 03/08/2016 revealed invasive mammary carcinoma. The carcinoma appears to be at least grade 2, ER positive, PR negative, HER-2 negative, Ki67 20%. The malignant cells are positive for E-Cadherin, supporting a ductal phenotype. Biopsy of the right axillary lymph node showed no evidence of carcinoma.   The patient reports to the clinic today for follow up and to discuss chemotherapy. She has had surgery about 3 weeks ago and she has been doing well. She started antibiotics yesterday to help with infection that she has recently gotten. She has also started doing PT this week. She decided not to do reconstruction. She has been eating well.    GYN HISTORY  Menarchal:12 LMP: late 89's (hot flushes since mid 40's)  Contraceptive: 23 HRT: 3-4 years  G0P0:  CURRENT THERAPY: dose dense Adriamycin and Cytoxan, every 2 weeks starting 05/20/16 for 4 cycles, followed by weekly Taxol for 12 weeks starting 07/14/16, Weekly Taxol changed to Abraxane on 09/01/16 due to skin rash.   INTERVAL HISTORY:  ARA GRANDMAISON returns for follow up and weekly Taxol. She is receiving cycle 11. She presents to the clinic today reporting her last chemo went well. Her fatigue was tolerable with days having enough energy to being active with yoga, riding and walking. She denies neuropathy. She does get nail color change. She does not check her BP but did drink coffee before coming here. She is taking high does Effexor 1-2 times a day with clonadine for her sever hot flashes. She has soreness near her incision. She did go to PT but went back to yoga for exercise.  Her ROM is better and her strength is coming back.    MEDICAL HISTORY:  Past Medical History:  Diagnosis Date  . Abnormal Pap smear of cervix    --in her 20s had colpo and some type of treatment to  cervix.  . Allergy   . Asthma    "all the time, throughout the yr"  . 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 Breast cancer, right breast ([place] [place]) 02/2016  . Eczema   . Family history of breast cancer   . Family history of genetic disease carrier    paternal relatives with a BRCA mutation  . Family history of ovarian cancer   . Hypertension   . Pneumonia    2000ish  . STD (sexually transmitted disease)    HSV II    SURGICAL HISTORY: Past Surgical History:  Procedure Laterality Date  . BREAST BIOPSY Right 04/13/2016   Procedure: Evacuation RIGHT Breast  Hematoma;  Surgeon: [name], MD;  Location: [facility];  Service: General;  Laterality: Right;  . COLONOSCOPY    . DILATION AND CURETTAGE OF UTERUS  2008  . MASTECTOMY Left 04/12/2016   prophylactic total mastectomy  . MASTECTOMY COMPLETE / SIMPLE W/ SENTINEL NODE BIOPSY Right 04/12/2016   axillary sentinel LNB  . MASTECTOMY W/ SENTINEL NODE BIOPSY  Bilateral 04/12/2016   Procedure: BILATERAL TOTAL MASTECTOMIES  WITH RIGHT SENTINEL LYMPH NODE BIOPSY;  Surgeon: [name], MD;  Location: [facility];  Service: General;  Laterality: Bilateral;  . PORTACATH PLACEMENT Right 05/19/2016   Procedure: INSERTION PORT-A-CATH WITH [place];  Surgeon: [name], MD;  Location: [facility];  Service: General;  Laterality: Right;    SOCIAL HISTORY: Social History   Social History  . Marital status: Married    Spouse name: N/A  . Number of children: N/A  . Years of education: N/A   Occupational History  . Not on file.   Social History Main Topics  . Smoking status: Former Smoker    Packs/day: 1.00    Years: 25.00    Quit date: 02/08/1996  . Smokeless tobacco: Never Used  . Alcohol use 13.2 oz/week    15 Standard drinks or equivalent, 7 Glasses of wine per week  . Drug use: No  . Sexual activity: No   Other Topics Concern  . Not on file   Social History Narrative  . No narrative on file    FAMILY  HISTORY: Family History  Problem Relation Age of Onset  . Polymyalgia rheumatica Mother   . Hypertension Father   . Heart disease Father        Dec 69  . Heart attack Father   . Prostate cancer Father        Dx 42s; deceased 31  . Breast cancer Cousin        Dx 51s; daughter of a paternal uncle  . Ovarian cancer Paternal Aunt        Dx 28; deceased  . Ovarian cancer Cousin        Dx 1s; daughter of a paternal uncle  . Ovarian cancer Cousin        Dx 67; daughter of a paternal uncle  . Ovarian cancer Paternal Aunt        Dx 31s.  BRCA positive  . Ovarian cancer Other        pat grandfather's mother    ALLERGIES:  has No Known Allergies.  MEDICATIONS:  Current Outpatient Prescriptions  Medication Sig Dispense Refill  . acetaminophen (TYLENOL) 500 MG tablet Take 500-1,000 mg by mouth every 6 (six) hours as needed for mild pain or headache (depends on pain if takes 1-2 tablets).    Marland Kitchen acyclovir ointment (ZOVIRAX) 5 % Apply 1 application topically every 3 (three) hours. 15 g 1  . aspirin EC 81 MG tablet Take 81 mg by mouth daily.    . cetirizine (ZYRTEC) 10 MG tablet Take 10 mg by mouth at bedtime.     . Cholecalciferol (VITAMIN D) 2000 units tablet Take 2,000 Units by mouth daily.    . cloNIDine (CATAPRES) 0.2 MG tablet TAKE 1 TABLET(0.2 MG) BY MOUTH AT BEDTIME 90 tablet 0  . escitalopram (LEXAPRO) 20 MG tablet TAKE 1 TABLET(20 MG) BY MOUTH DAILY 90 tablet 0  . Flaxseed, Linseed, (FLAXSEED OIL PO) Take 1,400 mg by mouth every evening.    . fluticasone (FLONASE ALLERGY RELIEF) 50 MCG/ACT nasal spray Place 1 spray into both nostrils daily.    Marland Kitchen lidocaine-prilocaine (EMLA) cream Apply 1 application topically as needed. 30 g 3  . losartan (COZAAR) 100 MG tablet Take 1 tablet (100 mg total) by mouth daily. 30 tablet 11  . magnesium oxide (MAG-OX) 400 MG tablet Take 400 mg by mouth every other day.    . montelukast (SINGULAIR) 10 MG tablet TAKE  1 TABLET(10 MG) BY MOUTH DAILY 90 tablet  0  . Multiple Vitamins-Minerals (MULTIVITAMIN WITH MINERALS) tablet Take 1 tablet by mouth every evening.     . Omega-3 Fatty Acids ([employer] FISH OIL) 1200 MG CAPS Take 1,200 mg by mouth every evening.     . venlafaxine XR (EFFEXOR-XR) 75 MG 24 hr capsule TAKE 2 CAPSULES BY MOUTH EVERY MORNING AND 1 CAPSULE EVERY EVENING 270 capsule 1  . vitamin B-12 (CYANOCOBALAMIN) 100 MCG tablet Take 100 mcg by mouth every other day.     . anastrozole (ARIMIDEX) 1 MG tablet Take 1 tablet (1 mg total) by mouth daily. 30 tablet 2  . ondansetron (ZOFRAN) 8 MG tablet Take 1 tablet (8 mg total) by mouth 2 (two) times daily as needed. Start on the third day after chemotherapy. (Patient not taking: Reported on 08/11/2016) 30 tablet 1  . prochlorperazine (COMPAZINE) 10 MG tablet Take 1 tablet (10 mg total) by mouth every 6 (six) hours as needed (Nausea or vomiting). (Patient not taking: Reported on 09/22/2016) 30 tablet 1  . valACYclovir (VALTREX) 500 MG tablet Take 1 tablet (500 mg total) by mouth 2 (two) times daily. Take for 3 days as needed. (Patient not taking: Reported on 08/11/2016) 30 tablet 1   No current facility-administered medications for this visit.    Facility-Administered Medications Ordered in Other Visits  Medication Dose Route Frequency Provider Last Rate Last Dose  . sodium chloride flush (NS) 0.9 % injection 10 mL  10 mL Intravenous PRN Truitt Merle, MD   10 mL at 06/30/16 0844  . sodium chloride flush (NS) 0.9 % injection 10 mL  10 mL Intravenous PRN Truitt Merle, MD   10 mL at 09/08/16 1329    REVIEW OF SYSTEMS:  Constitutional: Denies fevers, chills or abnormal night sweats  (+) fatigue, improving  Eyes: Denies blurriness of vision, double vision or watery eyes Ears, nose, mouth, throat, and face: Denies mucositis or sore throat Respiratory: Denies cough, dyspnea or wheezes Cardiovascular: Denies palpitation, chest discomfort or lower extremity swelling Gastrointestinal:  Denies heartburn  Skin:  (+)  darkening of nails  Lymphatics: Denies new lymphadenopathy or easy bruising Neurological:Denies numbness, tingling or new weaknesses BREAST: (+) soreness and numbness near incision site  Behavioral/Psych: Mood is stable, no new changes  All other systems were reviewed with the patient and are negative.  PHYSICAL EXAMINATION: ECOG PERFORMANCE STATUS: 1  Vitals:   09/22/16 1040  BP: (!) 153/94  Pulse: 81  Resp: 20  Temp: 97.9 F (36.6 C)  TempSrc: Oral  SpO2: 100%  Weight: 156 lb (70.8 kg)  Height: 5' 4"  (1.626 m)     GENERAL:alert, no distress and comfortable SKIN: skin color, texture, turgor are normal, no rashes or significant lesions, Dryness pof skin and color changes to nail nail bed. EYES: normal, conjunctiva are pink and non-injected, sclera clear OROPHARYNX:no exudate, no erythema and lips, buccal mucosa, and tongue normal, no oral thrush NECK: supple, thyroid normal size, non-tender, without nodularity LYMPH:  no palpable lymphadenopathy in the cervical, axillary or inguinal LUNGS: clear to auscultation and percussion with normal breathing effort HEART: regular rate & rhythm and no murmurs and no lower extremity edema ABDOMEN:abdomen soft, non-tender and normal bowel sounds Musculoskeletal:no cyanosis of digits and no clubbing  PSYCH: alert & oriented x 3 with fluent speech NEURO: no focal motor/sensory deficits Breasts: Breast inspection showed Status post bilateral mastectomy, surgical incisions have healed well with no discharge or surrounding skin Erythema or nodularity .  Soft tissue fullness in left axilla   LABORATORY DATA:  I have reviewed the data as listed CBC Latest Ref Rng & Units 09/22/2016 09/15/2016 09/08/2016  WBC 3.9 - 10.3 10e3/uL 4.3 3.7(L) 3.7(L)  Hemoglobin 11.6 - 15.9 g/dL 10.8(L) 10.8(L) 10.0(L)  Hematocrit 34.8 - 46.6 % 31.9(L) 31.3(L) 29.8(L)  Platelets 145 - 400 10e3/uL 297 280 248    CMP Latest Ref Rng & Units 09/22/2016 09/15/2016 09/08/2016    Glucose 70 - 140 mg/dl 112 97 87  BUN 7.0 - 26.0 mg/dL 11.1 10.5 11.8  Creatinine 0.6 - 1.1 mg/dL 0.7 0.7 0.6  Sodium 136 - 145 mEq/L 141 140 140  Potassium 3.5 - 5.1 mEq/L 4.1 4.3 3.9  Chloride 101 - 111 mmol/L - - -  CO2 22 - 29 mEq/L 26 26 26   Calcium 8.4 - 10.4 mg/dL 9.5 9.6 9.0  Total Protein 6.4 - 8.3 g/dL 6.3(L) 6.3(L) 6.1(L)  Total Bilirubin 0.20 - 1.20 mg/dL 0.28 0.35 0.22  Alkaline Phos 40 - 150 U/L 95 92 81  AST 5 - 34 U/L 18 22 23   ALT 0 - 55 U/L 13 16 16     PATHOLOGY  Diagnosis 04/12/16 1. Breast, simple mastectomy, Left Prophylactic Total - BREAST PARENCHYMA SHOWING FIBROCYSTIC CHANGES WITH APOCRINE METAPLASIA AND FLORID USUAL DUCTAL HYPERPLASIA. - FIBROADENOMA. - ONE BENIGN LYMPH NODE (0/1). - NO ATYPIA OR TUMOR SEEN. - GROSSLY UNREMARKABLE SKIN. 2. Breast, simple mastectomy, Right Total - INVASIVE, GRADE 2 DUCTAL CARCINOMA, SPANNING 1.7 CM IN GREATEST DIMENSION. - MARGINS ARE NEGATIVE. - GROSSLY UNREMARKABLE SKIN. - SEE ONCOLOGY TEMPLATE. 3. Lymph node, sentinel, biopsy, Right Axillary #1 - ONE BENIGN WITH NO TUMOR SEEN (0/1). - SEE COMMENT. 4. Lymph node, sentinel, biopsy, Right - ONE BENIGN WITH NO TUMOR SEEN (0/1). - SEE COMMENT. 5. Lymph node, sentinel, biopsy, Right - ONE BENIGN WITH NO TUMOR SEEN (0/1). - SEE COMMENT.  Diagnosis 03/08/2016 1. Breast, right, needle core biopsy, 12:00 o'clock - INVASIVE MAMMARY CARCINOMA. - SEE COMMENT. 2. Lymph node, needle/core biopsy, right axilla - THERE IS NO EVIDENCE OF CARCINOMA IN 1 OF 1 LYMPH NODE (0/1).   PROCEDURES  ECHO 05/10/16 Study Conclusions - Left ventricle: The cavity size was normal. Systolic function was   normal. The estimated ejection fraction was in the range of 55%   to 60%. Wall motion was normal; there were no regional wall   motion abnormalities. Doppler parameters are consistent with   abnormal left ventricular relaxation (grade 1 diastolic   dysfunction). There was no evidence of  elevated ventricular   filling pressure by Doppler parameters. - Aortic valve: Trileaflet; normal thickness leaflets. There was no   regurgitation. - Aortic root: The aortic root was normal in size. - Mitral valve: Structurally normal valve. There was no   regurgitation. - Right ventricle: The cavity size was normal. Wall thickness was   normal. Systolic function was normal. - Tricuspid valve: There was trivial regurgitation. - Pulmonary arteries: Systolic pressure was within the normal   range. - Inferior vena cava: The vessel was normal in size. - Pericardium, extracardiac: There was no pericardial effusion.   RADIOGRAPHIC STUDIES: I have personally reviewed the radiological images as listed and agreed with the findings in the report. No results found.   MR Bilateral Breast 03/30/2016 IMPRESSION: Known unifocal right breast malignancy. No MRI evidence of malignancy in the left breast.  Bilateral mammogram and [place] 03/04/16 IMPRESSION: 1. 1.7 cm mass in the 12 o'clock position of the right  breast with imaging features highly suspicious for malignancy. 2. Borderline abnormal right axillary lymph node, suspicious for a metastatic node.  Screening mammogram 02/29/16 IMPRESSION: Further evaluation is suggested for possible architectural distortion and asymmetry in the right breast.  ASSESSMENT & PLAN:  61 y.o. postmenopausal woman, presented with screening discovered right breast cancer  1. 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 Oncotype RS 49, high risk  - Patient has had bilateral total mastectomy on 04/12/16 and denied reconstruction.  -I previously reviewed her surgical pathology findings with patient and her husband in details. Her surgical margins were negative, 3 sentinel lymph nodes were also negative. - Her oncotype is back at 49. This is fairly high risk disease based  on the RS. the estimated 10 year risk of distant recurrence with tamoxifen alone is 32%. I previously, strongly recommend chemotherapy to reduce her risk of cancer recurrence after surgery (by ~50%). -she previously started adjuvant chemotherapy with dose dense Adriamycin and Cytoxan - Baseline Echo on 05/10/16 showed EF 55-60%. -she has been tolerating chemo very well overall, mild fatigue, and started having mild skin rashes on arms, possibly related to Taxol -Due to the skin rash, I have switched her Taxol to Abraxane, she is tolerating well, except more noticeable fatigue. She still functioning very well. If her fatigue gets worse, I'll decrease her dose of Abraxane. -No need for adjuvant therapy due to bilateral mastectomy. Will start on anti-estrogen therapy 1 month after chemotherapy -plan to have a bilateral BSO in 11-01/2017 -Labs reviewed and adequate to proceed with cycle 11 treatment today and cycle 12 next week  -She does not need RT due to her bilateral mastectomy. We plan to start anti-estrogen anastrozole in early- mid 10/2016. I discussed the side effects in great detail.  -The potential benefit and side effects, which includes but not limited to, hot flash, skin and vaginal dryness, metabolic changes ( increased blood glucose, cholesterol, weight, etc.), slightly in increased risk of cardiovascular disease, cataracts, muscular and joint discomfort, osteopenia and osteoporosis, etc, were discussed with her in great details. She is interested, and agrees to start next month -If she does have severe side effects from anastrozole, I'll be okay to stop due to her weak ER positivity and small benefit. -We discussed breast cancer surveillance after she completes treatment, Including annual mammogram, breast exam every 6-12 months. -F/u in 2 months    2. BRCA1 mutation (+) -Due to her strong family history of ovarian cancer and also positive family history of breast cancer, she underwent  genetic testing - Patient is positive for BRCA1 Mutation -We previously discussed the high risk of breast cancer and colon cancer due to the BRCA1 mutation. She is status post bilateral mastectomy. She is agreeable to have BSO, she has discussed with her gynecologist. I have recommended her to have this surgery done after she completes adjuvant chemotherapy. - Her sister has done genetic testing, results pending -She has no children.  3. 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 Bone Health -Last DEXA was 2+ years ago -Due to antiestrogen medication I suggest she use calcium/vit D Supplements  -Due for DEXA before next visit    Plan:  -Lab reviewed, adequately to proceed cycle 11 Abraxane today, last cycle chemotherapy next week.  -Order anastrozole today to start in early-mid 10/2016 -DEXA in 1-2 months  -Lab and f/u in 2 months  All questions were answered. The patient knows to call the clinic with any problems, questions or concerns. I spent 20 minutes counseling the patient face to face. The total time spent in the appointment was 25  minutes and more than 50% was on counseling.  This document serves as a record of services personally performed by Truitt Merle, MD. It was created on her behalf by Joslyn Devon, a trained medical scribe. The creation of this record is based on the scribe's personal observations and the provider's statements to them. This document has been checked and approved by the attending provider.    Truitt Merle  09/22/16

## 2016-09-22 ENCOUNTER — Encounter: Payer: Self-pay | Admitting: Hematology

## 2016-09-22 ENCOUNTER — Ambulatory Visit (HOSPITAL_BASED_OUTPATIENT_CLINIC_OR_DEPARTMENT_OTHER): Payer: BLUE CROSS/BLUE SHIELD

## 2016-09-22 ENCOUNTER — Telehealth: Payer: Self-pay | Admitting: Hematology

## 2016-09-22 ENCOUNTER — Ambulatory Visit: Payer: BLUE CROSS/BLUE SHIELD

## 2016-09-22 ENCOUNTER — Ambulatory Visit (HOSPITAL_BASED_OUTPATIENT_CLINIC_OR_DEPARTMENT_OTHER): Payer: BLUE CROSS/BLUE SHIELD | Admitting: Hematology

## 2016-09-22 ENCOUNTER — Other Ambulatory Visit (HOSPITAL_BASED_OUTPATIENT_CLINIC_OR_DEPARTMENT_OTHER): Payer: BLUE CROSS/BLUE SHIELD

## 2016-09-22 VITALS — BP 153/94 | HR 81 | Temp 97.9°F | Resp 20 | Ht 64.0 in | Wt 156.0 lb

## 2016-09-22 DIAGNOSIS — Z1509 Genetic susceptibility to other malignant neoplasm: Secondary | ICD-10-CM

## 2016-09-22 DIAGNOSIS — C50111 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 female breast: Secondary | ICD-10-CM

## 2016-09-22 DIAGNOSIS — D6481 Anemia due to antineoplastic chemotherapy: Secondary | ICD-10-CM | POA: Diagnosis not present

## 2016-09-22 DIAGNOSIS — Z5111 Encounter for antineoplastic chemotherapy: Secondary | ICD-10-CM

## 2016-09-22 DIAGNOSIS — N951 Menopausal and female climacteric states: Secondary | ICD-10-CM | POA: Diagnosis not present

## 2016-09-22 DIAGNOSIS — I10 Essential (primary) hypertension: Secondary | ICD-10-CM | POA: Diagnosis not present

## 2016-09-22 DIAGNOSIS — Z170 Estrogen receptor positive status [ER+]: Principal | ICD-10-CM

## 2016-09-22 DIAGNOSIS — Z1501 Genetic susceptibility to malignant neoplasm of breast: Secondary | ICD-10-CM

## 2016-09-22 LAB — COMPREHENSIVE METABOLIC PANEL
ALT: 13 U/L (ref 0–55)
AST: 18 U/L (ref 5–34)
Albumin: 3.7 g/dL (ref 3.5–5.0)
Alkaline Phosphatase: 95 U/L (ref 40–150)
Anion Gap: 8 mEq/L (ref 3–11)
BUN: 11.1 mg/dL (ref 7.0–26.0)
CALCIUM: 9.5 mg/dL (ref 8.4–10.4)
CHLORIDE: 107 meq/L (ref 98–109)
CO2: 26 meq/L (ref 22–29)
CREATININE: 0.7 mg/dL (ref 0.6–1.1)
EGFR: >90 mL/min/{1.73_m2} (ref >90)
Glucose: 112 mg/dl (ref 70–140)
POTASSIUM: 4.1 meq/L (ref 3.5–5.1)
SODIUM: 141 meq/L (ref 136–145)
Total Bilirubin: 0.28 mg/dL (ref 0.20–1.20)
Total Protein: 6.3 g/dL — ABNORMAL LOW (ref 6.4–8.3)

## 2016-09-22 LAB — CBC WITH DIFFERENTIAL/PLATELET
BASO%: 1.5 % (ref 0.0–2.0)
BASOS ABS: 0.1 10*3/uL (ref 0.0–0.1)
EOS%: 4.5 % (ref 0.0–7.0)
Eosinophils Absolute: 0.2 10*3/uL (ref 0.0–0.5)
HCT: 31.9 % — ABNORMAL LOW (ref 34.8–46.6)
HGB: 10.8 g/dL — ABNORMAL LOW (ref 11.6–15.9)
LYMPH#: 0.8 10*3/uL — AB (ref 0.9–3.3)
LYMPH%: 19.8 % (ref 14.0–49.7)
MCH: 36.2 pg — AB (ref 25.1–34.0)
MCHC: 33.9 g/dL (ref 31.5–36.0)
MCV: 106.7 fL — ABNORMAL HIGH (ref 79.5–101.0)
MONO#: 0.6 10*3/uL (ref 0.1–0.9)
MONO%: 13.4 % (ref 0.0–14.0)
NEUT#: 2.6 10*3/uL (ref 1.5–6.5)
NEUT%: 60.8 % (ref 38.4–76.8)
Platelets: 297 10*3/uL (ref 145–400)
RBC: 2.99 10*6/uL — AB (ref 3.70–5.45)
RDW: 13.6 % (ref 11.2–14.5)
WBC: 4.3 10*3/uL (ref 3.9–10.3)

## 2016-09-22 MED ORDER — SODIUM CHLORIDE 0.9 % IV SOLN
Freq: Once | INTRAVENOUS | Status: AC
  Administered 2016-09-22: 12:00:00 via INTRAVENOUS

## 2016-09-22 MED ORDER — ONDANSETRON HCL 8 MG PO TABS
8.0000 mg | ORAL_TABLET | Freq: Once | ORAL | Status: AC
  Administered 2016-09-22: 8 mg via ORAL

## 2016-09-22 MED ORDER — SODIUM CHLORIDE 0.9% FLUSH
10.0000 mL | INTRAVENOUS | Status: DC | PRN
  Administered 2016-09-22: 10 mL
  Filled 2016-09-22: qty 10

## 2016-09-22 MED ORDER — ANASTROZOLE 1 MG PO TABS: 1.0000 mg | ORAL_TABLET | Freq: Every day | ORAL | 2 refills | Status: DC

## 2016-09-22 MED ORDER — PACLITAXEL PROTEIN-BOUND CHEMO INJECTION 100 MG
100.0000 mg/m2 | Freq: Once | INTRAVENOUS | Status: AC
  Administered 2016-09-22: 175 mg via INTRAVENOUS
  Filled 2016-09-22: qty 35

## 2016-09-22 MED ORDER — ONDANSETRON HCL 8 MG PO TABS
ORAL_TABLET | ORAL | Status: AC
  Filled 2016-09-22: qty 1

## 2016-09-22 MED ORDER — HEPARIN SOD (PORK) LOCK FLUSH 100 UNIT/ML IV SOLN
500.0000 [IU] | Freq: Once | INTRAVENOUS | Status: AC | PRN
  Administered 2016-09-22: 500 [IU]
  Filled 2016-09-22: qty 5

## 2016-09-22 NOTE — Telephone Encounter (Signed)
Spoke with patient regarding her upcoming appointments. Did not want avs or calendars.

## 2016-09-22 NOTE — Patient Instructions (Signed)
[facility] Discharge Instructions for Patients Receiving Chemotherapy  Today you received the following chemotherapy agents Abraxane  To help prevent nausea and vomiting after your treatment, we encourage you to take your nausea medication    If you develop nausea and vomiting that is not controlled by your nausea medication, call the clinic.   BELOW ARE SYMPTOMS THAT SHOULD BE REPORTED IMMEDIATELY:  *FEVER GREATER THAN 100.5 F  *CHILLS WITH OR WITHOUT FEVER  NAUSEA AND VOMITING THAT IS NOT CONTROLLED WITH YOUR NAUSEA MEDICATION  *UNUSUAL SHORTNESS OF BREATH  *UNUSUAL BRUISING OR BLEEDING  TENDERNESS IN MOUTH AND THROAT WITH OR WITHOUT PRESENCE OF ULCERS  *URINARY PROBLEMS  *BOWEL PROBLEMS  UNUSUAL RASH Items with * indicate a potential emergency and should be followed up as soon as possible.  Feel free to call the clinic you have any questions or concerns. The clinic phone number is [phone].  Please show the CHEMO ALERT CARD at check-in to the Emergency Department and triage nurse.   

## 2016-09-26 NOTE — Telephone Encounter (Signed)
Patients's last chemotherapy is 09/29/16. Patient would like to have surgery the last week of November or the first week of December. Patient is aware that Gay Filler is out of the office.

## 2016-09-29 ENCOUNTER — Ambulatory Visit (HOSPITAL_BASED_OUTPATIENT_CLINIC_OR_DEPARTMENT_OTHER): Payer: BLUE CROSS/BLUE SHIELD

## 2016-09-29 ENCOUNTER — Encounter: Payer: Self-pay | Admitting: *Deleted

## 2016-09-29 ENCOUNTER — Other Ambulatory Visit (HOSPITAL_BASED_OUTPATIENT_CLINIC_OR_DEPARTMENT_OTHER): Payer: BLUE CROSS/BLUE SHIELD

## 2016-09-29 VITALS — BP 149/94 | HR 85 | Temp 98.2°F | Resp 18

## 2016-09-29 DIAGNOSIS — Z5111 Encounter for antineoplastic chemotherapy: Secondary | ICD-10-CM | POA: Diagnosis not present

## 2016-09-29 DIAGNOSIS — C50111 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 female breast: Secondary | ICD-10-CM

## 2016-09-29 DIAGNOSIS — Z170 Estrogen receptor positive status [ER+]: Principal | ICD-10-CM

## 2016-09-29 LAB — COMPREHENSIVE METABOLIC PANEL
ALT: 14 U/L (ref 0–55)
AST: 21 U/L (ref 5–34)
Albumin: 3.8 g/dL (ref 3.5–5.0)
Alkaline Phosphatase: 100 U/L (ref 40–150)
Anion Gap: 8 mEq/L (ref 3–11)
BUN: 12.7 mg/dL (ref 7.0–26.0)
CHLORIDE: 105 meq/L (ref 98–109)
CO2: 27 mEq/L (ref 22–29)
Calcium: 9.7 mg/dL (ref 8.4–10.4)
Creatinine: 0.7 mg/dL (ref 0.6–1.1)
EGFR: >90 mL/min/{1.73_m2} (ref >90)
GLUCOSE: 102 mg/dL (ref 70–140)
POTASSIUM: 4.3 meq/L (ref 3.5–5.1)
SODIUM: 139 meq/L (ref 136–145)
Total Bilirubin: 0.27 mg/dL (ref 0.20–1.20)
Total Protein: 6.7 g/dL (ref 6.4–8.3)

## 2016-09-29 LAB — CBC WITH DIFFERENTIAL/PLATELET
BASO%: 1.1 % (ref 0.0–2.0)
Basophils Absolute: 0.1 10*3/uL (ref 0.0–0.1)
EOS%: 4 % (ref 0.0–7.0)
Eosinophils Absolute: 0.2 10*3/uL (ref 0.0–0.5)
HEMATOCRIT: 33.2 % — AB (ref 34.8–46.6)
HGB: 11.1 g/dL — ABNORMAL LOW (ref 11.6–15.9)
LYMPH#: 1 10*3/uL (ref 0.9–3.3)
LYMPH%: 23 % (ref 14.0–49.7)
MCH: 35.6 pg — AB (ref 25.1–34.0)
MCHC: 33.4 g/dL (ref 31.5–36.0)
MCV: 106.4 fL — ABNORMAL HIGH (ref 79.5–101.0)
MONO#: 0.8 10*3/uL (ref 0.1–0.9)
MONO%: 16.7 % — ABNORMAL HIGH (ref 0.0–14.0)
NEUT#: 2.5 10*3/uL (ref 1.5–6.5)
NEUT%: 55.2 % (ref 38.4–76.8)
Platelets: 284 10*3/uL (ref 145–400)
RBC: 3.12 10*6/uL — ABNORMAL LOW (ref 3.70–5.45)
RDW: 13.1 % (ref 11.2–14.5)
WBC: 4.5 10*3/uL (ref 3.9–10.3)

## 2016-09-29 MED ORDER — DIPHENHYDRAMINE HCL 25 MG PO CAPS
25.0000 mg | ORAL_CAPSULE | Freq: Once | ORAL | Status: AC
  Administered 2016-09-29: 25 mg via ORAL

## 2016-09-29 MED ORDER — DIPHENHYDRAMINE HCL 25 MG PO CAPS
ORAL_CAPSULE | ORAL | Status: AC
  Filled 2016-09-29: qty 1

## 2016-09-29 MED ORDER — HEPARIN SOD (PORK) LOCK FLUSH 100 UNIT/ML IV SOLN
500.0000 [IU] | Freq: Once | INTRAVENOUS | Status: AC | PRN
  Administered 2016-09-29: 500 [IU]
  Filled 2016-09-29: qty 5

## 2016-09-29 MED ORDER — PACLITAXEL PROTEIN-BOUND CHEMO INJECTION 100 MG
100.0000 mg/m2 | Freq: Once | INTRAVENOUS | Status: AC
  Administered 2016-09-29: 175 mg via INTRAVENOUS
  Filled 2016-09-29: qty 35

## 2016-09-29 MED ORDER — ONDANSETRON HCL 8 MG PO TABS
8.0000 mg | ORAL_TABLET | Freq: Once | ORAL | Status: AC
  Administered 2016-09-29: 8 mg via ORAL

## 2016-09-29 MED ORDER — ONDANSETRON HCL 8 MG PO TABS
ORAL_TABLET | ORAL | Status: AC
  Filled 2016-09-29: qty 1

## 2016-09-29 MED ORDER — SODIUM CHLORIDE 0.9% FLUSH
10.0000 mL | INTRAVENOUS | Status: DC | PRN
  Administered 2016-09-29: 10 mL
  Filled 2016-09-29: qty 10

## 2016-09-29 MED ORDER — SODIUM CHLORIDE 0.9 % IV SOLN
Freq: Once | INTRAVENOUS | Status: AC
  Administered 2016-09-29: 09:00:00 via INTRAVENOUS

## 2016-09-29 NOTE — Patient Instructions (Signed)
[facility] Discharge Instructions for Patients Receiving Chemotherapy  Today you received the following chemotherapy agents Abraxane  To help prevent nausea and vomiting after your treatment, we encourage you to take your nausea medication    If you develop nausea and vomiting that is not controlled by your nausea medication, call the clinic.   BELOW ARE SYMPTOMS THAT SHOULD BE REPORTED IMMEDIATELY:  *FEVER GREATER THAN 100.5 F  *CHILLS WITH OR WITHOUT FEVER  NAUSEA AND VOMITING THAT IS NOT CONTROLLED WITH YOUR NAUSEA MEDICATION  *UNUSUAL SHORTNESS OF BREATH  *UNUSUAL BRUISING OR BLEEDING  TENDERNESS IN MOUTH AND THROAT WITH OR WITHOUT PRESENCE OF ULCERS  *URINARY PROBLEMS  *BOWEL PROBLEMS  UNUSUAL RASH Items with * indicate a potential emergency and should be followed up as soon as possible.  Feel free to call the clinic you have any questions or concerns. The clinic phone number is [phone].  Please show the CHEMO ALERT CARD at check-in to the Emergency Department and triage nurse.   

## 2016-09-30 ENCOUNTER — Telehealth: Payer: Self-pay | Admitting: Hematology

## 2016-09-30 NOTE — Telephone Encounter (Signed)
Left voicemail for patient regarding the addition of her flush appts.

## 2016-10-03 ENCOUNTER — Encounter: Payer: Self-pay | Admitting: Physician Assistant

## 2016-10-07 NOTE — Telephone Encounter (Signed)
Call to patient. She has completed chemo and is anticipating scheduling surgery in late November or early December.  Brief discussion of surgery time, hospital stay and recovery. Questions answered. Consult with Dr Quincy Simmonds scheduled for 10-25-16 to discuss.  Routing to provider for final review. Patient agreeable to disposition. Will close encounter.

## 2016-10-21 ENCOUNTER — Other Ambulatory Visit: Payer: Self-pay | Admitting: Internal Medicine

## 2016-10-25 ENCOUNTER — Ambulatory Visit (INDEPENDENT_AMBULATORY_CARE_PROVIDER_SITE_OTHER): Payer: BLUE CROSS/BLUE SHIELD | Admitting: Obstetrics and Gynecology

## 2016-10-25 ENCOUNTER — Encounter: Payer: Self-pay | Admitting: Obstetrics and Gynecology

## 2016-10-25 VITALS — BP 118/70 | HR 84 | Resp 16 | Wt 153.0 lb

## 2016-10-25 DIAGNOSIS — Z1509 Genetic susceptibility to other malignant neoplasm: Secondary | ICD-10-CM | POA: Diagnosis not present

## 2016-10-25 DIAGNOSIS — Z1501 Genetic susceptibility to malignant neoplasm of breast: Secondary | ICD-10-CM | POA: Diagnosis not present

## 2016-10-25 NOTE — Progress Notes (Signed)
GYNECOLOGY  VISIT   HPI: 61 y.o.   Married  Caucasian  female   G1P0010 with Patient's last menstrual period was 02/07/1998 (approximate).   here for   Discuss surgery options.  Interested to proceed this year with removal of tubes and ovaries due to BRCA1 status.  Had normal pelvic US on 04/05/16.  CA125 normal - 27 on 04/29/16.   New dx of breast cancer.  Status post bilateral mastectomy.  Has finished chemotherapy on August 24.  Seeing Dr. Burr Medico.  Still has her chemo port.  [name] is her surgeon.   GYNECOLOGIC HISTORY: Patient's last menstrual period was 02/07/1998 (approximate). Contraception:  Postmenopausal Menopausal hormone therapy:  none Last mammogram:  02-29-16 Density C/distortion Rt.breast & poss.asymmetry;Lt.Br.poss.asymmetry--Diag.MMG & Rt.Br.US revealed mass in Rt.Br.suspicious for malignancy. 03-08-16 Rt.Br.Bx revealed GRADE II INVASIVE MAMMARY CARCINOMA  Last pap smear:   04-17-15 Neg:Neg HR HPV; 11-08-11 Neg History of abnormal Pap:  Yes, Hx of colposcopy and some type of treatment to cervix in her 58s which was done at the hospital. Later in life hx of abnormal paps with Dr. Carren Rang, but only had repeat paps--no treatment.        OB History    Gravida Para Term Preterm AB Living   1       1 0   SAB TAB Ectopic Multiple Live Births                     Patient Active Problem List   Diagnosis Date Noted  . Anemia due to antineoplastic chemotherapy 06/17/2016  . Breast cancer, right ([place]) 04/12/2016  . Genetic testing 04/08/2016  . BRCA1 positive   . Family history of breast cancer   . Family history of ovarian cancer   . Family history of genetic disease carrier   . 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 breast in female, estrogen receptor positive ([place]) 03/15/2016  . Vitamin D deficiency 05/18/2015  . Hot flashes 05/18/2015  . Hypertension   . Asthma   . Allergy   . Eczema     Past Medical History:  Diagnosis Date  . Abnormal Pap smear of  cervix    --in her 20s had colpo and some type of treatment to cervix.  . Allergy   . Asthma    "all the time, throughout the yr"  . 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 Breast cancer, right breast ([place]) 02/2016  . Eczema   . Family history of breast cancer   . Family history of genetic disease carrier    paternal relatives with a BRCA mutation  . Family history of ovarian cancer   . Hypertension   . Pneumonia    2000ish  . STD (sexually transmitted disease)    HSV II    Past Surgical History:  Procedure Laterality Date  . BREAST BIOPSY Right 04/13/2016   Procedure: Evacuation RIGHT Breast  Hematoma;  Surgeon: [name], MD;  Location: [facility];  Service: General;  Laterality: Right;  . COLONOSCOPY    . DILATION AND CURETTAGE OF UTERUS  2008  . MASTECTOMY Left 04/12/2016   prophylactic total mastectomy  . MASTECTOMY COMPLETE / SIMPLE W/ SENTINEL NODE BIOPSY Right 04/12/2016   axillary sentinel LNB  . MASTECTOMY W/ SENTINEL NODE BIOPSY Bilateral 04/12/2016   Procedure: BILATERAL TOTAL MASTECTOMIES  WITH RIGHT SENTINEL LYMPH NODE BIOPSY;  Surgeon: [name], MD;  Location: [place];  Service: General;  Laterality: Bilateral;  . PORTACATH PLACEMENT Right 05/19/2016   Procedure: INSERTION PORT-A-CATH WITH [place];  Surgeon: [name], MD;  Location: [place];  Service: General;  Laterality: Right;    Current Outpatient Prescriptions  Medication Sig Dispense Refill  . acetaminophen (TYLENOL) 500 MG tablet Take 500-1,000 mg by mouth every 6 (six) hours as needed for mild pain or headache (depends on pain if takes 1-2 tablets).    Marland Kitchen acyclovir ointment (ZOVIRAX) 5 % Apply 1 application topically every 3 (three) hours. 15 g 1  . anastrozole (ARIMIDEX) 1 MG tablet Take 1 tablet (1 mg total) by mouth daily. 30 tablet 2  . aspirin EC 81 MG tablet Take 81 mg by mouth daily.    . cetirizine (ZYRTEC) 10 MG tablet Take 10 mg by mouth at bedtime.     .  Cholecalciferol (VITAMIN D) 2000 units tablet Take 2,000 Units by mouth daily.    . cloNIDine (CATAPRES) 0.2 MG tablet TAKE 1 TABLET(0.2 MG) BY MOUTH AT BEDTIME 90 tablet 0  . escitalopram (LEXAPRO) 20 MG tablet TAKE 1 TABLET(20 MG) BY MOUTH DAILY 90 tablet 0  . Flaxseed, Linseed, (FLAXSEED OIL PO) Take 1,400 mg by mouth every evening.    . fluticasone (FLONASE ALLERGY RELIEF) 50 MCG/ACT nasal spray Place 1 spray into both nostrils daily.    Marland Kitchen lidocaine-prilocaine (EMLA) cream Apply 1 application topically as needed. 30 g 3  . losartan (COZAAR) 100 MG tablet Take 1 tablet (100 mg total) by mouth daily. 30 tablet 11  . magnesium oxide (MAG-OX) 400 MG tablet Take 400 mg by mouth every other day.    . montelukast (SINGULAIR) 10 MG tablet TAKE 1 TABLET(10 MG) BY MOUTH DAILY 90 tablet 0  . Multiple Vitamins-Minerals (MULTIVITAMIN WITH MINERALS) tablet Take 1 tablet by mouth every evening.     . Omega-3 Fatty Acids ([employer] FISH OIL) 1200 MG CAPS Take 1,200 mg by mouth every evening.     . valACYclovir (VALTREX) 500 MG tablet Take 1 tablet (500 mg total) by mouth 2 (two) times daily. Take for 3 days as needed. 30 tablet 1  . venlafaxine XR (EFFEXOR-XR) 75 MG 24 hr capsule TAKE 2 CAPSULES BY MOUTH EVERY MORNING AND 1 CAPSULE EVERY EVENING 270 capsule 1  . vitamin B-12 (CYANOCOBALAMIN) 100 MCG tablet Take 100 mcg by mouth every other day.     . ondansetron (ZOFRAN) 8 MG tablet Take 1 tablet (8 mg total) by mouth 2 (two) times daily as needed. Start on the third day after chemotherapy. (Patient not taking: Reported on 08/11/2016) 30 tablet 1  . prochlorperazine (COMPAZINE) 10 MG tablet Take 1 tablet (10 mg total) by mouth every 6 (six) hours as needed (Nausea or vomiting). (Patient not taking: Reported on 09/22/2016) 30 tablet 1   No current facility-administered medications for this visit.    Facility-Administered Medications Ordered in Other Visits  Medication Dose Route Frequency Provider Last Rate Last Dose   . sodium chloride flush (NS) 0.9 % injection 10 mL  10 mL Intravenous PRN Truitt Merle, MD   10 mL at 06/30/16 0844  . sodium chloride flush (NS) 0.9 % injection 10 mL  10 mL Intravenous PRN Truitt Merle, MD   10 mL at 09/08/16 1329     ALLERGIES: Patient has no known allergies.  Family History  Problem Relation Age of Onset  . Polymyalgia rheumatica Mother   . Hypertension Father   . Heart disease Father  Dec 69  . Heart attack Father   . Prostate cancer Father        Dx 71s; deceased 21  . Breast cancer Cousin        Dx 65s; daughter of a paternal uncle  . Ovarian cancer Paternal Aunt        Dx 64; deceased  . Ovarian cancer Cousin        Dx 69s; daughter of a paternal uncle  . Ovarian cancer Cousin        Dx 25; daughter of a paternal uncle  . Ovarian cancer Paternal Aunt        Dx 86s.  BRCA positive  . Ovarian cancer Other        pat grandfather's mother    Social History   Social History  . Marital status: Married    Spouse name: N/A  . Number of children: N/A  . Years of education: N/A   Occupational History  . Not on file.   Social History Main Topics  . Smoking status: Former Smoker    Packs/day: 1.00    Years: 25.00    Quit date: 02/08/1996  . Smokeless tobacco: Never Used  . Alcohol use 13.2 oz/week    7 Glasses of wine, 15 Standard drinks or equivalent per week  . Drug use: No  . Sexual activity: No   Other Topics Concern  . Not on file   Social History Narrative  . No narrative on file    ROS:  Pertinent items are noted in HPI.  PHYSICAL EXAMINATION:    BP 118/70 (BP Location: Right Arm, Patient Position: Sitting, Cuff Size: Normal)   Pulse 84   Resp 16   Wt 153 lb (69.4 kg)   LMP 02/07/1998 (Approximate)   BMI 26.26 kg/m     General appearance: alert, cooperative and appears stated age  Chaperone was present for exam.  ASSESSMENT  BRCA1 status.   PLAN  Will proceed this year with laparoscopic BSO with collection of pelvic  washings.  I discussed risks, benefits, and alternatives.  Risks include but are not limited to bleeding, infection, damage to surrounding organs, reaction to anesthesia, hernia formation, DVT, PE, and death.  Had appt on Dec 29, 2016 for blood work at [facility]  Has physical exam on 11/22/16 with PCP.    An After Visit Summary was printed and given to the patient.  ___15___ minutes face to face time of which over 50% was spent in counseling.

## 2016-11-08 ENCOUNTER — Ambulatory Visit (HOSPITAL_BASED_OUTPATIENT_CLINIC_OR_DEPARTMENT_OTHER): Payer: BLUE CROSS/BLUE SHIELD

## 2016-11-08 DIAGNOSIS — C50911 Malignant neoplasm of unspecified site of right female breast: Secondary | ICD-10-CM

## 2016-11-08 DIAGNOSIS — C50111 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 female breast: Secondary | ICD-10-CM | POA: Diagnosis not present

## 2016-11-08 DIAGNOSIS — Z95828 Presence of other vascular implants and grafts: Secondary | ICD-10-CM | POA: Insufficient documentation

## 2016-11-08 DIAGNOSIS — Z452 Encounter for adjustment and management of vascular access device: Secondary | ICD-10-CM | POA: Diagnosis not present

## 2016-11-08 MED ORDER — HEPARIN SOD (PORK) LOCK FLUSH 100 UNIT/ML IV SOLN
500.0000 [IU] | Freq: Once | INTRAVENOUS | Status: AC
  Administered 2016-11-08: 500 [IU]
  Filled 2016-11-08: qty 5

## 2016-11-08 MED ORDER — SODIUM CHLORIDE 0.9% FLUSH
10.0000 mL | Freq: Once | INTRAVENOUS | Status: AC
  Administered 2016-11-08: 10 mL
  Filled 2016-11-08: qty 10

## 2016-11-16 DIAGNOSIS — Z452 Encounter for adjustment and management of vascular access device: Secondary | ICD-10-CM | POA: Diagnosis not present

## 2016-11-21 NOTE — Progress Notes (Signed)
Complete Physical  Assessment and Plan:  Essential hypertension - continue medications, DASH diet, exercise and monitor at home. Call if greater than 130/80.  -     CBC with Differential/Platelet -     BASIC METABOLIC PANEL WITH GFR -     Hepatic function panel -     TSH -     Urinalysis, Routine w reflex microscopic -     Microalbumin / creatinine urine ratio -     losartan (COZAAR) 100 MG tablet; Take 1 tablet (100 mg total) by mouth daily.  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 breast in female, estrogen receptor positive ([place]) Continue follow up  BRCA1 positive Continue follow up Has BSO scheduled in Dec  Anemia due to antineoplastic chemotherapy -     Iron,Total/Total Iron Binding Cap -     Vitamin B12  Vitamin D deficiency -     VITAMIN D 25 Hydroxy (Vit-D Deficiency, Fractures)  Allergic state, subsequent encounter -     montelukast (SINGULAIR) 10 MG tablet; TAKE 1 TABLET(10 MG) BY MOUTH DAILY  Uncomplicated asthma, unspecified asthma severity, unspecified whether persistent -     montelukast (SINGULAIR) 10 MG tablet; TAKE 1 TABLET(10 MG) BY MOUTH DAILY  Eczema, unspecified type -     montelukast (SINGULAIR) 10 MG tablet; TAKE 1 TABLET(10 MG) BY MOUTH DAILY  Screening cholesterol level -     Lipid panel  Medication management -     Magnesium  Routine general medical examination at a health care facility 1 year TDAP at next OV  Needs flu shot -     FLU VACCINE MDCK QUAD W/Preservative  Hot flashes -     escitalopram (LEXAPRO) 20 MG tablet; TAKE 1 TABLET(20 MG) BY MOUTH DAILY -     cloNIDine (CATAPRES) 0.2 MG tablet; TAKE 1 TABLET(0.2 MG) BY MOUTH AT BEDTIME -     venlafaxine XR (EFFEXOR-XR) 75 MG 24 hr capsule; TAKE 2 CAPSULES BY MOUTH EVERY MORNING AND 1 CAPSULE EVERY EVENING    Discussed med's effects and SE's. Screening labs and tests as requested with regular follow-up as recommended. Future Appointments Date Time Provider [place]  11/29/2016 1:00 PM [facility] LAB 2 [facility] None  11/29/2016 1:30 PM Truitt Merle, MD [facility] None  12/19/2016 1:00 PM [name], MD [place] None  12/20/2016 2:30 PM [facility] FLUSH NURSE [facility] None  01/05/2017 2:00 PM Gardenia Phlegm, NP [facility] None  01/25/2017 1:00 PM [name], MD [place] None  01/27/2017 2:30 PM [facility] FLUSH NURSE [facility] None  03/14/2017 2:30 PM [facility] FLUSH NURSE [facility] None  05/12/2017 3:45 PM Amundson Raliegh Ip, MD [place] None  11/23/2017 9:00 AM [name], PA-C [facility] None    HPI 61 y.o. female  presents for a complete physical.  Her blood pressure has been controlled at home, today their BP is BP: 122/80 She does workout, yoga 4-5 x a week.  She denies chest pain, shortness of breath, dizziness.  She was diagnosed with right breast cancer ER +, PR+ HER2 - 02/2016, following with Dr. Burr Medico, had double mastectomy with [name] 04/12/2016.  Will start adjuvant chemo with cytoxan and docetaxel for 4 weeks and taxol for 12 weeks which she has finished and she has the port removed last week. Patient is BRCA 1 + and will have BSO in Dec. Tried anastrozole but stopped after not feeling well, will follow up.  She is not on cholesterol medication  and denies myalgias. Her cholesterol is at goal. The cholesterol last visit was:   Lab Results  Component Value Date   CHOL 218 (H) 11/16/2015   HDL 115 11/16/2015   LDLCALC 70 11/16/2015   TRIG 166 (H) 11/16/2015   CHOLHDL 1.9 11/16/2015   Last A1C in the office was:  Lab Results  Component Value Date   HGBA1C 5.5 05/26/2014   Patient is on Vitamin D supplement.   Lab Results  Component Value Date   VD25OH 51 11/16/2015   She follows with Dr. Quincy Simmonds.  She has atopy with eczema, allergies, and asthma. She is on flonase for allergies, needs refill albuterol, has not been using it.  She is on lexapro and catapress for  hot flashes that help.  Wt Readings from Last 3 Encounters:  11/22/16 150 lb 3.2 oz (68.1 kg)  10/25/16 153 lb (69.4 kg)  09/22/16 156 lb (70.8 kg)    Current Medications:  Current Outpatient Prescriptions on File Prior to Visit  Medication Sig Dispense Refill  . acetaminophen (TYLENOL) 500 MG tablet Take 500-1,000 mg by mouth every 6 (six) hours as needed for mild pain or headache (depends on pain if takes 1-2 tablets).    Marland Kitchen acyclovir ointment (ZOVIRAX) 5 % Apply 1 application topically every 3 (three) hours. 15 g 1  . aspirin EC 81 MG tablet Take 81 mg by mouth daily.    . cetirizine (ZYRTEC) 10 MG tablet Take 10 mg by mouth at bedtime.     . Cholecalciferol (VITAMIN D) 2000 units tablet Take 2,000 Units by mouth daily.    . Flaxseed, Linseed, (FLAXSEED OIL PO) Take 1,400 mg by mouth every evening.    . fluticasone (FLONASE ALLERGY RELIEF) 50 MCG/ACT nasal spray Place 1 spray into both nostrils daily.    . magnesium oxide (MAG-OX) 400 MG tablet Take 400 mg by mouth every other day.    . Multiple Vitamins-Minerals (MULTIVITAMIN WITH MINERALS) tablet Take 1 tablet by mouth every evening.     . Omega-3 Fatty Acids ([employer]S FISH OIL) 1200 MG CAPS Take 1,200 mg by mouth every evening.     . valACYclovir (VALTREX) 500 MG tablet Take 1 tablet (500 mg total) by mouth 2 (two) times daily. Take for 3 days as needed. 30 tablet 1  . vitamin B-12 (CYANOCOBALAMIN) 100 MCG tablet Take 100 mcg by mouth every other day.      No current facility-administered medications on file prior to visit.    Health Maintenance:   Immunization History  Administered Date(s) Administered  . Influenza, Seasonal, Injecte, Preservative Fre 11/16/2015  . Pneumococcal Polysaccharide-23 11/13/2014  . Td 02/08/1996, 02/07/2006   Tetanus: 2008 WILL WAIT, DECLINES AT THIS TIME Pneumovax: 2016 Prevnar 13: due age 60 Flu vaccine: TODAY Zostavax: 2015 at [facility]  Pap: 04/2016, q 6 months,  Dr. Quincy Simmonds.  MGM: 02/2016 s/p  masectomy DEXA: 01/2014 normal at OB/GYN  Colonoscopy: Nov 2017 Dr. Watt Climes 10 years EGD: N/A DEE: 1 year ago Stress test 2008 normal Echo 2018  Patient Care Team: [name], MD as PCP - General (Internal Medicine) Verl Blalock, Marijo Conception, MD (Inactive) as Consulting Physician (Cardiology) [name], MD as Consulting Physician (Dermatology)   Medical History:  Past Medical History:  Diagnosis Date  . Abnormal Pap smear of cervix    --in her 20s had colpo and some type of treatment to cervix.  . Allergy   . Asthma    "all the time, throughout the yr"  .  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 Breast cancer, right breast ([place]) 02/2016  . Eczema   . Family history of breast cancer   . Family history of genetic disease carrier    paternal relatives with a BRCA mutation  . Family history of ovarian cancer   . Hypertension   . Pneumonia    2000ish  . STD (sexually transmitted disease)    HSV II   Allergies No Known Allergies  SURGICAL HISTORY She  has a past surgical history that includes Dilation and curettage of uterus (2008); Mastectomy (Left, 04/12/2016); Mastectomy complete / simple w/ sentinel node biopsy (Right, 04/12/2016); Mastectomy w/ sentinel node biopsy (Bilateral, 04/12/2016); Breast biopsy (Right, 04/13/2016); Colonoscopy; and Portacath placement (Right, 05/19/2016). FAMILY HISTORY Her family history includes Breast cancer in her cousin; Heart attack in her father; Heart disease in her father; Hypertension in her father; Ovarian cancer in her cousin, cousin, other, paternal aunt, and paternal aunt; Polymyalgia rheumatica in her mother; Prostate cancer in her father. SOCIAL HISTORY She  reports that she quit smoking about 20 years ago. She has a 25.00 pack-year smoking history. She has never used smokeless tobacco. She reports that she drinks about 13.2 oz of alcohol per week . She reports that she does not use drugs.   Review of  Systems  Constitutional: Negative.   HENT: Negative for congestion, ear discharge, ear pain, hearing loss, nosebleeds, sore throat and tinnitus.   Eyes: Negative.   Respiratory: Negative.  Negative for stridor.   Cardiovascular: Negative.   Gastrointestinal: Negative.   Genitourinary: Negative.   Musculoskeletal: Negative.   Skin: Negative for itching and rash.  Neurological: Negative.  Negative for headaches.  Endo/Heme/Allergies: Negative.   Psychiatric/Behavioral: Negative.      Physical Exam: Estimated body mass index is 24.99 kg/m as calculated from the following:   Height as of this encounter: _0  (1.651 m).   Weight as of this encounter: 150 lb 3.2 oz (68.1 kg). BP 122/80   Pulse 71   Temp 98 F (36.7 C)   Resp 14   Ht _1  (1.651 m)   Wt 150 lb 3.2 oz (68.1 kg)   LMP 02/07/1998 (Approximate)   SpO2 98%   BMI 24.99 kg/m  General Appearance: Well nourished, in no apparent distress. Eyes: PERRLA, EOMs, conjunctiva no swelling or erythema, normal fundi and vessels. Sinuses: No Frontal/maxillary tenderness ENT/Mouth: Ext aud canals clear, normal light reflex with TMs without erythema, bulging.  Good dentition. No erythema, swelling, or exudate on post pharynx. Tonsils not swollen or erythematous. Hearing normal.  Neck: Supple, thyroid normal. No bruits Respiratory: Respiratory effort normal, BS equal bilaterally without rales, rhonchi, wheezing or stridor. Cardio: RRR without murmurs, rubs or gallops. Brisk peripheral pulses without edema.  Chest: symmetric, with normal excursions and percussion. Breasts: defer Abdomen: Soft, +BS. Non tender, no guarding, rebound, hernias, masses, or organomegaly. .  Lymphatics: Non tender without lymphadenopathy.  Genitourinary: defer Musculoskeletal: Full ROM all peripheral extremities,5/5 strength, and normal gait. Skin: + dry scaly patches bilateral hands. Warm, dry without lesions, ecchymosis.  Neuro: Cranial nerves intact,  reflexes equal bilaterally. Normal muscle tone, no cerebellar symptoms. Sensation intact.  Psych: Awake and oriented X 3, normal affect, Insight and Judgment appropriate.    [name] 9:36 AM

## 2016-11-22 ENCOUNTER — Ambulatory Visit (INDEPENDENT_AMBULATORY_CARE_PROVIDER_SITE_OTHER): Payer: BLUE CROSS/BLUE SHIELD | Admitting: Physician Assistant

## 2016-11-22 ENCOUNTER — Encounter: Payer: Self-pay | Admitting: Physician Assistant

## 2016-11-22 VITALS — BP 122/80 | HR 71 | Temp 98.0°F | Resp 14 | Ht 65.0 in | Wt 150.2 lb

## 2016-11-22 DIAGNOSIS — I10 Essential (primary) hypertension: Secondary | ICD-10-CM

## 2016-11-22 DIAGNOSIS — Z170 Estrogen receptor positive status [ER+]: Secondary | ICD-10-CM

## 2016-11-22 DIAGNOSIS — J45909 Unspecified asthma, uncomplicated: Secondary | ICD-10-CM

## 2016-11-22 DIAGNOSIS — E559 Vitamin D deficiency, unspecified: Secondary | ICD-10-CM | POA: Diagnosis not present

## 2016-11-22 DIAGNOSIS — L309 Dermatitis, unspecified: Secondary | ICD-10-CM

## 2016-11-22 DIAGNOSIS — Z79899 Other long term (current) drug therapy: Secondary | ICD-10-CM | POA: Diagnosis not present

## 2016-11-22 DIAGNOSIS — Z13220 Encounter for screening for lipoid disorders: Secondary | ICD-10-CM

## 2016-11-22 DIAGNOSIS — D6481 Anemia due to antineoplastic chemotherapy: Secondary | ICD-10-CM

## 2016-11-22 DIAGNOSIS — Z1501 Genetic susceptibility to malignant neoplasm of breast: Secondary | ICD-10-CM

## 2016-11-22 DIAGNOSIS — T451X5A Adverse effect of antineoplastic and immunosuppressive drugs, initial encounter: Secondary | ICD-10-CM

## 2016-11-22 DIAGNOSIS — R232 Flushing: Secondary | ICD-10-CM

## 2016-11-22 DIAGNOSIS — C50111 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 female breast: Secondary | ICD-10-CM

## 2016-11-22 DIAGNOSIS — Z23 Encounter for immunization: Secondary | ICD-10-CM

## 2016-11-22 DIAGNOSIS — Z1509 Genetic susceptibility to other malignant neoplasm: Secondary | ICD-10-CM

## 2016-11-22 DIAGNOSIS — Z0000 Encounter for general adult medical examination without abnormal findings: Secondary | ICD-10-CM | POA: Diagnosis not present

## 2016-11-22 DIAGNOSIS — T7840XD Allergy, unspecified, subsequent encounter: Secondary | ICD-10-CM

## 2016-11-22 MED ORDER — LOSARTAN POTASSIUM 100 MG PO TABS: 100.0000 mg | ORAL_TABLET | Freq: Every day | ORAL | 1 refills | Status: DC

## 2016-11-22 MED ORDER — VENLAFAXINE HCL ER 75 MG PO CP24: ORAL_CAPSULE | ORAL | 1 refills | Status: DC

## 2016-11-22 MED ORDER — ESCITALOPRAM OXALATE 20 MG PO TABS: ORAL_TABLET | ORAL | 1 refills | Status: DC

## 2016-11-22 MED ORDER — CLONIDINE HCL 0.2 MG PO TABS: ORAL_TABLET | ORAL | 1 refills | Status: DC

## 2016-11-22 MED ORDER — MONTELUKAST SODIUM 10 MG PO TABS: ORAL_TABLET | ORAL | 1 refills | Status: DC

## 2016-11-22 NOTE — Patient Instructions (Signed)
Monitor your blood pressure at home. Go to the ER if any CP, SOB, nausea, dizziness, severe HA, changes vision/speech  Goal BP:  For patients younger than 60: Goal BP < 140/90. For patients 60 and older: Goal BP < 150/90. For patients with diabetes: Goal BP < 140/90. Your most recent BP: BP: 122/80   Take your medications faithfully as instructed. Maintain a healthy weight. Get at least 150 minutes of aerobic exercise per week. Minimize salt intake. Minimize alcohol intake  DASH Eating Plan DASH stands for "Dietary Approaches to Stop Hypertension." The DASH eating plan is a healthy eating plan that has been shown to reduce high blood pressure (hypertension). Additional health benefits may include reducing the risk of type 2 diabetes mellitus, heart disease, and stroke. The DASH eating plan may also help with weight loss. WHAT DO I NEED TO KNOW ABOUT THE DASH EATING PLAN? For the DASH eating plan, you will follow these general guidelines:  Choose foods with a percent daily value for sodium of less than 5% (as listed on the food label).  Use salt-free seasonings or herbs instead of table salt or sea salt.  Check with your health care provider or pharmacist before using salt substitutes.  Eat lower-sodium products, often labeled as "lower sodium" or "no salt added."  Eat fresh foods.  Eat more vegetables, fruits, and low-fat dairy products.  Choose whole grains. Look for the word "whole" as the first word in the ingredient list.  Choose fish and skinless chicken or [place] more often than red meat. Limit fish, poultry, and meat to 6 oz (170 g) each day.  Limit sweets, desserts, sugars, and sugary drinks.  Choose heart-healthy fats.  Limit cheese to 1 oz (28 g) per day.  Eat more home-cooked food and less restaurant, buffet, and fast food.  Limit fried foods.  Cook foods using methods other than frying.  Limit canned vegetables. If you do use them, rinse them well to  decrease the sodium.  When eating at a restaurant, ask that your food be prepared with less salt, or no salt if possible. WHAT FOODS CAN I EAT? Seek help from a dietitian for individual calorie needs. Grains Whole grain or whole wheat bread. Brown rice. Whole grain or whole wheat pasta. Quinoa, bulgur, and whole grain cereals. Low-sodium cereals. Corn or whole wheat flour tortillas. Whole grain cornbread. Whole grain crackers. Low-sodium crackers. Vegetables Fresh or frozen vegetables (raw, steamed, roasted, or grilled). Low-sodium or reduced-sodium tomato and vegetable juices. Low-sodium or reduced-sodium tomato sauce and paste. Low-sodium or reduced-sodium canned vegetables.  Fruits All fresh, canned (in natural juice), or frozen fruits. Meat and Other Protein Products Ground beef (85% or leaner), grass-fed beef, or beef trimmed of fat. Skinless chicken or [place]. Ground chicken or [place]. Pork trimmed of fat. All fish and seafood. Eggs. Dried beans, peas, or lentils. Unsalted nuts and seeds. Unsalted canned beans. Dairy Low-fat dairy products, such as skim or 1% milk, 2% or reduced-fat cheeses, low-fat ricotta or cottage cheese, or plain low-fat yogurt. Low-sodium or reduced-sodium cheeses. Fats and Oils Tub margarines without trans fats. Light or reduced-fat mayonnaise and salad dressings (reduced sodium). Avocado. Safflower, olive, or canola oils. Natural peanut or almond butter. Other Unsalted popcorn and pretzels. The items listed above may not be a complete list of recommended foods or beverages. Contact your dietitian for more options. WHAT FOODS ARE NOT RECOMMENDED? Grains White bread. White pasta. White rice. Refined cornbread. Bagels and croissants. Crackers that contain trans  fat. Vegetables Creamed or fried vegetables. Vegetables in a cheese sauce. Regular canned vegetables. Regular canned tomato sauce and paste. Regular tomato and vegetable juices. Fruits Dried fruits. Canned  fruit in light or heavy syrup. Fruit juice. Meat and Other Protein Products Fatty cuts of meat. Ribs, chicken wings, bacon, sausage, bologna, salami, chitterlings, fatback, hot dogs, bratwurst, and packaged luncheon meats. Salted nuts and seeds. Canned beans with salt. Dairy Whole or 2% milk, cream, half-and-half, and cream cheese. Whole-fat or sweetened yogurt. Full-fat cheeses or blue cheese. Nondairy creamers and whipped toppings. Processed cheese, cheese spreads, or cheese curds. Condiments Onion and garlic salt, seasoned salt, table salt, and sea salt. Canned and packaged gravies. Worcestershire sauce. Tartar sauce. Barbecue sauce. Teriyaki sauce. Soy sauce, including reduced sodium. Steak sauce. Fish sauce. Oyster sauce. Cocktail sauce. Horseradish. Ketchup and mustard. Meat flavorings and tenderizers. Bouillon cubes. Hot sauce. Tabasco sauce. Marinades. Taco seasonings. Relishes. Fats and Oils Butter, stick margarine, lard, shortening, ghee, and bacon fat. Coconut, palm kernel, or palm oils. Regular salad dressings. Other Pickles and olives. Salted popcorn and pretzels. The items listed above may not be a complete list of foods and beverages to avoid. Contact your dietitian for more information. WHERE CAN I FIND MORE INFORMATION? National Heart, Lung, and Blood Institute: travelstabloid.com Document Released: 01/13/2011 Document Revised: 06/10/2013 Document Reviewed: 11/28/2012 [facility] Patient Information 2015 [place], [place]. This information is not intended to replace advice given to you by your health care provider. Make sure you discuss any questions you have with your health care provider.

## 2016-11-23 LAB — BASIC METABOLIC PANEL WITH GFR
BUN: 12 mg/dL (ref 7–25)
CHLORIDE: 105 mmol/L (ref 98–110)
CO2: 30 mmol/L (ref 20–32)
CREATININE: 0.62 mg/dL (ref 0.50–0.99)
Calcium: 9.8 mg/dL (ref 8.6–10.4)
GFR, EST NON AFRICAN AMERICAN: 97 mL/min/{1.73_m2} (ref >=60)
GFR, Est African American: 112 mL/min/{1.73_m2} (ref >=60)
GLUCOSE: 94 mg/dL (ref 65–99)
POTASSIUM: 4.6 mmol/L (ref 3.5–5.3)
SODIUM: 143 mmol/L (ref 135–146)

## 2016-11-23 LAB — LIPID PANEL
Cholesterol: 203 mg/dL — ABNORMAL HIGH (ref <200)
HDL: 99 mg/dL (ref >50)
LDL Cholesterol (Calc): 87 mg/dL (calc)
NON-HDL CHOLESTEROL (CALC): 104 mg/dL (ref <130)
Total CHOL/HDL Ratio: 2.1 (calc) (ref <5.0)
Triglycerides: 81 mg/dL (ref <150)

## 2016-11-23 LAB — CBC WITH DIFFERENTIAL/PLATELET
BASOS PCT: 0.9 %
Basophils Absolute: 39 cells/uL (ref 0–200)
EOS ABS: 120 {cells}/uL (ref 15–500)
Eosinophils Relative: 2.8 %
HEMATOCRIT: 39.3 % (ref 35.0–45.0)
HEMOGLOBIN: 13.1 g/dL (ref 11.7–15.5)
LYMPHS ABS: 1088 {cells}/uL (ref 850–3900)
MCH: 32.8 pg (ref 27.0–33.0)
MCHC: 33.3 g/dL (ref 32.0–36.0)
MCV: 98.3 fL (ref 80.0–100.0)
MONOS PCT: 13.2 %
MPV: 10.7 fL (ref 7.5–12.5)
NEUTROS ABS: 2485 {cells}/uL (ref 1500–7800)
Neutrophils Relative %: 57.8 %
Platelets: 223 10*3/uL (ref 140–400)
RBC: 4 10*6/uL (ref 3.80–5.10)
RDW: 12.3 % (ref 11.0–15.0)
Total Lymphocyte: 25.3 %
WBC: 4.3 10*3/uL (ref 3.8–10.8)
WBCMIX: 568 {cells}/uL (ref 200–950)

## 2016-11-23 LAB — HEPATIC FUNCTION PANEL
AG Ratio: 2 (calc) (ref 1.0–2.5)
ALBUMIN MSPROF: 4.3 g/dL (ref 3.6–5.1)
ALKALINE PHOSPHATASE (APISO): 86 U/L (ref 33–130)
ALT: 15 U/L (ref 6–29)
AST: 20 U/L (ref 10–35)
BILIRUBIN DIRECT: 0.1 mg/dL (ref 0.0–0.2)
BILIRUBIN INDIRECT: 0.2 mg/dL (ref 0.2–1.2)
Globulin: 2.1 g/dL (calc) (ref 1.9–3.7)
Total Bilirubin: 0.3 mg/dL (ref 0.2–1.2)
Total Protein: 6.4 g/dL (ref 6.1–8.1)

## 2016-11-23 LAB — VITAMIN B12: VITAMIN B 12: 622 pg/mL (ref 200–1100)

## 2016-11-23 LAB — URINALYSIS, ROUTINE W REFLEX MICROSCOPIC
BILIRUBIN URINE: NEGATIVE
GLUCOSE, UA: NEGATIVE
Hgb urine dipstick: NEGATIVE
Ketones, ur: NEGATIVE
Leukocytes, UA: NEGATIVE
Nitrite: NEGATIVE
PROTEIN: NEGATIVE
Specific Gravity, Urine: 1.018 (ref 1.001–1.03)
pH: 6 (ref 5.0–8.0)

## 2016-11-23 LAB — MICROALBUMIN / CREATININE URINE RATIO
CREATININE, URINE: 91 mg/dL (ref 20–275)
MICROALB UR: 0.4 mg/dL
MICROALB/CREAT RATIO: 4 ug/mg{creat} (ref <30)

## 2016-11-23 LAB — MAGNESIUM: MAGNESIUM: 2 mg/dL (ref 1.5–2.5)

## 2016-11-23 LAB — IRON, TOTAL/TOTAL IRON BINDING CAP
%SAT: 19 % (ref 11–50)
IRON: 64 ug/dL (ref 45–160)
TIBC: 329 ug/dL (ref 250–450)

## 2016-11-23 LAB — VITAMIN D 25 HYDROXY (VIT D DEFICIENCY, FRACTURES): VIT D 25 HYDROXY: 42 ng/mL (ref 30–100)

## 2016-11-23 LAB — TSH: TSH: 1.38 m[IU]/L (ref 0.40–4.50)

## 2016-11-29 ENCOUNTER — Other Ambulatory Visit (HOSPITAL_BASED_OUTPATIENT_CLINIC_OR_DEPARTMENT_OTHER): Payer: BLUE CROSS/BLUE SHIELD

## 2016-11-29 ENCOUNTER — Telehealth: Payer: Self-pay | Admitting: Hematology

## 2016-11-29 ENCOUNTER — Ambulatory Visit (HOSPITAL_BASED_OUTPATIENT_CLINIC_OR_DEPARTMENT_OTHER): Payer: BLUE CROSS/BLUE SHIELD | Admitting: Hematology

## 2016-11-29 VITALS — BP 149/85 | HR 83 | Temp 97.7°F | Resp 18 | Ht 65.0 in | Wt 156.6 lb

## 2016-11-29 DIAGNOSIS — Z170 Estrogen receptor positive status [ER+]: Secondary | ICD-10-CM | POA: Diagnosis not present

## 2016-11-29 DIAGNOSIS — I10 Essential (primary) hypertension: Secondary | ICD-10-CM

## 2016-11-29 DIAGNOSIS — Z1509 Genetic susceptibility to other malignant neoplasm: Secondary | ICD-10-CM

## 2016-11-29 DIAGNOSIS — C50111 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 female breast: Secondary | ICD-10-CM | POA: Diagnosis not present

## 2016-11-29 DIAGNOSIS — Z1501 Genetic susceptibility to malignant neoplasm of breast: Secondary | ICD-10-CM

## 2016-11-29 DIAGNOSIS — N951 Menopausal and female climacteric states: Secondary | ICD-10-CM

## 2016-11-29 DIAGNOSIS — E2839 Other primary ovarian failure: Secondary | ICD-10-CM

## 2016-11-29 LAB — COMPREHENSIVE METABOLIC PANEL
ALBUMIN: 4.4 g/dL (ref 3.5–5.0)
ALK PHOS: 99 U/L (ref 40–150)
ALT: 19 U/L (ref 0–55)
ANION GAP: 12 meq/L — AB (ref 3–11)
AST: 23 U/L (ref 5–34)
BILIRUBIN TOTAL: 0.35 mg/dL (ref 0.20–1.20)
BUN: 13.8 mg/dL (ref 7.0–26.0)
CALCIUM: 9.9 mg/dL (ref 8.4–10.4)
CO2: 26 mEq/L (ref 22–29)
CREATININE: 0.7 mg/dL (ref 0.6–1.1)
Chloride: 103 mEq/L (ref 98–109)
EGFR: >60 mL/min/{1.73_m2} (ref >60)
Glucose: 78 mg/dl (ref 70–140)
Potassium: 4.1 mEq/L (ref 3.5–5.1)
Sodium: 141 mEq/L (ref 136–145)
Total Protein: 7.5 g/dL (ref 6.4–8.3)

## 2016-11-29 LAB — CBC WITH DIFFERENTIAL/PLATELET
BASO%: 0.6 % (ref 0.0–2.0)
Basophils Absolute: 0 10*3/uL (ref 0.0–0.1)
EOS%: 1.3 % (ref 0.0–7.0)
Eosinophils Absolute: 0.1 10*3/uL (ref 0.0–0.5)
HEMATOCRIT: 42.1 % (ref 34.8–46.6)
HEMOGLOBIN: 13.9 g/dL (ref 11.6–15.9)
LYMPH#: 1.4 10*3/uL (ref 0.9–3.3)
LYMPH%: 27.6 % (ref 14.0–49.7)
MCH: 33.1 pg (ref 25.1–34.0)
MCHC: 33 g/dL (ref 31.5–36.0)
MCV: 100.2 fL (ref 79.5–101.0)
MONO#: 0.5 10*3/uL (ref 0.1–0.9)
MONO%: 8.9 % (ref 0.0–14.0)
NEUT#: 3.1 10*3/uL (ref 1.5–6.5)
NEUT%: 61.6 % (ref 38.4–76.8)
PLATELETS: 253 10*3/uL (ref 145–400)
RBC: 4.2 10*6/uL (ref 3.70–5.45)
RDW: 12.8 % (ref 11.2–14.5)
WBC: 5.1 10*3/uL (ref 3.9–10.3)

## 2016-11-29 MED ORDER — LETROZOLE 2.5 MG PO TABS: 2.5000 mg | ORAL_TABLET | Freq: Every day | ORAL | 1 refills | Status: DC

## 2016-11-29 MED ORDER — GABAPENTIN 100 MG PO CAPS: 100.0000 mg | ORAL_CAPSULE | Freq: Every day | ORAL | 1 refills | Status: DC

## 2016-11-29 NOTE — Progress Notes (Signed)
[place]  [phone] [phone]  Clinic Follow Up Note   Patient Care Team: [name], MD as PCP - General (Internal Medicine) Verl Blalock, Marijo Conception, MD (Inactive) as Consulting Physician (Cardiology) [name], MD as Consulting Physician (Dermatology) 11/29/2016    CHIEF COMPLAINTS:  Follow up right breast cancer  Oncology History   Cancer Staging 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 breast in female, estrogen receptor positive ([place]) Staging form: Breast, AJCC 8th Edition - Clinical stage from 03/08/2016: Stage IA (cT1c, cN0, cM0, G2, ER: Positive, PR: Negative, HER2: Negative) - Signed by Truitt Merle, MD on 03/15/2016 - Pathologic stage from 04/12/2016: Stage IA (pT1c, pN0, cM0, G2, ER: Positive, PR: Negative, HER2: Negative, Oncotype DX score: 49) - Signed by Truitt Merle, MD on 05/05/2016       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 breast in female, estrogen receptor positive ([place])   03/04/2016 Mammogram    Diagnostic bilateral mammogram and right breast ultrasound showed a 1.7 cm mass in the 12:00 position of the right breast, highly suspicious for malignancy. There is a borderline abnormal right axillary lymph node, also suspicious for metastasis.       03/08/2016 Initial Diagnosis    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 breast in female, estrogen receptor positive ([place])      03/08/2016 Initial Biopsy    Right breast 12:00 core needle biopsy showed invasive ductal carcinoma, grade 2, right axillary lymph node biopsy was negative.      03/08/2016 Receptors her2    ER 20% positive, PR negative, HER-2 negative, Ki-67 20%      03/30/2016 Imaging    MR Bilateral Breast 03/30/2016 IMPRESSION: Known unifocal right breast malignancy. No MRI evidence of malignancy in the left breast.      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  Genes Analyzed: 43 genes on  [facility]'s Common Cancers panel (APC, ATM, AXIN2, BARD1, BMPR1A, BRCA1, BRCA2, BRIP1, CDH1, CDKN2A, CHEK2, DICER1, EPCAM, GREM1, HOXB13, KIT, MEN1, MLH1, MSH2, MSH6, MUTYH, NBN, NF1, PALB2, PDGFRA, PMS2, POLD1, POLE, PTEN, RAD50, RAD51C, RAD51D, SDHA, SDHB, SDHC, SDHD, SMAD4, SMARCA4, STK11, TP53, TSC1, TSC2, VHL).       04/12/2016 Surgery    Bilateral mastectomy and right sentinel lymph node biopsy, by [name]      04/12/2016 Pathology Results    Right breast mastectomy showed invasive grade 2 invasive ductal carcinoma, 1.7 cm, margins were negative, 3 sentinel lymph nodes were negative, left simple mastectomy fibroadenoma, one benign node, no atypia or tumor.       05/04/2016 Oncotype testing    Recurrence score 49, high-risk, predicts 10 year risk of distant recurrence 32% with tamoxifen alone.      05/10/2016 Echocardiogram    Echo on 05/10/16 showed EF 55-60%.      05/20/2016 - 09/29/2016 Chemotherapy    dose dense Adriamycin and Cytoxan, every 2 weeks starting 05/20/16 for 4 cycles, followed by Taxol starting 07/14/16 (weekly for 12 weeks or biweekly for 4 cycles) ending 09/30/16. Weekly Taxol Changed to Abaraxane 14m/m2 on 09/01/17 due to skin rash.        Breast cancer, right ([facility]   04/12/2016 Initial Diagnosis    Breast cancer, right ([facility]      HISTORY OF PRESENTING ILLNESS (03/16/16):  SBRIAHNNA HARRIES61y.o. female is here because of recently diagnosed right breast invasive mammary carcinoma. She is accompanied by her husband to our multidisciplinary  breast clinic today.  The patient underwent routine screening mammogram on 02/29/2016 and was found to have possible architectural distortion and asymmetry in the right breast. Also found was asymmetry in the left breast.  Accordingly a bilateral diagnostic mammogram and unilateral right breast ultrasound were performed on 03/04/2016. These scans showed a 1.7 cm mass in the 12 o'clock position of the right breast with imaging features  highly suspicious for malignancy. Also seen was borderline abnormal right axillary lymph node, suspicious for a metastatic node.  Biopsy of the right breast 12:00 o'clock position on 03/08/2016 revealed invasive mammary carcinoma. The carcinoma appears to be at least grade 2, ER positive, PR negative, HER-2 negative, Ki67 20%. The malignant cells are positive for E-Cadherin, supporting a ductal phenotype. Biopsy of the right axillary lymph node showed no evidence of carcinoma.   The patient reports to the clinic today for follow up and to discuss chemotherapy. She has had surgery about 3 weeks ago and she has been doing well. She started antibiotics yesterday to help with infection that she has recently gotten. She has also started doing PT this week. She decided not to do reconstruction. She has been eating well.    GYN HISTORY  Menarchal:12 LMP: late 40's (hot flushes since mid 40's)  Contraceptive: 23 HRT: 3-4 years  G0P0:  CURRENT THERAPY: Anastrozole started in 10/2016 and stopped after 2 weeks due to side effects. Will switch to Letrozole 11/30/2016  INTERVAL HISTORY:  EULIA HATCHER returns for follow up. She overall is doing very well. In the interim she stopped taking Anastrozole. She states that it caused her several very severe symptoms, including diarrhea, frequent hot flashes, and night sweats. She was taking Effexor and Lexapro with this to try and help her; however, these did not help. She reports that the hot flashes were the worst of her symptoms. These all have improved since she has stopped taking Anastrozole. She states that both her Lexapro and her Effexor were both prescribed by her PCP to help remedy hot flashes from prior to her taking Anastrozole. She continues to have night flashes, but with less severity and less frequently. She denies any neuropathy, or any other associated symptoms.   MEDICAL HISTORY:  Past Medical History:  Diagnosis Date  . Abnormal Pap smear of  cervix    --in her 20s had colpo and some type of treatment to cervix.  . Allergy   . Asthma    "all the time, throughout the yr"  . 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 Breast cancer, right breast ([place]) 02/2016  . Eczema   . Family history of breast cancer   . Family history of genetic disease carrier    paternal relatives with a BRCA mutation  . Family history of ovarian cancer   . Hypertension   . Pneumonia    2000ish  . STD (sexually transmitted disease)    HSV II    SURGICAL HISTORY: Past Surgical History:  Procedure Laterality Date  . BREAST BIOPSY Right 04/13/2016   Procedure: Evacuation RIGHT Breast  Hematoma;  Surgeon: [name], MD;  Location: [facility];  Service: General;  Laterality: Right;  . COLONOSCOPY    . DILATION AND CURETTAGE OF UTERUS  2008  . MASTECTOMY Left 04/12/2016   prophylactic total mastectomy  . MASTECTOMY COMPLETE / SIMPLE W/ SENTINEL NODE BIOPSY Right 04/12/2016   axillary sentinel LNB  . MASTECTOMY W/ SENTINEL NODE BIOPSY Bilateral 04/12/2016  Procedure: BILATERAL TOTAL MASTECTOMIES  WITH RIGHT SENTINEL LYMPH NODE BIOPSY;  Surgeon: [name], MD;  Location: [facility];  Service: General;  Laterality: Bilateral;  . PORTACATH PLACEMENT Right 05/19/2016   Procedure: INSERTION PORT-A-CATH WITH [place];  Surgeon: [name], MD;  Location: [facility];  Service: General;  Laterality: Right;    SOCIAL HISTORY: Social History   Social History  . Marital status: Married    Spouse name: N/A  . Number of children: N/A  . Years of education: N/A   Occupational History  . Not on file.   Social History Main Topics  . Smoking status: Former Smoker    Packs/day: 1.00    Years: 25.00    Quit date: 02/08/1996  . Smokeless tobacco: Never Used  . Alcohol use 13.2 oz/week    7 Glasses of wine, 15 Standard drinks or equivalent per week  . Drug use: No  . Sexual activity: No   Other Topics Concern  . Not on file    Social History Narrative  . No narrative on file    FAMILY HISTORY: Family History  Problem Relation Age of Onset  . Polymyalgia rheumatica Mother   . Hypertension Father   . Heart disease Father        Dec 69  . Heart attack Father   . Prostate cancer Father        Dx 25s; deceased 62  . Breast cancer Cousin        Dx 95s; daughter of a paternal uncle  . Ovarian cancer Paternal Aunt        Dx 11; deceased  . Ovarian cancer Cousin        Dx 22s; daughter of a paternal uncle  . Ovarian cancer Cousin        Dx 75; daughter of a paternal uncle  . Ovarian cancer Paternal Aunt        Dx 64s.  BRCA positive  . Ovarian cancer Other        pat grandfather's mother    ALLERGIES:  has No Known Allergies.  MEDICATIONS:  Current Outpatient Prescriptions  Medication Sig Dispense Refill  . acetaminophen (TYLENOL) 500 MG tablet Take 500-1,000 mg by mouth every 6 (six) hours as needed for mild pain or headache (depends on pain if takes 1-2 tablets).    Marland Kitchen acyclovir ointment (ZOVIRAX) 5 % Apply 1 application topically every 3 (three) hours. 15 g 1  . aspirin EC 81 MG tablet Take 81 mg by mouth daily.    . cetirizine (ZYRTEC) 10 MG tablet Take 10 mg by mouth at bedtime.     . Cholecalciferol (VITAMIN D) 2000 units tablet Take 2,000 Units by mouth daily.    . cloNIDine (CATAPRES) 0.2 MG tablet TAKE 1 TABLET(0.2 MG) BY MOUTH AT BEDTIME 90 tablet 1  . escitalopram (LEXAPRO) 20 MG tablet TAKE 1 TABLET(20 MG) BY MOUTH DAILY 90 tablet 1  . Flaxseed, Linseed, (FLAXSEED OIL PO) Take 1,400 mg by mouth every evening.    . fluticasone (FLONASE ALLERGY RELIEF) 50 MCG/ACT nasal spray Place 1 spray into both nostrils daily.    Marland Kitchen losartan (COZAAR) 100 MG tablet Take 1 tablet (100 mg total) by mouth daily. 90 tablet 1  . magnesium oxide (MAG-OX) 400 MG tablet Take 400 mg by mouth every other day.    . montelukast (SINGULAIR) 10 MG tablet TAKE 1 TABLET(10 MG) BY MOUTH DAILY 90 tablet 1  . Multiple  Vitamins-Minerals (MULTIVITAMIN WITH MINERALS) tablet  Take 1 tablet by mouth every evening.     . Omega-3 Fatty Acids ([employer] FISH OIL) 1200 MG CAPS Take 1,200 mg by mouth every evening.     . valACYclovir (VALTREX) 500 MG tablet Take 1 tablet (500 mg total) by mouth 2 (two) times daily. Take for 3 days as needed. 30 tablet 1  . venlafaxine XR (EFFEXOR-XR) 75 MG 24 hr capsule TAKE 2 CAPSULES BY MOUTH EVERY MORNING AND 1 CAPSULE EVERY EVENING 270 capsule 1  . vitamin B-12 (CYANOCOBALAMIN) 100 MCG tablet Take 100 mcg by mouth every other day.      No current facility-administered medications for this visit.     REVIEW OF SYSTEMS:  Constitutional: Denies fevers, chills or abnormal night sweats  (+) fatigue, improving  Eyes: Denies blurriness of vision, double vision or watery eyes Ears, nose, mouth, throat, and face: Denies mucositis or sore throat Respiratory: Denies cough, dyspnea or wheezes Cardiovascular: Denies palpitation, chest discomfort or lower extremity swelling Gastrointestinal:  Denies heartburn  Skin:  (+) darkening of nails  Lymphatics: Denies new lymphadenopathy or easy bruising Neurological:Denies numbness, tingling or new weaknesses BREAST: (+) soreness and numbness near incision site  Behavioral/Psych: Mood is stable, no new changes  All other systems were reviewed with the patient and are negative.  PHYSICAL EXAMINATION: ECOG PERFORMANCE STATUS: 1  Vitals:   11/29/16 1328  BP: (!) 149/85  Pulse: 83  Resp: 18  Temp: 97.7 F (36.5 C)  TempSrc: Oral  SpO2: 100%  Weight: 156 lb 9.6 oz (71 kg)  Height: 5' 5"  (1.651 m)     GENERAL:alert, no distress and comfortable SKIN: skin color, texture, turgor are normal, no rashes or significant lesions, Dryness pof skin and color changes to nail nail bed. EYES: normal, conjunctiva are pink and non-injected, sclera clear OROPHARYNX:no exudate, no erythema and lips, buccal mucosa, and tongue normal, no oral thrush NECK: supple,  thyroid normal size, non-tender, without nodularity LYMPH:  no palpable lymphadenopathy in the cervical, axillary or inguinal LUNGS: clear to auscultation and percussion with normal breathing effort HEART: regular rate & rhythm and no murmurs and no lower extremity edema ABDOMEN:abdomen soft, non-tender and normal bowel sounds Musculoskeletal:no cyanosis of digits and no clubbing  PSYCH: alert & oriented x 3 with fluent speech NEURO: no focal motor/sensory deficits Breasts: Breast inspection showed Status post bilateral mastectomy, surgical incisions have healed well with no discharge or surrounding skin Erythema or nodularity . Soft tissue fullness in left axilla   LABORATORY DATA:  I have reviewed the data as listed CBC Latest Ref Rng & Units 11/29/2016 11/22/2016 09/29/2016  WBC 3.9 - 10.3 10e3/uL 5.1 4.3 4.5  Hemoglobin 11.6 - 15.9 g/dL 13.9 13.1 11.1(L)  Hematocrit 34.8 - 46.6 % 42.1 39.3 33.2(L)  Platelets 145 - 400 10e3/uL 253 223 284    CMP Latest Ref Rng & Units 11/22/2016 09/29/2016 09/22/2016  Glucose 65 - 99 mg/dL 94 102 112  BUN 7 - 25 mg/dL 12 12.7 11.1  Creatinine 0.50 - 0.99 mg/dL 0.62 0.7 0.7  Sodium 135 - 146 mmol/L 143 139 141  Potassium 3.5 - 5.3 mmol/L 4.6 4.3 4.1  Chloride 98 - 110 mmol/L 105 - -  CO2 20 - 32 mmol/L 30 27 26   Calcium 8.6 - 10.4 mg/dL 9.8 9.7 9.5  Total Protein 6.1 - 8.1 g/dL 6.4 6.7 6.3(L)  Total Bilirubin 0.2 - 1.2 mg/dL 0.3 0.27 0.28  Alkaline Phos 40 - 150 U/L - 100 95  AST 10 -  35 U/L 20 21 18   ALT 6 - 29 U/L 15 14 13     PATHOLOGY  Diagnosis 04/12/16 1. Breast, simple mastectomy, Left Prophylactic Total - BREAST PARENCHYMA SHOWING FIBROCYSTIC CHANGES WITH APOCRINE METAPLASIA AND FLORID USUAL DUCTAL HYPERPLASIA. - FIBROADENOMA. - ONE BENIGN LYMPH NODE (0/1). - NO ATYPIA OR TUMOR SEEN. - GROSSLY UNREMARKABLE SKIN. 2. Breast, simple mastectomy, Right Total - INVASIVE, GRADE 2 DUCTAL CARCINOMA, SPANNING 1.7 CM IN GREATEST DIMENSION. -  MARGINS ARE NEGATIVE. - GROSSLY UNREMARKABLE SKIN. - SEE ONCOLOGY TEMPLATE. 3. Lymph node, sentinel, biopsy, Right Axillary #1 - ONE BENIGN WITH NO TUMOR SEEN (0/1). - SEE COMMENT. 4. Lymph node, sentinel, biopsy, Right - ONE BENIGN WITH NO TUMOR SEEN (0/1). - SEE COMMENT. 5. Lymph node, sentinel, biopsy, Right - ONE BENIGN WITH NO TUMOR SEEN (0/1). - SEE COMMENT.  Diagnosis 03/08/2016 1. Breast, right, needle core biopsy, 12:00 o'clock - INVASIVE MAMMARY CARCINOMA. - SEE COMMENT. 2. Lymph node, needle/core biopsy, right axilla - THERE IS NO EVIDENCE OF CARCINOMA IN 1 OF 1 LYMPH NODE (0/1).   PROCEDURES  ECHO 05/10/16 Study Conclusions - Left ventricle: The cavity size was normal. Systolic function was   normal. The estimated ejection fraction was in the range of 55%   to 60%. Wall motion was normal; there were no regional wall   motion abnormalities. Doppler parameters are consistent with   abnormal left ventricular relaxation (grade 1 diastolic   dysfunction). There was no evidence of elevated ventricular   filling pressure by Doppler parameters. - Aortic valve: Trileaflet; normal thickness leaflets. There was no   regurgitation. - Aortic root: The aortic root was normal in size. - Mitral valve: Structurally normal valve. There was no   regurgitation. - Right ventricle: The cavity size was normal. Wall thickness was   normal. Systolic function was normal. - Tricuspid valve: There was trivial regurgitation. - Pulmonary arteries: Systolic pressure was within the normal   range. - Inferior vena cava: The vessel was normal in size. - Pericardium, extracardiac: There was no pericardial effusion.   RADIOGRAPHIC STUDIES: I have personally reviewed the radiological images as listed and agreed with the findings in the report. No results found.   MR Bilateral Breast 03/30/2016 IMPRESSION: Known unifocal right breast malignancy. No MRI evidence of malignancy in the left  breast.  Bilateral mammogram and [place] 03/04/16 IMPRESSION: 1. 1.7 cm mass in the 12 o'clock position of the right breast with imaging features highly suspicious for malignancy. 2. Borderline abnormal right axillary lymph node, suspicious for a metastatic node.  Screening mammogram 02/29/16 IMPRESSION: Further evaluation is suggested for possible architectural distortion and asymmetry in the right breast.  ASSESSMENT & PLAN:  61 y.o. postmenopausal woman, presented with screening discovered right breast cancer  1. 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 Oncotype RS 49, high risk  - Patient has had bilateral total mastectomy on 04/12/16 and denied reconstruction.  -I previously reviewed her surgical pathology findings with patient and her husband in details. Her surgical margins were negative, 3 sentinel lymph nodes were also negative. - Her oncotype is back at 49. This is fairly high risk disease based on the RS. the estimated 10 year risk of distant recurrence with tamoxifen alone is 32%. I previously, strongly recommend chemotherapy to reduce her risk of cancer recurrence after surgery (by ~50%). -she has completed adjuvant chemotherapy with dose dense Adriamycin and Cytoxan  followed by Taxol/abraxane  - Baseline Echo on 05/10/16 showed EF 55-60%. -She was started on Anastrozole, however, this caused her severe hot flashes, night sweats, and diarrhea, so she stopped taking this medication. I informed her that we can switch her to Letrozole, and add Neurontin for hot flushes. I will start her Neurontin at 130m and she can gradually increase as needed.  -The potential benefit and side effects from letrozole, which includes but not limited to, hot flash, skin and vaginal dryness, metabolic changes (increased blood glucose, cholesterol, weight, etc.), slightly in increased risk of cardiovascular  disease, cataracts, muscular and joint discomfort, osteopenia and osteoporosis, etc, were discussed with her in great details. She is interested, and agrees to start asap. -If she does have severe side effects from Letrozole, I'll be okay to stop due to her weak ER positivity and small benefit. -We discussed breast cancer surveillance after she completes treatment, Including annual mammogram, breast exam every 6-12 months. -F/u in 2 months   2. BRCA1 mutation (+) -Due to her strong family history of ovarian cancer and also positive family history of breast cancer, she underwent genetic testing - Patient is positive for BRCA1 Mutation -We previously discussed the high risk of breast cancer and colon cancer due to the BRCA1 mutation. She is status post bilateral mastectomy. She is agreeable to have BSO, she has discussed with her gynecologist. I have recommended her to have this surgery done after she completes adjuvant chemotherapy. - Her sister has done genetic testing, results pending -She has no children.  3. Anemia secondary to chemo -resolved now   4. Bone Health -Last DEXA was 2+ years ago -Due to antiestrogen medication I suggest she use calcium/vit D Supplements  -Due for DEXA before next visit, we will order this for her  Plan:  -Labs reviewed, stable.  -Ordered Letrozole today, she will begin this in the next few weeks.  -Labs and f/u in 8 weeks with DEXA prior   All questions were answered. The patient knows to call the clinic with any problems, questions or concerns. I spent 20 minutes counseling the patient face to face. The total time spent in the appointment was 25  minutes and more than 50% was on counseling.  This document serves as a record of services personally performed by YTruitt Merle MD. It was created on her behalf by WReola Mosher a trained medical scribe. The creation of this record is based on the scribe's personal observations and the provider's statements  to them. This document has been checked and approved by the attending provider.   FTruitt Merle 11/29/16

## 2016-11-29 NOTE — Telephone Encounter (Signed)
Scheduled appt per 10/23 los - patient did want AVS or calender printed - my chart active - patient aware of appt date and time.

## 2016-11-30 ENCOUNTER — Encounter: Payer: Self-pay | Admitting: Hematology

## 2016-12-16 NOTE — Progress Notes (Deleted)
GYNECOLOGY  VISIT   HPI: 61 y.o.   Married  Caucasian  female   G1P0010 with Patient's last menstrual period was 02/07/1998 (approximate).   here for surgical consult.   GYNECOLOGIC HISTORY: Patient's last menstrual period was 02/07/1998 (approximate). Contraception:  Postmenopausal Menopausal hormone therapy:  none Last mammogram: -22-18 Density C/distortion Rt.breast & poss.asymmetry;Lt.Br.poss.asymmetry--Diag.MMG & Rt.Br.US revealed mass in Rt.Br.suspicious for malignancy. 03-08-16 Rt.Br.Bx revealed GRADE II INVASIVE MAMMARY CARCINOMA  Last pap smear:  04-17-15 Neg:Neg HR HPV; 11-08-11 Neg        OB History    Gravida Para Term Preterm AB Living   1       1 0   SAB TAB Ectopic Multiple Live Births                     Patient Active Problem List   Diagnosis Date Noted  . Port-A-Cath in place 11/08/2016  . Anemia due to antineoplastic chemotherapy 06/17/2016  . Breast cancer, right ([place]) 04/12/2016  . Genetic testing 04/08/2016  . BRCA1 positive   . Family history of breast cancer   . Family history of ovarian cancer   . Family history of genetic disease carrier   . 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 breast in female, estrogen receptor positive ([place]) 03/15/2016  . Vitamin D deficiency 05/18/2015  . Hot flashes 05/18/2015  . Hypertension   . Asthma   . Allergy   . Eczema     Past Medical History:  Diagnosis Date  . Abnormal Pap smear of cervix    --in her 20s had colpo and some type of treatment to cervix.  . Allergy   . Asthma    "all the time, throughout the yr"  . 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 Breast cancer, right breast ([place]) 02/2016  . Eczema   . Family history of breast cancer   . Family history of genetic disease carrier    paternal relatives with a BRCA mutation  . Family history of ovarian cancer   . Hypertension   . Pneumonia    2000ish  . STD (sexually transmitted disease)    HSV II    Past  Surgical History:  Procedure Laterality Date  . COLONOSCOPY    . DILATION AND CURETTAGE OF UTERUS  2008  . MASTECTOMY Left 04/12/2016   prophylactic total mastectomy  . MASTECTOMY COMPLETE / SIMPLE W/ SENTINEL NODE BIOPSY Right 04/12/2016   axillary sentinel LNB    Current Outpatient Medications  Medication Sig Dispense Refill  . acetaminophen (TYLENOL) 500 MG tablet Take 500-1,000 mg by mouth every 6 (six) hours as needed for mild pain or headache (depends on pain if takes 1-2 tablets).    Marland Kitchen acyclovir ointment (ZOVIRAX) 5 % Apply 1 application topically every 3 (three) hours. 15 g 1  . aspirin EC 81 MG tablet Take 81 mg by mouth daily.    . cetirizine (ZYRTEC) 10 MG tablet Take 10 mg by mouth at bedtime.     . Cholecalciferol (VITAMIN D) 2000 units tablet Take 2,000 Units by mouth daily.    . cloNIDine (CATAPRES) 0.2 MG tablet TAKE 1 TABLET(0.2 MG) BY MOUTH AT BEDTIME 90 tablet 1  . escitalopram (LEXAPRO) 20 MG tablet TAKE 1 TABLET(20 MG) BY MOUTH DAILY 90 tablet 1  . Flaxseed, Linseed, (FLAXSEED OIL PO) Take 1,400 mg by mouth every evening.    . fluticasone (FLONASE ALLERGY RELIEF) 50 MCG/ACT nasal  spray Place 1 spray into both nostrils daily.    Marland Kitchen gabapentin (NEURONTIN) 100 MG capsule Take 1-3 capsules (100-300 mg total) by mouth at bedtime. 90 capsule 1  . letrozole (FEMARA) 2.5 MG tablet Take 1 tablet (2.5 mg total) by mouth daily. 30 tablet 1  . losartan (COZAAR) 100 MG tablet Take 1 tablet (100 mg total) by mouth daily. 90 tablet 1  . magnesium oxide (MAG-OX) 400 MG tablet Take 400 mg by mouth every other day.    . montelukast (SINGULAIR) 10 MG tablet TAKE 1 TABLET(10 MG) BY MOUTH DAILY 90 tablet 1  . Multiple Vitamins-Minerals (MULTIVITAMIN WITH MINERALS) tablet Take 1 tablet by mouth every evening.     . Omega-3 Fatty Acids ([employer] FISH OIL) 1200 MG CAPS Take 1,200 mg by mouth every evening.     . valACYclovir (VALTREX) 500 MG tablet Take 1 tablet (500 mg total) by mouth 2 (two)  times daily. Take for 3 days as needed. 30 tablet 1  . venlafaxine XR (EFFEXOR-XR) 75 MG 24 hr capsule TAKE 2 CAPSULES BY MOUTH EVERY MORNING AND 1 CAPSULE EVERY EVENING 270 capsule 1  . vitamin B-12 (CYANOCOBALAMIN) 100 MCG tablet Take 100 mcg by mouth every other day.      No current facility-administered medications for this visit.      ALLERGIES: Patient has no known allergies.  Family History  Problem Relation Age of Onset  . Polymyalgia rheumatica Mother   . Hypertension Father   . Heart disease Father        Dec 69  . Heart attack Father   . Prostate cancer Father        Dx 70s; deceased 42  . Breast cancer Cousin        Dx 19s; daughter of a paternal uncle  . Ovarian cancer Paternal Aunt        Dx 74; deceased  . Ovarian cancer Cousin        Dx 79s; daughter of a paternal uncle  . Ovarian cancer Cousin        Dx 4; daughter of a paternal uncle  . Ovarian cancer Paternal Aunt        Dx 74s.  BRCA positive  . Ovarian cancer Other        pat grandfather's mother    Social History   Socioeconomic History  . Marital status: Married    Spouse name: Not on file  . Number of children: Not on file  . Years of education: Not on file  . Highest education level: Not on file  Social Needs  . Financial resource strain: Not on file  . Food insecurity - worry: Not on file  . Food insecurity - inability: Not on file  . Transportation needs - medical: Not on file  . Transportation needs - non-medical: Not on file  Occupational History  . Not on file  Tobacco Use  . Smoking status: Former Smoker    Packs/day: 1.00    Years: 25.00    Pack years: 25.00    Last attempt to quit: 02/08/1996    Years since quitting: 20.8  . Smokeless tobacco: Never Used  Substance and Sexual Activity  . Alcohol use: Yes    Alcohol/week: 13.2 oz    Types: 7 Glasses of wine, 15 Standard drinks or equivalent per week  . Drug use: No  . Sexual activity: No    Partners: Male    Birth  control/protection: Post-menopausal  Other Topics  Concern  . Not on file  Social History Narrative  . Not on file    ROS:  Pertinent items are noted in HPI.  PHYSICAL EXAMINATION:    LMP 02/07/1998 (Approximate)     General appearance: alert, cooperative and appears stated age Head: Normocephalic, without obvious abnormality, atraumatic Neck: no adenopathy, supple, symmetrical, trachea midline and thyroid normal to inspection and palpation Lungs: clear to auscultation bilaterally Breasts: normal appearance, no masses or tenderness, No nipple retraction or dimpling, No nipple discharge or bleeding, No axillary or supraclavicular adenopathy Heart: regular rate and rhythm Abdomen: soft, non-tender, no masses,  no organomegaly Extremities: extremities normal, atraumatic, no cyanosis or edema Skin: Skin color, texture, turgor normal. No rashes or lesions Lymph nodes: Cervical, supraclavicular, and axillary nodes normal. No abnormal inguinal nodes palpated Neurologic: Grossly normal  Pelvic: External genitalia:  no lesions              Urethra:  normal appearing urethra with no masses, tenderness or lesions              Bartholins and Skenes: normal                 Vagina: normal appearing vagina with normal color and discharge, no lesions              Cervix: no lesions                Bimanual Exam:  Uterus:  normal size, contour, position, consistency, mobility, non-tender              Adnexa: no mass, fullness, tenderness              Rectal exam: {yes no:[id number]}.  Confirms.              Anus:  normal sphincter tone, no lesions  Chaperone was present for exam.  ASSESSMENT     PLAN     An After Visit Summary was printed and given to the patient.  ______ minutes face to face time of which over 50% was spent in counseling.

## 2016-12-19 ENCOUNTER — Ambulatory Visit: Payer: BLUE CROSS/BLUE SHIELD | Admitting: Obstetrics and Gynecology

## 2016-12-20 ENCOUNTER — Other Ambulatory Visit: Payer: Self-pay

## 2016-12-20 ENCOUNTER — Encounter: Payer: Self-pay | Admitting: Obstetrics and Gynecology

## 2016-12-20 ENCOUNTER — Ambulatory Visit (INDEPENDENT_AMBULATORY_CARE_PROVIDER_SITE_OTHER): Payer: BLUE CROSS/BLUE SHIELD | Admitting: Obstetrics and Gynecology

## 2016-12-20 VITALS — BP 120/80 | HR 80 | Resp 16 | Wt 153.0 lb

## 2016-12-20 DIAGNOSIS — Z1509 Genetic susceptibility to other malignant neoplasm: Secondary | ICD-10-CM | POA: Diagnosis not present

## 2016-12-20 DIAGNOSIS — Z1501 Genetic susceptibility to malignant neoplasm of breast: Secondary | ICD-10-CM

## 2016-12-20 DIAGNOSIS — Z1502 Genetic susceptibility to malignant neoplasm of ovary: Secondary | ICD-10-CM | POA: Diagnosis not present

## 2016-12-20 NOTE — Progress Notes (Signed)
GYNECOLOGY  VISIT   HPI: 61 y.o.   Married  Caucasian  female   G1P0010 with Patient's last menstrual period was 02/07/1998 (approximate).   here for surgical consultation.   BRCA positive.  Hx breast cancer and is status post mastectomy.  She wants her tubes and ovaries removed.   Normal pelvic US on 04/05/16. No vaginal bleeding.  CA125 27 on 04/29/16.   Took Anastrazole and had a lot of hot flashes.  Now on Letrozole and doing better.  Had annual physical and had normal blood work.  Had recent visit with oncologist as well.   GYNECOLOGIC HISTORY: Patient's last menstrual period was 02/07/1998 (approximate). Contraception:  Postmenopausal Menopausal hormone therapy:  none Last mammogram:  02-29-16 Density C/distortion Rt.breast &poss.asymmetry;Lt.Br.poss.asymmetry--Diag.MMG &Rt.Br.US revealed mass in Rt.Br.suspicious for malignancy. 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 Port-A-Cath in place 11/08/2016  . Anemia due to antineoplastic chemotherapy 06/17/2016  . Breast cancer, right ([place]) 04/12/2016  . Genetic testing 04/08/2016  . BRCA1 positive   . Family history of breast cancer   . Family history of ovarian cancer   . Family history of genetic disease carrier   . 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 breast in female, estrogen receptor positive ([place]) 03/15/2016  . Vitamin D deficiency 05/18/2015  . Hot flashes 05/18/2015  . Hypertension   . Asthma   . Allergy   . Eczema     Past Medical History:  Diagnosis Date  . Abnormal Pap smear of cervix    --in her 20s had colpo and some type of treatment to cervix.  . Allergy   . Asthma    "all  the time, throughout the yr"  . 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 Breast cancer, right breast ([place]) 02/2016  . Eczema   . Family history of breast cancer   . Family history of genetic disease carrier    paternal relatives with a BRCA mutation  . Family history of ovarian cancer   . Hypertension   . Pneumonia    2000ish  . STD (sexually transmitted disease)    HSV II    Past Surgical History:  Procedure Laterality Date  . COLONOSCOPY    . DILATION AND CURETTAGE OF UTERUS  2008  . MASTECTOMY Left 04/12/2016   prophylactic total mastectomy  . MASTECTOMY COMPLETE / SIMPLE W/ SENTINEL NODE BIOPSY Right 04/12/2016   axillary sentinel LNB    Current Outpatient Medications  Medication Sig Dispense Refill  . acetaminophen (TYLENOL) 500 MG tablet Take 500-1,000 mg by mouth every 6 (six) hours as needed for mild pain or headache (depends on pain if takes 1-2 tablets).    Marland Kitchen acyclovir ointment (ZOVIRAX) 5 % Apply 1 application topically every 3 (three) hours. 15 g 1  . aspirin EC 81 MG tablet Take 81 mg by mouth daily.    Marland Kitchen  CALCIUM PO Take daily by mouth.    . cetirizine (ZYRTEC) 10 MG tablet Take 10 mg by mouth at bedtime.     . Cholecalciferol (VITAMIN D) 2000 units tablet Take 2,000 Units by mouth daily.    . cloNIDine (CATAPRES) 0.2 MG tablet TAKE 1 TABLET(0.2 MG) BY MOUTH AT BEDTIME 90 tablet 1  . escitalopram (LEXAPRO) 20 MG tablet TAKE 1 TABLET(20 MG) BY MOUTH DAILY 90 tablet 1  . Flaxseed, Linseed, (FLAXSEED OIL PO) Take 1,400 mg by mouth every evening.    . fluticasone (FLONASE ALLERGY RELIEF) 50 MCG/ACT nasal spray Place 1 spray into both nostrils daily.    Marland Kitchen gabapentin (NEURONTIN) 100 MG capsule Take 1-3 capsules (100-300 mg total) by mouth at bedtime. 90 capsule 1  . letrozole (FEMARA) 2.5 MG tablet Take 1 tablet (2.5 mg total) by mouth daily. 30 tablet 1  . losartan (COZAAR) 100 MG tablet Take 1 tablet (100 mg total) by mouth daily. 90 tablet  1  . magnesium oxide (MAG-OX) 400 MG tablet Take 400 mg by mouth every other day.    . montelukast (SINGULAIR) 10 MG tablet TAKE 1 TABLET(10 MG) BY MOUTH DAILY 90 tablet 1  . Multiple Vitamins-Minerals (MULTIVITAMIN WITH MINERALS) tablet Take 1 tablet by mouth every evening.     . Omega-3 Fatty Acids ([employer] FISH OIL) 1200 MG CAPS Take 1,200 mg by mouth every evening.     . valACYclovir (VALTREX) 500 MG tablet Take 1 tablet (500 mg total) by mouth 2 (two) times daily. Take for 3 days as needed. 30 tablet 1  . venlafaxine XR (EFFEXOR-XR) 75 MG 24 hr capsule TAKE 2 CAPSULES BY MOUTH EVERY MORNING AND 1 CAPSULE EVERY EVENING 270 capsule 1  . vitamin B-12 (CYANOCOBALAMIN) 100 MCG tablet Take 100 mcg by mouth every other day.      No current facility-administered medications for this visit.      ALLERGIES: Patient has no known allergies.  Family History  Problem Relation Age of Onset  . Polymyalgia rheumatica Mother   . Hypertension Father   . Heart disease Father        Dec 69  . Heart attack Father   . Prostate cancer Father        Dx 38s; deceased 24  . Breast cancer Cousin        Dx 55s; daughter of a paternal uncle  . Ovarian cancer Paternal Aunt        Dx 72; deceased  . Ovarian cancer Cousin        Dx 37s; daughter of a paternal uncle  . Ovarian cancer Cousin        Dx 110; daughter of a paternal uncle  . Ovarian cancer Paternal Aunt        Dx 53s.  BRCA positive  . Ovarian cancer Other        pat grandfather's mother    Social History   Socioeconomic History  . Marital status: Married    Spouse name: Not on file  . Number of children: Not on file  . Years of education: Not on file  . Highest education level: Not on file  Social Needs  . Financial resource strain: Not on file  . Food insecurity - worry: Not on file  . Food insecurity - inability: Not on file  . Transportation needs - medical: Not on file  . Transportation needs - non-medical: Not on file  Occupational  History  . Not on file  Tobacco Use  . Smoking status: Former Smoker    Packs/day: 1.00    Years: 25.00    Pack years: 25.00    Last attempt to quit: 02/08/1996    Years since quitting: 20.8  . Smokeless tobacco: Never Used  Substance and Sexual Activity  . Alcohol use: Yes    Alcohol/week: 13.2 oz    Types: 7 Glasses of wine, 15 Standard drinks or equivalent per week  . Drug use: No  . Sexual activity: No    Partners: Male    Birth control/protection: Post-menopausal  Other Topics Concern  . Not on file  Social History Narrative  . Not on file    ROS:  Pertinent items are noted in HPI.  PHYSICAL EXAMINATION:    BP 120/80 (BP Location: Left Arm, Patient Position: Sitting, Cuff Size: Normal)   Pulse 80   Resp 16   Wt 153 lb (69.4 kg)   LMP 02/07/1998 (Approximate)   BMI 25.46 kg/m     General appearance: alert, cooperative and appears stated age Head: Normocephalic, without obvious abnormality, atraumatic Neck: no adenopathy, supple, symmetrical, trachea midline and thyroid normal to inspection and palpation Lungs: clear to auscultation bilaterally Heart: regular rate and rhythm Abdomen: soft, non-tender, no masses,  no organomegaly Extremities: extremities normal, atraumatic, no cyanosis or edema Skin: Skin color, texture, turgor normal. No rashes or lesions Lymph nodes: Cervical, supraclavicular, and axillary nodes normal. No abnormal inguinal nodes palpated Neurologic: Grossly normal  Pelvic: External genitalia:  no lesions              Urethra:  normal appearing urethra with no masses, tenderness or lesions              Bartholins and Skenes: normal                 Vagina: normal appearing vagina with normal color and discharge, no lesions              Cervix: no lesions                Bimanual Exam:  Uterus:  normal size, contour, position, consistency, mobility, non-tender              Adnexa: no mass, fullness, tenderness         Chaperone was present for  exam.  ASSESSMENT  BRCA1 positive.  Hx breast cancer.  Status post bilateral mastectomy.   PLAN  Discussed laparoscopic BSO with collection of pelvic washings.  Risks and benefits of surgery reviewed. Risks of laparoscopy include but are not limited to bleeding, infection, damage to surrounding organs, reaction to anesthesia, pneumonia, DVT, PE, death, hernia formation, need for further surgery including surgical staging if a hidden cancer were identified, and laparotomy.  Will check CA125.  If it is elevated, she will need referral to [place].  An After Visit Summary was printed and given to the patient.  _15_____ minutes face to face time of which over 50% was spent in counseling.

## 2016-12-21 LAB — CA 125: CANCER ANTIGEN (CA) 125: 33.6 U/mL (ref 0.0–38.1)

## 2016-12-27 ENCOUNTER — Ambulatory Visit
Admission: RE | Admit: 2016-12-27 | Discharge: 2016-12-27 | Disposition: A | Discharge status: Home / Self Care | Payer: BLUE CROSS/BLUE SHIELD | Source: Ambulatory Visit | Attending: Hematology | Admitting: Hematology

## 2016-12-27 DIAGNOSIS — Z780 Asymptomatic menopausal state: Secondary | ICD-10-CM | POA: Diagnosis not present

## 2016-12-27 DIAGNOSIS — E2839 Other primary ovarian failure: Secondary | ICD-10-CM

## 2016-12-27 DIAGNOSIS — Z13820 Encounter for screening for osteoporosis: Secondary | ICD-10-CM | POA: Diagnosis not present

## 2016-12-28 ENCOUNTER — Telehealth: Payer: Self-pay | Admitting: *Deleted

## 2016-12-28 NOTE — Telephone Encounter (Signed)
TCT patient. No answer . Left VM message on identified phone for patient that her Dexa scan was normal.

## 2017-01-02 ENCOUNTER — Other Ambulatory Visit: Payer: Self-pay

## 2017-01-02 ENCOUNTER — Encounter (HOSPITAL_BASED_OUTPATIENT_CLINIC_OR_DEPARTMENT_OTHER): Payer: Self-pay | Admitting: *Deleted

## 2017-01-02 NOTE — Progress Notes (Signed)
NPO AFTER MN.  ARRIVE AT 0530.  GETTING LAB WORK DONE Thursday 01-05-2017 (CBCdiff,CMET,T&S).  CURRENT EKG IN CHART AND Epic.  WILL TAKE EFFEXOR AND DO FLONASE SPRAY AM DOS W/ SIPS OF WATER.

## 2017-01-03 ENCOUNTER — Telehealth: Payer: Self-pay | Admitting: *Deleted

## 2017-01-03 NOTE — Telephone Encounter (Signed)
Call to patient. Per ROI can leave message on voice mail. Left message advising surgery time change to 0915, arrive at 0730.  Left message to call back to confirm receipt of message.

## 2017-01-04 NOTE — Telephone Encounter (Signed)
Return call from patient. Confirms time change for surgery.  Routing to provider for final review. Patient agreeable to disposition. Will close encounter.   '

## 2017-01-05 ENCOUNTER — Encounter: Payer: Self-pay | Admitting: Adult Health

## 2017-01-05 ENCOUNTER — Encounter (HOSPITAL_COMMUNITY)
Admission: RE | Admit: 2017-01-05 | Discharge: 2017-01-05 | Disposition: A | Discharge status: Home / Self Care | Payer: BLUE CROSS/BLUE SHIELD | Source: Ambulatory Visit | Attending: Obstetrics and Gynecology | Admitting: Obstetrics and Gynecology

## 2017-01-05 ENCOUNTER — Ambulatory Visit (HOSPITAL_BASED_OUTPATIENT_CLINIC_OR_DEPARTMENT_OTHER): Payer: BLUE CROSS/BLUE SHIELD | Admitting: Adult Health

## 2017-01-05 VITALS — BP 151/83 | HR 82 | Temp 98.6°F | Resp 20 | Ht 65.0 in | Wt 153.8 lb

## 2017-01-05 DIAGNOSIS — Z170 Estrogen receptor positive status [ER+]: Secondary | ICD-10-CM

## 2017-01-05 DIAGNOSIS — Z79899 Other long term (current) drug therapy: Secondary | ICD-10-CM | POA: Diagnosis not present

## 2017-01-05 DIAGNOSIS — C50111 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 female breast: Secondary | ICD-10-CM | POA: Insufficient documentation

## 2017-01-05 DIAGNOSIS — Z853 Personal history of malignant neoplasm of breast: Secondary | ICD-10-CM | POA: Diagnosis not present

## 2017-01-05 DIAGNOSIS — I10 Essential (primary) hypertension: Secondary | ICD-10-CM | POA: Diagnosis not present

## 2017-01-05 DIAGNOSIS — N951 Menopausal and female climacteric states: Secondary | ICD-10-CM | POA: Diagnosis not present

## 2017-01-05 DIAGNOSIS — Z1501 Genetic susceptibility to malignant neoplasm of breast: Secondary | ICD-10-CM | POA: Diagnosis not present

## 2017-01-05 DIAGNOSIS — N838 Other noninflammatory disorders of ovary, fallopian tube and broad ligament: Secondary | ICD-10-CM | POA: Diagnosis not present

## 2017-01-05 DIAGNOSIS — Z7982 Long term (current) use of aspirin: Secondary | ICD-10-CM | POA: Diagnosis not present

## 2017-01-05 DIAGNOSIS — J45909 Unspecified asthma, uncomplicated: Secondary | ICD-10-CM | POA: Diagnosis not present

## 2017-01-05 DIAGNOSIS — Z9013 Acquired absence of bilateral breasts and nipples: Secondary | ICD-10-CM | POA: Diagnosis not present

## 2017-01-05 DIAGNOSIS — Z87891 Personal history of nicotine dependence: Secondary | ICD-10-CM | POA: Diagnosis not present

## 2017-01-05 LAB — COMPREHENSIVE METABOLIC PANEL
ALK PHOS: 80 U/L (ref 38–126)
ALT: 16 U/L (ref 14–54)
ANION GAP: 8 (ref 5–15)
AST: 32 U/L (ref 15–41)
Albumin: 4.3 g/dL (ref 3.5–5.0)
BILIRUBIN TOTAL: 0.7 mg/dL (ref 0.3–1.2)
BUN: 16 mg/dL (ref 6–20)
CALCIUM: 9.3 mg/dL (ref 8.9–10.3)
CO2: 27 mmol/L (ref 22–32)
Chloride: 104 mmol/L (ref 101–111)
Creatinine, Ser: 0.67 mg/dL (ref 0.44–1.00)
Glucose, Bld: 114 mg/dL — ABNORMAL HIGH (ref 65–99)
POTASSIUM: 3.8 mmol/L (ref 3.5–5.1)
Sodium: 139 mmol/L (ref 135–145)
TOTAL PROTEIN: 6.9 g/dL (ref 6.5–8.1)

## 2017-01-05 LAB — CBC WITH DIFFERENTIAL/PLATELET
Basophils Absolute: 0 10*3/uL (ref 0.0–0.1)
Basophils Relative: 1 %
Eosinophils Absolute: 0.1 10*3/uL (ref 0.0–0.7)
Eosinophils Relative: 2 %
HEMATOCRIT: 39 % (ref 36.0–46.0)
Hemoglobin: 13 g/dL (ref 12.0–15.0)
LYMPHS ABS: 1.9 10*3/uL (ref 0.7–4.0)
Lymphocytes Relative: 36 %
MCH: 32.8 pg (ref 26.0–34.0)
MCHC: 33.3 g/dL (ref 30.0–36.0)
MCV: 98.5 fL (ref 78.0–100.0)
MONO ABS: 0.6 10*3/uL (ref 0.1–1.0)
MONOS PCT: 11 %
NEUTROS ABS: 2.7 10*3/uL (ref 1.7–7.7)
NEUTROS PCT: 50 %
Platelets: 235 10*3/uL (ref 150–400)
RBC: 3.96 MIL/uL (ref 3.87–5.11)
RDW: 12.9 % (ref 11.5–15.5)
WBC: 5.2 10*3/uL (ref 4.0–10.5)

## 2017-01-05 LAB — ABO/RH: ABO/RH(D): O NEG

## 2017-01-05 NOTE — Progress Notes (Signed)
CLINIC:  Survivorship   REASON FOR VISIT:  Routine follow-up post-treatment for a recent history of breast cancer.  BRIEF ONCOLOGIC HISTORY:  Oncology History   Cancer Staging 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 breast in female, estrogen receptor positive ([place]) Staging form: Breast, AJCC 8th Edition - Clinical stage from 03/08/2016: Stage IA (cT1c, cN0, cM0, G2, ER: Positive, PR: Negative, HER2: Negative) - Signed by Truitt Merle, MD on 03/15/2016 - Pathologic stage from 04/12/2016: Stage IA (pT1c, pN0, cM0, G2, ER: Positive, PR: Negative, HER2: Negative, Oncotype DX score: 49) - Signed by Truitt Merle, MD on 05/05/2016       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 breast in female, estrogen receptor positive ([place])   03/04/2016 Mammogram    Diagnostic bilateral mammogram and right breast ultrasound showed a 1.7 cm mass in the 12:00 position of the right breast, highly suspicious for malignancy. There is a borderline abnormal right axillary lymph node, also suspicious for metastasis.       03/08/2016 Initial Diagnosis    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 breast in female, estrogen receptor positive ([place])      03/08/2016 Initial Biopsy    Right breast 12:00 core needle biopsy showed invasive ductal carcinoma, grade 2, right axillary lymph node biopsy was negative.      03/08/2016 Receptors her2    ER 20% positive, PR negative, HER-2 negative, Ki-67 20%      03/30/2016 Imaging    MR Bilateral Breast 03/30/2016 IMPRESSION: Known unifocal right breast malignancy. No MRI evidence of malignancy in the left breast.      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  Genes Analyzed: 43 genes on [facility]'s Common Cancers panel (APC, ATM, AXIN2, BARD1, BMPR1A, BRCA1, BRCA2, BRIP1, CDH1, CDKN2A, CHEK2, DICER1, EPCAM, GREM1, HOXB13, KIT, MEN1, MLH1, MSH2, MSH6, MUTYH, NBN, NF1, PALB2, PDGFRA, PMS2, POLD1, POLE, PTEN, RAD50,  RAD51C, RAD51D, SDHA, SDHB, SDHC, SDHD, SMAD4, SMARCA4, STK11, TP53, TSC1, TSC2, VHL).       04/12/2016 Surgery    Bilateral mastectomy and right sentinel lymph node biopsy, by [name]      04/12/2016 Pathology Results    Right breast mastectomy showed invasive grade 2 invasive ductal carcinoma, 1.7 cm, margins were negative, 3 sentinel lymph nodes were negative, left simple mastectomy fibroadenoma, one benign node, no atypia or tumor.       05/04/2016 Oncotype testing    Recurrence score 49, high-risk, predicts 10 year risk of distant recurrence 32% with tamoxifen alone.      05/10/2016 Echocardiogram    Echo on 05/10/16 showed EF 55-60%.      05/20/2016 - 09/29/2016 Chemotherapy    dose dense Adriamycin and Cytoxan, every 2 weeks starting 05/20/16 for 4 cycles, followed by Taxol starting 07/14/16 (weekly for 12 weeks or biweekly for 4 cycles) ending 09/30/16. Weekly Taxol Changed to Abaraxane 137m/m2 on 09/01/17 due to skin rash.       11/2016 -  Anti-estrogen oral therapy    Letrozole daily       [place]   04/12/2016 Initial Diagnosis    Breast cancer, right ([facility]       INTERVAL HISTORY:  [name] initial meeting to review her survivorship care plan detailing her treatment course for breast cancer, as well as monitoring long-term side effects of that treatment, education regarding health maintenance, screening, and overall wellness and  health promotion.     Overall, Chelsea Salinas reports feeling quite well.  She had a difficult time earlier this fall with taking Anastrozole.  She is taking Letrozole and she is doing better.  She still has issues with hot flashes, she is managing with Lexapro, Effexor, Clonidine, and Gabapentin.  The hot flashes are not as significant at night anymore, just troublesome during the day.  She is BRCA 1 positive and has upcoming TAH/BSO due to this.  She does have h/o abnormal pap smear, and  is going to f/u with Dr. Quincy Simmonds on whether continued PAP after this Is warranted.      REVIEW OF SYSTEMS:  Review of Systems  Constitutional: Negative for appetite change, chills, diaphoresis, fatigue, fever and unexpected weight change.  HENT:   Negative for hearing loss, lump/mass and sore throat.   Eyes: Negative for eye problems and icterus.  Respiratory: Negative for chest tightness, cough and shortness of breath.   Cardiovascular: Negative for chest pain, leg swelling and palpitations.  Gastrointestinal: Negative for abdominal distention, abdominal pain and constipation.  Endocrine: Positive for hot flashes.  Musculoskeletal: Negative for arthralgias.  Skin: Negative for itching and rash.  Neurological: Negative for dizziness, extremity weakness, headaches and numbness.  Hematological: Negative for adenopathy. Does not bruise/bleed easily.  Psychiatric/Behavioral: Negative for depression. The patient is not nervous/anxious.   Breast: Denies any new nodularity, masses, tenderness, nipple changes, or nipple discharge.      ONCOLOGY TREATMENT TEAM:  1. Surgeon:  [name] at [facility] 2. Medical Oncologist: Dr. Burr Medico      PAST MEDICAL/SURGICAL HISTORY:  Past Medical History:  Diagnosis Date  . 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 Breast cancer, right breast ([place]) dx 03/08/2016---  oncolgoist-  [name]   Stage 1A (pT1, pN0, pM0)  Grade 2,  ER+, PR-, HER2 - ;  invasive ductal carcinoma s/p  bilateral mastectomies w/ sln bx's (negative) and completed chemotherapy (05-20-2016 to 09-29-2016)  . Eczema   . Family history of breast cancer   . Family history of genetic disease carrier    paternal relatives with a BRCA mutation  . Family history of ovarian cancer   . History of abnormal cervical Pap smear age 70s  . History of exercise intolerance 06/27/2006   normal  . HSV-2 infection   . Hypertension   . Seasonal and perennial  allergic rhinitis   . Wears contact lenses    Past Surgical History:  Procedure Laterality Date  . BREAST BIOPSY Right 04/13/2016   Procedure: Evacuation RIGHT Breast  Hematoma;  Surgeon: [name], MD;  Location: [place];  Service: General;  Laterality: Right;  . COLONOSCOPY    . DILATION AND CURETTAGE OF UTERUS  2008  . MASTECTOMY W/ SENTINEL NODE BIOPSY Bilateral 04/12/2016   Procedure: BILATERAL TOTAL MASTECTOMIES  WITH RIGHT SENTINEL LYMPH NODE BIOPSY;  Surgeon: [name], MD;  Location: [place];  Service: General;  Laterality: Bilateral;  . PORTACATH PLACEMENT Right 05/19/2016   Procedure: INSERTION PORT-A-CATH WITH [place];  Surgeon: [name], MD;  Location: [place];  Service: General;  Laterality: Right;  . TRANSTHORACIC ECHOCARDIOGRAM  05/10/2016   ef 28-78%, grade 1 diastolic dyfuntion/  trivial TR     ALLERGIES:  No Known Allergies   CURRENT MEDICATIONS:  Outpatient Encounter Medications as of 01/05/2017  Medication Sig  . acetaminophen (TYLENOL) 500 MG tablet Take 500-1,000 mg by mouth every 6 (six) hours as  needed for mild pain or headache (depends on pain if takes 1-2 tablets).  Marland Kitchen acyclovir ointment (ZOVIRAX) 5 % Apply 1 application topically every 3 (three) hours. (Patient taking differently: Apply 1 application topically as needed. )  . aspirin EC 81 MG tablet Take 81 mg by mouth daily.  . Calcium-Magnesium-Vitamin D (CALCIUM 1200+D3 PO) Take 1 tablet by mouth every evening.  . cetirizine (ZYRTEC) 10 MG tablet Take 10 mg by mouth at bedtime.   . Cholecalciferol (VITAMIN D) 2000 units tablet Take 2,000 Units by mouth daily.  . cloNIDine (CATAPRES) 0.2 MG tablet TAKE 1 TABLET(0.2 MG) BY MOUTH AT BEDTIME (Patient taking differently: Take 0.2 mg by mouth at bedtime. TAKE 1 TABLET(0.2 MG) BY MOUTH AT BEDTIME)  . Cyanocobalamin (B-12) 1000 MCG SUBL Place 1 tablet under the tongue every other day.  . escitalopram (LEXAPRO) 20 MG tablet TAKE 1 TABLET(20 MG) BY MOUTH  DAILY (Patient taking differently: Take 20 mg by mouth daily with lunch. TAKE 1 TABLET(20 MG) BY MOUTH DAILY)  . Flaxseed, Linseed, (FLAXSEED OIL PO) Take 1,400 mg by mouth every evening.  . fluticasone (FLONASE ALLERGY RELIEF) 50 MCG/ACT nasal spray Place 1 spray into both nostrils every morning.   . gabapentin (NEURONTIN) 100 MG capsule Take 1-3 capsules (100-300 mg total) by mouth at bedtime. (Patient taking differently: Take 100-300 mg by mouth at bedtime. )  . letrozole (FEMARA) 2.5 MG tablet Take 1 tablet (2.5 mg total) by mouth daily. (Patient taking differently: Take 2.5 mg by mouth every morning. )  . losartan (COZAAR) 100 MG tablet Take 1 tablet (100 mg total) by mouth daily. (Patient taking differently: Take 100 mg by mouth every evening. )  . magnesium oxide (MAG-OX) 400 MG tablet Take 400 mg by mouth every other day.  . montelukast (SINGULAIR) 10 MG tablet TAKE 1 TABLET(10 MG) BY MOUTH DAILY (Patient taking differently: Take 10 mg by mouth daily as needed. TAKE 1 TABLET(10 MG) BY MOUTH DAILY)  . Multiple Vitamins-Minerals (MULTIVITAMIN WITH MINERALS) tablet Take 1 tablet by mouth every evening.   . Omega-3 Fatty Acids ([employer] FISH OIL) 1200 MG CAPS Take 1,200 mg by mouth every evening.   . valACYclovir (VALTREX) 500 MG tablet Take 1 tablet (500 mg total) by mouth 2 (two) times daily. Take for 3 days as needed. (Patient taking differently: Take 500 mg by mouth 2 (two) times daily as needed. Take for 3 days as needed.)  . venlafaxine XR (EFFEXOR-XR) 75 MG 24 hr capsule TAKE 2 CAPSULES BY MOUTH EVERY MORNING AND 1 CAPSULE EVERY EVENING (Patient taking differently: Take by mouth 2 (two) times daily. TAKE 2 CAPSULES BY MOUTH EVERY MORNING AND 1 CAPSULE EVERY EVENING)   No facility-administered encounter medications on file as of 01/05/2017.      ONCOLOGIC FAMILY HISTORY:  Family History  Problem Relation Age of Onset  . Polymyalgia rheumatica Mother   . Hypertension Father   . Heart  disease Father        Dec 69  . Heart attack Father   . Prostate cancer Father        Dx 90s; deceased 79  . Breast cancer Cousin        Dx 43s; daughter of a paternal uncle  . Ovarian cancer Paternal Aunt        Dx 32; deceased  . Ovarian cancer Cousin        Dx 29s; daughter of a paternal uncle  . Ovarian cancer Cousin  Dx 69; daughter of a paternal uncle  . Ovarian cancer Paternal Aunt        Dx 48s.  BRCA positive  . Ovarian cancer Other        pat grandfather's mother     GENETIC COUNSELING/TESTING: BRCA 1 positive  SOCIAL HISTORY:  Chelsea Salinas is married and lives with her husband in [place], [place].   Chelsea Salinas is currently retired.  She denies any current or history of tobacco, alcohol, or illicit drug use.     PHYSICAL EXAMINATION:  Vital Signs:   Vitals:   01/05/17 1355  BP: (!) 151/83  Pulse: 82  Resp: 20  Temp: 98.6 F (37 C)  SpO2: 100%   Filed Weights   01/05/17 1355  Weight: 153 lb 12.8 oz (69.8 kg)   General: Well-nourished, well-appearing female in no acute distress.  She is unaccompanied today.   HEENT: Head is normocephalic.  Pupils equal and reactive to light. Conjunctivae clear without exudate.  Sclerae anicteric. Oral mucosa is pink, moist.  Oropharynx is pink without lesions or erythema.  Lymph: No cervical, supraclavicular, or infraclavicular lymphadenopathy noted on palpation.  Cardiovascular: Regular rate and rhythm.Marland Kitchen Respiratory: Clear to auscultation bilaterally. Chest expansion symmetric; breathing non-labored.  GI: Abdomen soft and round; non-tender, non-distended. Bowel sounds normoactive.  GU: Deferred.  Neuro: No focal deficits. Steady gait.  Psych: Mood and affect normal and appropriate for situation.  Extremities: No edema. MSK: No focal spinal tenderness to palpation.  Full range of motion in bilateral upper extremities Skin: Warm and dry.  LABORATORY DATA:  None for this visit.  DIAGNOSTIC IMAGING:    None for this visit.      ASSESSMENT AND PLAN:  Ms.. Salinas is a pleasant 61 y.o. female with Stage IA right breast invasive ductal carcinoma, ER+/PR-/HER2-, diagnosed in 02/2016, treated with bilateral mastectomy, adjuvant chemotherapy, and anti-estrogen therapy with Letrozole beginning in 11/2016.  She presents to the Survivorship Clinic for our initial meeting and routine follow-up post-completion of treatment for breast cancer.    1. Stage IA right breast cancer:  Chelsea Salinas is continuing to recover from definitive treatment for breast cancer. She will follow-up with her medical oncologist, Dr. Burr Medico in 01/2017 with history and physical exam per surveillance protocol.  She will continue her anti-estrogen therapy with Letrozole. Thus far, she is tolerating the Letrozole moderately well, with minimal side effects (see #2). Today, a comprehensive survivorship care plan and treatment summary was reviewed with the patient today detailing her breast cancer diagnosis, treatment course, potential late/long-term effects of treatment, appropriate follow-up care with recommendations for the future, and patient education resources.  A copy of this summary, along with a letter will be sent to the patient's primary care provider via mail/fax/In Basket message after today's visit.    2. Hot flashes: She is having a challenging time with this.  I recommended that she try acupuncture as well.  I will check with Dawn and see if she has anyone she recommends for this.  We also reviewed non pharmacologic interventions including avoiding caffeine, hot spicy foods, alcohol, warm beverages, and the use of an ice pack or a cooling pillow at night.    3. Bone health:  Given Chelsea Salinas age/history of breast cancer and her current treatment regimen including anti-estrogen therapy with Letrozole, she is at risk for bone demineralization.  Her last DEXA scan was in 12/2016 and demonstrated a bone density of -0.7 (normal).  She  will be  due for repeat bone density in 2 years.  In the meantime, she was encouraged to increase her consumption of foods rich in calcium, as well as increase her weight-bearing activities.  She was given education on specific activities to promote bone health.  4. Cancer screening:  Due to Chelsea Salinas history and her age, she should receive screening for skin cancers, colon cancer, and gynecologic cancers.  The information and recommendations are listed on the patient's comprehensive care plan/treatment summary and were reviewed in detail with the patient.    5. Health maintenance and wellness promotion: Chelsea Salinas was encouraged to consume 5-7 servings of fruits and vegetables per day. We reviewed the "Nutrition Rainbow" handout, as well as the handout "Take Control of Your Health and Reduce Your Cancer Risk" from the [employer].  She was also encouraged to engage in moderate to vigorous exercise for 30 minutes per day most days of the week. We discussed the [employer] fitness program, which is designed for cancer survivors to help them become more physically fit after cancer treatments.  She was instructed to limit her alcohol consumption and continue to abstain from tobacco use.     6. Support services/counseling: It is not uncommon for this period of the patient's cancer care trajectory to be one of many emotions and stressors.  We discussed an opportunity for her to participate in the next session of [facility] ("Finding Your New Normal") support group series designed for patients after they have completed treatment.   Chelsea Salinas was encouraged to take advantage of our many other support services programs, support groups, and/or counseling in coping with her new life as a cancer survivor after completing anti-cancer treatment.  She was offered support today through active listening and expressive supportive counseling.  She was given information regarding our available services and encouraged  to contact me with any questions or for help enrolling in any of our support group/programs.    Dispo:   -Return to cancer center for f/u with Dr. Burr Medico in 01/2017 -Follow up with [name] in 05/2017 -Repeat bone density in 12/2018 -She is welcome to return back to the Survivorship Clinic at any time; no additional follow-up needed at this time.  -Consider referral back to survivorship as a long-term survivor for continued surveillance  A total of (30) minutes of face-to-face time was spent with this patient with greater than 50% of that time in counseling and care-coordination.   Gardenia Phlegm, [place] [phone]   Note: PRIMARY CARE PROVIDER [name], [place] [phone]

## 2017-01-06 NOTE — Progress Notes (Signed)
Left message pt to arrive 705 am [facility], npo after follow all other pre op instructions, call back and confirm message received.

## 2017-01-07 ENCOUNTER — Other Ambulatory Visit: Payer: Self-pay | Admitting: Internal Medicine

## 2017-01-09 ENCOUNTER — Ambulatory Visit (HOSPITAL_BASED_OUTPATIENT_CLINIC_OR_DEPARTMENT_OTHER): Payer: BLUE CROSS/BLUE SHIELD | Admitting: Anesthesiology

## 2017-01-09 ENCOUNTER — Encounter (HOSPITAL_BASED_OUTPATIENT_CLINIC_OR_DEPARTMENT_OTHER)
Admission: RE | Disposition: A | Discharge status: Home / Self Care | Payer: Self-pay | Source: Ambulatory Visit | Attending: Obstetrics and Gynecology

## 2017-01-09 ENCOUNTER — Encounter (HOSPITAL_BASED_OUTPATIENT_CLINIC_OR_DEPARTMENT_OTHER): Payer: Self-pay

## 2017-01-09 ENCOUNTER — Ambulatory Visit (HOSPITAL_BASED_OUTPATIENT_CLINIC_OR_DEPARTMENT_OTHER)
Admission: RE | Admit: 2017-01-09 | Discharge: 2017-01-09 | Disposition: A | Discharge status: Home / Self Care | Payer: BLUE CROSS/BLUE SHIELD | Source: Ambulatory Visit | Attending: Obstetrics and Gynecology | Admitting: Obstetrics and Gynecology

## 2017-01-09 DIAGNOSIS — Z87891 Personal history of nicotine dependence: Secondary | ICD-10-CM | POA: Insufficient documentation

## 2017-01-09 DIAGNOSIS — Z1509 Genetic susceptibility to other malignant neoplasm: Secondary | ICD-10-CM | POA: Diagnosis not present

## 2017-01-09 DIAGNOSIS — J45909 Unspecified asthma, uncomplicated: Secondary | ICD-10-CM | POA: Diagnosis not present

## 2017-01-09 DIAGNOSIS — Z7982 Long term (current) use of aspirin: Secondary | ICD-10-CM | POA: Diagnosis not present

## 2017-01-09 DIAGNOSIS — Z4002 Encounter for prophylactic removal of ovary: Secondary | ICD-10-CM | POA: Diagnosis not present

## 2017-01-09 DIAGNOSIS — I10 Essential (primary) hypertension: Secondary | ICD-10-CM | POA: Insufficient documentation

## 2017-01-09 DIAGNOSIS — Z1501 Genetic susceptibility to malignant neoplasm of breast: Secondary | ICD-10-CM | POA: Diagnosis not present

## 2017-01-09 DIAGNOSIS — Z853 Personal history of malignant neoplasm of breast: Secondary | ICD-10-CM | POA: Insufficient documentation

## 2017-01-09 DIAGNOSIS — Z9013 Acquired absence of bilateral breasts and nipples: Secondary | ICD-10-CM | POA: Diagnosis not present

## 2017-01-09 DIAGNOSIS — Z79899 Other long term (current) drug therapy: Secondary | ICD-10-CM | POA: Insufficient documentation

## 2017-01-09 DIAGNOSIS — N838 Other noninflammatory disorders of ovary, fallopian tube and broad ligament: Secondary | ICD-10-CM | POA: Diagnosis not present

## 2017-01-09 DIAGNOSIS — R8569 Abnormal cytological findings in specimens from other digestive organs and abdominal cavity: Secondary | ICD-10-CM | POA: Diagnosis not present

## 2017-01-09 DIAGNOSIS — E559 Vitamin D deficiency, unspecified: Secondary | ICD-10-CM | POA: Diagnosis not present

## 2017-01-09 HISTORY — DX: Other seasonal allergic rhinitis: J30.89

## 2017-01-09 HISTORY — DX: Personal history of other specified conditions: Z87.898

## 2017-01-09 HISTORY — DX: Herpesviral infection, unspecified: B00.9

## 2017-01-09 HISTORY — PX: LAPAROSCOPIC SALPINGO OOPHERECTOMY: SHX5927

## 2017-01-09 HISTORY — DX: Presence of spectacles and contact lenses: Z97.3

## 2017-01-09 HISTORY — DX: Personal history of other diseases of the female genital tract: Z87.42

## 2017-01-09 HISTORY — DX: Other seasonal allergic rhinitis: J30.2

## 2017-01-09 LAB — TYPE AND SCREEN
ABO/RH(D): O NEG
Antibody Screen: NEGATIVE

## 2017-01-09 SURGERY — SALPINGO-OOPHORECTOMY, LAPAROSCOPIC
Anesthesia: General | Site: Abdomen | Laterality: Bilateral

## 2017-01-09 MED ORDER — ONDANSETRON HCL 4 MG/2ML IJ SOLN
INTRAMUSCULAR | Status: DC | PRN
  Administered 2017-01-09: 4 mg via INTRAVENOUS

## 2017-01-09 MED ORDER — ROCURONIUM BROMIDE 50 MG/5ML IV SOSY
PREFILLED_SYRINGE | INTRAVENOUS | Status: AC
  Filled 2017-01-09: qty 5

## 2017-01-09 MED ORDER — FENTANYL CITRATE (PF) 100 MCG/2ML IJ SOLN
INTRAMUSCULAR | Status: DC | PRN
  Administered 2017-01-09: 50 ug via INTRAVENOUS
  Administered 2017-01-09: 100 ug via INTRAVENOUS
  Administered 2017-01-09: 50 ug via INTRAVENOUS

## 2017-01-09 MED ORDER — KETOROLAC TROMETHAMINE 30 MG/ML IJ SOLN
INTRAMUSCULAR | Status: DC | PRN
  Administered 2017-01-09: 30 mg via INTRAVENOUS

## 2017-01-09 MED ORDER — HYDROMORPHONE HCL 1 MG/ML IJ SOLN
0.2500 mg | INTRAMUSCULAR | Status: DC | PRN
  Administered 2017-01-09 (×4): 0.5 mg via INTRAVENOUS
  Filled 2017-01-09: qty 0.5

## 2017-01-09 MED ORDER — IBUPROFEN 800 MG PO TABS: 800.0000 mg | ORAL_TABLET | Freq: Three times a day (TID) | ORAL | 0 refills | Status: DC | PRN

## 2017-01-09 MED ORDER — SUGAMMADEX SODIUM 200 MG/2ML IV SOLN
INTRAVENOUS | Status: DC | PRN
  Administered 2017-01-09: 200 mg via INTRAVENOUS

## 2017-01-09 MED ORDER — KETOROLAC TROMETHAMINE 30 MG/ML IJ SOLN
30.0000 mg | Freq: Once | INTRAMUSCULAR | Status: DC | PRN
  Filled 2017-01-09: qty 1

## 2017-01-09 MED ORDER — KETOROLAC TROMETHAMINE 30 MG/ML IJ SOLN
INTRAMUSCULAR | Status: AC
  Filled 2017-01-09: qty 1

## 2017-01-09 MED ORDER — GLYCOPYRROLATE 0.2 MG/ML IJ SOLN
INTRAMUSCULAR | Status: DC | PRN
  Administered 2017-01-09: 0.2 mg via INTRAVENOUS

## 2017-01-09 MED ORDER — CEFAZOLIN SODIUM-DEXTROSE 2-4 GM/100ML-% IV SOLN
INTRAVENOUS | Status: AC
  Filled 2017-01-09: qty 100

## 2017-01-09 MED ORDER — SUGAMMADEX SODIUM 200 MG/2ML IV SOLN
INTRAVENOUS | Status: AC
  Filled 2017-01-09: qty 2

## 2017-01-09 MED ORDER — ACETAMINOPHEN 10 MG/ML IV SOLN
INTRAVENOUS | Status: DC | PRN
  Administered 2017-01-09: 1000 mg via INTRAVENOUS

## 2017-01-09 MED ORDER — MIDAZOLAM HCL 2 MG/2ML IJ SOLN
INTRAMUSCULAR | Status: AC
  Filled 2017-01-09: qty 2

## 2017-01-09 MED ORDER — FENTANYL CITRATE (PF) 100 MCG/2ML IJ SOLN
INTRAMUSCULAR | Status: AC
  Filled 2017-01-09: qty 2

## 2017-01-09 MED ORDER — OXYCODONE HCL 5 MG PO TABS
ORAL_TABLET | ORAL | Status: AC
  Filled 2017-01-09: qty 1

## 2017-01-09 MED ORDER — PROPOFOL 10 MG/ML IV BOLUS
INTRAVENOUS | Status: AC
  Filled 2017-01-09: qty 20

## 2017-01-09 MED ORDER — HYDROMORPHONE HCL 1 MG/ML IJ SOLN
INTRAMUSCULAR | Status: AC
  Filled 2017-01-09: qty 1

## 2017-01-09 MED ORDER — OXYCODONE-ACETAMINOPHEN 5-325 MG PO TABS: 1.0000 | ORAL_TABLET | ORAL | 0 refills | Status: DC | PRN

## 2017-01-09 MED ORDER — LIDOCAINE HCL (CARDIAC) 20 MG/ML IV SOLN
INTRAVENOUS | Status: DC | PRN
  Administered 2017-01-09: 60 mg via INTRAVENOUS

## 2017-01-09 MED ORDER — LACTATED RINGERS IV SOLN
INTRAVENOUS | Status: DC
  Filled 2017-01-09: qty 1000

## 2017-01-09 MED ORDER — SODIUM CHLORIDE 0.9 % IR SOLN
Status: DC | PRN
  Administered 2017-01-09: 1000 mL

## 2017-01-09 MED ORDER — OXYCODONE HCL 5 MG/5ML PO SOLN
5.0000 mg | Freq: Once | ORAL | Status: AC | PRN
  Filled 2017-01-09: qty 5

## 2017-01-09 MED ORDER — DEXAMETHASONE SODIUM PHOSPHATE 4 MG/ML IJ SOLN
INTRAMUSCULAR | Status: DC | PRN
  Administered 2017-01-09: 10 mg via INTRAVENOUS

## 2017-01-09 MED ORDER — ONDANSETRON HCL 4 MG/2ML IJ SOLN
INTRAMUSCULAR | Status: AC
  Filled 2017-01-09: qty 2

## 2017-01-09 MED ORDER — LIDOCAINE 2% (20 MG/ML) 5 ML SYRINGE
INTRAMUSCULAR | Status: AC
Start: 2017-01-09 — End: ?
  Filled 2017-01-09: qty 5

## 2017-01-09 MED ORDER — DEXAMETHASONE SODIUM PHOSPHATE 10 MG/ML IJ SOLN
INTRAMUSCULAR | Status: AC
  Filled 2017-01-09: qty 1

## 2017-01-09 MED ORDER — ROCURONIUM BROMIDE 100 MG/10ML IV SOLN
INTRAVENOUS | Status: DC | PRN
  Administered 2017-01-09: 40 mg via INTRAVENOUS

## 2017-01-09 MED ORDER — PROMETHAZINE HCL 25 MG/ML IJ SOLN
6.2500 mg | INTRAMUSCULAR | Status: DC | PRN
  Filled 2017-01-09: qty 1

## 2017-01-09 MED ORDER — PROPOFOL 10 MG/ML IV BOLUS
INTRAVENOUS | Status: DC | PRN
  Administered 2017-01-09: 150 mg via INTRAVENOUS

## 2017-01-09 MED ORDER — MIDAZOLAM HCL 5 MG/5ML IJ SOLN
INTRAMUSCULAR | Status: DC | PRN
  Administered 2017-01-09: 2 mg via INTRAVENOUS

## 2017-01-09 MED ORDER — ACETAMINOPHEN 10 MG/ML IV SOLN
INTRAVENOUS | Status: AC
  Filled 2017-01-09: qty 100

## 2017-01-09 MED ORDER — OXYCODONE HCL 5 MG PO TABS
5.0000 mg | ORAL_TABLET | Freq: Once | ORAL | Status: AC | PRN
  Administered 2017-01-09: 5 mg via ORAL
  Filled 2017-01-09: qty 1

## 2017-01-09 MED ORDER — CEFAZOLIN SODIUM-DEXTROSE 2-4 GM/100ML-% IV SOLN
2.0000 g | INTRAVENOUS | Status: AC
  Administered 2017-01-09: 2 g via INTRAVENOUS
  Filled 2017-01-09: qty 100

## 2017-01-09 MED ORDER — LACTATED RINGERS IV SOLN
INTRAVENOUS | Status: DC
  Administered 2017-01-09: 1000 mL via INTRAVENOUS
  Filled 2017-01-09: qty 1000

## 2017-01-09 MED FILL — OXYCOD/ACETAMINOPHEN 5-325M: 5-325 | 5 days supply | Qty: 30 | Fill #0

## 2017-01-09 SURGICAL SUPPLY — 37 items
CABLE HIGH FREQUENCY MONO STRZ (ELECTRODE) IMPLANT
CANISTER SUCT 3000ML PPV (MISCELLANEOUS) ×2 IMPLANT
DERMABOND ADVANCED (GAUZE/BANDAGES/DRESSINGS) ×1
DERMABOND ADVANCED .7 DNX12 (GAUZE/BANDAGES/DRESSINGS) ×1 IMPLANT
DRSG COVADERM PLUS 2X2 (GAUZE/BANDAGES/DRESSINGS) ×4 IMPLANT
DRSG OPSITE POSTOP 3X4 (GAUZE/BANDAGES/DRESSINGS) ×2 IMPLANT
DRSG TEGADERM 4X4.75 (GAUZE/BANDAGES/DRESSINGS) ×2 IMPLANT
FORCEPS CUTTING 33CM 5MM (CUTTING FORCEPS) IMPLANT
GLOVE BIO SURGEON STRL SZ 6.5 (GLOVE) ×4 IMPLANT
GLOVE BIOGEL PI IND STRL 7.0 (GLOVE) ×2 IMPLANT
GLOVE BIOGEL PI INDICATOR 7.0 (GLOVE) ×2
GOWN STRL REUS W/TWL LRG LVL3 (GOWN DISPOSABLE) ×4 IMPLANT
LIGASURE VESSEL 5MM BLUNT TIP (ELECTROSURGICAL) ×2 IMPLANT
NEEDLE INSUFFLATION 120MM (ENDOMECHANICALS) ×2 IMPLANT
NS IRRIG 1000ML POUR BTL (IV SOLUTION) ×2 IMPLANT
PACK LAPAROSCOPY BASIN (CUSTOM PROCEDURE TRAY) ×2 IMPLANT
PACK TRENDGUARD 450 HYBRID PRO (MISCELLANEOUS) ×1 IMPLANT
PACK TRENDGUARD 600 HYBRD PROC (MISCELLANEOUS) IMPLANT
POUCH SPECIMEN RETRIEVAL 10MM (ENDOMECHANICALS) IMPLANT
PROTECTOR NERVE ULNAR (MISCELLANEOUS) ×4 IMPLANT
SCISSORS LAP 5X35 DISP (ENDOMECHANICALS) IMPLANT
SET IRRIG TUBING LAPAROSCOPIC (IRRIGATION / IRRIGATOR) ×2 IMPLANT
SLEEVE ADV FIXATION 5X100MM (TROCAR) ×2 IMPLANT
SUT PLAIN 3 0 FS 2 27 (SUTURE) ×2 IMPLANT
SUT VIC AB 4-0 SH 27 (SUTURE) ×1
SUT VIC AB 4-0 SH 27XANBCTRL (SUTURE) ×1 IMPLANT
SUT VICRYL 0 UR6 27IN ABS (SUTURE) ×2 IMPLANT
SYSTEM CARTER THOMASON II (TROCAR) ×2 IMPLANT
TOWEL OR 17X24 6PK STRL BLUE (TOWEL DISPOSABLE) ×4 IMPLANT
TRAY FOLEY CATH SILVER 14FR (SET/KITS/TRAYS/PACK) ×2 IMPLANT
TRENDGUARD 450 HYBRID PRO PACK (MISCELLANEOUS) ×2
TRENDGUARD 600 HYBRID PROC PK (MISCELLANEOUS)
TROCAR ADV FIXATION 5X100MM (TROCAR) ×2 IMPLANT
TROCAR BLADELESS OPT 5 100 (ENDOMECHANICALS) ×2 IMPLANT
TROCAR XCEL NON-BLD 11X100MML (ENDOMECHANICALS) ×2 IMPLANT
TROCAR XCEL NON-BLD 5MMX100MML (ENDOMECHANICALS) IMPLANT
WARMER LAPAROSCOPE (MISCELLANEOUS) ×2 IMPLANT

## 2017-01-09 NOTE — Brief Op Note (Signed)
01/09/2017  10:41 AM  PATIENT:  Chelsea Salinas  61 y.o. female  PRE-OPERATIVE DIAGNOSIS:  BRCA 1 positive  POST-OPERATIVE DIAGNOSIS:  * No post-op diagnosis entered *  PROCEDURE:  Procedure(s): LAPAROSCOPIC SALPINGO OOPHORECTOMY with pelvic washings (Bilateral)  SURGEON:  Surgeon(s) and Role:    * [name], MD - Primary    * [name] Francesca Jewett, MD - Assisting  PHYSICIAN ASSISTANT:  NA  ASSISTANTS:  Dorothy Spark, MD   ANESTHESIA:   local and general  EBL:  10 mL   IVF:  750 cc.   UO:  300 cc.   BLOOD ADMINISTERED:none  DRAINS: none   LOCAL MEDICATIONS USED:  MARCAINE 0.25%.  SPECIMEN:  Source of Specimen:  pelvic washings, bilateral tubes and ovaries.  DISPOSITION OF SPECIMEN:  PATHOLOGY  COUNTS:  YES  TOURNIQUET:  * No tourniquets in log *  DICTATION: .Note written in [place]: Discharge to home after PACU  PATIENT DISPOSITION:  PACU - hemodynamically stable.   Delay start of Pharmacological VTE agent (>24hrs) due to surgical blood loss or risk of bleeding: not applicable

## 2017-01-09 NOTE — Progress Notes (Signed)
Update to history and physical  No marked change in status since office preop visit.  Patient examined.   OK to proceed with surgery.

## 2017-01-09 NOTE — Anesthesia Postprocedure Evaluation (Signed)
Anesthesia Post Note  Patient: Chelsea Salinas  Procedure(s) Performed: LAPAROSCOPIC SALPINGO OOPHORECTOMY with pelvic washings (Bilateral Abdomen)     Patient location during evaluation: PACU Anesthesia Type: General Level of consciousness: awake and alert Pain management: pain level controlled Vital Signs Assessment: post-procedure vital signs reviewed and stable Respiratory status: spontaneous breathing, nonlabored ventilation, respiratory function stable and patient connected to nasal cannula oxygen Cardiovascular status: blood pressure returned to baseline and stable Postop Assessment: no apparent nausea or vomiting Anesthetic complications: no    Last Vitals:  Vitals:   01/09/17 1100 01/09/17 1115  BP: (!) 170/99 (!) 152/94  Pulse: 82 89  Resp: 16 13  Temp:    SpO2: 97% 93%    Last Pain:  Vitals:   01/09/17 1125  TempSrc:   PainSc: (P) 4                  Murna Backer S

## 2017-01-09 NOTE — Op Note (Signed)
OPERATIVE REPORT   PREOPERATIVE DIAGNOSES:  BRCA1 positive status, personal history of right breast cancer.  POSTOPERATIVE DIAGNOSES:   BRCA1 positive status, personal history of right breast cancer.     PROCEDURES:   Laparoscopic bilateral salpingo-oophorectomy with collection of pelvic washings.  SURGEON:   E. Silva, M.D.  ASSISTANT:   Jill E. Jertson, M.D.  ANESTHESIA:  General endotracheal, local with 0.25% Marcaine.  EBL:  10 cc.   URINE OUTPUT:   300 cc.   IV FLUIDS:   750 cc.   COMPLICATIONS:  None.  INDICATIONS FOR THE PROCEDURE:  The patient is a 61 year old gravida 1, para 0 Caucasian female who presents with a history of right breast cancer and positive BRCA1 status. She is status post bilateral mastectomy. Pelvic ultrasound documented a normal uterus and ovaries.  A CA125 was normal. The  patient desires surgical care, and a plan is made to proceed now with a laparoscopic bilateral salpingo-oophorectomy with collection of pelvic washings.  Risks, benefits, and alternatives are reviewed with the patient, who wishes to proceed.  FINDINGS:  Examination under anesthesia revealed a small uterus and no adnexal masses.   Laparoscopy demonstrated a normal uterus and bilateral tubes and ovaries.  The liver, gall bladder, appendix, omentum, bowel, and peritoneal surfaces were normal.  There was no evidence of ascites.  The ureters were noted to peristalse along the bilateral pelvic sidewalls.   SPECIMENS:  Pelvic washing were sent to pathology as one specimen, and the bilateral tubes and ovaries were sent as a second specimen.    DESCRIPTION OF PROCEDURE:  The patient was reidentified in the preoperative hold area.  She received Ancef 2 grams IV for antibiotic prophylaxis.  She received TED hose and PAS stockings for DVT prophylaxis.  The patient was transferred to the operating room where she was placed in the dorsal lithotomy position with Allen stirrups.  General  endotracheal anesthesia was induced.  The Trendgard was used to support the patient on the OR table.   An examination under anesthesia was performed.  The patient's lower abdomen, vagina and perineum were then sterilely prepped and she was draped.  A Foley catheter was sterilely placed inside the bladder and left to gravity drainage throughout the procedure and was removed at the termination of the procedure.  A sponge on a stick was placed in the vagina for surgery, and was removed at the end of the case.  Attention was turned to the abdomen where the umbilical region was injected with 0.25% Marcaine and a small incision created.  Penetrating towel clips were used to raise the anterior abdominal wall.  A Veress needle was then used to insufflate the abdomen with CO2 gas after a saline drop test was performed and the fluid flowed freely.   A 5 mm camera port was then placed using the Optiview. A 10 mm incision was created in the right lower quadrant, and a 5 mm incision was created in the left lower abdomen after the skin was injected locally with 0.25% Marcaine. Respective trocars were then placed under visualization of the laparoscope.     The patient was placed in Trendelenburg position. An inspection of the abdomen and pelvis was performed. The findings are as noted above.   Pelvic washings were performed with the Nezhat suction irrigator and sent to pathology.  The left infundibulopelvic ligament was grasped and the Ligasure was used to cauterize and cut the pedicle.  Dissection continued through the broad ligament to the   level of the uterus.  The proximal fallopian tube and the utero-ovarian ligament were cauterized and cut.  The left tube and ovary were removed from the peritoneal cavity and set aside to be later sent to pathology with the right tube and ovary.    Attention was turned to the patient's right-hand side at this time. The same procedure that was performed on the left tube and  ovary, was repeated on the right tube and ovary.  The right tube and ovary were removed from the peritoneal cavity and sent to pathology.  The pneumoperitoneum was decreased, and hemostasis was good.    The right lower abdominal trocar was removed and the Carter Thomison closure divide was used the place a suture of 0/0 Vicryl to close the fascia.  The laparoscopic was removed.  The pneumoperitoneum was released, and the patient received manual breaths to remove any remaining CO2 gas.  The remaining trocars were removed.   The trocar sites were closed with subcuticular sutures of 4/0 Vicryl.  All incisions were then closed with Dermabond.   The sponge on a stick was removed from the vagina, and the Foley catheter was removed.    The patient was awakened and extubated, and escorted to the recovery room in stable condition.  There were no complications.  All needle, instrument, and sponge counts were correct.    E. Silva, M.D.    

## 2017-01-09 NOTE — Transfer of Care (Signed)
Immediate Anesthesia Transfer of Care Note  Patient: Chelsea Salinas  Procedure(s) Performed: LAPAROSCOPIC SALPINGO OOPHORECTOMY with pelvic washings (Bilateral Abdomen)  Patient Location: PACU  Anesthesia Type:General  Level of Consciousness: awake, alert , oriented and patient cooperative  Airway & Oxygen Therapy: Patient Spontanous Breathing and Patient connected to nasal cannula oxygen  Post-op Assessment: Report given to RN and Post -op Vital signs reviewed and stable  Post vital signs: Reviewed and stable  Last Vitals:  Vitals:   01/09/17 0704  BP: 128/74  Pulse: 77  Resp: 18  Temp: 36.6 C  SpO2: 100%    Last Pain:  Vitals:   01/09/17 0704  TempSrc: Oral      Patients Stated Pain Goal: 5 (21/97/58 8325)  Complications: No apparent anesthesia complications

## 2017-01-09 NOTE — Anesthesia Procedure Notes (Signed)
Procedure Name: Intubation Date/Time: 01/09/2017 9:27 AM Performed by: Wanita Chamberlain, CRNA Pre-anesthesia Checklist: Patient identified, Emergency Drugs available, Suction available, Patient being monitored and Timeout performed Patient Re-evaluated:Patient Re-evaluated prior to induction Oxygen Delivery Method: Circle system utilized Preoxygenation: Pre-oxygenation with 100% oxygen Induction Type: IV induction Ventilation: Mask ventilation without difficulty Laryngoscope Size: Mac and 3 Grade View: Grade I Tube type: Oral Tube size: 7.0 mm Number of attempts: 1 Airway Equipment and Method: Stylet Placement Confirmation: ETT inserted through vocal cords under direct vision,  positive ETCO2 and breath sounds checked- equal and bilateral Secured at: 21 cm Tube secured with: Tape Dental Injury: Teeth and Oropharynx as per pre-operative assessment

## 2017-01-09 NOTE — H&P (Signed)
Office Visit   12/20/2016 [facility]    Chelsea Salinas, Chelsea All, MD  Obstetrics and Gynecology   BRCA1 gene mutation positive in female  Dx   Office Visit   ; Referred by [name], MD  Reason for Visit   Additional Documentation   Vitals:   BP 120/80 (BP Location: Left Arm, Patient Position: Sitting, Cuff Size: Normal)   Pulse 80   Resp 16   Wt 153 lb (69.4 kg)   LMP 02/07/1998 (Approximate)   BMI 25.46 kg/m   BSA 1.78 m   Flowsheets:   MEWS Score,   Anthropometrics     Encounter Info:   Billing Info,   History,   Allergies,   Detailed Report     Salinas Notes   Progress Notes by Chelsea Cobbs, MD at 12/20/2016 10:00 AM   Author: [name], MD Author Type: Physician Filed: 12/21/2016 8:11 AM  Note Status: Signed Cosign: Cosign Not Required Encounter Date: 12/20/2016  Editor: Chelsea Cobbs, MD (Physician)  Prior Versions: 1. [name] CMA (Certified Psychologist, sport and exercise) at 12/20/2016 10:11 AM - Sign at close encounter    GYNECOLOGY  VISIT   HPI: 61 y.o.   Married  Caucasian  female   G1P0010 with Patient's last menstrual period was 02/07/1998 (approximate).   here for surgical consultation.   BRCA positive.  Hx breast cancer and is status post mastectomy.  She wants her tubes and ovaries removed.   Normal pelvic US on 04/05/16. No vaginal bleeding.  CA125 27 on 04/29/16.   Took Anastrazole and had a lot of hot flashes.  Now on Letrozole and doing better.  Had annual physical and had normal blood work.  Had recent visit with oncologist as well.   GYNECOLOGIC HISTORY: Patient's last menstrual period was 02/07/1998 (approximate). Contraception:  Postmenopausal Menopausal hormone therapy:  none Last mammogram:  02-29-16 Density C/distortion Rt.breast &poss.asymmetry;Lt.Br.poss.asymmetry--Diag.MMG &Rt.Br.US revealed mass in Rt.Br.suspicious for malignancy. 03-08-16 Rt.Br.Bx  revealed GRADE II INVASIVE MAMMARY CARCINOMA Last pap smear: 04/29/16 Pap and HR HPV negative                                                 04-17-15 Neg:Neg HR HPV;                                                                                                                                                                          11-08-11 Neg  OB History    Gravida Para Term Preterm AB Living   1       1 0   SAB TAB Ectopic Multiple Live Births                         Patient Active Problem List   Diagnosis Date Noted  . Port-A-Cath in place 11/08/2016  . Anemia due to antineoplastic chemotherapy 06/17/2016  . Breast cancer, right ([place]) 04/12/2016  . Genetic testing 04/08/2016  . BRCA1 positive   . Family history of breast cancer   . Family history of ovarian cancer   . Family history of genetic disease carrier   . 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 breast in female, estrogen receptor positive ([place]) 03/15/2016  . Vitamin D deficiency 05/18/2015  . Hot flashes 05/18/2015  . Hypertension   . Asthma   . Allergy   . Eczema         Past Medical History:  Diagnosis Date  . Abnormal Pap smear of cervix    --in her 20s had colpo and some type of treatment to cervix.  . Allergy   . Asthma    "Salinas the time, throughout the yr"  . 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 Breast cancer, right breast ([place]) 02/2016  . Eczema   . Family history of breast cancer   . Family history of genetic disease carrier    paternal relatives with a BRCA mutation  . Family history of ovarian cancer   . Hypertension   . Pneumonia    2000ish  . STD (sexually transmitted disease)    HSV II         Past Surgical History:  Procedure Laterality Date  . COLONOSCOPY    . DILATION AND CURETTAGE OF UTERUS  2008  . MASTECTOMY Left 04/12/2016   prophylactic total mastectomy  . MASTECTOMY COMPLETE /  SIMPLE W/ SENTINEL NODE BIOPSY Right 04/12/2016   axillary sentinel LNB          Current Outpatient Medications  Medication Sig Dispense Refill  . acetaminophen (TYLENOL) 500 MG tablet Take 500-1,000 mg by mouth every 6 (six) hours as needed for mild pain or headache (depends on pain if takes 1-2 tablets).    Marland Kitchen acyclovir ointment (ZOVIRAX) 5 % Apply 1 application topically every 3 (three) hours. 15 g 1  . aspirin EC 81 MG tablet Take 81 mg by mouth daily.    Marland Kitchen CALCIUM PO Take daily by mouth.    . cetirizine (ZYRTEC) 10 MG tablet Take 10 mg by mouth at bedtime.     . Cholecalciferol (VITAMIN D) 2000 units tablet Take 2,000 Units by mouth daily.    . cloNIDine (CATAPRES) 0.2 MG tablet TAKE 1 TABLET(0.2 MG) BY MOUTH AT BEDTIME 90 tablet 1  . escitalopram (LEXAPRO) 20 MG tablet TAKE 1 TABLET(20 MG) BY MOUTH DAILY 90 tablet 1  . Flaxseed, Linseed, (FLAXSEED OIL PO) Take 1,400 mg by mouth every evening.    . fluticasone (FLONASE ALLERGY RELIEF) 50 MCG/ACT nasal spray Place 1 spray into both nostrils daily.    Marland Kitchen gabapentin (NEURONTIN) 100 MG capsule Take 1-3 capsules (100-300 mg total) by mouth at bedtime. 90 capsule 1  . letrozole (FEMARA) 2.5 MG tablet Take 1 tablet (2.5 mg total) by mouth daily. 30 tablet 1  . losartan (COZAAR) 100 MG tablet Take 1 tablet (100 mg total) by mouth  daily. 90 tablet 1  . magnesium oxide (MAG-OX) 400 MG tablet Take 400 mg by mouth every other day.    . montelukast (SINGULAIR) 10 MG tablet TAKE 1 TABLET(10 MG) BY MOUTH DAILY 90 tablet 1  . Multiple Vitamins-Minerals (MULTIVITAMIN WITH MINERALS) tablet Take 1 tablet by mouth every evening.     . Omega-3 Fatty Acids ([employer] FISH OIL) 1200 MG CAPS Take 1,200 mg by mouth every evening.     . valACYclovir (VALTREX) 500 MG tablet Take 1 tablet (500 mg total) by mouth 2 (two) times daily. Take for 3 days as needed. 30 tablet 1  . venlafaxine XR (EFFEXOR-XR) 75 MG 24 hr capsule TAKE 2 CAPSULES BY MOUTH  EVERY MORNING AND 1 CAPSULE EVERY EVENING 270 capsule 1  . vitamin B-12 (CYANOCOBALAMIN) 100 MCG tablet Take 100 mcg by mouth every other day.      No current facility-administered medications for this visit.      ALLERGIES: Patient has no known allergies.       Family History  Problem Relation Age of Onset  . Polymyalgia rheumatica Mother   . Hypertension Father   . Heart disease Father        Dec 69  . Heart attack Father   . Prostate cancer Father        Dx 62s; deceased 54  . Breast cancer Cousin        Dx 59s; daughter of a paternal uncle  . Ovarian cancer Paternal Aunt        Dx 56; deceased  . Ovarian cancer Cousin        Dx 64s; daughter of a paternal uncle  . Ovarian cancer Cousin        Dx 80; daughter of a paternal uncle  . Ovarian cancer Paternal Aunt        Dx 86s.  BRCA positive  . Ovarian cancer Other        pat grandfather's mother    Social History        Socioeconomic History  . Marital status: Married    Spouse name: Not on file  . Number of children: Not on file  . Years of education: Not on file  . Highest education level: Not on file  Social Needs  . Financial resource strain: Not on file  . Food insecurity - worry: Not on file  . Food insecurity - inability: Not on file  . Transportation needs - medical: Not on file  . Transportation needs - non-medical: Not on file  Occupational History  . Not on file  Tobacco Use  . Smoking status: Former Smoker    Packs/day: 1.00    Years: 25.00    Pack years: 25.00    Last attempt to quit: 02/08/1996    Years since quitting: 20.8  . Smokeless tobacco: Never Used  Substance and Sexual Activity  . Alcohol use: Yes    Alcohol/week: 13.2 oz    Types: 7 Glasses of wine, 15 Standard drinks or equivalent per week  . Drug use: No  . Sexual activity: No    Partners: Male    Birth control/protection: Post-menopausal  Other Topics Concern  . Not on file    Social History Narrative  . Not on file    ROS:  Pertinent items are noted in HPI.  PHYSICAL EXAMINATION:    BP 120/80 (BP Location: Left Arm, Patient Position: Sitting, Cuff Size: Normal)   Pulse 80   Resp 16  Wt 153 lb (69.4 kg)   LMP 02/07/1998 (Approximate)   BMI 25.46 kg/m     General appearance: alert, cooperative and appears stated age Head: Normocephalic, without obvious abnormality, atraumatic Neck: no adenopathy, supple, symmetrical, trachea midline and thyroid normal to inspection and palpation Lungs: clear to auscultation bilaterally Heart: regular rate and rhythm Abdomen: soft, non-tender, no masses,  no organomegaly Extremities: extremities normal, atraumatic, no cyanosis or edema Skin: Skin color, texture, turgor normal. No rashes or lesions Lymph nodes: Cervical, supraclavicular, and axillary nodes normal. No abnormal inguinal nodes palpated Neurologic: Grossly normal  Pelvic: External genitalia:  no lesions              Urethra:  normal appearing urethra with no masses, tenderness or lesions              Bartholins and Skenes: normal                 Vagina: normal appearing vagina with normal color and discharge, no lesions              Cervix: no lesions                Bimanual Exam:  Uterus:  normal size, contour, position, consistency, mobility, non-tender              Adnexa: no mass, fullness, tenderness         Chaperone was present for exam.  ASSESSMENT  BRCA1 positive.  Hx breast cancer.  Status post bilateral mastectomy.   PLAN  Discussed laparoscopic BSO with collection of pelvic washings.  Risks and benefits of surgery reviewed. Risks of laparoscopy include but are not limited to bleeding, infection, damage to surrounding organs, reaction to anesthesia, pneumonia, DVT, PE, death, hernia formation, need for further surgery including surgical staging if a hidden cancer were identified, and laparotomy.  Will check CA125.  If it is  elevated, she will need referral to [facility].  An After Visit Summary was printed and given to the patient.  _15_____ minutes face to face time of which over 50% was spent in counseling.

## 2017-01-09 NOTE — Anesthesia Preprocedure Evaluation (Addendum)
Anesthesia Evaluation  Patient identified by MRN, date of birth, ID band Patient awake    Reviewed: Allergy & Precautions, NPO status , Patient's Chart, lab work & pertinent test results  Airway Mallampati: II  TM Distance: >3 FB Neck ROM: Full    Dental no notable dental hx. (+) Teeth Intact, Dental Advisory Given, Chipped,    Pulmonary asthma , former smoker,    Pulmonary exam normal breath sounds clear to auscultation       Cardiovascular hypertension, Normal cardiovascular exam Rhythm:Regular Rate:Normal     Neuro/Psych negative neurological ROS  negative psych ROS   GI/Hepatic negative GI ROS, Neg liver ROS,   Endo/Other  negative endocrine ROS  Renal/GU negative Renal ROS  negative genitourinary   Musculoskeletal negative musculoskeletal ROS (+)   Abdominal   Peds negative pediatric ROS (+)  Hematology negative hematology ROS (+)   Anesthesia Other Findings   Reproductive/Obstetrics negative OB ROS                            Anesthesia Physical Anesthesia Plan  ASA: II  Anesthesia Plan: General   Post-op Pain Management:    Induction: Intravenous  PONV Risk Score and Plan: 3 and Ondansetron, Dexamethasone, Midazolam and Treatment may vary due to age or medical condition  Airway Management Planned: Oral ETT  Additional Equipment:   Intra-op Plan:   Post-operative Plan: Extubation in OR  Informed Consent: I have reviewed the patients History and Physical, chart, labs and discussed the procedure including the risks, benefits and alternatives for the proposed anesthesia with the patient or authorized representative who has indicated his/her understanding and acceptance.   Dental advisory given  Plan Discussed with: CRNA and Surgeon  Anesthesia Plan Comments:         Anesthesia Quick Evaluation

## 2017-01-09 NOTE — Discharge Instructions (Signed)
Post Anesthesia Home Care Instructions  Activity: Get plenty of rest for the remainder of the day. A responsible individual must stay with you for 24 hours following the procedure.  For the next 24 hours, DO NOT: -Drive a car -Paediatric nurse -Drink alcoholic beverages -Take any medication unless instructed by your physician -Make any legal decisions or sign important papers.  Meals: Start with liquid foods such as gelatin or soup. Progress to regular foods as tolerated. Avoid greasy, spicy, heavy foods. If nausea and/or vomiting occur, drink only clear liquids until the nausea and/or vomiting subsides. Call your physician if vomiting continues.  Special Instructions/Symptoms: Your throat may feel dry or sore from the anesthesia or the breathing tube placed in your throat during surgery. If this causes discomfort, gargle with warm salt water. The discomfort should disappear within 24 hours.  Diagnostic Laparoscopy, Care After Refer to this sheet in the next few weeks. These instructions provide you with information about caring for yourself after your procedure. Your health care provider may also give you more specific instructions. Your treatment has been planned according to current medical practices, but problems sometimes occur. Call your health care provider if you have any problems or questions after your procedure. What can I expect after the procedure? After your procedure, it is common to have mild discomfort in the throat and abdomen. Follow these instructions at home:  Take over-the-counter and prescription medicines only as told by your health care provider.  Do not drive for 24 hours if you received a sedative.  Return to your normal activities as told by your health care provider.  Do not take baths, swim, or use a hot tub until your health care provider approves. You may shower.  Follow instructions from your health care provider about how to take care of your  incision. Make sure you: ? Wash your hands with soap and water before you change your bandage (dressing). If soap and water are not available, use hand sanitizer. ? Change your dressing as told by your health care provider. ? Leave stitches (sutures), skin glue, or adhesive strips in place. These skin closures may need to stay in place for 2 weeks or longer. If adhesive strip edges start to loosen and curl up, you may trim the loose edges. Do not remove adhesive strips completely unless your health care provider tells you to do that.  Check your incision area every day for signs of infection. Check for: ? More redness, swelling, or pain. ? More fluid or blood. ? Warmth. ? Pus or a bad smell.  It is your responsibility to get the results of your procedure. Ask your health care provider or the department performing the procedure when your results will be ready. Contact a health care provider if:  There is new pain in your shoulders.  You feel light-headed or faint.  You are unable to pass gas or unable to have a bowel movement.  You feel nauseous or you vomit.  You develop a rash.  You have more redness, swelling, or pain around your incision.  You have more fluid or blood coming from your incision.  Your incision feels warm to the touch.  You have pus or a bad smell coming from your incision.  You have a fever or chills. Get help right away if:  Your pain is getting worse.  You have ongoing vomiting.  The edges of your incision open up.  You have trouble breathing.  You have chest pain. This  information is not intended to replace advice given to you by your health care provider. Make sure you discuss any questions you have with your health care provider. Document Released: 01/05/2015 Document Revised: 07/02/2015 Document Reviewed: 10/07/2014 Elsevier Interactive Patient Education  2018 [employer].

## 2017-01-10 ENCOUNTER — Encounter (HOSPITAL_BASED_OUTPATIENT_CLINIC_OR_DEPARTMENT_OTHER): Payer: Self-pay | Admitting: Obstetrics and Gynecology

## 2017-01-12 ENCOUNTER — Telehealth: Payer: Self-pay

## 2017-01-12 NOTE — Telephone Encounter (Signed)
Spoke with patient. Results given. Patient verbalizes understanding. Is feeling well post operatively. No questions or concerns. Encounter closed.

## 2017-01-12 NOTE — Telephone Encounter (Signed)
-----   Message from [name], MD sent at 01/12/2017 10:54 AM EST ----- Please report to patient final surgical pathology results showing: - tubes and ovaries are normal.  No malignant cells seen.  - peritoneal washings are normal.  No malignant cells seen.   I hope she is recovering well from her laparoscopic BSO!

## 2017-01-14 ENCOUNTER — Other Ambulatory Visit: Payer: Self-pay | Admitting: Hematology

## 2017-01-17 ENCOUNTER — Other Ambulatory Visit: Payer: Self-pay | Admitting: *Deleted

## 2017-01-17 MED ORDER — LETROZOLE 2.5 MG PO TABS: 2.5000 mg | ORAL_TABLET | Freq: Every morning | ORAL | 1 refills | Status: DC

## 2017-01-17 MED ORDER — GABAPENTIN 100 MG PO CAPS: 100.0000 mg | ORAL_CAPSULE | Freq: Every day | ORAL | 1 refills | Status: DC

## 2017-01-24 NOTE — Progress Notes (Signed)
[place]  [phone] Fax:([phone]) [phone]  Clinic Follow Up Note   Patient Care Team: [name], MD as PCP - General (Internal Medicine) [name], MD as Consulting Physician (Dermatology) [name], [name], NP as Nurse Practitioner (Hematology and Oncology) Truitt Merle, MD as Consulting Physician (Hematology) [name], MD as Consulting Physician (General Surgery)   Date of Service:  01/25/2017    CHIEF COMPLAINTS:  Follow up right breast cancer  Oncology History   Cancer Staging 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 breast in female, estrogen receptor positive ([place]) Staging form: Breast, AJCC 8th Edition - Clinical stage from 03/08/2016: Stage IA (cT1c, cN0, cM0, G2, ER: Positive, PR: Negative, HER2: Negative) - Signed by Truitt Merle, MD on 03/15/2016 - Pathologic stage from 04/12/2016: Stage IA (pT1c, pN0, cM0, G2, ER: Positive, PR: Negative, HER2: Negative, Oncotype DX score: 49) - Signed by Truitt Merle, MD on 05/05/2016       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 breast in female, estrogen receptor positive ([place])   03/04/2016 Mammogram    Diagnostic bilateral mammogram and right breast ultrasound showed a 1.7 cm mass in the 12:00 position of the right breast, highly suspicious for malignancy. There is a borderline abnormal right axillary lymph node, also suspicious for metastasis.       03/08/2016 Initial Diagnosis    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 breast in female, estrogen receptor positive ([place])      03/08/2016 Initial Biopsy    Right breast 12:00 core needle biopsy showed invasive ductal carcinoma, grade 2, right axillary lymph node biopsy was negative.      03/08/2016 Receptors her2    ER 20% positive, PR negative, HER-2 negative, Ki-67 20%      03/30/2016 Imaging    MR Bilateral Breast 03/30/2016 IMPRESSION: Known unifocal right breast malignancy. No MRI evidence of malignancy in  the left breast.      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  Genes Analyzed: 43 genes on [facility]'s Common Cancers panel (APC, ATM, AXIN2, BARD1, BMPR1A, BRCA1, BRCA2, BRIP1, CDH1, CDKN2A, CHEK2, DICER1, EPCAM, GREM1, HOXB13, KIT, MEN1, MLH1, MSH2, MSH6, MUTYH, NBN, NF1, PALB2, PDGFRA, PMS2, POLD1, POLE, PTEN, RAD50, RAD51C, RAD51D, SDHA, SDHB, SDHC, SDHD, SMAD4, SMARCA4, STK11, TP53, TSC1, TSC2, VHL).       04/12/2016 Surgery    Bilateral mastectomy and right sentinel lymph node biopsy, by [name]      04/12/2016 Pathology Results    Right breast mastectomy showed invasive grade 2 invasive ductal carcinoma, 1.7 cm, margins were negative, 3 sentinel lymph nodes were negative, left simple mastectomy fibroadenoma, one benign node, no atypia or tumor.       05/04/2016 Oncotype testing    Recurrence score 49, high-risk, predicts 10 year risk of distant recurrence 32% with tamoxifen alone.      05/10/2016 Echocardiogram    Echo on 05/10/16 showed EF 55-60%.      05/20/2016 - 09/29/2016 Chemotherapy    dose dense Adriamycin and Cytoxan, every 2 weeks starting 05/20/16 for 4 cycles, followed by Taxol weekly (Taxol Changed to Abaraxane 176m/m2 on 09/01/17 due to skin rash).       10/2016 -  Anti-estrogen oral therapy     Anastrozole started in 10/2016 and stopped after 2 weeks due to side effects. Switched to Letrozole 11/30/2016       12/27/2016 Imaging    Bone Density 12/27/16  ASSESSMENT: The BMD  measured at Femur Neck Right is 0.938 g/cm2 with a T-score of -0.7. This patient is considered normal according to [facility]) criteria. Site Region Measured Date Measured Age YA BMD Significant CHANGE T-score DualFemur Neck Right 12/27/2016    61.0         -0.5    0.938 g/cm2 AP Spine  L1-L4      12/27/2016    [age]         0.0     1.200 g/cm2      01/05/2017 Survivorship    Survivorship Clinic with with Gardenia Phlegm, NP        01/09/2017 Surgery    LAPAROSCOPIC SALPINGO OOPHORECTOMY with pelvic washings by Dr. [name] and Dr. [name] 01/09/17  Diagnosis Ovaries and fallopian tubes, bilateral - BENIGN OVARIES AND FALLOPIAN TUBES WITH PARATUBAL CYST - NO MALIGNANCY IDENTIFIED        Breast cancer, right ([place])   04/12/2016 Initial Diagnosis    Breast cancer, right ([place])       HISTORY OF PRESENTING ILLNESS (03/16/16):  Chelsea Salinas 62 y.o. female is here because of recently diagnosed right breast invasive mammary carcinoma. She is accompanied by her husband to our multidisciplinary breast clinic today.  The patient underwent routine screening mammogram on 02/29/2016 and was found to have possible architectural distortion and asymmetry in the right breast. Also found was asymmetry in the left breast.  Accordingly a bilateral diagnostic mammogram and unilateral right breast ultrasound were performed on 03/04/2016. These scans showed a 1.7 cm mass in the 12 o'clock position of the right breast with imaging features highly suspicious for malignancy. Also seen was borderline abnormal right axillary lymph node, suspicious for a metastatic node.  Biopsy of the right breast 12:00 o'clock position on 03/08/2016 revealed invasive mammary carcinoma. The carcinoma appears to be at least grade 2, ER positive, PR negative, HER-2 negative, Ki67 20%. The malignant cells are positive for E-Cadherin, supporting a ductal phenotype. Biopsy of the right axillary lymph node showed no evidence of carcinoma.   The patient reports to the clinic today for follow up and to discuss chemotherapy. She has had surgery about 3 weeks ago and she has been doing well. She started antibiotics yesterday to help with infection that she has recently gotten. She has also started doing PT this week. She decided not to do reconstruction. She has been eating well.    GYN HISTORY  Menarchal:12 LMP: late 35's (hot  flushes since mid 40's)  Contraceptive: 23 HRT: 3-4 years  G0P0:  CURRENT THERAPY: Anastrozole started in 10/2016 and stopped after 2 weeks due to side effects. Switched to Letrozole 11/30/2016  INTERVAL HISTORY:  Chelsea Salinas returns for follow up post BSO. She presents to the clinic today noting she has some soreness from surgery but overall she is doing well. Her pathology was negative for malignancy. She has been on letrozole for 2 months now. She is tolerating this much better with mild hot flashes. She denies joint pain and any other concerns. She does not plan to do breast reconstruction. She notes some numbness and tingling in her axilla but tolerable.    MEDICAL HISTORY:  Past Medical History:  Diagnosis Date  . 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 Breast cancer, right breast ([place]) dx 03/08/2016---  oncolgoist-  [name]   Stage 1A (pT1, pN0, pM0)  Grade 2,  ER+, PR-, HER2 - ;  invasive ductal carcinoma s/p  bilateral mastectomies w/ sln bx's (negative) and completed chemotherapy (05-20-2016 to 09-29-2016)  . Eczema   . Family history of breast cancer   . Family history of genetic disease carrier    paternal relatives with a BRCA mutation  . Family history of ovarian cancer   . History of abnormal cervical Pap smear age 47s  . History of exercise intolerance 06/27/2006   normal  . HSV-2 infection   . Hypertension   . Seasonal and perennial allergic rhinitis   . Wears contact lenses     SURGICAL HISTORY: Past Surgical History:  Procedure Laterality Date  . BREAST BIOPSY Right 04/13/2016   Procedure: Evacuation RIGHT Breast  Hematoma;  Surgeon: [name], MD;  Location: [place];  Service: General;  Laterality: Right;  . COLONOSCOPY    . DILATION AND CURETTAGE OF UTERUS  2008  . LAPAROSCOPIC SALPINGO OOPHERECTOMY Bilateral 01/09/2017   Procedure: LAPAROSCOPIC SALPINGO OOPHORECTOMY with pelvic washings;  Surgeon: [name], MD;  Location[facility];  Service: Gynecology;  Laterality: Bilateral;  . MASTECTOMY W/ SENTINEL NODE BIOPSY Bilateral 04/12/2016   Procedure: BILATERAL TOTAL MASTECTOMIES  WITH RIGHT SENTINEL LYMPH NODE BIOPSY;  Surgeon: [name], MD;  Location: [place];  Service: General;  Laterality: Bilateral;  . PORTACATH PLACEMENT Right 05/19/2016   Procedure: INSERTION PORT-A-CATH WITH [place];  Surgeon: [name], MD;  Location: [place];  Service: General;  Laterality: Right;  . TRANSTHORACIC ECHOCARDIOGRAM  05/10/2016   ef 09-73%, grade 1 diastolic dyfuntion/  trivial TR    SOCIAL HISTORY: Social History   Socioeconomic History  . Marital status: Married    Spouse name: Not on file  . Number of children: Not on file  . Years of education: Not on file  . Highest education level: Not on file  Social Needs  . Financial resource strain: Not on file  . Food insecurity - worry: Not on file  . Food insecurity - inability: Not on file  . Transportation needs - medical: Not on file  . Transportation needs - non-medical: Not on file  Occupational History  . Not on file  Tobacco Use  . Smoking status: Former Smoker    Packs/day: 1.00    Years: 25.00    Pack years: 25.00    Last attempt to quit: 02/08/1996    Years since quitting: 20.9  . Smokeless tobacco: Never Used  Substance and Sexual Activity  . Alcohol use: Yes    Alcohol/week: 8.4 oz    Types: 14 Glasses of wine per week  . Drug use: No  . Sexual activity: No    Partners: Male    Birth control/protection: Post-menopausal  Other Topics Concern  . Not on file  Social History Narrative  . Not on file    FAMILY HISTORY: Family History  Problem Relation Age of Onset  . Polymyalgia rheumatica Mother   . Hypertension Father   . Heart disease Father        Dec 69  . Heart attack Father   . Prostate cancer Father        Dx 61s; deceased 15  . Breast cancer Cousin        Dx 46s; daughter of a paternal  uncle  . Ovarian cancer Paternal Aunt        Dx 87; deceased  . Ovarian cancer Cousin        Dx 58s; daughter of a paternal uncle  .  Ovarian cancer Cousin        Dx 52; daughter of a paternal uncle  . Ovarian cancer Paternal Aunt        Dx 70s.  BRCA positive  . Ovarian cancer Other        pat grandfather's mother    ALLERGIES:  has No Known Allergies.  MEDICATIONS:  Current Outpatient Medications  Medication Sig Dispense Refill  . aspirin EC 81 MG tablet Take 81 mg by mouth daily.    . Calcium-Magnesium-Vitamin D (CALCIUM 1200+D3 PO) Take 1 tablet by mouth every evening.    . cetirizine (ZYRTEC) 10 MG tablet Take 10 mg by mouth at bedtime.     . Cholecalciferol (VITAMIN D) 2000 units tablet Take 2,000 Units by mouth daily.    . cloNIDine (CATAPRES) 0.2 MG tablet TAKE 1 TABLET(0.2 MG) BY MOUTH AT BEDTIME (Patient taking differently: Take 0.2 mg by mouth at bedtime. TAKE 1 TABLET(0.2 MG) BY MOUTH AT BEDTIME) 90 tablet 1  . Cyanocobalamin (B-12) 1000 MCG SUBL Place 1 tablet under the tongue every other day.    . escitalopram (LEXAPRO) 20 MG tablet Take 20 mg by mouth daily.  1  . Flaxseed, Linseed, (FLAXSEED OIL PO) Take 1,400 mg by mouth every evening.    . fluticasone (FLONASE ALLERGY RELIEF) 50 MCG/ACT nasal spray Place 1 spray into both nostrils every morning.     . gabapentin (NEURONTIN) 100 MG capsule Take 1-3 capsules (100-300 mg total) by mouth at bedtime. 90 capsule 1  . ibuprofen (ADVIL,MOTRIN) 800 MG tablet Take 1 tablet (800 mg total) by mouth every 8 (eight) hours as needed. 30 tablet 0  . letrozole (FEMARA) 2.5 MG tablet Take 1 tablet (2.5 mg total) by mouth every morning. 30 tablet 1  . losartan (COZAAR) 100 MG tablet Take 1 tablet (100 mg total) by mouth daily. (Patient taking differently: Take 100 mg by mouth every evening. ) 90 tablet 1  . magnesium oxide (MAG-OX) 400 MG tablet Take 400 mg by mouth every other day.    . montelukast (SINGULAIR) 10 MG tablet TAKE 1  TABLET(10 MG) BY MOUTH DAILY (Patient not taking: Reported on 01/25/2017) 90 tablet 1  . Multiple Vitamins-Minerals (MULTIVITAMIN WITH MINERALS) tablet Take 1 tablet by mouth every evening.     . Omega-3 Fatty Acids ([employer] FISH OIL) 1200 MG CAPS Take 1,200 mg by mouth every evening.     . valACYclovir (VALTREX) 500 MG tablet Take 1 tablet (500 mg total) by mouth 2 (two) times daily. Take for 3 days as needed. (Patient taking differently: Take 500 mg by mouth 2 (two) times daily as needed. Take for 3 days as needed.) 30 tablet 1  . venlafaxine XR (EFFEXOR-XR) 75 MG 24 hr capsule TAKE 2 CAPSULES BY MOUTH EVERY MORNING AND 1 CAPSULE EVERY EVENING (Patient taking differently: Take by mouth 2 (two) times daily. TAKE 2 CAPSULES BY MOUTH EVERY MORNING AND 1 CAPSULE EVERY EVENING) 270 capsule 1  . acyclovir ointment (ZOVIRAX) 5 % Apply 1 application topically every 3 (three) hours. 15 g 1   No current facility-administered medications for this visit.     REVIEW OF SYSTEMS:  Constitutional: Denies fevers, chills or abnormal night sweats  (+) mild hot flashes Eyes: Denies blurriness of vision, double vision or watery eyes Ears, nose, mouth, throat, and face: Denies mucositis or sore throat Respiratory: Denies cough, dyspnea or wheezes Cardiovascular: Denies palpitation, chest discomfort or lower extremity swelling Gastrointestinal:  Denies heartburn  Skin:  Negative  Lymphatics: Denies new lymphadenopathy or easy bruising Neurological:Denies numbness, tingling or new weaknesses BREAST: (+) soreness and numbness near incision site  Behavioral/Psych: Mood is stable, no new changes  All other systems were reviewed with the patient and are negative.  PHYSICAL EXAMINATION: ECOG PERFORMANCE STATUS: 1  Vitals:   01/25/17 1138  BP: 136/81  Pulse: 76  Resp: 18  Temp: 98.1 F (36.7 C)  TempSrc: Oral  SpO2: 100%  Weight: 155 lb 11.2 oz (70.6 kg)  Height: _0  (1.651 m)     GENERAL:alert, no  distress and comfortable SKIN: skin color, texture, turgor are normal, no rashes or significant lesions, Dryness of skin and color changes to nail bed. EYES: normal, conjunctiva are pink and non-injected, sclera clear OROPHARYNX:no exudate, no erythema and lips, buccal mucosa, and tongue normal, no oral thrush NECK: supple, thyroid normal size, non-tender, without nodularity LYMPH:  no palpable lymphadenopathy in the cervical, axillary or inguinal LUNGS: clear to auscultation and percussion with normal breathing effort HEART: regular rate & rhythm and no murmurs and no lower extremity edema ABDOMEN:abdomen soft, non-tender and normal bowel sounds, laposcopic abdominal incisions healed well. Musculoskeletal:no cyanosis of digits and no clubbing  PSYCH: alert & oriented x 3 with fluent speech NEURO: no focal motor/sensory deficits Breasts: Breast inspection showed Status post bilateral mastectomy, surgical incisions have healed well with no discharge or surrounding skin Erythema or nodularity. No palpable adenopathy   LABORATORY DATA:  I have reviewed the data as listed CBC Latest Ref Rng & Units 01/25/2017 01/05/2017 11/29/2016  WBC 3.9 - 10.3 10e3/uL 4.8 5.2 5.1  Hemoglobin 11.6 - 15.9 g/dL 13.3 13.0 13.9  Hematocrit 34.8 - 46.6 % 40.2 39.0 42.1  Platelets 145 - 400 10e3/uL 237 235 253    CMP Latest Ref Rng & Units 01/25/2017 01/05/2017 11/29/2016  Glucose 70 - 140 mg/dl 79 114(H) 78  BUN 7.0 - 26.0 mg/dL 17.9 16 13.8  Creatinine 0.6 - 1.1 mg/dL 0.8 0.67 0.7  Sodium 136 - 145 mEq/L 140 139 141  Potassium 3.5 - 5.1 mEq/L 3.7 3.8 4.1  Chloride 101 - 111 mmol/L - 104 -  CO2 22 - 29 mEq/L _1 Calcium 8.4 - 10.4 mg/dL 9.3 9.3 9.9  Total Protein 6.4 - 8.3 g/dL 7.1 6.9 7.5  Total Bilirubin 0.20 - 1.20 mg/dL 0.54 0.7 0.35  Alkaline Phos 40 - 150 U/L 90 80 99  AST 5 - 34 U/L 20 32 23  ALT 0 - 55 U/L _2 PATHOLOGY  Diagnosis 04/12/16 1. Breast, simple mastectomy, Left  Prophylactic Total - BREAST PARENCHYMA SHOWING FIBROCYSTIC CHANGES WITH APOCRINE METAPLASIA AND FLORID USUAL DUCTAL HYPERPLASIA. - FIBROADENOMA. - ONE BENIGN LYMPH NODE (0/1). - NO ATYPIA OR TUMOR SEEN. - GROSSLY UNREMARKABLE SKIN. 2. Breast, simple mastectomy, Right Total - INVASIVE, GRADE 2 DUCTAL CARCINOMA, SPANNING 1.7 CM IN GREATEST DIMENSION. - MARGINS ARE NEGATIVE. - GROSSLY UNREMARKABLE SKIN. - SEE ONCOLOGY TEMPLATE. 3. Lymph node, sentinel, biopsy, Right Axillary #1 - ONE BENIGN WITH NO TUMOR SEEN (0/1). - SEE COMMENT. 4. Lymph node, sentinel, biopsy, Right - ONE BENIGN WITH NO TUMOR SEEN (0/1). - SEE COMMENT. 5. Lymph node, sentinel, biopsy, Right - ONE BENIGN WITH NO TUMOR SEEN (0/1). - SEE COMMENT.  Diagnosis 03/08/2016 1. Breast, right, needle core biopsy, 12:00 o'clock - INVASIVE MAMMARY CARCINOMA. - SEE COMMENT. 2. Lymph node, needle/core biopsy, right axilla - THERE IS NO EVIDENCE OF CARCINOMA IN 1 OF  1 LYMPH NODE (0/1).   PROCEDURES  ECHO 05/10/16 Study Conclusions - Left ventricle: The cavity size was normal. Systolic function was   normal. The estimated ejection fraction was in the range of 55%   to 60%. Wall motion was normal; there were no regional wall   motion abnormalities. Doppler parameters are consistent with   abnormal left ventricular relaxation (grade 1 diastolic   dysfunction). There was no evidence of elevated ventricular   filling pressure by Doppler parameters. - Aortic valve: Trileaflet; normal thickness leaflets. There was no   regurgitation. - Aortic root: The aortic root was normal in size. - Mitral valve: Structurally normal valve. There was no   regurgitation. - Right ventricle: The cavity size was normal. Wall thickness was   normal. Systolic function was normal. - Tricuspid valve: There was trivial regurgitation. - Pulmonary arteries: Systolic pressure was within the normal   range. - Inferior vena cava: The vessel was normal  in size. - Pericardium, extracardiac: There was no pericardial effusion.   RADIOGRAPHIC STUDIES: I have personally reviewed the radiological images as listed and agreed with the findings in the report. Dg Bone Density  Result Date: 12/27/2016 EXAM: DUAL X-RAY ABSORPTIOMETRY (DXA) FOR BONE MINERAL DENSITY IMPRESSION: Referring Physician:  Truitt Merle PATIENT: Name: Chelsea Salinas, Chelsea Salinas Patient ID: [id number] Birth Date: 1955/11/23 Height: 65.0 in. Sex: Female Measured: 12/27/2016 Weight: 154.5 lbs. Indications: Breast Cancer History, Caucasian, Estrogen Deficient, Letrozole, Postmenopausal Fractures: None Treatments: Calcium (E943.0), Hormone Therapy For Cancer, Vitamin D (E933.5) ASSESSMENT: The BMD measured at Femur Neck Right is 0.938 g/cm2 with a T-score of -0.7. This patient is considered normal according to [facility]) criteria. Site Region Measured Date Measured Age YA BMD Significant CHANGE T-score DualFemur Neck Right 12/27/2016    63.0         -0.7    0.938 g/cm2 AP Spine  L1-L4      12/27/2016    61.0         0.0     1.200 g/cm2 World Health Organization [facility]) criteria for post-menopausal, Caucasian Women: Normal       T-score at or above -1 SD Osteopenia   T-score between -1 and -2.5 SD Osteoporosis T-score at or below -2.5 SD RECOMMENDATION: [place] recommends that FDA-approved medical therapies be considered in postmenopausal women and men age 77 or older with a: 1. Hip or vertebral (clinical or morphometric) fracture. 2. T-score of <-2.5 at the spine or hip. 3. Ten-year fracture probability by FRAX of 3% or greater for hip fracture or 20% or greater for major osteoporotic fracture. All treatment decisions require clinical judgment and consideration of individual patient factors, including patient preferences, co-morbidities, previous drug use, risk factors not captured in the FRAX model (e.g. falls, vitamin D deficiency, increased bone turnover, interval  significant decline in bone density) and possible under - or over-estimation of fracture risk by FRAX. All patients should ensure an adequate intake of dietary calcium (1200 mg/d) and vitamin D (800 IU daily) unless contraindicated. FOLLOW-UP: People with diagnosed cases of osteoporosis or at high risk for fracture should have regular bone mineral density tests. For patients eligible for Medicare, routine testing is allowed once every 2 years. The testing frequency can be increased to one year for patients who have rapidly progressing disease, those who are receiving or discontinuing medical therapy to restore bone mass, or have additional risk factors. I have reviewed this report, and agree with the above findings. [facility]  Electronically Signed   By: Marijo Conception, M.D.   On: 12/27/2016 13:17     MR Bilateral Breast 03/30/2016 IMPRESSION: Known unifocal right breast malignancy. No MRI evidence of malignancy in the left breast.  Bilateral mammogram and [place] 03/04/16 IMPRESSION: 1. 1.7 cm mass in the 12 o'clock position of the right breast with imaging features highly suspicious for malignancy. 2. Borderline abnormal right axillary lymph node, suspicious for a metastatic node.  Screening mammogram 02/29/16 IMPRESSION: Further evaluation is suggested for possible architectural distortion and asymmetry in the right breast.  ASSESSMENT & PLAN:  61 y.o. postmenopausal woman, presented with screening discovered right breast cancer  1. 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 Oncotype RS 49, high risk  - Patient has had bilateral total mastectomy on 04/12/16 and denied reconstruction.  -I previously reviewed her surgical pathology findings with patient and her husband in details. Her surgical margins were negative, 3 sentinel lymph nodes were also negative. - Her oncotype is back at  49. This is fairly high risk disease based on the RS. the estimated 10 year risk of distant recurrence with tamoxifen alone is 32%. I previously, strongly recommend chemotherapy to reduce her risk of cancer recurrence after surgery (by ~50%). -she has completed adjuvant chemotherapy with dose dense Adriamycin and Cytoxan followed by Taxol/abraxane  - Baseline Echo on 05/10/16 showed EF 55-60%. -She was started on Anastrozole, however, this caused her severe hot flashes, night sweats, and diarrhea, so she stopped taking this medication. I have switched her to letrozole in 11/2016, she is tolerating well so far.  -If she does have severe side effects from Letrozole, I'll be okay to stop due to her weak ER positivity and small benefit. -We previously discussed breast cancer surveillance after she completes treatment, Including breast exam every 6-12 months. -She is clinically doing well. Lab reviewed, her CBC and CMP are within normal limits. Her physical exam and was unremarkable. There is no clinical concern for recurrence. -Conitnue letrozole  -F/u in 3 months   2. BRCA1 mutation (+) -Due to her strong family history of ovarian cancer and also positive family history of breast cancer, she underwent genetic testing - Patient is positive for BRCA1 Mutation -We previously discussed the high risk of breast cancer and colon cancer due to the BRCA1 mutation. She is status post bilateral mastectomy. She is agreeable to have BSO, she has discussed with her gynecologist. I have recommended her to have this surgery done after she completes adjuvant chemotherapy. - Her sister has done genetic testing, results pending -She has no children. -She underwent BSO on 01/09/17, pathology negative for malignancy   3. Anemia secondary to chemo -resolved now   4. Bone Health -Last DEXA was 2+ years ago -Due to antiestrogen medication I suggest she use calcium/vit D Supplements  -Her 12/2016 DEXA was normal, will  repeat in 12/2018  Plan:  -Labs reviewed, WNL.  -Continue letrozole  -Lab and f/u in 3 months   All questions were answered. The patient knows to call the clinic with any problems, questions or concerns. I spent 20 minutes counseling the patient face to face. The total time spent in the appointment was 25  minutes and more than 50% was on counseling.  This document serves as a record of services personally performed by Truitt Merle, MD. It was created on her behalf by Joslyn Devon, a trained medical  scribe. The creation of this record is based on the scribe's personal observations and the provider's statements to them.    I have reviewed the above documentation for accuracy and completeness, and I agree with the above.   Truitt Merle  01/25/17

## 2017-01-25 ENCOUNTER — Other Ambulatory Visit (HOSPITAL_BASED_OUTPATIENT_CLINIC_OR_DEPARTMENT_OTHER): Payer: BLUE CROSS/BLUE SHIELD

## 2017-01-25 ENCOUNTER — Other Ambulatory Visit: Payer: Self-pay

## 2017-01-25 ENCOUNTER — Telehealth: Payer: Self-pay | Admitting: Hematology

## 2017-01-25 ENCOUNTER — Ambulatory Visit (INDEPENDENT_AMBULATORY_CARE_PROVIDER_SITE_OTHER): Payer: BLUE CROSS/BLUE SHIELD | Admitting: Obstetrics and Gynecology

## 2017-01-25 ENCOUNTER — Ambulatory Visit: Payer: BLUE CROSS/BLUE SHIELD | Admitting: Obstetrics and Gynecology

## 2017-01-25 ENCOUNTER — Encounter: Payer: Self-pay | Admitting: Obstetrics and Gynecology

## 2017-01-25 ENCOUNTER — Ambulatory Visit (HOSPITAL_BASED_OUTPATIENT_CLINIC_OR_DEPARTMENT_OTHER): Payer: BLUE CROSS/BLUE SHIELD | Admitting: Hematology

## 2017-01-25 VITALS — BP 136/81 | HR 76 | Temp 98.1°F | Resp 18 | Ht 65.0 in | Wt 155.7 lb

## 2017-01-25 VITALS — BP 140/90 | HR 80 | Resp 16 | Wt 156.0 lb

## 2017-01-25 DIAGNOSIS — C50111 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 female breast: Secondary | ICD-10-CM

## 2017-01-25 DIAGNOSIS — Z170 Estrogen receptor positive status [ER+]: Secondary | ICD-10-CM | POA: Diagnosis not present

## 2017-01-25 DIAGNOSIS — Z90722 Acquired absence of ovaries, bilateral: Secondary | ICD-10-CM

## 2017-01-25 LAB — COMPREHENSIVE METABOLIC PANEL
ALT: 18 U/L (ref 0–55)
ANION GAP: 12 meq/L — AB (ref 3–11)
AST: 20 U/L (ref 5–34)
Albumin: 4.2 g/dL (ref 3.5–5.0)
Alkaline Phosphatase: 90 U/L (ref 40–150)
BILIRUBIN TOTAL: 0.54 mg/dL (ref 0.20–1.20)
BUN: 17.9 mg/dL (ref 7.0–26.0)
CALCIUM: 9.3 mg/dL (ref 8.4–10.4)
CHLORIDE: 102 meq/L (ref 98–109)
CO2: 26 meq/L (ref 22–29)
CREATININE: 0.8 mg/dL (ref 0.6–1.1)
Glucose: 79 mg/dl (ref 70–140)
Potassium: 3.7 mEq/L (ref 3.5–5.1)
Sodium: 140 mEq/L (ref 136–145)
TOTAL PROTEIN: 7.1 g/dL (ref 6.4–8.3)

## 2017-01-25 LAB — CBC WITH DIFFERENTIAL/PLATELET
BASO%: 0.7 % (ref 0.0–2.0)
Basophils Absolute: 0 10*3/uL (ref 0.0–0.1)
EOS ABS: 0.1 10*3/uL (ref 0.0–0.5)
EOS%: 2 % (ref 0.0–7.0)
HEMATOCRIT: 40.2 % (ref 34.8–46.6)
HGB: 13.3 g/dL (ref 11.6–15.9)
LYMPH#: 1.2 10*3/uL (ref 0.9–3.3)
LYMPH%: 25.9 % (ref 14.0–49.7)
MCH: 32.2 pg (ref 25.1–34.0)
MCHC: 33.2 g/dL (ref 31.5–36.0)
MCV: 97.1 fL (ref 79.5–101.0)
MONO#: 0.5 10*3/uL (ref 0.1–0.9)
MONO%: 10 % (ref 0.0–14.0)
NEUT#: 2.9 10*3/uL (ref 1.5–6.5)
NEUT%: 61.4 % (ref 38.4–76.8)
PLATELETS: 237 10*3/uL (ref 145–400)
RBC: 4.14 10*6/uL (ref 3.70–5.45)
RDW: 13.5 % (ref 11.2–14.5)
WBC: 4.8 10*3/uL (ref 3.9–10.3)

## 2017-01-25 NOTE — Telephone Encounter (Signed)
Scheduled appt per 12/19 los - patient did not want avs or calendar.

## 2017-01-25 NOTE — Progress Notes (Signed)
GYNECOLOGY  VISIT   HPI: 61 y.o.   Married  Caucasian  female   [id number] with Patient's last menstrual period was 02/07/1998 (approximate).   here for follow up. Patient is 16 days s/p LAPAROSCOPIC SALPINGO OOPHORECTOMY with pelvic washings. Pathology was all negative. The patient has a h/o breast cancer, s/p mastectomy, BRCA positive.  Overall doing well. Still has some discomfort around the right side port site. Normal bowel and bladder function.  GYNECOLOGIC HISTORY: Patient's last menstrual period was 02/07/1998 (approximate). Contraception:postmenopause  Menopausal hormone therapy: none         OB History    Gravida Para Term Preterm AB Living   1       1 0   SAB TAB Ectopic Multiple Live Births                     Patient Active Problem List   Diagnosis Date Noted  . Port-A-Cath in place 11/08/2016  . Anemia due to antineoplastic chemotherapy 06/17/2016  . Breast cancer, right ([place]) 04/12/2016  . Genetic testing 04/08/2016  . BRCA1 positive   . Family history of breast cancer   . Family history of ovarian cancer   . Family history of genetic disease carrier   . 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 breast in female, estrogen receptor positive ([place]) 03/15/2016  . Vitamin D deficiency 05/18/2015  . Hot flashes 05/18/2015  . Hypertension   . Asthma   . Allergy   . Eczema     Past Medical History:  Diagnosis Date  . 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 Breast cancer, right breast ([place]) dx 03/08/2016---  oncolgoist-  [name]   Stage 1A (pT1, pN0, pM0)  Grade 2,  ER+, PR-, HER2 - ;  invasive ductal carcinoma s/p  bilateral mastectomies w/ sln bx's (negative) and completed chemotherapy (05-20-2016 to 09-29-2016)  . Eczema   . Family history of breast cancer   . Family history of genetic disease carrier    paternal relatives with a BRCA mutation  . Family history of ovarian cancer   . History of abnormal cervical Pap smear age  59s  . History of exercise intolerance 06/27/2006   normal  . HSV-2 infection   . Hypertension   . Seasonal and perennial allergic rhinitis   . Wears contact lenses     Past Surgical History:  Procedure Laterality Date  . BREAST BIOPSY Right 04/13/2016   Procedure: Evacuation RIGHT Breast  Hematoma;  Surgeon: [name], MD;  Location: [facility];  Service: General;  Laterality: Right;  . COLONOSCOPY    . DILATION AND CURETTAGE OF UTERUS  2008  . LAPAROSCOPIC SALPINGO OOPHERECTOMY Bilateral 01/09/2017   Procedure: LAPAROSCOPIC SALPINGO OOPHORECTOMY with pelvic washings;  Surgeon: [name], MD;  Location: [facility];  Service: Gynecology;  Laterality: Bilateral;  . MASTECTOMY W/ SENTINEL NODE BIOPSY Bilateral 04/12/2016   Procedure: BILATERAL TOTAL MASTECTOMIES  WITH RIGHT SENTINEL LYMPH NODE BIOPSY;  Surgeon: [name], MD;  Location: [place] [facility];  Service: General;  Laterality: Bilateral;  . PORTACATH PLACEMENT Right 05/19/2016   Procedure: INSERTION PORT-A-CATH WITH [place];  Surgeon: [name], MD;  Location: [place];  Service: General;  Laterality: Right;  . TRANSTHORACIC ECHOCARDIOGRAM  05/10/2016   ef 54-09%, grade 1 diastolic dyfuntion/  trivial TR    Current Outpatient Medications  Medication Sig Dispense Refill  . acyclovir ointment (ZOVIRAX) 5 %  Apply 1 application topically every 3 (three) hours. 15 g 1  . aspirin EC 81 MG tablet Take 81 mg by mouth daily.    . Calcium-Magnesium-Vitamin D (CALCIUM 1200+D3 PO) Take 1 tablet by mouth every evening.    . cetirizine (ZYRTEC) 10 MG tablet Take 10 mg by mouth at bedtime.     . Cholecalciferol (VITAMIN D) 2000 units tablet Take 2,000 Units by mouth daily.    . cloNIDine (CATAPRES) 0.2 MG tablet TAKE 1 TABLET(0.2 MG) BY MOUTH AT BEDTIME (Patient taking differently: Take 0.2 mg by mouth at bedtime. TAKE 1 TABLET(0.2 MG) BY MOUTH AT BEDTIME) 90 tablet 1  . Cyanocobalamin (B-12) 1000 MCG SUBL Place 1  tablet under the tongue every other day.    . escitalopram (LEXAPRO) 20 MG tablet Take 20 mg by mouth daily.  1  . Flaxseed, Linseed, (FLAXSEED OIL PO) Take 1,400 mg by mouth every evening.    . fluticasone (FLONASE ALLERGY RELIEF) 50 MCG/ACT nasal spray Place 1 spray into both nostrils every morning.     . gabapentin (NEURONTIN) 100 MG capsule Take 1-3 capsules (100-300 mg total) by mouth at bedtime. 90 capsule 1  . ibuprofen (ADVIL,MOTRIN) 800 MG tablet Take 1 tablet (800 mg total) by mouth every 8 (eight) hours as needed. 30 tablet 0  . letrozole (FEMARA) 2.5 MG tablet Take 1 tablet (2.5 mg total) by mouth every morning. 30 tablet 1  . losartan (COZAAR) 100 MG tablet Take 1 tablet (100 mg total) by mouth daily. (Patient taking differently: Take 100 mg by mouth every evening. ) 90 tablet 1  . magnesium oxide (MAG-OX) 400 MG tablet Take 400 mg by mouth every other day.    . Multiple Vitamins-Minerals (MULTIVITAMIN WITH MINERALS) tablet Take 1 tablet by mouth every evening.     . Omega-3 Fatty Acids ([employer] FISH OIL) 1200 MG CAPS Take 1,200 mg by mouth every evening.     . valACYclovir (VALTREX) 500 MG tablet Take 1 tablet (500 mg total) by mouth 2 (two) times daily. Take for 3 days as needed. (Patient taking differently: Take 500 mg by mouth 2 (two) times daily as needed. Take for 3 days as needed.) 30 tablet 1  . venlafaxine XR (EFFEXOR-XR) 75 MG 24 hr capsule TAKE 2 CAPSULES BY MOUTH EVERY MORNING AND 1 CAPSULE EVERY EVENING (Patient taking differently: Take by mouth 2 (two) times daily. TAKE 2 CAPSULES BY MOUTH EVERY MORNING AND 1 CAPSULE EVERY EVENING) 270 capsule 1  . montelukast (SINGULAIR) 10 MG tablet TAKE 1 TABLET(10 MG) BY MOUTH DAILY (Patient not taking: Reported on 01/25/2017) 90 tablet 1   No current facility-administered medications for this visit.      ALLERGIES: Patient has no known allergies.  Family History  Problem Relation Age of Onset  . Polymyalgia rheumatica Mother   .  Hypertension Father   . Heart disease Father        Dec 69  . Heart attack Father   . Prostate cancer Father        Dx 67s; deceased 13  . Breast cancer Cousin        Dx 30s; daughter of a paternal uncle  . Ovarian cancer Paternal Aunt        Dx 8; deceased  . Ovarian cancer Cousin        Dx 12s; daughter of a paternal uncle  . Ovarian cancer Cousin        Dx 30; daughter of a paternal  uncle  . Ovarian cancer Paternal Aunt        Dx 27s.  BRCA positive  . Ovarian cancer Other        pat grandfather's mother    Social History   Socioeconomic History  . Marital status: Married    Spouse name: Not on file  . Number of children: Not on file  . Years of education: Not on file  . Highest education level: Not on file  Social Needs  . Financial resource strain: Not on file  . Food insecurity - worry: Not on file  . Food insecurity - inability: Not on file  . Transportation needs - medical: Not on file  . Transportation needs - non-medical: Not on file  Occupational History  . Not on file  Tobacco Use  . Smoking status: Former Smoker    Packs/day: 1.00    Years: 25.00    Pack years: 25.00    Last attempt to quit: 02/08/1996    Years since quitting: 20.9  . Smokeless tobacco: Never Used  Substance and Sexual Activity  . Alcohol use: Yes    Alcohol/week: 8.4 oz    Types: 14 Glasses of wine per week  . Drug use: No  . Sexual activity: No    Partners: Male    Birth control/protection: Post-menopausal  Other Topics Concern  . Not on file  Social History Narrative  . Not on file    Review of Systems  Constitutional: Negative.   HENT: Negative.   Eyes: Negative.   Respiratory: Negative.   Cardiovascular: Negative.   Gastrointestinal: Negative.   Genitourinary: Negative.   Musculoskeletal: Negative.   Skin: Negative.   Neurological: Negative.   Endo/Heme/Allergies: Negative.   Psychiatric/Behavioral: Negative.     PHYSICAL EXAMINATION:    BP 140/90 (BP  Location: Right Arm, Patient Position: Sitting, Cuff Size: Normal)   Pulse 80   Resp 16   Wt 156 lb (70.8 kg)   LMP 02/07/1998 (Approximate)   BMI 25.96 kg/m     General appearance: alert, cooperative and appears stated age Abdomen: soft, non-tender; non distended, no masses,  no organomegaly. Incisions are healing well.    ASSESSMENT Normal post op check. 2 weeks s/p laparoscopic BSO, negative pathology    PLAN Routine f/u   An After Visit Summary was printed and given to the patient.

## 2017-01-26 ENCOUNTER — Encounter: Payer: Self-pay | Admitting: Hematology

## 2017-01-30 ENCOUNTER — Other Ambulatory Visit: Payer: Self-pay | Admitting: Internal Medicine

## 2017-02-16 ENCOUNTER — Other Ambulatory Visit: Payer: Self-pay | Admitting: Hematology

## 2017-02-21 DIAGNOSIS — H40013 Open angle with borderline findings, low risk, bilateral: Secondary | ICD-10-CM | POA: Diagnosis not present

## 2017-03-26 ENCOUNTER — Other Ambulatory Visit: Payer: Self-pay | Admitting: Obstetrics and Gynecology

## 2017-03-27 NOTE — Telephone Encounter (Signed)
Medication refill request: Ibuprofen 800mg  Last AEX:  04/29/16 Next AEX: 05/12/17 Last MMG (if hormonal medication request): 03/30/16 MRI Known unifocal right breast malignancy. No MRI evidence of malignancy in the left breast Refill authorized: please advise

## 2017-03-30 ENCOUNTER — Other Ambulatory Visit: Payer: Self-pay | Admitting: Physician Assistant

## 2017-03-30 ENCOUNTER — Other Ambulatory Visit: Payer: Self-pay | Admitting: Hematology

## 2017-03-30 DIAGNOSIS — Z170 Estrogen receptor positive status [ER+]: Principal | ICD-10-CM

## 2017-03-30 DIAGNOSIS — C50111 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 female breast: Secondary | ICD-10-CM

## 2017-04-03 ENCOUNTER — Other Ambulatory Visit: Payer: Self-pay | Admitting: Hematology

## 2017-04-06 ENCOUNTER — Other Ambulatory Visit: Payer: Self-pay | Admitting: Obstetrics and Gynecology

## 2017-04-06 ENCOUNTER — Other Ambulatory Visit: Payer: Self-pay | Admitting: Physician Assistant

## 2017-04-06 DIAGNOSIS — C50111 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 female breast: Secondary | ICD-10-CM

## 2017-04-06 DIAGNOSIS — Z170 Estrogen receptor positive status [ER+]: Principal | ICD-10-CM

## 2017-04-06 MED ORDER — PROCHLORPERAZINE MALEATE 5 MG PO TABS: ORAL_TABLET | ORAL | 3 refills | Status: DC

## 2017-04-06 NOTE — Progress Notes (Signed)
Future Appointments  Date Time Provider [place]  04/27/2017  1:15 PM [facility] None  04/27/2017  1:45 PM Truitt Merle, MD [facility] None  05/12/2017  3:00 PM [name], MD [place] None  11/23/2017  9:00 AM [name], PA-C [facility] None

## 2017-04-06 NOTE — Telephone Encounter (Signed)
Called patient and left message on voicemail to return my call. 

## 2017-04-06 NOTE — Telephone Encounter (Signed)
Please contact patient about her use of Motrin.  I want to be sure she is not taking this too much.  She should be taking this only as needed and not continuously.  This could give her gastritis or an ulcer.

## 2017-04-06 NOTE — Telephone Encounter (Signed)
Medication refill request: Ibuprofen 800mg  #30 Last AEX:  04-29-16 Next AEX: 05-12-17 Last MMG (if hormonal medication request): 03-30-16 MRI--Dx'd with Rt.Br.Ca Refill authorized: Please advsie

## 2017-04-07 ENCOUNTER — Other Ambulatory Visit: Payer: Self-pay | Admitting: Hematology

## 2017-04-10 NOTE — Telephone Encounter (Signed)
Spoke with patient and she states she did not request this refill and states she is not taking Ibuprofen any longer. Advised I will refuse refill.

## 2017-04-11 ENCOUNTER — Other Ambulatory Visit: Payer: Self-pay | Admitting: Hematology

## 2017-04-15 ENCOUNTER — Other Ambulatory Visit: Payer: Self-pay | Admitting: Hematology

## 2017-04-19 ENCOUNTER — Other Ambulatory Visit: Payer: Self-pay | Admitting: Hematology

## 2017-04-23 ENCOUNTER — Other Ambulatory Visit: Payer: Self-pay | Admitting: Hematology

## 2017-04-24 ENCOUNTER — Other Ambulatory Visit: Payer: Self-pay | Admitting: Hematology

## 2017-04-25 NOTE — Progress Notes (Signed)
[place]  [phone] [phone]  Clinic Follow Up Note   Patient Care Team: [name], MD as PCP - General (Internal Medicine) [name], MD as Consulting Physician (Dermatology) [name], [name], NP as Nurse Practitioner (Hematology and Oncology) Truitt Merle, MD as Consulting Physician (Hematology) [name], MD as Consulting Physician (General Surgery)   Date of Service:  04/27/2017    CHIEF COMPLAINTS:  Follow up right breast cancer  Oncology History   Cancer Staging 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 breast in female, estrogen receptor positive ([place]) Staging form: Breast, AJCC 8th Edition - Clinical stage from 03/08/2016: Stage IA (cT1c, cN0, cM0, G2, ER: Positive, PR: Negative, HER2: Negative) - Signed by Truitt Merle, MD on 03/15/2016 - Pathologic stage from 04/12/2016: Stage IA (pT1c, pN0, cM0, G2, ER: Positive, PR: Negative, HER2: Negative, Oncotype DX score: 49) - Signed by Truitt Merle, MD on 05/05/2016       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 breast in female, estrogen receptor positive ([place])   03/04/2016 Mammogram    Diagnostic bilateral mammogram and right breast ultrasound showed a 1.7 cm mass in the 12:00 position of the right breast, highly suspicious for malignancy. There is a borderline abnormal right axillary lymph node, also suspicious for metastasis.       03/08/2016 Initial Diagnosis    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 breast in female, estrogen receptor positive ([place])      03/08/2016 Initial Biopsy    Right breast 12:00 core needle biopsy showed invasive ductal carcinoma, grade 2, right axillary lymph node biopsy was negative.      03/08/2016 Receptors her2    ER 20% positive, PR negative, HER-2 negative, Ki-67 20%      03/30/2016 Imaging    MR Bilateral Breast 03/30/2016 IMPRESSION: Known unifocal right breast malignancy. No MRI evidence of malignancy in  the left breast.      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  Genes Analyzed: 43 genes on [facility]'s Common Cancers panel (APC, ATM, AXIN2, BARD1, BMPR1A, BRCA1, BRCA2, BRIP1, CDH1, CDKN2A, CHEK2, DICER1, EPCAM, GREM1, HOXB13, KIT, MEN1, MLH1, MSH2, MSH6, MUTYH, NBN, NF1, PALB2, PDGFRA, PMS2, POLD1, POLE, PTEN, RAD50, RAD51C, RAD51D, SDHA, SDHB, SDHC, SDHD, SMAD4, SMARCA4, STK11, TP53, TSC1, TSC2, VHL).       04/12/2016 Surgery    Bilateral mastectomy and right sentinel lymph node biopsy, by [name]      04/12/2016 Pathology Results    Right breast mastectomy showed invasive grade 2 invasive ductal carcinoma, 1.7 cm, margins were negative, 3 sentinel lymph nodes were negative, left simple mastectomy fibroadenoma, one benign node, no atypia or tumor.       05/04/2016 Oncotype testing    Recurrence score 49, high-risk, predicts 10 year risk of distant recurrence 32% with tamoxifen alone.      05/10/2016 Echocardiogram    Echo on 05/10/16 showed EF 55-60%.      05/20/2016 - 09/29/2016 Chemotherapy    dose dense Adriamycin and Cytoxan, every 2 weeks starting 05/20/16 for 4 cycles, followed by Taxol weekly (Taxol Changed to Abaraxane 144m/m2 on 09/01/17 due to skin rash).       10/2016 -  Anti-estrogen oral therapy     Anastrozole started in 10/2016 and stopped after 2 weeks due to side effects. Switched to Letrozole 11/30/2016       12/27/2016 Imaging    Bone Density 12/27/16  ASSESSMENT: The BMD  measured at Femur Neck Right is 0.938 g/cm2 with a T-score of -0.7. This patient is considered normal according to [facility]) criteria. Site Region Measured Date Measured Age YA BMD Significant CHANGE T-score DualFemur Neck Right 12/27/2016    61.0         -0.5    0.938 g/cm2 AP Spine  L1-L4      10/27/2016    [age]         0.0     1.200 g/cm2      01/05/2017 Survivorship    Survivorship Clinic with with Gardenia Phlegm, NP        01/09/2017 Surgery    LAPAROSCOPIC SALPINGO OOPHORECTOMY with pelvic washings by Dr. [name] and Dr. [name] 01/09/17  Diagnosis Ovaries and fallopian tubes, bilateral - BENIGN OVARIES AND FALLOPIAN TUBES WITH PARATUBAL CYST - NO MALIGNANCY IDENTIFIED        Breast cancer, right ([place])   04/12/2016 Initial Diagnosis    Breast cancer, right ([place])       HISTORY OF PRESENTING ILLNESS (03/16/16):  Chelsea Salinas 62 y.o. female is here because of recently diagnosed right breast invasive mammary carcinoma. She is accompanied by her husband to our multidisciplinary breast clinic today.  The patient underwent routine screening mammogram on 02/29/2016 and was found to have possible architectural distortion and asymmetry in the right breast. Also found was asymmetry in the left breast.  Accordingly a bilateral diagnostic mammogram and unilateral right breast ultrasound were performed on 03/04/2016. These scans showed a 1.7 cm mass in the 12 o'clock position of the right breast with imaging features highly suspicious for malignancy. Also seen was borderline abnormal right axillary lymph node, suspicious for a metastatic node.  Biopsy of the right breast 12:00 o'clock position on 03/08/2016 revealed invasive mammary carcinoma. The carcinoma appears to be at least grade 2, ER positive, PR negative, HER-2 negative, Ki67 20%. The malignant cells are positive for E-Cadherin, supporting a ductal phenotype. Biopsy of the right axillary lymph node showed no evidence of carcinoma.   The patient reports to the clinic today for follow up and to discuss chemotherapy. She has had surgery about 3 weeks ago and she has been doing well. She started antibiotics yesterday to help with infection that she has recently gotten. She has also started doing PT this week. She decided not to do reconstruction. She has been eating well.    GYN HISTORY  Menarchal:12 LMP: late 38's (hot  flushes since mid 40's)  Contraceptive: 23 HRT: 3-4 years  G0P0:  CURRENT THERAPY: Anastrozole started in 10/2016 and stopped after 2 weeks due to side effects. Switched to Letrozole 11/30/2016  INTERVAL HISTORY:  Chelsea Salinas returns for follow up of her right breast cancer. She presents to the clinic today with hair growth, and no residual effects from chemo. Her energy is back to normal and remaining very active with yoga, walking and horseback riding. Pt notes she is tolerating Letrozole. She is able to check her BP at home given her BP was elevated at 165/94. She is still on losartan and clonidine. She notes she recovered well from her BSO. She notes her sister was negative for BRCA mutation. Will follow up with [name] in 05/2017.   On review of symptoms, pt notes hot flashes that are overall manageable. She denies need for medical intervention. Pt notes good ROM and denies breast tenderness. Pt denies hip, pelvic or joint pain.  MEDICAL HISTORY:  Past Medical History:  Diagnosis Date  . 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 Breast cancer, right breast ([place]) dx 03/08/2016---  oncolgoist-  [name]   Stage 1A (pT1, pN0, pM0)  Grade 2,  ER+, PR-, HER2 - ;  invasive ductal carcinoma s/p  bilateral mastectomies w/ sln bx's (negative) and completed chemotherapy (05-20-2016 to 09-29-2016)  . Eczema   . Family history of breast cancer   . Family history of genetic disease carrier    paternal relatives with a BRCA mutation  . Family history of ovarian cancer   . History of abnormal cervical Pap smear age 70s  . History of exercise intolerance 06/27/2006   normal  . HSV-2 infection   . Hypertension   . Seasonal and perennial allergic rhinitis   . Wears contact lenses     SURGICAL HISTORY: Past Surgical History:  Procedure Laterality Date  . BREAST BIOPSY Right 04/13/2016   Procedure: Evacuation RIGHT Breast  Hematoma;  Surgeon: [name],  MD;  Location: [place];  Service: General;  Laterality: Right;  . COLONOSCOPY    . DILATION AND CURETTAGE OF UTERUS  2008  . LAPAROSCOPIC SALPINGO OOPHERECTOMY Bilateral 01/09/2017   Procedure: LAPAROSCOPIC SALPINGO OOPHORECTOMY with pelvic washings;  Surgeon: [name], MD;  Location[facility];  Service: Gynecology;  Laterality: Bilateral;  . MASTECTOMY W/ SENTINEL NODE BIOPSY Bilateral 04/12/2016   Procedure: BILATERAL TOTAL MASTECTOMIES  WITH RIGHT SENTINEL LYMPH NODE BIOPSY;  Surgeon: [name], MD;  Location: [place];  Service: General;  Laterality: Bilateral;  . PORTACATH PLACEMENT Right 05/19/2016   Procedure: INSERTION PORT-A-CATH WITH [place];  Surgeon: [name], MD;  Location: [place];  Service: General;  Laterality: Right;  . TRANSTHORACIC ECHOCARDIOGRAM  05/10/2016   ef 48-54%, grade 1 diastolic dyfuntion/  trivial TR    SOCIAL HISTORY: Social History   Socioeconomic History  . Marital status: Married    Spouse name: Not on file  . Number of children: Not on file  . Years of education: Not on file  . Highest education level: Not on file  Occupational History  . Not on file  Social Needs  . Financial resource strain: Not on file  . Food insecurity:    Worry: Not on file    Inability: Not on file  . Transportation needs:    Medical: Not on file    Non-medical: Not on file  Tobacco Use  . Smoking status: Former Smoker    Packs/day: 1.00    Years: 25.00    Pack years: 25.00    Last attempt to quit: 02/08/1996    Years since quitting: 21.2  . Smokeless tobacco: Never Used  Substance and Sexual Activity  . Alcohol use: Yes    Alcohol/week: 8.4 oz    Types: 14 Glasses of wine per week  . Drug use: No  . Sexual activity: Never    Partners: Male    Birth control/protection: Post-menopausal  Lifestyle  . Physical activity:    Days per week: Not on file    Minutes per session: Not on file  . Stress: Not on file  Relationships  .  Social connections:    Talks on phone: Not on file    Gets together: Not on file    Attends religious service: Not on file    Active member of club or organization: Not on file    Attends meetings  of clubs or organizations: Not on file    Relationship status: Not on file  . Intimate partner violence:    Fear of current or ex partner: Not on file    Emotionally abused: Not on file    Physically abused: Not on file    Forced sexual activity: Not on file  Other Topics Concern  . Not on file  Social History Narrative  . Not on file    FAMILY HISTORY: Family History  Problem Relation Age of Onset  . Polymyalgia rheumatica Mother   . Hypertension Father   . Heart disease Father        Dec 69  . Heart attack Father   . Prostate cancer Father        Dx 48s; deceased 21  . Breast cancer Cousin        Dx 106s; daughter of a paternal uncle  . Ovarian cancer Paternal Aunt        Dx 62; deceased  . Ovarian cancer Cousin        Dx 34s; daughter of a paternal uncle  . Ovarian cancer Cousin        Dx 40; daughter of a paternal uncle  . Ovarian cancer Paternal Aunt        Dx 51s.  BRCA positive  . Ovarian cancer Other        pat grandfather's mother    ALLERGIES:  has No Known Allergies.  MEDICATIONS:  Current Outpatient Medications  Medication Sig Dispense Refill  . aspirin EC 81 MG tablet Take 81 mg by mouth daily.    . Calcium-Magnesium-Vitamin D (CALCIUM 1200+D3 PO) Take 1 tablet by mouth every evening.    . cetirizine (ZYRTEC) 10 MG tablet Take 10 mg by mouth at bedtime.     . Cholecalciferol (VITAMIN D) 2000 units tablet Take 2,000 Units by mouth daily.    . cloNIDine (CATAPRES) 0.2 MG tablet TAKE 1 TABLET(0.2 MG) BY MOUTH AT BEDTIME (Patient taking differently: Take 0.2 mg by mouth at bedtime. TAKE 1 TABLET(0.2 MG) BY MOUTH AT BEDTIME) 90 tablet 1  . cyanocobalamin 1000 MCG tablet Take 1,000 mcg by mouth daily.    Marland Kitchen escitalopram (LEXAPRO) 20 MG tablet Take 20 mg by mouth  daily.  1  . Flaxseed, Linseed, (FLAXSEED OIL PO) Take 1,400 mg by mouth every evening.    . fluticasone (FLONASE ALLERGY RELIEF) 50 MCG/ACT nasal spray Place 1 spray into both nostrils every morning.     Marland Kitchen losartan (COZAAR) 100 MG tablet Take 1 tablet (100 mg total) by mouth daily. (Patient taking differently: Take 100 mg by mouth every evening. ) 90 tablet 1  . magnesium oxide (MAG-OX) 400 MG tablet Take 400 mg by mouth every other day.    . montelukast (SINGULAIR) 10 MG tablet TAKE 1 TABLET(10 MG) BY MOUTH DAILY 90 tablet 1  . Multiple Vitamins-Minerals (MULTIVITAMIN WITH MINERALS) tablet Take 1 tablet by mouth every evening.     . Omega-3 Fatty Acids ([employer] FISH OIL) 1200 MG CAPS Take 1,200 mg by mouth every evening.     . venlafaxine XR (EFFEXOR-XR) 75 MG 24 hr capsule TAKE 2 CAPSULES BY MOUTH EVERY MORNING AND TAKE 1 CAPSULE BY MOUTH EVERY EVENING 270 capsule 0  . acyclovir ointment (ZOVIRAX) 5 % Apply 1 application topically every 3 (three) hours. (Patient not taking: Reported on 04/27/2017) 15 g 1  . gabapentin (NEURONTIN) 100 MG capsule Take 3 capsules (300 mg total) by mouth at  bedtime. 90 capsule 1  . letrozole (FEMARA) 2.5 MG tablet Take 1 tablet (2.5 mg total) by mouth every morning. 90 tablet 1  . valACYclovir (VALTREX) 500 MG tablet Take 1 tablet (500 mg total) by mouth 2 (two) times daily. Take for 3 days as needed. (Patient not taking: Reported on 04/27/2017) 30 tablet 1   No current facility-administered medications for this visit.     REVIEW OF SYSTEMS:  Constitutional: Denies fevers, chills or abnormal night sweats  (+) hot flashes, manageable Eyes: Denies blurriness of vision, double vision or watery eyes Ears, nose, mouth, throat, and face: Denies mucositis or sore throat Respiratory: Denies cough, dyspnea or wheezes Cardiovascular: Denies palpitation, chest discomfort or lower extremity swelling Gastrointestinal:  Denies heartburn  Skin:  Negative  Lymphatics: Denies new  lymphadenopathy or easy bruising Neurological:Denies numbness, tingling or new weaknesses Behavioral/Psych: Mood is stable, no new changes  All other systems were reviewed with the patient and are negative.  PHYSICAL EXAMINATION: ECOG PERFORMANCE STATUS: 0  Vitals:   04/27/17 1358  BP: (!) 165/94  Pulse: 70  Resp: 18  Temp: 97.6 F (36.4 C)  TempSrc: Oral  SpO2: 99%  Weight: 154 lb (69.9 kg)  Height: _0  (1.651 m)     GENERAL:alert, no distress and comfortable SKIN: skin color, texture, turgor are normal, no rashes or significant lesions, Dryness of skin and color changes to nail bed. EYES: normal, conjunctiva are pink and non-injected, sclera clear OROPHARYNX:no exudate, no erythema and lips, buccal mucosa, and tongue normal, no oral thrush NECK: supple, thyroid normal size, non-tender, without nodularity LYMPH:  no palpable lymphadenopathy in the cervical, axillary or inguinal LUNGS: clear to auscultation and percussion with normal breathing effort HEART: regular rate & rhythm and no murmurs and no lower extremity edema ABDOMEN:abdomen soft, non-tender and normal bowel sounds, laposcopic abdominal incisions healed well. Musculoskeletal:no cyanosis of digits and no clubbing  PSYCH: alert & oriented x 3 with fluent speech NEURO: no focal motor/sensory deficits Breasts: Breast inspection showed Status post bilateral mastectomy, surgical incisions have healed well with no discharge or surrounding skin Erythema or nodularity. No palpable adenopathy   LABORATORY DATA:  I have reviewed the data as listed CBC Latest Ref Rng & Units 04/27/2017 01/25/2017 01/05/2017  WBC 3.9 - 10.3 K/uL 3.8(L) 4.8 5.2  Hemoglobin 11.6 - 15.9 g/dL 13.4 13.3 13.0  Hematocrit 34.8 - 46.6 % 39.7 40.2 39.0  Platelets 145 - 400 K/uL 228 237 235    CMP Latest Ref Rng & Units 04/27/2017 01/25/2017 01/05/2017  Glucose 70 - 140 mg/dL 85 79 114(H)  BUN 7 - 26 mg/dL 14 17.9 16  Creatinine 0.60 - 1.10 mg/dL  0.75 0.8 0.67  Sodium 136 - 145 mmol/L 139 140 139  Potassium 3.5 - 5.1 mmol/L 4.5 3.7 3.8  Chloride 98 - 109 mmol/L 102 - 104  CO2 22 - 29 mmol/L _1 Calcium 8.4 - 10.4 mg/dL 10.2 9.3 9.3  Total Protein 6.4 - 8.3 g/dL 7.2 7.1 6.9  Total Bilirubin 0.2 - 1.2 mg/dL 0.5 0.54 0.7  Alkaline Phos 40 - 150 U/L 97 90 80  AST 5 - 34 U/L 20 20 32  ALT 0 - 55 U/L _2 PATHOLOGY  Diagnosis 04/12/16 1. Breast, simple mastectomy, Left Prophylactic Total - BREAST PARENCHYMA SHOWING FIBROCYSTIC CHANGES WITH APOCRINE METAPLASIA AND FLORID USUAL DUCTAL HYPERPLASIA. - FIBROADENOMA. - ONE BENIGN LYMPH NODE (0/1). - NO ATYPIA OR TUMOR SEEN. -  GROSSLY UNREMARKABLE SKIN. 2. Breast, simple mastectomy, Right Total - INVASIVE, GRADE 2 DUCTAL CARCINOMA, SPANNING 1.7 CM IN GREATEST DIMENSION. - MARGINS ARE NEGATIVE. - GROSSLY UNREMARKABLE SKIN. - SEE ONCOLOGY TEMPLATE. 3. Lymph node, sentinel, biopsy, Right Axillary #1 - ONE BENIGN WITH NO TUMOR SEEN (0/1). - SEE COMMENT. 4. Lymph node, sentinel, biopsy, Right - ONE BENIGN WITH NO TUMOR SEEN (0/1). - SEE COMMENT. 5. Lymph node, sentinel, biopsy, Right - ONE BENIGN WITH NO TUMOR SEEN (0/1). - SEE COMMENT.  Diagnosis 03/08/2016 1. Breast, right, needle core biopsy, 12:00 o'clock - INVASIVE MAMMARY CARCINOMA. - SEE COMMENT. 2. Lymph node, needle/core biopsy, right axilla - THERE IS NO EVIDENCE OF CARCINOMA IN 1 OF 1 LYMPH NODE (0/1).   PROCEDURES  ECHO 05/10/16 Study Conclusions - Left ventricle: The cavity size was normal. Systolic function was   normal. The estimated ejection fraction was in the range of 55%   to 60%. Wall motion was normal; there were no regional wall   motion abnormalities. Doppler parameters are consistent with   abnormal left ventricular relaxation (grade 1 diastolic   dysfunction). There was no evidence of elevated ventricular   filling pressure by Doppler parameters. - Aortic valve: Trileaflet; normal  thickness leaflets. There was no   regurgitation. - Aortic root: The aortic root was normal in size. - Mitral valve: Structurally normal valve. There was no   regurgitation. - Right ventricle: The cavity size was normal. Wall thickness was   normal. Systolic function was normal. - Tricuspid valve: There was trivial regurgitation. - Pulmonary arteries: Systolic pressure was within the normal   range. - Inferior vena cava: The vessel was normal in size. - Pericardium, extracardiac: There was no pericardial effusion.   RADIOGRAPHIC STUDIES: I have personally reviewed the radiological images as listed and agreed with the findings in the report. No results found.   MR Bilateral Breast 03/30/2016 IMPRESSION: Known unifocal right breast malignancy. No MRI evidence of malignancy in the left breast.  Bilateral mammogram and [place] 03/04/16 IMPRESSION: 1. 1.7 cm mass in the 12 o'clock position of the right breast with imaging features highly suspicious for malignancy. 2. Borderline abnormal right axillary lymph node, suspicious for a metastatic node.  Screening mammogram 02/29/16 IMPRESSION: Further evaluation is suggested for possible architectural distortion and asymmetry in the right breast.  ASSESSMENT & PLAN:  62 y.o. postmenopausal woman, presented with screening discovered right breast cancer  1. 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 Oncotype RS 49, high risk  - Patient has had bilateral total mastectomy on 04/12/16 and denied reconstruction.  -I previously reviewed her surgical pathology findings with patient and her husband in details. Her surgical margins were negative, 3 sentinel lymph nodes were also negative. - Her oncotype is back at 49. This is fairly high risk disease based on the RS. the estimated 10 year risk of distant recurrence with tamoxifen alone is 32%. I  previously, strongly recommend chemotherapy to reduce her risk of cancer recurrence after surgery (by ~50%). -she has completed adjuvant chemotherapy with dose dense Adriamycin and Cytoxan followed by Taxol/abraxane  - Baseline Echo on 05/10/16 showed EF 55-60%. -She was started on Anastrozole, however, this caused her severe hot flashes, night sweats, and diarrhea, so she stopped taking this medication. I have switched her to letrozole in 11/2016, she is tolerating well so far.  -If she does have severe side effects from  Letrozole, I'll be okay to stop due to her weak ER positivity and small benefit.  I would not give tamoxifen due to it's increased risk of endometrial cancer in the setting of BRCA1 mutation. -We previously discussed breast cancer surveillance after she completes treatment, Including breast exam every 6-12 months. -She is clinically doing well. Lab reviewed, her CBC and CMP are within normal limits except borderline low WBC at 3.8. Her physical exam and was unremarkable. There is no clinical concern for recurrence. -Given she has had a b/l mastectomy there is no need for mammograms.  --She is tolerating letrozole well with hot flashes but manageable. Continue letrozole  -F/u in 5 months, she is seeing [name] in a month   2. BRCA1 mutation (+) -Due to her strong family history of ovarian cancer and also positive family history of breast cancer, she underwent genetic testing - Patient is positive for BRCA1 Mutation -We previously discussed the high risk of breast cancer and ovarian cancer due to the BRCA1 mutation. She is status post bilateral mastectomy. She is agreeable to have BSO, she has discussed with her gynecologist. I have recommended her to have this surgery done after she completes adjuvant chemotherapy. -We also discussed slightly increased risk of pancreatic cancer, however she does not have any  family history of pancreatic cancer, does not meet the criteria for  pancreatic cancer screening. -Her sister has done genetic testing, results were negative -She has no children. -She underwent BSO on 01/09/17, pathology negative for malignancy, recovered well.   3. Bone Health -Last DEXA was 2+ years ago -Due to antiestrogen medication I suggested she use calcium/vit D Supplements  -Her 12/2016 DEXA was normal, will repeat in 12/2018 -Continue Calcium and Vitamin D supplements, she is complaint    Plan:  -Lab and f/u in 5 months   -Continue letrozole    All questions were answered. The patient knows to call the clinic with any problems, questions or concerns. I spent 20 minutes counseling the patient face to face. The total time spent in the appointment was 25  minutes and more than 50% was on counseling.  This document serves as a record of services personally performed by Truitt Merle, MD. It was created on her behalf by Joslyn Devon, a trained medical scribe. The creation of this record is based on the scribe's personal observations and the provider's statements to them.    I have reviewed the above documentation for accuracy and completeness, and I agree with the above.  Truitt Merle  04/27/17

## 2017-04-27 ENCOUNTER — Inpatient Hospital Stay: Payer: BLUE CROSS/BLUE SHIELD | Attending: Hematology | Admitting: Hematology

## 2017-04-27 ENCOUNTER — Inpatient Hospital Stay: Payer: BLUE CROSS/BLUE SHIELD

## 2017-04-27 ENCOUNTER — Telehealth: Payer: Self-pay | Admitting: Hematology

## 2017-04-27 ENCOUNTER — Other Ambulatory Visit: Payer: Self-pay | Admitting: Hematology

## 2017-04-27 ENCOUNTER — Encounter: Payer: Self-pay | Admitting: Hematology

## 2017-04-27 VITALS — BP 165/94 | HR 70 | Temp 97.6°F | Resp 18 | Ht 65.0 in | Wt 154.0 lb

## 2017-04-27 DIAGNOSIS — Z1509 Genetic susceptibility to other malignant neoplasm: Secondary | ICD-10-CM

## 2017-04-27 DIAGNOSIS — Z170 Estrogen receptor positive status [ER+]: Secondary | ICD-10-CM | POA: Diagnosis not present

## 2017-04-27 DIAGNOSIS — N951 Menopausal and female climacteric states: Secondary | ICD-10-CM | POA: Insufficient documentation

## 2017-04-27 DIAGNOSIS — C50111 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 female breast: Secondary | ICD-10-CM | POA: Insufficient documentation

## 2017-04-27 DIAGNOSIS — Z1501 Genetic susceptibility to malignant neoplasm of breast: Secondary | ICD-10-CM

## 2017-04-27 DIAGNOSIS — I10 Essential (primary) hypertension: Secondary | ICD-10-CM | POA: Diagnosis not present

## 2017-04-27 DIAGNOSIS — Z23 Encounter for immunization: Secondary | ICD-10-CM | POA: Diagnosis not present

## 2017-04-27 LAB — CBC WITH DIFFERENTIAL/PLATELET
Basophils Absolute: 0 10*3/uL (ref 0.0–0.1)
Basophils Relative: 0 %
Eosinophils Absolute: 0.1 10*3/uL (ref 0.0–0.5)
Eosinophils Relative: 2 %
HEMATOCRIT: 39.7 % (ref 34.8–46.6)
HEMOGLOBIN: 13.4 g/dL (ref 11.6–15.9)
Lymphocytes Relative: 36 %
Lymphs Abs: 1.4 10*3/uL (ref 0.9–3.3)
MCH: 33 pg (ref 25.1–34.0)
MCHC: 33.8 g/dL (ref 31.5–36.0)
MCV: 97.8 fL (ref 79.5–101.0)
Monocytes Absolute: 0.4 10*3/uL (ref 0.1–0.9)
Monocytes Relative: 10 %
NEUTROS ABS: 2 10*3/uL (ref 1.5–6.5)
NEUTROS PCT: 52 %
Platelets: 228 10*3/uL (ref 145–400)
RBC: 4.06 MIL/uL (ref 3.70–5.45)
RDW: 12.6 % (ref 11.2–14.5)
WBC: 3.8 10*3/uL — AB (ref 3.9–10.3)

## 2017-04-27 LAB — COMPREHENSIVE METABOLIC PANEL
ALT: 17 U/L (ref 0–55)
ANION GAP: 9 (ref 3–11)
AST: 20 U/L (ref 5–34)
Albumin: 4.3 g/dL (ref 3.5–5.0)
Alkaline Phosphatase: 97 U/L (ref 40–150)
BUN: 14 mg/dL (ref 7–26)
CHLORIDE: 102 mmol/L (ref 98–109)
CO2: 28 mmol/L (ref 22–29)
Calcium: 10.2 mg/dL (ref 8.4–10.4)
Creatinine, Ser: 0.75 mg/dL (ref 0.60–1.10)
Glucose, Bld: 85 mg/dL (ref 70–140)
POTASSIUM: 4.5 mmol/L (ref 3.5–5.1)
Sodium: 139 mmol/L (ref 136–145)
Total Bilirubin: 0.5 mg/dL (ref 0.2–1.2)
Total Protein: 7.2 g/dL (ref 6.4–8.3)

## 2017-04-27 NOTE — Telephone Encounter (Signed)
Spoke with patient re lab/fu 09/28/17. Patient declined printout.

## 2017-05-12 ENCOUNTER — Ambulatory Visit: Payer: BLUE CROSS/BLUE SHIELD | Admitting: Obstetrics and Gynecology

## 2017-05-13 ENCOUNTER — Other Ambulatory Visit: Payer: Self-pay | Admitting: Internal Medicine

## 2017-05-25 ENCOUNTER — Other Ambulatory Visit: Payer: Self-pay | Admitting: Physician Assistant

## 2017-05-25 DIAGNOSIS — T7840XD Allergy, unspecified, subsequent encounter: Secondary | ICD-10-CM

## 2017-05-25 DIAGNOSIS — L309 Dermatitis, unspecified: Secondary | ICD-10-CM

## 2017-05-25 DIAGNOSIS — J45909 Unspecified asthma, uncomplicated: Secondary | ICD-10-CM

## 2017-06-05 DIAGNOSIS — Z1502 Genetic susceptibility to malignant neoplasm of ovary: Secondary | ICD-10-CM | POA: Diagnosis not present

## 2017-06-05 DIAGNOSIS — Z1501 Genetic susceptibility to malignant neoplasm of breast: Secondary | ICD-10-CM | POA: Diagnosis not present

## 2017-06-05 DIAGNOSIS — Z1509 Genetic susceptibility to other malignant neoplasm: Secondary | ICD-10-CM | POA: Diagnosis not present

## 2017-06-05 DIAGNOSIS — C50411 Malignant neoplasm of upper-outer quadrant of right female breast: Secondary | ICD-10-CM | POA: Diagnosis not present

## 2017-06-06 ENCOUNTER — Encounter: Payer: Self-pay | Admitting: Physician Assistant

## 2017-06-27 ENCOUNTER — Other Ambulatory Visit: Payer: Self-pay | Admitting: Hematology

## 2017-06-27 ENCOUNTER — Other Ambulatory Visit: Payer: Self-pay | Admitting: Internal Medicine

## 2017-06-27 DIAGNOSIS — R232 Flushing: Secondary | ICD-10-CM

## 2017-06-27 MED ORDER — CLONIDINE HCL 0.2 MG PO TABS: ORAL_TABLET | ORAL | 3 refills | Status: DC

## 2017-07-08 ENCOUNTER — Other Ambulatory Visit: Payer: Self-pay | Admitting: Internal Medicine

## 2017-07-29 ENCOUNTER — Other Ambulatory Visit: Payer: Self-pay | Admitting: Hematology

## 2017-07-31 ENCOUNTER — Other Ambulatory Visit: Payer: Self-pay | Admitting: Nurse Practitioner

## 2017-07-31 DIAGNOSIS — Z170 Estrogen receptor positive status [ER+]: Principal | ICD-10-CM

## 2017-07-31 DIAGNOSIS — C50111 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 female breast: Secondary | ICD-10-CM

## 2017-07-31 MED ORDER — GABAPENTIN 100 MG PO CAPS: ORAL_CAPSULE | ORAL | 0 refills | Status: DC

## 2017-07-31 MED ORDER — LETROZOLE 2.5 MG PO TABS: 2.5000 mg | ORAL_TABLET | Freq: Every morning | ORAL | 1 refills | Status: DC

## 2017-08-27 ENCOUNTER — Other Ambulatory Visit: Payer: Self-pay | Admitting: Nurse Practitioner

## 2017-08-27 DIAGNOSIS — Z170 Estrogen receptor positive status [ER+]: Principal | ICD-10-CM

## 2017-08-27 DIAGNOSIS — C50111 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 female breast: Secondary | ICD-10-CM

## 2017-09-13 IMAGING — MR MR BILATERAL BREAST WITHOUT AND WITH CONTRAST
8 of 13 series · 33 of 48 positions shown · IV contrast (13ml Multihance)
Comparison: Previous exam(s).

CLINICAL DATA: New diagnosis of right breast cancer in a patient
who is BRCA gene positive.

LABS:  None.
EXAM:
BILATERAL BREAST MRI WITH AND WITHOUT CONTRAST
TECHNIQUE: Multiplanar, multisequence MR images of both breasts were obtained
prior to and following the intravenous administration of 13 ml of
MultiHance.

[Series 2: t2_tirm_tra ipat (a-p) · axial · 3.0mm · 0.70mm/px · 1 of 55 slices shown]
[im 1/55]
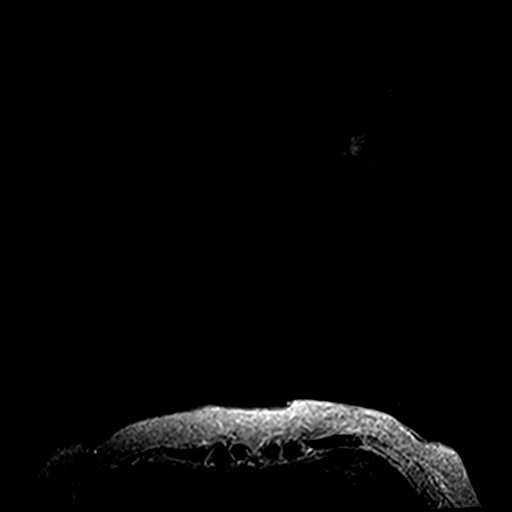

[Series 3: fl3d pre-cm no · axial · non-contrast · 1.2mm · 0.94mm/px · z∈[-65,+107]mm · 5 of 144 slices shown]
[im 1/144]
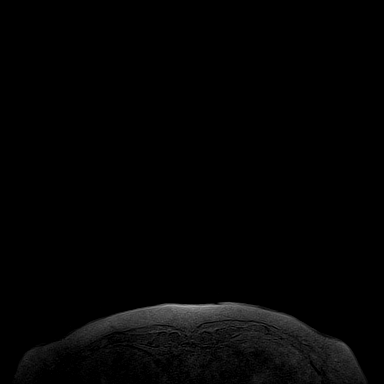
[im 36/144]
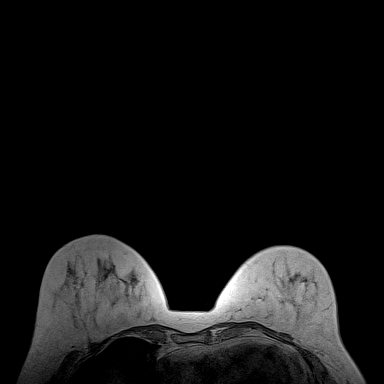
[im 72/144]
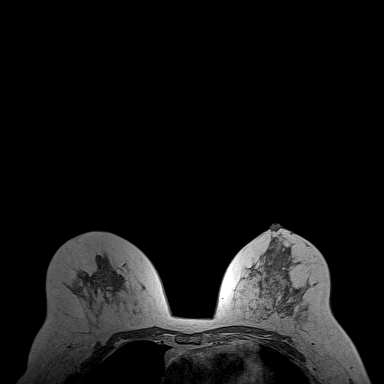
[im 108/144]
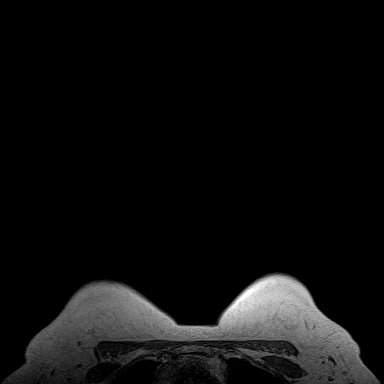
[im 144/144]
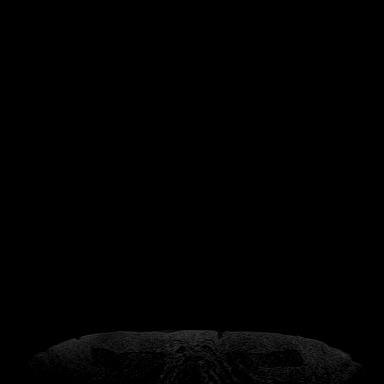

[Series 4: fl3d pre-cm · axial · non-contrast · 1.2mm · 0.94mm/px · z∈[-65,+107]mm · 5 of 144 slices shown]
[im 1/144]
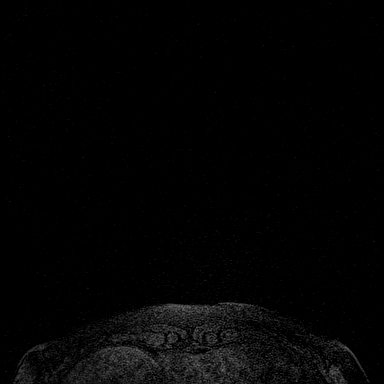
[im 36/144]
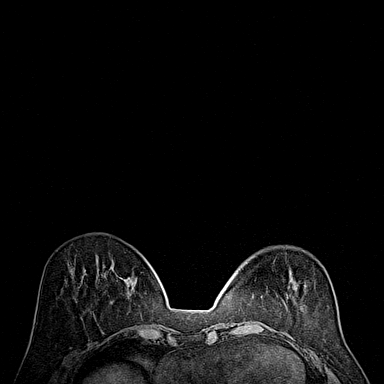
[im 72/144]
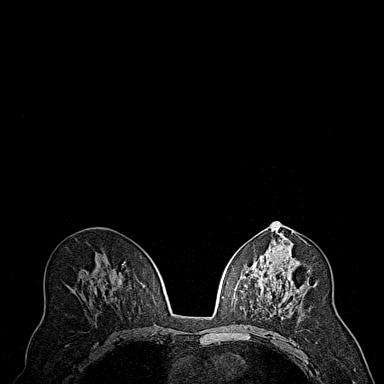
[im 108/144]
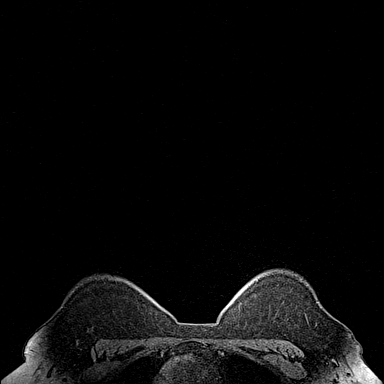
[im 144/144]
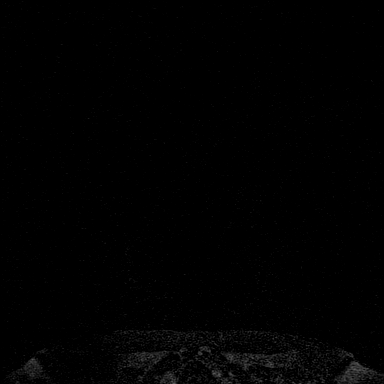

[Series 5: fl3d post-cm 20 · axial · 1.2mm · 0.94mm/px · z∈[-65,+107]mm · 5 of 144 slices shown (1 of 3)]
[im 1/144]
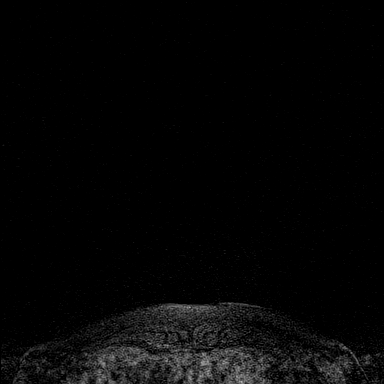
[im 36/144]
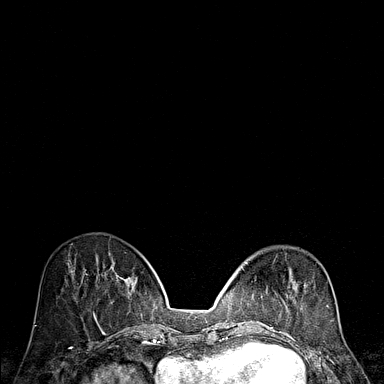
[im 72/144]
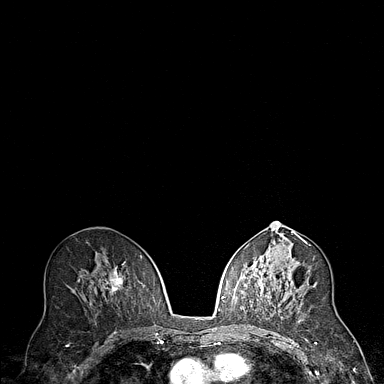
[im 108/144]
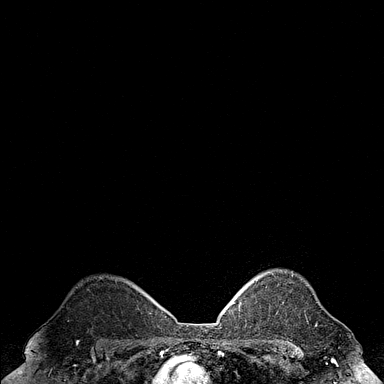
[im 144/144]
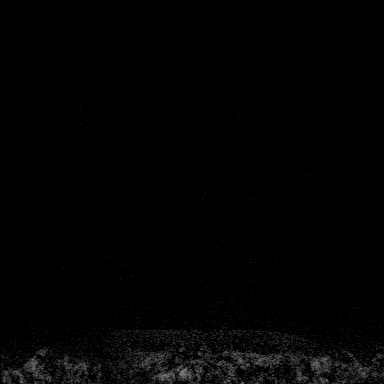

[Series 6: fl3d post-cm 20 · axial · 1.2mm · 0.94mm/px · z∈[-65,+107]mm · 5 of 144 slices shown (2 of 3)]
[im 1/144]
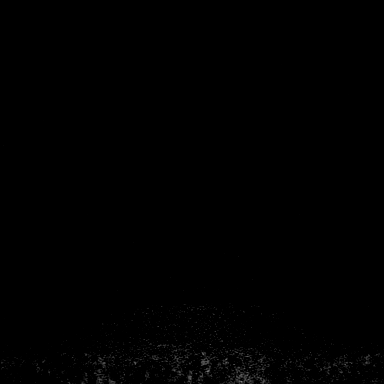
[im 36/144]
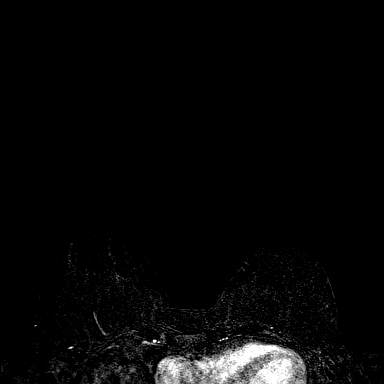
[im 72/144]
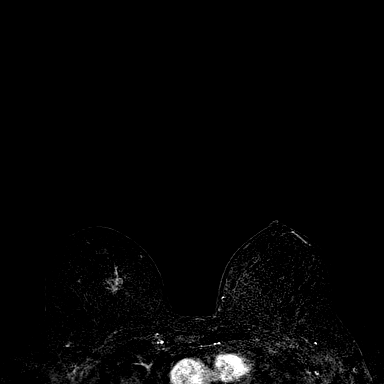
[im 108/144]
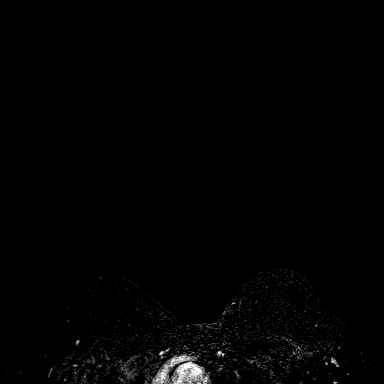
[im 144/144]
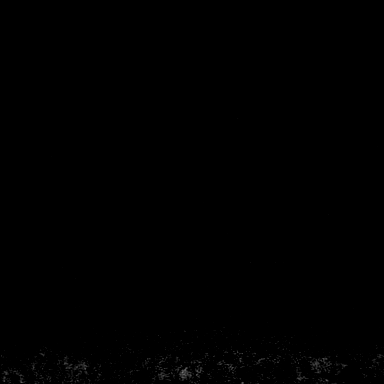

[Series 7: fl3d post-cm 20 · axial · 172.8mm · 0.94mm/px · 1 of 1 slices shown (3 of 3)]
[im 1/1]
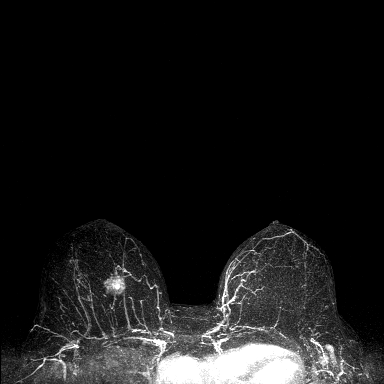

[Series 8: fl3d post-cm 3min · axial · 1.2mm · 0.94mm/px · z∈[-65,+107]mm · 5 of 144 slices shown]
[im 1/144]
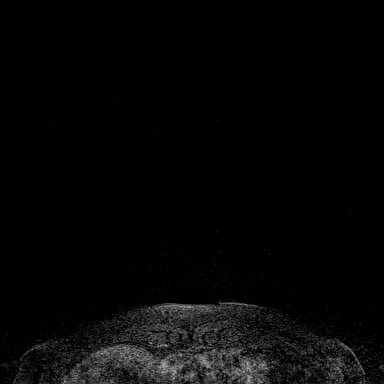
[im 36/144]
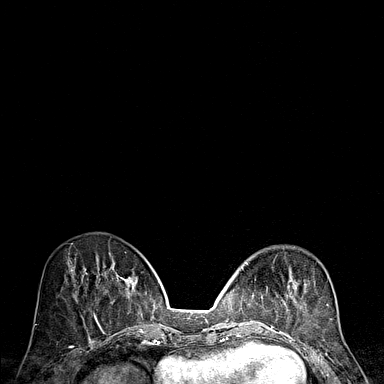
[im 72/144]
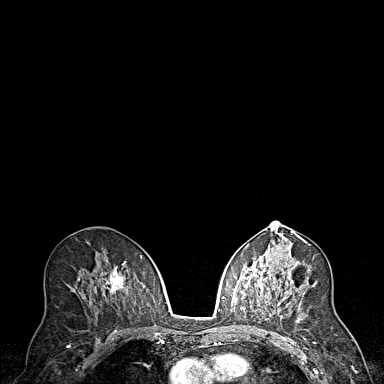
[im 108/144]
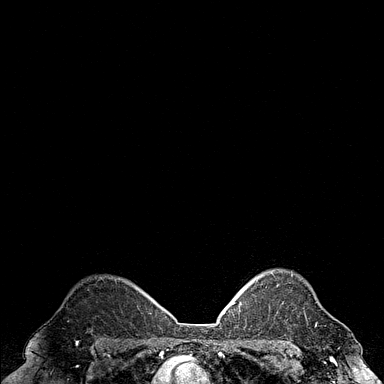
[im 144/144]
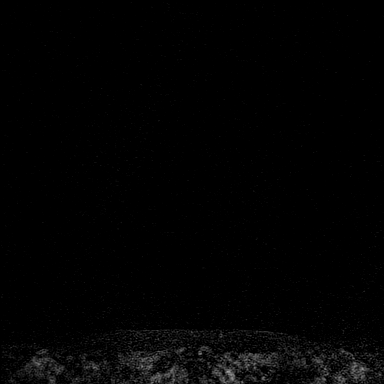

[Series 9: fl3d post-cm 3min_sub · axial · 1.2mm · 0.94mm/px · z∈[-65,+107]mm · 6 of 144 slices shown]
[im 1/144]
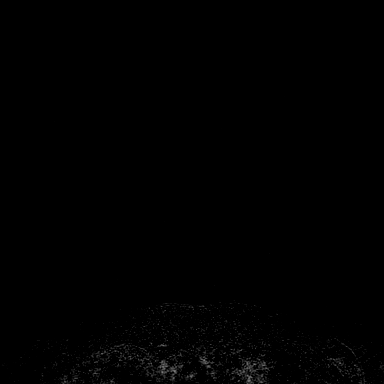
[im 29/144]
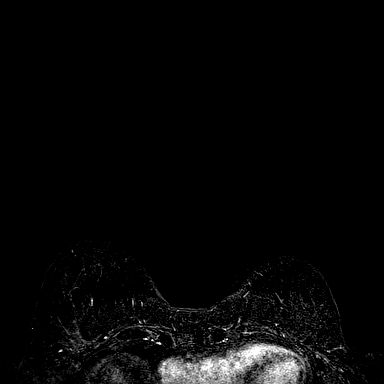
[im 58/144]
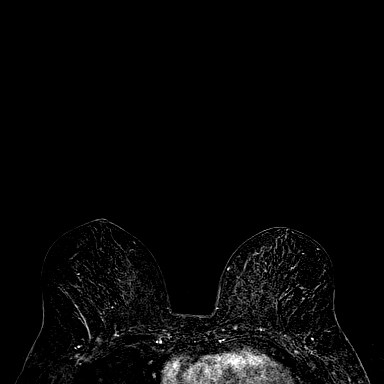
[im 86/144]
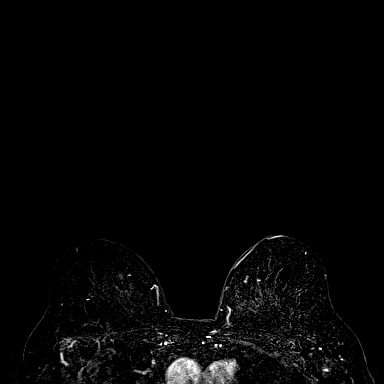
[im 115/144]
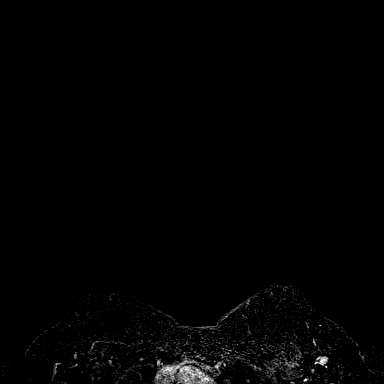
[im 144/144]
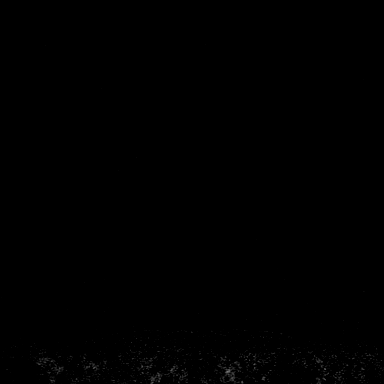

[33 of 48 positions shown; findings below may reference images not displayed]

THREE-DIMENSIONAL MR IMAGE RENDERING ON INDEPENDENT WORKSTATION:

Three-dimensional MR images were rendered by post-processing of the
original MR data on an independent workstation. The
three-dimensional MR images were interpreted, and findings are
reported in the following complete MRI report for this study. Three
dimensional images were evaluated at the independent [employer]
workstation
FINDINGS: Breast composition: c. Heterogeneous fibroglandular tissue.

Background parenchymal enhancement: Moderate.

Right breast: The known right breast malignancy in the upper
slightly inner quadrant, middle depth, measures 1.9 x 2.4 x 2.2 cm
and presents as heterogeneously enhancing mass with plateau and
washout kinetics of enhancement. Post biopsy clip artifact is seen
within it. No other areas of abnormal enhancement within the right
breast.

Left breast: No mass or abnormal enhancement.

Lymph nodes: No abnormal appearing lymph nodes. Signal void infarct
is seen in the right axilla, associated with benign-appearing by MRI
criteria lymph node.

Ancillary findings:  None.
IMPRESSION: Known unifocal right breast malignancy.

No MRI evidence of malignancy in the left breast.

RECOMMENDATION:
Continue with plan of care.

BI-RADS CATEGORY  6: Known biopsy-proven malignancy.

## 2017-09-21 ENCOUNTER — Other Ambulatory Visit: Payer: Self-pay | Admitting: Physician Assistant

## 2017-09-21 DIAGNOSIS — I10 Essential (primary) hypertension: Secondary | ICD-10-CM

## 2017-09-26 NOTE — Progress Notes (Signed)
[place]  [phone] [phone]  Clinic Follow Up Note   Patient Care Team: [name], MD as PCP - General (Internal Medicine) [name], MD as Consulting Physician (Dermatology) [name], [name], NP as Nurse Practitioner (Hematology and Oncology) Truitt Merle, MD as Consulting Physician (Hematology) [name], MD as Consulting Physician (General Surgery)   Date of Service:  09/28/2017    CHIEF COMPLAINTS:  Follow up right breast cancer  Oncology History   Cancer Staging 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 breast in female, estrogen receptor positive ([place]) Staging form: Breast, AJCC 8th Edition - Clinical stage from 03/08/2016: Stage IA (cT1c, cN0, cM0, G2, ER: Positive, PR: Negative, HER2: Negative) - Signed by Truitt Merle, MD on 03/15/2016 - Pathologic stage from 04/12/2016: Stage IA (pT1c, pN0, cM0, G2, ER: Positive, PR: Negative, HER2: Negative, Oncotype DX score: 49) - Signed by Truitt Merle, MD on 05/05/2016       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 breast in female, estrogen receptor positive ([place])   03/04/2016 Mammogram    Diagnostic bilateral mammogram and right breast ultrasound showed a 1.7 cm mass in the 12:00 position of the right breast, highly suspicious for malignancy. There is a borderline abnormal right axillary lymph node, also suspicious for metastasis.     03/08/2016 Initial Diagnosis    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 breast in female, estrogen receptor positive ([place])    03/08/2016 Initial Biopsy    Right breast 12:00 core needle biopsy showed invasive ductal carcinoma, grade 2, right axillary lymph node biopsy was negative.    03/08/2016 Receptors her2    ER 20% positive, PR negative, HER-2 negative, Ki-67 20%    03/30/2016 Imaging    MR Bilateral Breast 03/30/2016 IMPRESSION: Known unifocal right breast malignancy. No MRI evidence of malignancy in the left  breast.    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  Genes Analyzed: 43 genes on [facility]'s Common Cancers panel (APC, ATM, AXIN2, BARD1, BMPR1A, BRCA1, BRCA2, BRIP1, CDH1, CDKN2A, CHEK2, DICER1, EPCAM, GREM1, HOXB13, KIT, MEN1, MLH1, MSH2, MSH6, MUTYH, NBN, NF1, PALB2, PDGFRA, PMS2, POLD1, POLE, PTEN, RAD50, RAD51C, RAD51D, SDHA, SDHB, SDHC, SDHD, SMAD4, SMARCA4, STK11, TP53, TSC1, TSC2, VHL).     04/12/2016 Surgery    Bilateral mastectomy and right sentinel lymph node biopsy, by [name]    04/12/2016 Pathology Results    Right breast mastectomy showed invasive grade 2 invasive ductal carcinoma, 1.7 cm, margins were negative, 3 sentinel lymph nodes were negative, left simple mastectomy fibroadenoma, one benign node, no atypia or tumor.     05/04/2016 Oncotype testing    Recurrence score 49, high-risk, predicts 10 year risk of distant recurrence 32% with tamoxifen alone.    05/10/2016 Echocardiogram    Echo on 05/10/16 showed EF 55-60%.    05/20/2016 - 09/29/2016 Chemotherapy    dose dense Adriamycin and Cytoxan, every 2 weeks starting 05/20/16 for 4 cycles, followed by Taxol weekly (Taxol Changed to Abaraxane 143m/m2 on 09/01/17 due to skin rash).     10/2016 -  Anti-estrogen oral therapy     Anastrozole started in 10/2016 and stopped after 2 weeks due to side effects. Switched to Letrozole 11/30/2016     12/27/2016 Imaging    Bone Density 12/27/16  ASSESSMENT: The BMD measured at Femur Neck Right is 0.938 g/cm2 with a T-score of -0.7. This patient is considered normal according to [facility]  criteria. Site Region Measured Date Measured Age YA BMD Significant CHANGE T-score DualFemur Neck Right 12/27/2016    [age]         -0.5    0.938 g/cm2 AP Spine  L1-L4      12/27/2016    [age]         0.0     1.200 g/cm2    12/27/2016 Imaging    12/27/2016 Bone Density ASSESSMENT: The BMD measured at Femur Neck Right is 0.938  g/cm2 with a T-score of -0.7.    01/05/2017 Survivorship    Survivorship Clinic with with Gardenia Phlegm, NP      01/09/2017 Surgery    LAPAROSCOPIC SALPINGO OOPHORECTOMY with pelvic washings by Dr. [name] and Dr. [name] 01/09/17  Diagnosis Ovaries and fallopian tubes, bilateral - BENIGN OVARIES AND FALLOPIAN TUBES WITH PARATUBAL CYST - NO MALIGNANCY IDENTIFIED      Breast cancer, right ([place])   04/12/2016 Initial Diagnosis    Breast cancer, right ([place])     HISTORY OF PRESENTING ILLNESS (03/16/16):  Chelsea Salinas 62 y.o. female is here because of recently diagnosed right breast invasive mammary carcinoma. She is accompanied by her husband to our multidisciplinary breast clinic today.  The patient underwent routine screening mammogram on 02/29/2016 and was found to have possible architectural distortion and asymmetry in the right breast. Also found was asymmetry in the left breast.  Accordingly a bilateral diagnostic mammogram and unilateral right breast ultrasound were performed on 03/04/2016. These scans showed a 1.7 cm mass in the 12 o'clock position of the right breast with imaging features highly suspicious for malignancy. Also seen was borderline abnormal right axillary lymph node, suspicious for a metastatic node.  Biopsy of the right breast 12:00 o'clock position on 03/08/2016 revealed invasive mammary carcinoma. The carcinoma appears to be at least grade 2, ER positive, PR negative, HER-2 negative, Ki67 20%. The malignant cells are positive for E-Cadherin, supporting a ductal phenotype. Biopsy of the right axillary lymph node showed no evidence of carcinoma.   The patient reports to the clinic today for follow up and to discuss chemotherapy. She has had surgery about 3 weeks ago and she has been doing well. She started antibiotics yesterday to help with infection that she has recently gotten. She has also started doing PT this week. She decided not to do  reconstruction. She has been eating well.    GYN HISTORY  Menarchal:12 LMP: late 40's (hot flushes since mid 40's)  Contraceptive: 23 HRT: 3-4 years  G0P0:  CURRENT THERAPY: Anastrozole started in 10/2016 and stopped after 2 weeks due to side effects. Switched to Letrozole 11/30/2016  INTERVAL HISTORY:  Chelsea Salinas returns for follow up of her right breast cancer. She is here alone. She is feeling well. She reports good appetite and energy levels. She is still having hot flashes that are manageable. She uses Effexor. She is trying to stay active and exercise frequently. She is also trying to maintain a healthy diet. She states that she is still on calcium, but has stopped aspirin, vitamin d and multivitamins.     MEDICAL HISTORY:  Past Medical History:  Diagnosis Date  . 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 Breast cancer, right breast ([place]) dx 03/08/2016---  oncolgoist-  [name]   Stage 1A (pT1, pN0, pM0)  Grade 2,  ER+, PR-, HER2 - ;  invasive ductal carcinoma s/p  bilateral mastectomies w/ sln bx's (  negative) and completed chemotherapy (05-20-2016 to 09-29-2016)  . Eczema   . Family history of breast cancer   . Family history of genetic disease carrier    paternal relatives with a BRCA mutation  . Family history of ovarian cancer   . History of abnormal cervical Pap smear age 60s  . History of exercise intolerance 06/27/2006   normal  . HSV-2 infection   . Hypertension   . Seasonal and perennial allergic rhinitis   . Wears contact lenses     SURGICAL HISTORY: Past Surgical History:  Procedure Laterality Date  . BREAST BIOPSY Right 04/13/2016   Procedure: Evacuation RIGHT Breast  Hematoma;  Surgeon: [name], MD;  Location: [place];  Service: General;  Laterality: Right;  . COLONOSCOPY    . DILATION AND CURETTAGE OF UTERUS  2008  . LAPAROSCOPIC SALPINGO OOPHERECTOMY Bilateral 01/09/2017   Procedure: LAPAROSCOPIC SALPINGO OOPHORECTOMY  with pelvic washings;  Surgeon: [name], MD;  [facility];  Service: Gynecology;  Laterality: Bilateral;  . MASTECTOMY W/ SENTINEL NODE BIOPSY Bilateral 04/12/2016   Procedure: BILATERAL TOTAL MASTECTOMIES  WITH RIGHT SENTINEL LYMPH NODE BIOPSY;  Surgeon: [name], MD;  Location: [place];  Service: General;  Laterality: Bilateral;  . PORTACATH PLACEMENT Right 05/19/2016   Procedure: INSERTION PORT-A-CATH WITH [place];  Surgeon: [name], MD;  Location: [place];  Service: General;  Laterality: Right;  . TRANSTHORACIC ECHOCARDIOGRAM  05/10/2016   ef 98-33%, grade 1 diastolic dyfuntion/  trivial TR    SOCIAL HISTORY: Social History   Socioeconomic History  . Marital status: Married    Spouse name: Not on file  . Number of children: Not on file  . Years of education: Not on file  . Highest education level: Not on file  Occupational History  . Not on file  Social Needs  . Financial resource strain: Not on file  . Food insecurity:    Worry: Not on file    Inability: Not on file  . Transportation needs:    Medical: Not on file    Non-medical: Not on file  Tobacco Use  . Smoking status: Former Smoker    Packs/day: 1.00    Years: 25.00    Pack years: 25.00    Last attempt to quit: 02/08/1996    Years since quitting: 21.6  . Smokeless tobacco: Never Used  Substance and Sexual Activity  . Alcohol use: Yes    Alcohol/week: 14.0 standard drinks    Types: 14 Glasses of wine per week  . Drug use: No  . Sexual activity: Never    Partners: Male    Birth control/protection: Post-menopausal  Lifestyle  . Physical activity:    Days per week: Not on file    Minutes per session: Not on file  . Stress: Not on file  Relationships  . Social connections:    Talks on phone: Not on file    Gets together: Not on file    Attends religious service: Not on file    Active member of club or organization: Not on file    Attends meetings of clubs or  organizations: Not on file    Relationship status: Not on file  . Intimate partner violence:    Fear of current or ex partner: Not on file    Emotionally abused: Not on file    Physically abused: Not on file    Forced sexual activity: Not on file  Other Topics Concern  .  Not on file  Social History Narrative  . Not on file    FAMILY HISTORY: Family History  Problem Relation Age of Onset  . Polymyalgia rheumatica Mother   . Hypertension Father   . Heart disease Father        Dec 69  . Heart attack Father   . Prostate cancer Father        Dx 65s; deceased 9  . Breast cancer Cousin        Dx 9s; daughter of a paternal uncle  . Ovarian cancer Paternal Aunt        Dx 34; deceased  . Ovarian cancer Cousin        Dx 9s; daughter of a paternal uncle  . Ovarian cancer Cousin        Dx 67; daughter of a paternal uncle  . Ovarian cancer Paternal Aunt        Dx 71s.  BRCA positive  . Ovarian cancer Other        pat grandfather's mother    ALLERGIES:  has No Known Allergies.  MEDICATIONS:  Current Outpatient Medications  Medication Sig Dispense Refill  . acyclovir ointment (ZOVIRAX) 5 % Apply 1 application topically every 3 (three) hours. 15 g 1  . Calcium-Magnesium-Vitamin D (CALCIUM 1200+D3 PO) Take 1 tablet by mouth every evening.    . cetirizine (ZYRTEC) 10 MG tablet Take 10 mg by mouth at bedtime.     . cloNIDine (CATAPRES) 0.2 MG tablet TAKE 1 TABLET(0.2 MG) BY MOUTH AT BEDTIME 90 tablet 3  . escitalopram (LEXAPRO) 20 MG tablet Take 20 mg by mouth daily.  1  . fluticasone (FLONASE ALLERGY RELIEF) 50 MCG/ACT nasal spray Place 1 spray into both nostrils every morning.     . gabapentin (NEURONTIN) 100 MG capsule TAKE 3 CAPSULES(300 MG) BY MOUTH AT BEDTIME 90 capsule 0  . letrozole (FEMARA) 2.5 MG tablet Take 1 tablet (2.5 mg total) by mouth every morning. 90 tablet 1  . losartan (COZAAR) 100 MG tablet TAKE 1 TABLET(100 MG) BY MOUTH DAILY 90 tablet 0  . montelukast  (SINGULAIR) 10 MG tablet TAKE 1 TABLET(10 MG) BY MOUTH DAILY 90 tablet 3  . valACYclovir (VALTREX) 500 MG tablet Take 1 tablet (500 mg total) by mouth 2 (two) times daily. Take for 3 days as needed. 30 tablet 1  . venlafaxine XR (EFFEXOR-XR) 75 MG 24 hr capsule TAKE 2 CAPSULES BY MOUTH EVERY MORNING AND TAKE 1 CAPSULE BY MOUTH EVERY EVENING 270 capsule 1   No current facility-administered medications for this visit.     REVIEW OF SYSTEMS:  Constitutional: Denies fevers, chills or abnormal night sweats  (+) hot flashes, manageable Eyes: Denies blurriness of vision, double vision or watery eyes Ears, nose, mouth, throat, and face: Denies mucositis or sore throat Respiratory: Denies cough, dyspnea or wheezes Cardiovascular: Denies palpitation, chest discomfort or lower extremity swelling Gastrointestinal:  Denies heartburn  Skin:  Negative  Lymphatics: Denies new lymphadenopathy or easy bruising Neurological:Denies numbness, tingling or new weaknesses Behavioral/Psych: Mood is stable, no new changes  All other systems were reviewed with the patient and are negative.  PHYSICAL EXAMINATION: ECOG PERFORMANCE STATUS: 0  Vitals:   09/28/17 1409  BP: (!) 157/88  Pulse: 71  Resp: 17  Temp: 98 F (36.7 C)  TempSrc: Oral  SpO2: 99%  Weight: 149 lb 6.4 oz (67.8 kg)  Height: 5' 5"  (1.651 m)     GENERAL:alert, no distress and comfortable SKIN: skin color,  texture, turgor are normal, no rashes or significant lesions, Dryness of skin and color changes to nail bed. EYES: normal, conjunctiva are pink and non-injected, sclera clear OROPHARYNX:no exudate, no erythema and lips, buccal mucosa, and tongue normal, no oral thrush NECK: supple, thyroid normal size, non-tender, without nodularity LYMPH:  no palpable lymphadenopathy in the cervical, axillary or inguinal LUNGS: clear to auscultation and percussion with normal breathing effort HEART: regular rate & rhythm and no murmurs and no lower  extremity edema ABDOMEN:abdomen soft, non-tender and normal bowel sounds, laposcopic abdominal incisions healed well. Musculoskeletal:no cyanosis of digits and no clubbing  PSYCH: alert & oriented x 3 with fluent speech NEURO: no focal motor/sensory deficits Breasts: Breast inspection showed Status post bilateral mastectomy, surgical incisions have healed well with no discharge or surrounding skin Erythema or nodularity. No palpable adenopathy   LABORATORY DATA:  I have reviewed the data as listed CBC Latest Ref Rng & Units 09/28/2017 04/27/2017 01/25/2017  WBC 3.9 - 10.3 K/uL 4.4 3.8(L) 4.8  Hemoglobin 11.6 - 15.9 g/dL 12.9 13.4 13.3  Hematocrit 34.8 - 46.6 % 38.6 39.7 40.2  Platelets 145 - 400 K/uL 195 228 237    CMP Latest Ref Rng & Units 09/28/2017 04/27/2017 01/25/2017  Glucose 70 - 99 mg/dL 94 85 79  BUN 8 - 23 mg/dL 15 14 17.9  Creatinine 0.44 - 1.00 mg/dL 0.69 0.75 0.8  Sodium 135 - 145 mmol/L 141 139 140  Potassium 3.5 - 5.1 mmol/L 4.2 4.5 3.7  Chloride 98 - 111 mmol/L 103 102 -  CO2 22 - 32 mmol/L 28 28 26   Calcium 8.9 - 10.3 mg/dL 9.4 10.2 9.3  Total Protein 6.5 - 8.1 g/dL 6.8 7.2 7.1  Total Bilirubin 0.3 - 1.2 mg/dL 0.3 0.5 0.54  Alkaline Phos 38 - 126 U/L 95 97 90  AST 15 - 41 U/L 19 20 20   ALT 0 - 44 U/L 17 17 18    Tumor Marker CA 125 Results for Chelsea Salinas, Chelsea Salinas (MRN [id number]) as of 09/26/2017 10:43  Ref. Range 04/29/2016 14:59 12/20/2016 12:05  CA 125 Latest Ref Range: <35 U/mL 27   Cancer Antigen (CA) 125 Latest Ref Range: 0.0 - 38.1 U/mL  33.6    PATHOLOGY  Diagnosis 04/12/16 1. Breast, simple mastectomy, Left Prophylactic Total - BREAST PARENCHYMA SHOWING FIBROCYSTIC CHANGES WITH APOCRINE METAPLASIA AND FLORID USUAL DUCTAL HYPERPLASIA. - FIBROADENOMA. - ONE BENIGN LYMPH NODE (0/1). - NO ATYPIA OR TUMOR SEEN. - GROSSLY UNREMARKABLE SKIN. 2. Breast, simple mastectomy, Right Total - INVASIVE, GRADE 2 DUCTAL CARCINOMA, SPANNING 1.7 CM IN GREATEST DIMENSION. -  MARGINS ARE NEGATIVE. - GROSSLY UNREMARKABLE SKIN. - SEE ONCOLOGY TEMPLATE. 3. Lymph node, sentinel, biopsy, Right Axillary #1 - ONE BENIGN WITH NO TUMOR SEEN (0/1). - SEE COMMENT. 4. Lymph node, sentinel, biopsy, Right - ONE BENIGN WITH NO TUMOR SEEN (0/1). - SEE COMMENT. 5. Lymph node, sentinel, biopsy, Right - ONE BENIGN WITH NO TUMOR SEEN (0/1). - SEE COMMENT.  Diagnosis 03/08/2016 1. Breast, right, needle core biopsy, 12:00 o'clock - INVASIVE MAMMARY CARCINOMA. - SEE COMMENT. 2. Lymph node, needle/core biopsy, right axilla - THERE IS NO EVIDENCE OF CARCINOMA IN 1 OF 1 LYMPH NODE (0/1).   PROCEDURES  ECHO 05/10/16 Study Conclusions - Left ventricle: The cavity size was normal. Systolic function was   normal. The estimated ejection fraction was in the range of 55%   to 60%. Wall motion was normal; there were no regional wall   motion abnormalities. Doppler parameters  are consistent with   abnormal left ventricular relaxation (grade 1 diastolic   dysfunction). There was no evidence of elevated ventricular   filling pressure by Doppler parameters. - Aortic valve: Trileaflet; normal thickness leaflets. There was no   regurgitation. - Aortic root: The aortic root was normal in size. - Mitral valve: Structurally normal valve. There was no   regurgitation. - Right ventricle: The cavity size was normal. Wall thickness was   normal. Systolic function was normal. - Tricuspid valve: There was trivial regurgitation. - Pulmonary arteries: Systolic pressure was within the normal   range. - Inferior vena cava: The vessel was normal in size. - Pericardium, extracardiac: There was no pericardial effusion.   RADIOGRAPHIC STUDIES: I have personally reviewed the radiological images as listed and agreed with the findings in the report.  12/27/2017 Bone Density ASSESSMENT: The BMD measured at Femur Neck Right is 0.938 g/cm2 with a T-score of -0.7.   MR Bilateral Breast  03/30/2016 IMPRESSION: Known unifocal right breast malignancy. No MRI evidence of malignancy in the left breast.  Bilateral mammogram and [place] 03/04/16 IMPRESSION: 1. 1.7 cm mass in the 12 o'clock position of the right breast with imaging features highly suspicious for malignancy. 2. Borderline abnormal right axillary lymph node, suspicious for a metastatic node.  Screening mammogram 02/29/16 IMPRESSION: Further evaluation is suggested for possible architectural distortion and asymmetry in the right breast.  ASSESSMENT & PLAN:   62 y.o. postmenopausal woman, presented with screening discovered right breast cancer  1. 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 Oncotype RS 49, high risk  - Patient has had bilateral total mastectomy on 04/12/16 and denied reconstruction.  -I previously reviewed her surgical pathology findings with patient and her husband in details. Her surgical margins were negative, 3 sentinel lymph nodes were also negative. - Her oncotype is back at 49. This is fairly high risk disease based on the RS. the estimated 10 year risk of distant recurrence with tamoxifen alone is 32%. I previously, strongly recommend chemotherapy to reduce her risk of cancer recurrence after surgery (by ~50%). -she has completed adjuvant chemotherapy with dose dense Adriamycin and Cytoxan followed by Taxol/abraxane  - Baseline Echo on 05/10/16 showed EF 55-60%. -She was started on Anastrozole, however, this caused her severe hot flashes, night sweats, and diarrhea, so she stopped taking this medication. I have switched her to letrozole in 11/2016, she is tolerating well so far.  -If she does have severe side effects from Letrozole, I'll be okay to stop due to her weak ER positivity and small benefit.  I would not give tamoxifen due to it's increased risk of endometrial cancer in the setting of BRCA1  mutation. -We previously discussed breast cancer surveillance after she completes treatment, Including breast exam every 6-12 months. -She is clinically doing well. Lab reviewed, her CBC and CMP are within normal limits. Her physical exam and was unremarkable. There is no clinical concern for recurrence. -Given she has had a b/l mastectomy there is no need for mammograms.  --She is tolerating letrozole well with hot flashes but manageable. Continue letrozole  -F/u in 4 months then every 6 months   2. BRCA1 mutation (+) -Due to her strong family history of ovarian cancer and also positive family history of breast cancer, she underwent genetic testing - Patient is positive for BRCA1 Mutation -We previously discussed the high risk of breast cancer and  ovarian cancer due to the BRCA1 mutation. She is status post bilateral mastectomy. She is agreeable to have BSO, she has discussed with her gynecologist. I have recommended her to have this surgery done after she completes adjuvant chemotherapy. -We also discussed slightly increased risk of pancreatic cancer, however she does not have any  family history of pancreatic cancer, does not meet the criteria for pancreatic cancer screening. -Her sister has done genetic testing, results were negative -She has no children. -She underwent BSO on 01/09/17, pathology negative for malignancy, recovered well.   3. Bone Health -We discussed potential osteopenia and osteoporosis from antiestrogen therapy letrozole. -Her 12/2016 DEXA was normal, will repeat in 12/2018 -I encourage her to restart vitamin D for her bone health as she reported stopping it.    Plan:  -Lab and f/u in 4 months   -Continue letrozole    All questions were answered. The patient knows to call the clinic with any problems, questions or concerns. I spent 15 minutes counseling the patient face to face. The total time spent in the appointment was 20  minutes and more than 50% was on  counseling.  Dierdre Searles Dweik am acting as scribe for Dr. Truitt Merle.  I have reviewed the above documentation for accuracy and completeness, and I agree with the above.    Truitt Merle  09/28/17

## 2017-09-28 ENCOUNTER — Telehealth: Payer: Self-pay | Admitting: Hematology

## 2017-09-28 ENCOUNTER — Inpatient Hospital Stay: Payer: BLUE CROSS/BLUE SHIELD

## 2017-09-28 ENCOUNTER — Inpatient Hospital Stay: Payer: BLUE CROSS/BLUE SHIELD | Attending: Hematology | Admitting: Hematology

## 2017-09-28 VITALS — BP 157/88 | HR 71 | Temp 98.0°F | Resp 17 | Ht 65.0 in | Wt 149.4 lb

## 2017-09-28 DIAGNOSIS — C50111 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 female breast: Secondary | ICD-10-CM | POA: Diagnosis not present

## 2017-09-28 DIAGNOSIS — Z170 Estrogen receptor positive status [ER+]: Principal | ICD-10-CM

## 2017-09-28 DIAGNOSIS — Z9013 Acquired absence of bilateral breasts and nipples: Secondary | ICD-10-CM | POA: Insufficient documentation

## 2017-09-28 DIAGNOSIS — N951 Menopausal and female climacteric states: Secondary | ICD-10-CM | POA: Insufficient documentation

## 2017-09-28 DIAGNOSIS — Z1501 Genetic susceptibility to malignant neoplasm of breast: Secondary | ICD-10-CM

## 2017-09-28 DIAGNOSIS — Z87891 Personal history of nicotine dependence: Secondary | ICD-10-CM | POA: Diagnosis not present

## 2017-09-28 DIAGNOSIS — Z1509 Genetic susceptibility to other malignant neoplasm: Secondary | ICD-10-CM

## 2017-09-28 DIAGNOSIS — I10 Essential (primary) hypertension: Secondary | ICD-10-CM

## 2017-09-28 LAB — CBC WITH DIFFERENTIAL/PLATELET
BASOS PCT: 0 %
Basophils Absolute: 0 10*3/uL (ref 0.0–0.1)
Eosinophils Absolute: 0.1 10*3/uL (ref 0.0–0.5)
Eosinophils Relative: 2 %
HEMATOCRIT: 38.6 % (ref 34.8–46.6)
HEMOGLOBIN: 12.9 g/dL (ref 11.6–15.9)
Lymphocytes Relative: 35 %
Lymphs Abs: 1.5 10*3/uL (ref 0.9–3.3)
MCH: 32.9 pg (ref 25.1–34.0)
MCHC: 33.4 g/dL (ref 31.5–36.0)
MCV: 98.5 fL (ref 79.5–101.0)
MONOS PCT: 14 %
Monocytes Absolute: 0.6 10*3/uL (ref 0.1–0.9)
NEUTROS ABS: 2.2 10*3/uL (ref 1.5–6.5)
Neutrophils Relative %: 49 %
Platelets: 195 10*3/uL (ref 145–400)
RBC: 3.92 MIL/uL (ref 3.70–5.45)
RDW: 12.6 % (ref 11.2–14.5)
WBC: 4.4 10*3/uL (ref 3.9–10.3)

## 2017-09-28 LAB — COMPREHENSIVE METABOLIC PANEL
ALBUMIN: 4.2 g/dL (ref 3.5–5.0)
ALK PHOS: 95 U/L (ref 38–126)
ALT: 17 U/L (ref 0–44)
ANION GAP: 10 (ref 5–15)
AST: 19 U/L (ref 15–41)
BILIRUBIN TOTAL: 0.3 mg/dL (ref 0.3–1.2)
BUN: 15 mg/dL (ref 8–23)
CALCIUM: 9.4 mg/dL (ref 8.9–10.3)
CO2: 28 mmol/L (ref 22–32)
Chloride: 103 mmol/L (ref 98–111)
Creatinine, Ser: 0.69 mg/dL (ref 0.44–1.00)
GFR calc Af Amer: >60 mL/min (ref >60)
GFR calc non Af Amer: >60 mL/min (ref >60)
Glucose, Bld: 94 mg/dL (ref 70–99)
POTASSIUM: 4.2 mmol/L (ref 3.5–5.1)
Sodium: 141 mmol/L (ref 135–145)
TOTAL PROTEIN: 6.8 g/dL (ref 6.5–8.1)

## 2017-09-28 MED ORDER — GABAPENTIN 300 MG PO CAPS: 300.0000 mg | ORAL_CAPSULE | Freq: Every day | ORAL | 1 refills | Status: DC

## 2017-09-28 NOTE — Telephone Encounter (Signed)
Appts scheduled AVS/Calendar declined per 8/22 los

## 2017-09-30 ENCOUNTER — Encounter: Payer: Self-pay | Admitting: Hematology

## 2017-10-04 ENCOUNTER — Other Ambulatory Visit: Payer: Self-pay | Admitting: Nurse Practitioner

## 2017-10-04 DIAGNOSIS — Z170 Estrogen receptor positive status [ER+]: Principal | ICD-10-CM

## 2017-10-04 DIAGNOSIS — C50111 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 female breast: Secondary | ICD-10-CM

## 2017-11-20 NOTE — Progress Notes (Signed)
Complete Physical  Assessment and Plan:  Essential hypertension - continue medications, DASH diet, exercise and monitor at home. Call if greater than 130/80.  -     CBC with Differential/Platelet -     BASIC METABOLIC PANEL WITH GFR -     Hepatic function panel -     TSH -     Urinalysis, Routine w reflex microscopic -     Microalbumin / creatinine urine ratio -     losartan (COZAAR) 100 MG tablet; Take 1 tablet (100 mg total) by mouth daily.  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 breast in female, estrogen receptor positive ([place]) Continue follow up  BRCA1 positive Continue follow up  Vitamin D deficiency -     VITAMIN D 25 Hydroxy (Vit-D Deficiency, Fractures)  Allergic state, subsequent encounter -     montelukast (SINGULAIR) 10 MG tablet; TAKE 1 TABLET(10 MG) BY MOUTH DAILY  Uncomplicated asthma, unspecified asthma severity, unspecified whether persistent -     montelukast (SINGULAIR) 10 MG tablet; TAKE 1 TABLET(10 MG) BY MOUTH DAILY  Eczema, unspecified type -    Refilled triamcinolone cream  Screening cholesterol level -     Lipid panel  Medication management -     Magnesium  Routine general medical examination at a health care facility 1 year  Needs flu shot -     FLU VACCINE MDCK QUAD W/Preservative  Hot flashes -     escitalopram (LEXAPRO) 20 MG tablet; TAKE 1 TABLET(20 MG) BY MOUTH DAILY -     cloNIDine (CATAPRES) 0.2 MG tablet; TAKE 1 TABLET(0.2 MG) BY MOUTH AT BEDTIME CONTINUE EFFEXOR, CALL ABOUT DOSE THAT IS AVAILABLE -     venlafaxine XR (EFFEXOR-XR) 75 MG 24 hr capsule; TAKE 2 CAPSULES BY MOUTH EVERY MORNING AND 1 CAPSULE EVERY EVENING    Discussed med's effects and SE's. Screening labs and tests as requested with regular follow-up as recommended. Future Appointments  Date Time Provider [place]  01/10/2018 12:30 PM [name], MD [place] None  02/12/2018 12:45 PM [facility] None  02/12/2018  1:15 PM  Truitt Merle, MD [facility] None  12/04/2018  9:00 AM Chelsea Mutters, PA-C [facility] None    HPI 62 y.o. female  presents for a complete physical.  Her blood pressure has been controlled at home, today their BP is BP: 116/72 She does workout, yoga 4-5 x a week, walking, riding, and has been doing live strong at the [facility]. She is officially retired.   She denies chest pain, shortness of breath, dizziness.  She was diagnosed with right breast cancer ER +, PR+ HER2 - 02/2016, following with Chelsea Salinas, she is on letrozole, had double mastectomy with Dr. Donne Salinas 04/12/2016, BRCA 1+, had BSP dec 2018.  She is not on cholesterol medication and denies myalgias. Her cholesterol is at goal. The cholesterol last visit was:   Lab Results  Component Value Date   CHOL 203 (H) 11/22/2016   HDL 99 11/22/2016   LDLCALC 87 11/22/2016   TRIG 81 11/22/2016   CHOLHDL 2.1 11/22/2016   Last A1C in the office was:  Lab Results  Component Value Date   HGBA1C 5.5 05/26/2014   Patient is on Vitamin D supplement, one a day, unsure of dosage.  Lab Results  Component Value Date   VD25OH 42 11/22/2016   She follows with Chelsea Salinas.  She has atopy with eczema, allergies, and asthma. She is on flonase for allergies, needs  refill albuterol, has not been using it.  She is on lexapro and catapress for hot flashes that help.  Wt Readings from Last 3 Encounters:  11/23/17 144 lb 3.2 oz (65.4 kg)  09/28/17 149 lb 6.4 oz (67.8 kg)  04/27/17 154 lb (69.9 kg)    Current Medications:  Current Outpatient Medications on File Prior to Visit  Medication Sig Dispense Refill  . acyclovir ointment (ZOVIRAX) 5 % Apply 1 application topically every 3 (three) hours. 15 g 1  . Calcium-Magnesium-Vitamin D (CALCIUM 1200+D3 PO) Take 1 tablet by mouth every evening.    . cetirizine (ZYRTEC) 10 MG tablet Take 10 mg by mouth at bedtime.     . cloNIDine (CATAPRES) 0.2 MG tablet TAKE 1 TABLET(0.2 MG) BY MOUTH AT BEDTIME 90 tablet 3  .  escitalopram (LEXAPRO) 20 MG tablet Take 20 mg by mouth daily.  1  . fluticasone (FLONASE ALLERGY RELIEF) 50 MCG/ACT nasal spray Place 1 spray into both nostrils every morning.     . gabapentin (NEURONTIN) 300 MG capsule Take 1 capsule (300 mg total) by mouth at bedtime. 90 capsule 1  . letrozole (FEMARA) 2.5 MG tablet Take 1 tablet (2.5 mg total) by mouth every morning. 90 tablet 1  . losartan (COZAAR) 100 MG tablet TAKE 1 TABLET(100 MG) BY MOUTH DAILY 90 tablet 0  . montelukast (SINGULAIR) 10 MG tablet TAKE 1 TABLET(10 MG) BY MOUTH DAILY 90 tablet 3  . valACYclovir (VALTREX) 500 MG tablet Take 1 tablet (500 mg total) by mouth 2 (two) times daily. Take for 3 days as needed. 30 tablet 1  . venlafaxine XR (EFFEXOR-XR) 75 MG 24 hr capsule TAKE 2 CAPSULES BY MOUTH EVERY MORNING AND TAKE 1 CAPSULE BY MOUTH EVERY EVENING 270 capsule 1   No current facility-administered medications on file prior to visit.    Health Maintenance:   Immunization History  Administered Date(s) Administered  . Influenza Inj Mdck Quad With Preservative 11/22/2016, 11/23/2017  . Influenza, Seasonal, Injecte, Preservative Fre 11/16/2015  . Pneumococcal Polysaccharide-23 11/13/2014  . Td 02/08/1996, 02/07/2006  . Tdap 04/27/2017   Tetanus: 2019 at [facility] Pneumovax: 2016 Prevnar 13: due age 26 Flu vaccine: TODAY Zostavax: 2015 at [facility]  Pap: 04/2016, q 6 months,  Chelsea Salinas.  MGM: 02/2016 s/p masectomy DEXA: 12/2016 normal at OB/GYN  Colonoscopy: Nov 2017 Dr. Watt Climes 10 years EGD: N/A DEE: 1 year ago Stress test 2008 normal Echo 2018  Patient Care Team: [name], MD as PCP - General (Internal Medicine) [name], MD as Consulting Physician (Dermatology) [name], [name], NP as Nurse Practitioner (Hematology and Oncology) Truitt Merle, MD as Consulting Physician (Hematology) [name], MD as Consulting Physician (General Surgery)   Medical History:  Past Medical History:   Diagnosis Date  . 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 Breast cancer, right breast ([place]) dx 03/08/2016---  oncolgoist-  dr Burr Salinas   Stage 1A (pT1, pN0, pM0)  Grade 2,  ER+, PR-, HER2 - ;  invasive ductal carcinoma s/p  bilateral mastectomies w/ sln bx's (negative) and completed chemotherapy (05-20-2016 to 09-29-2016)  . Eczema   . Family history of breast cancer   . Family history of genetic disease carrier    paternal relatives with a BRCA mutation  . Family history of ovarian cancer   . History of abnormal cervical Pap smear age 82s  . History of exercise intolerance 06/27/2006   normal  . HSV-2 infection   .  Hypertension   . Seasonal and perennial allergic rhinitis   . Wears contact lenses    Allergies No Known Allergies  SURGICAL HISTORY She  has a past surgical history that includes Dilation and curettage of uterus (2008); Mastectomy w/ sentinel node biopsy (Bilateral, 04/12/2016); Breast biopsy (Right, 04/13/2016); Colonoscopy; Portacath placement (Right, 05/19/2016); transthoracic echocardiogram (05/10/2016); and Laparoscopic salpingo oophorectomy (Bilateral, 01/09/2017). FAMILY HISTORY Her family history includes Breast cancer in her cousin; Heart attack in her father; Heart disease in her father; Hypertension in her father; Ovarian cancer in her cousin, cousin, other, paternal aunt, and paternal aunt; Polymyalgia rheumatica in her mother; Prostate cancer in her father. SOCIAL HISTORY She  reports that she quit smoking about 21 years ago. She has a 25.00 pack-year smoking history. She has never used smokeless tobacco. She reports that she drinks about 14.0 standard drinks of alcohol per week. She reports that she does not use drugs.   Review of Systems  Constitutional: Negative.   HENT: Negative for congestion, ear discharge, ear pain, hearing loss, nosebleeds, sore throat and tinnitus.   Eyes: Negative.   Respiratory: Negative.  Negative for stridor.    Cardiovascular: Negative.   Gastrointestinal: Negative.   Genitourinary: Negative.   Musculoskeletal: Negative.   Skin: Negative for itching and rash.  Neurological: Negative.  Negative for headaches.  Endo/Heme/Allergies: Negative.   Psychiatric/Behavioral: Negative.      Physical Exam: Estimated body mass index is 24 kg/m as calculated from the following:   Height as of this encounter: 5' 5"  (1.651 m).   Weight as of this encounter: 144 lb 3.2 oz (65.4 kg). BP 116/72   Pulse 64   Temp 97.8 F (36.6 C)   Resp 14   Ht 5' 5"  (1.651 m)   Wt 144 lb 3.2 oz (65.4 kg)   LMP 02/07/1998 (Approximate)   SpO2 95%   BMI 24.00 kg/m  General Appearance: Well nourished, in no apparent distress. Eyes: PERRLA, EOMs, conjunctiva no swelling or erythema, normal fundi and vessels. Sinuses: No Frontal/maxillary tenderness ENT/Mouth: Ext aud canals clear, normal light reflex with TMs without erythema, bulging.  Good dentition. No erythema, swelling, or exudate on post pharynx. Tonsils not swollen or erythematous. Hearing normal.  Neck: Supple, thyroid normal. No bruits Respiratory: Respiratory effort normal, BS equal bilaterally without rales, rhonchi, wheezing or stridor. Cardio: RRR without murmurs, rubs or gallops. Brisk peripheral pulses without edema.  Chest: symmetric, with normal excursions and percussion. Breasts: defer Abdomen: Soft, +BS. Non tender, no guarding, rebound, hernias, masses, or organomegaly. .  Lymphatics: Non tender without lymphadenopathy.  Genitourinary: defer Musculoskeletal: Full ROM all peripheral extremities,5/5 strength, and normal gait. Skin: + dry scaly patches bilateral hands. Warm, dry without lesions, ecchymosis.  Neuro: Cranial nerves intact, reflexes equal bilaterally. Normal muscle tone, no cerebellar symptoms. Sensation intact.  Psych: Awake and oriented X 3, normal affect, Insight and Judgment appropriate.    Chelsea Salinas 9:27 AM

## 2017-11-23 ENCOUNTER — Ambulatory Visit: Payer: BLUE CROSS/BLUE SHIELD | Admitting: Physician Assistant

## 2017-11-23 ENCOUNTER — Encounter: Payer: Self-pay | Admitting: Physician Assistant

## 2017-11-23 VITALS — BP 116/72 | HR 64 | Temp 97.8°F | Resp 14 | Ht 65.0 in | Wt 144.2 lb

## 2017-11-23 DIAGNOSIS — Z13220 Encounter for screening for lipoid disorders: Secondary | ICD-10-CM | POA: Diagnosis not present

## 2017-11-23 DIAGNOSIS — Z79899 Other long term (current) drug therapy: Secondary | ICD-10-CM | POA: Diagnosis not present

## 2017-11-23 DIAGNOSIS — Z1329 Encounter for screening for other suspected endocrine disorder: Secondary | ICD-10-CM

## 2017-11-23 DIAGNOSIS — I10 Essential (primary) hypertension: Secondary | ICD-10-CM | POA: Diagnosis not present

## 2017-11-23 DIAGNOSIS — C50111 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 female breast: Secondary | ICD-10-CM

## 2017-11-23 DIAGNOSIS — Z1389 Encounter for screening for other disorder: Secondary | ICD-10-CM | POA: Diagnosis not present

## 2017-11-23 DIAGNOSIS — C50911 Malignant neoplasm of unspecified site of right female breast: Secondary | ICD-10-CM

## 2017-11-23 DIAGNOSIS — E559 Vitamin D deficiency, unspecified: Secondary | ICD-10-CM

## 2017-11-23 DIAGNOSIS — Z0000 Encounter for general adult medical examination without abnormal findings: Secondary | ICD-10-CM | POA: Diagnosis not present

## 2017-11-23 DIAGNOSIS — R232 Flushing: Secondary | ICD-10-CM

## 2017-11-23 DIAGNOSIS — Z136 Encounter for screening for cardiovascular disorders: Secondary | ICD-10-CM

## 2017-11-23 DIAGNOSIS — D6481 Anemia due to antineoplastic chemotherapy: Secondary | ICD-10-CM

## 2017-11-23 DIAGNOSIS — Z170 Estrogen receptor positive status [ER+]: Secondary | ICD-10-CM

## 2017-11-23 DIAGNOSIS — Z130 Encounter for screening for diseases of the blood and blood-forming organs and certain disorders involving the immune mechanism: Secondary | ICD-10-CM | POA: Diagnosis not present

## 2017-11-23 DIAGNOSIS — L309 Dermatitis, unspecified: Secondary | ICD-10-CM

## 2017-11-23 DIAGNOSIS — Z23 Encounter for immunization: Secondary | ICD-10-CM | POA: Diagnosis not present

## 2017-11-23 DIAGNOSIS — Z1501 Genetic susceptibility to malignant neoplasm of breast: Secondary | ICD-10-CM

## 2017-11-23 DIAGNOSIS — T7840XD Allergy, unspecified, subsequent encounter: Secondary | ICD-10-CM

## 2017-11-23 DIAGNOSIS — J45909 Unspecified asthma, uncomplicated: Secondary | ICD-10-CM

## 2017-11-23 DIAGNOSIS — Z1379 Encounter for other screening for genetic and chromosomal anomalies: Secondary | ICD-10-CM

## 2017-11-23 DIAGNOSIS — Z1509 Genetic susceptibility to other malignant neoplasm: Secondary | ICD-10-CM

## 2017-11-23 DIAGNOSIS — T451X5A Adverse effect of antineoplastic and immunosuppressive drugs, initial encounter: Secondary | ICD-10-CM

## 2017-11-23 MED ORDER — CLONIDINE HCL 0.2 MG PO TABS: ORAL_TABLET | ORAL | 3 refills | Status: DC

## 2017-11-23 MED ORDER — TRIAMCINOLONE ACETONIDE 0.1 % EX CREA: 1.0000 "application " | TOPICAL_CREAM | Freq: Two times a day (BID) | CUTANEOUS | 1 refills | Status: DC | PRN

## 2017-11-23 MED ORDER — LOSARTAN POTASSIUM 100 MG PO TABS: ORAL_TABLET | ORAL | 0 refills | Status: DC

## 2017-11-23 MED ORDER — ESCITALOPRAM OXALATE 20 MG PO TABS: 20.0000 mg | ORAL_TABLET | Freq: Every day | ORAL | 3 refills | Status: DC

## 2017-11-23 NOTE — Patient Instructions (Addendum)
Call about the effexor, try smaller pharmacies like [facility], [facility], [facility] and ask about the 37.5  And 150mg  dose.   Add ENTERIC COATED low dose 81 mg Aspirin daily OR can do every other day if you have easy bruising. . As well as to reduce risk of Colon Cancer by 20 %, Skin Cancer by 26 % , Melanoma by 46% and Pancreatic cancer by 60%.   GENERAL HEALTH GOALS  Know what a healthy weight is for you (roughly BMI <25) and aim to maintain this  Aim for 7+ servings of fruits and vegetables daily  70-80+ fluid ounces of water or unsweet tea for healthy kidneys  Limit to max 1 drink of alcohol per day; avoid smoking/tobacco  Limit animal fats in diet for cholesterol and heart health - choose grass fed whenever available  Avoid highly processed foods, and foods high in saturated/trans fats  Aim for low stress - take time to unwind and care for your mental health  Aim for 150 min of moderate intensity exercise weekly for heart health, and weights twice weekly for bone health  Aim for 7-9 hours of sleep daily

## 2017-11-24 LAB — TSH: TSH: 1.4 m[IU]/L (ref 0.40–4.50)

## 2017-11-24 LAB — COMPLETE METABOLIC PANEL WITH GFR
AG RATIO: 2.1 (calc) (ref 1.0–2.5)
ALT: 16 U/L (ref 6–29)
AST: 18 U/L (ref 10–35)
Albumin: 4.6 g/dL (ref 3.6–5.1)
Alkaline phosphatase (APISO): 79 U/L (ref 33–130)
BUN: 15 mg/dL (ref 7–25)
CALCIUM: 10.1 mg/dL (ref 8.6–10.4)
CO2: 30 mmol/L (ref 20–32)
CREATININE: 0.69 mg/dL (ref 0.50–0.99)
Chloride: 107 mmol/L (ref 98–110)
GFR, EST NON AFRICAN AMERICAN: 93 mL/min/{1.73_m2} (ref >=60)
GFR, Est African American: 107 mL/min/{1.73_m2} (ref >=60)
Globulin: 2.2 g/dL (calc) (ref 1.9–3.7)
Glucose, Bld: 93 mg/dL (ref 65–99)
POTASSIUM: 4.8 mmol/L (ref 3.5–5.3)
Sodium: 145 mmol/L (ref 135–146)
Total Bilirubin: 0.4 mg/dL (ref 0.2–1.2)
Total Protein: 6.8 g/dL (ref 6.1–8.1)

## 2017-11-24 LAB — LIPID PANEL
Cholesterol: 196 mg/dL (ref <200)
HDL: 95 mg/dL (ref >50)
LDL Cholesterol (Calc): 82 mg/dL (calc)
NON-HDL CHOLESTEROL (CALC): 101 mg/dL (ref <130)
TRIGLYCERIDES: 91 mg/dL (ref <150)
Total CHOL/HDL Ratio: 2.1 (calc) (ref <5.0)

## 2017-11-24 LAB — URINALYSIS, ROUTINE W REFLEX MICROSCOPIC
BILIRUBIN URINE: NEGATIVE
Glucose, UA: NEGATIVE
HGB URINE DIPSTICK: NEGATIVE
KETONES UR: NEGATIVE
Leukocytes, UA: NEGATIVE
NITRITE: NEGATIVE
PROTEIN: NEGATIVE
SPECIFIC GRAVITY, URINE: 1.02 (ref 1.001–1.03)
pH: 6 (ref 5.0–8.0)

## 2017-11-24 LAB — CBC WITH DIFFERENTIAL/PLATELET
BASOS ABS: 40 {cells}/uL (ref 0–200)
BASOS PCT: 1 %
EOS ABS: 80 {cells}/uL (ref 15–500)
Eosinophils Relative: 2 %
HCT: 39.7 % (ref 35.0–45.0)
Hemoglobin: 13.5 g/dL (ref 11.7–15.5)
Lymphs Abs: 1328 cells/uL (ref 850–3900)
MCH: 33.1 pg — ABNORMAL HIGH (ref 27.0–33.0)
MCHC: 34 g/dL (ref 32.0–36.0)
MCV: 97.3 fL (ref 80.0–100.0)
MONOS PCT: 12 %
MPV: 10.7 fL (ref 7.5–12.5)
Neutro Abs: 2072 cells/uL (ref 1500–7800)
Neutrophils Relative %: 51.8 %
PLATELETS: 223 10*3/uL (ref 140–400)
RBC: 4.08 10*6/uL (ref 3.80–5.10)
RDW: 12.4 % (ref 11.0–15.0)
TOTAL LYMPHOCYTE: 33.2 %
WBC: 4 10*3/uL (ref 3.8–10.8)
WBCMIX: 480 {cells}/uL (ref 200–950)

## 2017-11-24 LAB — MICROALBUMIN / CREATININE URINE RATIO
CREATININE, URINE: 97 mg/dL (ref 20–275)
MICROALB UR: 0.7 mg/dL
Microalb Creat Ratio: 7 mcg/mg creat (ref <30)

## 2017-11-24 LAB — IRON, TOTAL/TOTAL IRON BINDING CAP
%SAT: 29 % (calc) (ref 16–45)
Iron: 99 ug/dL (ref 45–160)
TIBC: 343 mcg/dL (calc) (ref 250–450)

## 2017-11-24 LAB — MAGNESIUM: MAGNESIUM: 1.9 mg/dL (ref 1.5–2.5)

## 2017-11-24 LAB — VITAMIN D 25 HYDROXY (VIT D DEFICIENCY, FRACTURES): VIT D 25 HYDROXY: 45 ng/mL (ref 30–100)

## 2017-12-18 ENCOUNTER — Other Ambulatory Visit: Payer: Self-pay | Admitting: Physician Assistant

## 2017-12-18 DIAGNOSIS — I10 Essential (primary) hypertension: Secondary | ICD-10-CM

## 2018-01-03 ENCOUNTER — Other Ambulatory Visit: Payer: Self-pay | Admitting: Internal Medicine

## 2018-01-03 ENCOUNTER — Other Ambulatory Visit: Payer: Self-pay | Admitting: Adult Health

## 2018-01-03 MED ORDER — ESCITALOPRAM OXALATE 20 MG PO TABS: 20.0000 mg | ORAL_TABLET | Freq: Every day | ORAL | 3 refills | Status: DC

## 2018-01-09 NOTE — Progress Notes (Signed)
62 y.o. G11P0010 Married Caucasian female here for annual exam.    Patient complaining of hot flashes. On Letrozole. Now on Gabapentin.  Taking Effexor and Lexapro.  No vaginal bleeding.   Labs with PCP and oncology.   PCP:   [name], MD  Patient's last menstrual period was 02/07/1998 (approximate).           Sexually active: No. female The current method of family planning is post menopausal status.    Exercising: Yes.    walking, yoga, pilates, zumba, ride horses Smoker:  Former, quit [place] Maintenance: Pap: 04-29-16 Neg:Neg HR HPV, 04-17-15 Neg:Neg HR HPV History of abnormal Pap:  Yes, Hx of colposcopy and some type of treatment to cervix in her 68s which was done at the hospital. Later in life hx of abnormal paps with Dr. Carren Rang, but only had repeat paps--no treatment. MMG:Pt.w/Bil Mast.due to Rt.Br.CA 04/2016--02-29-16 Density C/distortion Rt.breast & poss.asymmetry;Lt.Br.poss.asymmetry--Diag.MMG & Rt.Br.US revealed mass in Rt.Br.suspicious for malignancy. 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 Carin Primrose 12/2025 BMD: 12-27-16  Result :Normal TDaP: 2019 Gardasil:   no HIV:Neg years ago Hep C: no Screening Labs:  --- Flu vaccine:  Done.    reports that she quit smoking about 21 years ago. She has a 25.00 pack-year smoking history. She has never used smokeless tobacco. She reports that she drinks about 14.0 standard drinks of alcohol per week. She reports that she does not use drugs.  Past Medical History:  Diagnosis Date  . 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 Breast cancer, right breast ([place]) dx 03/08/2016---  oncolgoist-  [name]   Stage 1A (pT1, pN0, pM0)  Grade 2,  ER+, PR-, HER2 - ;  invasive ductal carcinoma s/p  bilateral mastectomies w/ sln bx's (negative) and completed chemotherapy (05-20-2016 to 09-29-2016)  . Eczema   . Family history of breast cancer   .  Family history of genetic disease carrier    paternal relatives with a BRCA mutation  . Family history of ovarian cancer   . History of abnormal cervical Pap smear age 13s  . History of exercise intolerance 06/27/2006   normal  . HSV-2 infection   . Hypertension   . Seasonal and perennial allergic rhinitis   . Wears contact lenses     Past Surgical History:  Procedure Laterality Date  . BREAST BIOPSY Right 04/13/2016   Procedure: Evacuation RIGHT Breast  Hematoma;  Surgeon: [name], MD;  Location: [place];  Service: General;  Laterality: Right;  . COLONOSCOPY    . DILATION AND CURETTAGE OF UTERUS  2008  . LAPAROSCOPIC SALPINGO OOPHERECTOMY Bilateral 01/09/2017   Procedure: LAPAROSCOPIC SALPINGO OOPHORECTOMY with pelvic washings;  Surgeon: [name], MD;  Location: [facility];  Service: Gynecology;  Laterality: Bilateral;  . MASTECTOMY W/ SENTINEL NODE BIOPSY Bilateral 04/12/2016   Procedure: BILATERAL TOTAL MASTECTOMIES  WITH RIGHT SENTINEL LYMPH NODE BIOPSY;  Surgeon: [name], MD;  Location: [place];  Service: General;  Laterality: Bilateral;  . PORTACATH PLACEMENT Right 05/19/2016   Procedure: INSERTION PORT-A-CATH WITH [place];  Surgeon: [name], MD;  Location: [place];  Service: General;  Laterality: Right;  . TRANSTHORACIC ECHOCARDIOGRAM  05/10/2016   ef 96-78%, grade 1 diastolic dyfuntion/  trivial TR    Current Outpatient Medications  Medication Sig Dispense Refill  . acyclovir ointment (ZOVIRAX) 5 % Apply 1 application topically every 3 (  three) hours. 15 g 1  . Calcium-Magnesium-Vitamin D (CALCIUM 1200+D3 PO) Take 1 tablet by mouth every evening.    . cetirizine (ZYRTEC) 10 MG tablet Take 10 mg by mouth at bedtime.     . cloNIDine (CATAPRES) 0.2 MG tablet TAKE 1 TABLET(0.2 MG) BY MOUTH AT BEDTIME 90 tablet 3  . escitalopram (LEXAPRO) 20 MG tablet Take 1 tablet (20 mg total) by mouth daily. 90 tablet 3  . fluticasone (FLONASE  ALLERGY RELIEF) 50 MCG/ACT nasal spray Place 1 spray into both nostrils every morning.     . gabapentin (NEURONTIN) 300 MG capsule Take 1 capsule (300 mg total) by mouth at bedtime. 90 capsule 1  . letrozole (FEMARA) 2.5 MG tablet Take 1 tablet (2.5 mg total) by mouth every morning. 90 tablet 1  . losartan (COZAAR) 100 MG tablet TAKE 1 TABLET(100 MG) BY MOUTH DAILY 90 tablet 1  . montelukast (SINGULAIR) 10 MG tablet TAKE 1 TABLET(10 MG) BY MOUTH DAILY 90 tablet 3  . triamcinolone cream (KENALOG) 0.1 % Apply 1 application topically 2 (two) times daily as needed (eczema). 453.6 g 1  . valACYclovir (VALTREX) 500 MG tablet Take 1 tablet (500 mg total) by mouth 2 (two) times daily. Take for 3 days as needed. 30 tablet 1  . venlafaxine XR (EFFEXOR-XR) 75 MG 24 hr capsule TAKE 2 CAPSULES BY MOUTH EVERY MORNING AND TAKE 1 CAPSULE BY MOUTH EVERY EVENING 270 capsule 1   No current facility-administered medications for this visit.     Family History  Problem Relation Age of Onset  . Polymyalgia rheumatica Mother   . Hypertension Father   . Heart disease Father        Dec 69  . Heart attack Father   . Prostate cancer Father        Dx 38s; deceased 51  . Breast cancer Cousin        Dx 2s; daughter of a paternal uncle  . Ovarian cancer Paternal Aunt        Dx 56; deceased  . Ovarian cancer Cousin        Dx 58s; daughter of a paternal uncle  . Ovarian cancer Cousin        Dx 28; daughter of a paternal uncle  . Ovarian cancer Paternal Aunt        Dx 27s.  BRCA positive  . Ovarian cancer Other        pat grandfather's mother    Review of Systems  HENT: Positive for congestion.        Head cold/congestion  All other systems reviewed and are negative.   Exam:   BP 132/80 (BP Location: Right Arm, Patient Position: Sitting, Cuff Size: Large)   Pulse 76   Resp 18   Ht 5' 5"  (1.651 m)   Wt 147 lb 9.6 oz (67 kg)   LMP 02/07/1998 (Approximate)   BMI 24.56 kg/m     General appearance: alert,  cooperative and appears stated age Head: Normocephalic, without obvious abnormality, atraumatic Neck: no adenopathy, supple, symmetrical, trachea midline and thyroid normal to inspection and palpation Lungs: clear to auscultation bilaterally Breasts:  Absent.  Heart: regular rate and rhythm Abdomen: soft, non-tender; no masses, no organomegaly Extremities: extremities normal, atraumatic, no cyanosis or edema Skin: Skin color, texture, turgor normal. No rashes or lesions Lymph nodes: Cervical, supraclavicular, and axillary nodes normal. No abnormal inguinal nodes palpated Neurologic: Grossly normal  Pelvic: External genitalia:  no lesions  Urethra:  normal appearing urethra with no masses, tenderness or lesions              Bartholins and Skenes: normal                 Vagina:  Atrophy noted.               Cervix: no lesions              Pap taken: No. Bimanual Exam:  Uterus:  normal size, contour, position, consistency, mobility, non-tender              Adnexa: no mass, fullness, tenderness. Right vaginal apical scar tissue.               Rectal exam: Yes.  .  Confirms.              Anus:  normal sphincter tone, no lesions  Chaperone was present for exam.  Assessment:   Well woman visit with normal exam. BRCA1 positive.  Status post bilateral mastectomy for breast cancer.  Status post laparoscopic BSO. Remote history of abnormal pap smear. Hx HSV II. Right vaginal scar tissue.  Plan: Mammogram screening. Recommended self breast awareness. Pap and HR HPV as above. Guidelines for Calcium, Vitamin D, regular exercise program including cardiovascular and weight bearing exercise. Vit E suppositories.  FU for recheck in 8 weeks.  Follow up annually and prn.   After visit summary provided.

## 2018-01-10 ENCOUNTER — Ambulatory Visit: Payer: BLUE CROSS/BLUE SHIELD | Admitting: Obstetrics and Gynecology

## 2018-01-10 ENCOUNTER — Other Ambulatory Visit: Payer: Self-pay

## 2018-01-10 ENCOUNTER — Encounter: Payer: Self-pay | Admitting: Obstetrics and Gynecology

## 2018-01-10 VITALS — BP 132/80 | HR 76 | Resp 18 | Ht 65.0 in | Wt 147.6 lb

## 2018-01-10 DIAGNOSIS — Z01419 Encounter for gynecological examination (general) (routine) without abnormal findings: Secondary | ICD-10-CM | POA: Diagnosis not present

## 2018-01-10 DIAGNOSIS — L905 Scar conditions and fibrosis of skin: Secondary | ICD-10-CM | POA: Diagnosis not present

## 2018-01-10 NOTE — Patient Instructions (Signed)

## 2018-02-12 ENCOUNTER — Inpatient Hospital Stay: Payer: BLUE CROSS/BLUE SHIELD | Attending: Hematology

## 2018-02-12 ENCOUNTER — Telehealth: Payer: Self-pay | Admitting: Hematology

## 2018-02-12 ENCOUNTER — Inpatient Hospital Stay (HOSPITAL_BASED_OUTPATIENT_CLINIC_OR_DEPARTMENT_OTHER): Payer: BLUE CROSS/BLUE SHIELD | Admitting: Hematology

## 2018-02-12 ENCOUNTER — Encounter: Payer: Self-pay | Admitting: Hematology

## 2018-02-12 VITALS — BP 100/87 | HR 86 | Temp 98.4°F | Resp 18 | Ht 65.0 in | Wt 149.4 lb

## 2018-02-12 DIAGNOSIS — Z1509 Genetic susceptibility to other malignant neoplasm: Secondary | ICD-10-CM

## 2018-02-12 DIAGNOSIS — Z9013 Acquired absence of bilateral breasts and nipples: Secondary | ICD-10-CM | POA: Insufficient documentation

## 2018-02-12 DIAGNOSIS — I10 Essential (primary) hypertension: Secondary | ICD-10-CM

## 2018-02-12 DIAGNOSIS — Z9181 History of falling: Secondary | ICD-10-CM | POA: Insufficient documentation

## 2018-02-12 DIAGNOSIS — C50111 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 female breast: Secondary | ICD-10-CM | POA: Insufficient documentation

## 2018-02-12 DIAGNOSIS — Z1501 Genetic susceptibility to malignant neoplasm of breast: Secondary | ICD-10-CM

## 2018-02-12 DIAGNOSIS — Z170 Estrogen receptor positive status [ER+]: Secondary | ICD-10-CM | POA: Diagnosis not present

## 2018-02-12 LAB — CBC WITH DIFFERENTIAL/PLATELET
Abs Immature Granulocytes: 0.01 10*3/uL (ref 0.00–0.07)
BASOS ABS: 0 10*3/uL (ref 0.0–0.1)
Basophils Relative: 1 %
EOS ABS: 0.1 10*3/uL (ref 0.0–0.5)
EOS PCT: 2 %
HEMATOCRIT: 40 % (ref 36.0–46.0)
Hemoglobin: 13.2 g/dL (ref 12.0–15.0)
Immature Granulocytes: 0 %
LYMPHS ABS: 1.7 10*3/uL (ref 0.7–4.0)
Lymphocytes Relative: 34 %
MCH: 33.2 pg (ref 26.0–34.0)
MCHC: 33 g/dL (ref 30.0–36.0)
MCV: 100.8 fL — AB (ref 80.0–100.0)
MONOS PCT: 11 %
Monocytes Absolute: 0.6 10*3/uL (ref 0.1–1.0)
NRBC: 0 % (ref 0.0–0.2)
Neutro Abs: 2.7 10*3/uL (ref 1.7–7.7)
Neutrophils Relative %: 52 %
Platelets: 226 10*3/uL (ref 150–400)
RBC: 3.97 MIL/uL (ref 3.87–5.11)
RDW: 12.4 % (ref 11.5–15.5)
WBC: 5.1 10*3/uL (ref 4.0–10.5)

## 2018-02-12 LAB — COMPREHENSIVE METABOLIC PANEL
ALK PHOS: 94 U/L (ref 38–126)
ALT: 16 U/L (ref 0–44)
AST: 17 U/L (ref 15–41)
Albumin: 4.3 g/dL (ref 3.5–5.0)
Anion gap: 8 (ref 5–15)
BUN: 17 mg/dL (ref 8–23)
CALCIUM: 9.9 mg/dL (ref 8.9–10.3)
CO2: 30 mmol/L (ref 22–32)
CREATININE: 0.85 mg/dL (ref 0.44–1.00)
Chloride: 103 mmol/L (ref 98–111)
GFR calc Af Amer: >60 mL/min (ref >60)
GFR calc non Af Amer: >60 mL/min (ref >60)
Glucose, Bld: 99 mg/dL (ref 70–99)
Potassium: 4.9 mmol/L (ref 3.5–5.1)
SODIUM: 141 mmol/L (ref 135–145)
Total Bilirubin: 0.5 mg/dL (ref 0.3–1.2)
Total Protein: 7.2 g/dL (ref 6.5–8.1)

## 2018-02-12 NOTE — Progress Notes (Signed)
[place]   [phone] [phone]   Clinic Follow up Note   Patient Care Team: [name], MD as PCP - General (Internal Medicine) [name], MD as Consulting Physician (Dermatology) [name], [name], NP as Nurse Practitioner (Hematology and Oncology) Truitt Merle, MD as Consulting Physician (Hematology) [name], MD as Consulting Physician (General Surgery)  Date of Service:  02/12/2018  CHIEF COMPLAINT: F/u of right breast cancer   SUMMARY OF ONCOLOGIC HISTORY: Oncology History   Cancer Staging 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 breast in female, estrogen receptor positive ([place]) Staging form: Breast, AJCC 8th Edition - Clinical stage from 03/08/2016: Stage IA (cT1c, cN0, cM0, G2, ER: Positive, PR: Negative, HER2: Negative) - Signed by Truitt Merle, MD on 03/15/2016 - Pathologic stage from 04/12/2016: Stage IA (pT1c, pN0, cM0, G2, ER: Positive, PR: Negative, HER2: Negative, Oncotype DX score: 57) - Signed by Truitt Merle, MD on 05/05/2016       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 breast in female, estrogen receptor positive ([place])   03/04/2016 Mammogram    Diagnostic bilateral mammogram and right breast ultrasound showed a 1.7 cm mass in the 12:00 position of the right breast, highly suspicious for malignancy. There is a borderline abnormal right axillary lymph node, also suspicious for metastasis.     03/08/2016 Initial Diagnosis    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 breast in female, estrogen receptor positive ([place])    03/08/2016 Initial Biopsy    Right breast 12:00 core needle biopsy showed invasive ductal carcinoma, grade 2, right axillary lymph node biopsy was negative.    03/08/2016 Receptors her2    ER 20% positive, PR negative, HER-2 negative, Ki-67 20%    03/30/2016 Imaging    MR Bilateral Breast 03/30/2016 IMPRESSION: Known unifocal right breast malignancy. No MRI evidence of  malignancy in the left breast.    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  Genes Analyzed: 43 genes on [facility]'s Common Cancers panel (APC, ATM, AXIN2, BARD1, BMPR1A, BRCA1, BRCA2, BRIP1, CDH1, CDKN2A, CHEK2, DICER1, EPCAM, GREM1, HOXB13, KIT, MEN1, MLH1, MSH2, MSH6, MUTYH, NBN, NF1, PALB2, PDGFRA, PMS2, POLD1, POLE, PTEN, RAD50, RAD51C, RAD51D, SDHA, SDHB, SDHC, SDHD, SMAD4, SMARCA4, STK11, TP53, TSC1, TSC2, VHL).     04/12/2016 Surgery    Bilateral mastectomy and right sentinel lymph node biopsy, by [name]    04/12/2016 Pathology Results    Right breast mastectomy showed invasive grade 2 invasive ductal carcinoma, 1.7 cm, margins were negative, 3 sentinel lymph nodes were negative, left simple mastectomy fibroadenoma, one benign node, no atypia or tumor.     05/04/2016 Oncotype testing    Recurrence score 49, high-risk, predicts 10 year risk of distant recurrence 32% with tamoxifen alone.    05/10/2016 Echocardiogram    Echo on 05/10/16 showed EF 55-60%.    05/20/2016 - 09/29/2016 Chemotherapy    dose dense Adriamycin and Cytoxan, every 2 weeks starting 05/20/16 for 4 cycles, followed by Taxol weekly (Taxol Changed to Abaraxane 126m/m2 on 09/01/17 due to skin rash).     10/2016 -  Anti-estrogen oral therapy     Anastrozole started in 10/2016 and stopped after 2 weeks due to side effects. Switched to Letrozole 11/30/2016     12/27/2016 Imaging    Bone Density 12/27/16  ASSESSMENT: The BMD measured at Femur Neck Right is 0.938 g/cm2 with a T-score of -0.7. This patient is considered normal according to World  Health Organization [facility]) criteria. Site Region Measured Date Measured Age YA BMD Significant CHANGE T-score DualFemur Neck Right 12/27/2016    63.0         -0.5    0.938 g/cm2 AP Spine  L1-L4      12/27/2016    61.0         0.0     1.200 g/cm2    12/27/2016 Imaging    12/27/2016 Bone Density ASSESSMENT: The BMD measured at  Femur Neck Right is 0.938 g/cm2 with a T-score of -0.7.    01/05/2017 Survivorship    Survivorship Clinic with with Gardenia Phlegm, NP      01/09/2017 Surgery    LAPAROSCOPIC SALPINGO OOPHORECTOMY with pelvic washings by Dr. [name] and Dr. [name] 01/09/17  Diagnosis Ovaries and fallopian tubes, bilateral - BENIGN OVARIES AND FALLOPIAN TUBES WITH PARATUBAL CYST - NO MALIGNANCY IDENTIFIED      Breast cancer, right ([place])   04/12/2016 Initial Diagnosis    Breast cancer, right ([place])      CURRENT THERAPY:  Anastrozole started in 10/2016 and stopped after 2 weeks due to side effects. Switched to Letrozole 11/30/2016  INTERVAL HISTORY:  Chelsea Salinas is here for a follow up of her right breast cancer. She presents to the clinic today by herself. She notes she is doing well.  She notes falling off her horse 1 week ago and has a large bruise on her right forearm. She does not occasional joint pain.  She notes her BP was lower today 100/87. She denies any chest pain or discomfort. She dot not keep up with her salt intake but makes sure she takes her medication. She notes following up with her other physicians every 6-12 months.     REVIEW OF SYSTEMS:  Constitutional: Denies fevers, chills or abnormal weight loss Eyes: Denies blurriness of vision Ears, nose, mouth, throat, and face: Denies mucositis or sore throat Respiratory: Denies cough, dyspnea or wheezes Cardiovascular: Denies palpitation, chest discomfort or lower extremity swelling Gastrointestinal:  Denies nausea, heartburn or change in bowel habits Skin: Denies abnormal skin rashes (+) large bruise on right forearm  MSK: (+) Mild occasional joint pain  Lymphatics: Denies new lymphadenopathy or easy bruising Neurological:Denies numbness, tingling or new weaknesses Behavioral/Psych: Mood is stable, no new changes  All other systems were reviewed with the patient and are negative.  MEDICAL HISTORY:  Past  Medical History:  Diagnosis Date  . 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 Breast cancer, right breast ([place]) dx 03/08/2016---  oncolgoist-  [name]   Stage 1A (pT1, pN0, pM0)  Grade 2,  ER+, PR-, HER2 - ;  invasive ductal carcinoma s/p  bilateral mastectomies w/ sln bx's (negative) and completed chemotherapy (05-20-2016 to 09-29-2016)  . Eczema   . Family history of breast cancer   . Family history of genetic disease carrier    paternal relatives with a BRCA mutation  . Family history of ovarian cancer   . History of abnormal cervical Pap smear age 62s  . History of exercise intolerance 06/27/2006   normal  . HSV-2 infection   . Hypertension   . Seasonal and perennial allergic rhinitis   . Wears contact lenses     SURGICAL HISTORY: Past Surgical History:  Procedure Laterality Date  . BREAST BIOPSY Right 04/13/2016   Procedure: Evacuation RIGHT Breast  Hematoma;  Surgeon: [name], MD;  Location: [place];  Service: General;  Laterality: Right;  . COLONOSCOPY    . DILATION AND CURETTAGE OF UTERUS  2008  . LAPAROSCOPIC SALPINGO OOPHERECTOMY Bilateral 01/09/2017   Procedure: LAPAROSCOPIC SALPINGO OOPHORECTOMY with pelvic washings;  Surgeon: [name], MD;  [facility];  Service: Gynecology;  Laterality: Bilateral;  . MASTECTOMY W/ SENTINEL NODE BIOPSY Bilateral 04/12/2016   Procedure: BILATERAL TOTAL MASTECTOMIES  WITH RIGHT SENTINEL LYMPH NODE BIOPSY;  Surgeon: [name], MD;  Location: [place];  Service: General;  Laterality: Bilateral;  . PORTACATH PLACEMENT Right 05/19/2016   Procedure: INSERTION PORT-A-CATH WITH [place];  Surgeon: [name], MD;  Location: [place];  Service: General;  Laterality: Right;  . TRANSTHORACIC ECHOCARDIOGRAM  05/10/2016   ef 34-19%, grade 1 diastolic dyfuntion/  trivial TR    I have reviewed the social history and family history with the patient and they are unchanged from  previous note.  ALLERGIES:  has No Known Allergies.  MEDICATIONS:  Current Outpatient Medications  Medication Sig Dispense Refill  . acyclovir ointment (ZOVIRAX) 5 % Apply 1 application topically every 3 (three) hours. 15 g 1  . aspirin EC 81 MG tablet Take 81 mg by mouth daily.    . Calcium-Magnesium-Vitamin D (CALCIUM 1200+D3 PO) Take 1 tablet by mouth every evening.    . cetirizine (ZYRTEC) 10 MG tablet Take 10 mg by mouth at bedtime.     . cloNIDine (CATAPRES) 0.2 MG tablet TAKE 1 TABLET(0.2 MG) BY MOUTH AT BEDTIME 90 tablet 3  . escitalopram (LEXAPRO) 20 MG tablet Take 1 tablet (20 mg total) by mouth daily. 90 tablet 3  . fluticasone (FLONASE ALLERGY RELIEF) 50 MCG/ACT nasal spray Place 1 spray into both nostrils every morning.     . gabapentin (NEURONTIN) 300 MG capsule Take 1 capsule (300 mg total) by mouth at bedtime. 90 capsule 1  . letrozole (FEMARA) 2.5 MG tablet Take 1 tablet (2.5 mg total) by mouth every morning. 90 tablet 1  . losartan (COZAAR) 100 MG tablet TAKE 1 TABLET(100 MG) BY MOUTH DAILY 90 tablet 1  . montelukast (SINGULAIR) 10 MG tablet TAKE 1 TABLET(10 MG) BY MOUTH DAILY 90 tablet 3  . triamcinolone cream (KENALOG) 0.1 % Apply 1 application topically 2 (two) times daily as needed (eczema). 453.6 g 1  . valACYclovir (VALTREX) 500 MG tablet Take 1 tablet (500 mg total) by mouth 2 (two) times daily. Take for 3 days as needed. 30 tablet 1  . venlafaxine XR (EFFEXOR-XR) 75 MG 24 hr capsule TAKE 2 CAPSULES BY MOUTH EVERY MORNING AND TAKE 1 CAPSULE BY MOUTH EVERY EVENING 270 capsule 1   No current facility-administered medications for this visit.     PHYSICAL EXAMINATION: ECOG PERFORMANCE STATUS: 0 - Asymptomatic  Vitals:   02/12/18 1301  BP: 100/87  Pulse: 86  Resp: 18  Temp: 98.4 F (36.9 C)  SpO2: 100%   Filed Weights   02/12/18 1301  Weight: 149 lb 6.4 oz (67.8 kg)    GENERAL:alert, no distress and comfortable SKIN: skin color, texture, turgor are  normal, no rashes or significant lesions (+) large bruise on right forearm from fall  EYES: normal, Conjunctiva are pink and non-injected, sclera clear OROPHARYNX:no exudate, no erythema and lips, buccal mucosa, and tongue normal  NECK: supple, thyroid normal size, non-tender, without nodularity LYMPH:  no palpable lymphadenopathy in the cervical, axillary or inguinal LUNGS: clear to auscultation and percussion with normal breathing effort HEART: regular rate & rhythm and no murmurs and  no lower extremity edema ABDOMEN:abdomen soft, non-tender and normal bowel sounds Musculoskeletal:no cyanosis of digits and no clubbing  NEURO: alert & oriented x 3 with fluent speech, no focal motor/sensory deficits BREAST: s/p right breast lumpectomy: surgical incision healed well.   LABORATORY DATA:  I have reviewed the data as listed CBC Latest Ref Rng & Units 02/12/2018 11/23/2017 09/28/2017  WBC 4.0 - 10.5 K/uL 5.1 4.0 4.4  Hemoglobin 12.0 - 15.0 g/dL 13.2 13.5 12.9  Hematocrit 36.0 - 46.0 % 40.0 39.7 38.6  Platelets 150 - 400 K/uL 226 223 195     CMP Latest Ref Rng & Units 02/12/2018 11/23/2017 09/28/2017  Glucose 70 - 99 mg/dL 99 93 94  BUN 8 - 23 mg/dL _0 Creatinine 0.44 - 1.00 mg/dL 0.85 0.69 0.69  Sodium 135 - 145 mmol/L 141 145 141  Potassium 3.5 - 5.1 mmol/L 4.9 4.8 4.2  Chloride 98 - 111 mmol/L 103 107 103  CO2 22 - 32 mmol/L _1 Calcium 8.9 - 10.3 mg/dL 9.9 10.1 9.4  Total Protein 6.5 - 8.1 g/dL 7.2 6.8 6.8  Total Bilirubin 0.3 - 1.2 mg/dL 0.5 0.4 0.3  Alkaline Phos 38 - 126 U/L 94 - 95  AST 15 - 41 U/L _2 ALT 0 - 44 U/L _3 RADIOGRAPHIC STUDIES: I have personally reviewed the radiological images as listed and agreed with the findings in the report. No results found.   ASSESSMENT & PLAN:  Chelsea Salinas is a 63 y.o. female with   1. 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 Oncotype RS 49, high risk  -She was diagnosed in 02/2016. She is s/p b/l mastectomy and BSO due to her BRCA1+ mutation. She completed adjuvant chemo AC-T.  -She started antiestrogen with Anastrozole in 10/2016 and stopped after 2 weeks due to side effects. Switched to Letrozole 11/30/2016. She tolerated letrozole well. -She is clinically doing well. Lab reviewed, her CBC and CMP are within normal limits. Her physical exam and was unremarkable. There is no clinical concern for recurrence. -Given she has had a b/l mastectomy there is no need for mammograms.  -Continue letrozole, plan for 5 years  -F/u in 6 months  2. BRCA1 mutation (+) -We previously discussed the high risk of breast cancer and ovarian cancer due to the BRCA1 mutation. She is status post bilateral mastectomy and BSO. -We also discussed slightly increased risk of pancreatic cancer, however she does not have any  family history of pancreatic cancer, does not meet the criteria for pancreatic cancer screening. -Her sister has done genetic testing, results were negative. She has no children   3. Bone Health -Her 12/2016 DEXA was normal, will repeat in 12/2018 -Continue VitD and calcium daily    Plan:  -Continue letrozole  -Lab and f/u in 6 months    No problem-specific Assessment & Plan notes found for this encounter.   No orders of the defined types were placed in this encounter.  All questions were answered. The patient knows to call the clinic with any problems, questions or concerns. No barriers to learning was detected. I spent 15 minutes counseling the patient face to face. The total time spent in the appointment was 20 minutes and more than 50% was on counseling and review of test results     Truitt Merle,  MD 02/12/2018   Oneal Deputy, am acting as scribe for Truitt Merle, MD.   I have reviewed the above documentation for accuracy and completeness, and I agree with the above.

## 2018-02-12 NOTE — Telephone Encounter (Signed)
Printed calendar and avs. °

## 2018-02-16 ENCOUNTER — Other Ambulatory Visit: Payer: Self-pay | Admitting: Internal Medicine

## 2018-03-07 ENCOUNTER — Other Ambulatory Visit: Payer: Self-pay

## 2018-03-07 ENCOUNTER — Other Ambulatory Visit (HOSPITAL_COMMUNITY)
Admission: RE | Admit: 2018-03-07 | Discharge: 2018-03-07 | Disposition: A | Discharge status: Home / Self Care | Payer: BLUE CROSS/BLUE SHIELD | Source: Ambulatory Visit | Attending: Obstetrics and Gynecology | Admitting: Obstetrics and Gynecology

## 2018-03-07 ENCOUNTER — Encounter: Payer: Self-pay | Admitting: Obstetrics and Gynecology

## 2018-03-07 ENCOUNTER — Ambulatory Visit (INDEPENDENT_AMBULATORY_CARE_PROVIDER_SITE_OTHER): Payer: BLUE CROSS/BLUE SHIELD | Admitting: Obstetrics and Gynecology

## 2018-03-07 VITALS — BP 120/80 | HR 76 | Ht 65.0 in | Wt 146.6 lb

## 2018-03-07 DIAGNOSIS — Z124 Encounter for screening for malignant neoplasm of cervix: Secondary | ICD-10-CM | POA: Insufficient documentation

## 2018-03-07 DIAGNOSIS — Q5210 Doubling of vagina, unspecified: Secondary | ICD-10-CM

## 2018-03-07 NOTE — Progress Notes (Signed)
GYNECOLOGY  VISIT   HPI: 63 y.o.   Married  Caucasian  female   G0P0 with Patient's last menstrual period was 02/07/1998 (approximate).   here for follow up.    Has right vaginal scar tissue.   Treating with vit E suppositories nightly.  Used it last hs.  Feels more comfortable.  Not sexually active.  Has been told she has a small vaginal opening and that it is difficult to get to her cervix.   Prior ultrasound images from 2018 reviewed and no Mullerian anomaly noted.  Her uterus, tubes and ovaries looked normal at the time of her laparoscopic BSO for BRCA1.   GYNECOLOGIC HISTORY: Patient's last menstrual period was 02/07/1998 (approximate). Contraception:  Post menopausal. Menopausal hormone therapy: none Last mammogram::Pt.w/Bil Mast.due to Rt.Br.CA 04/2016--02-29-16 Density C/distortion Rt.breast & poss.asymmetry;Lt.Br.poss.asymmetry--Diag.MMG & Rt.Br.US revealed mass in Rt.Br.suspicious for malignancy. 03-08-16 Rt.Br.Bx revealed GRADE II INVASIVE MAMMARY CARCINOMA  Last pap smear:  04-29-16 Neg:Neg HR HPV, 04-17-15 Neg:Neg HR HPV         OB History    Gravida  1   Para      Term      Preterm      AB  1   Living  0     SAB      TAB      Ectopic      Multiple      Live Births                 Patient Active Problem List   Diagnosis Date Noted  . Scar tissue 01/10/2018  . Port-A-Cath in place 11/08/2016  . Anemia due to antineoplastic chemotherapy 06/17/2016  . Breast cancer, right ([place]) 04/12/2016  . Genetic testing 04/08/2016  . BRCA1 positive   . 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 breast in female, estrogen receptor positive ([place]) 03/15/2016  . Vitamin D deficiency 05/18/2015  . Hot flashes 05/18/2015  . Hypertension   . Asthma   . Allergy   . Eczema     Past Medical History:  Diagnosis Date  . 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 Breast cancer, right breast ([place]) dx 03/08/2016---  oncolgoist-  dr  Burr Medico   Stage 1A (pT1, pN0, pM0)  Grade 2,  ER+, PR-, HER2 - ;  invasive ductal carcinoma s/p  bilateral mastectomies w/ sln bx's (negative) and completed chemotherapy (05-20-2016 to 09-29-2016)  . Eczema   . Family history of breast cancer   . Family history of genetic disease carrier    paternal relatives with a BRCA mutation  . Family history of ovarian cancer   . History of abnormal cervical Pap smear age 8s  . History of exercise intolerance 06/27/2006   normal  . HSV-2 infection   . Hypertension   . Seasonal and perennial allergic rhinitis   . Wears contact lenses     Past Surgical History:  Procedure Laterality Date  . BREAST BIOPSY Right 04/13/2016   Procedure: Evacuation RIGHT Breast  Hematoma;  Surgeon: [name], MD;  Location: [place];  Service: General;  Laterality: Right;  . COLONOSCOPY    . DILATION AND CURETTAGE OF UTERUS  2008  . LAPAROSCOPIC SALPINGO OOPHERECTOMY Bilateral 01/09/2017   Procedure: LAPAROSCOPIC SALPINGO OOPHORECTOMY with pelvic washings;  Surgeon: [name], MD;  Location: [facility];  Service: Gynecology;  Laterality: Bilateral;  . MASTECTOMY W/ SENTINEL NODE BIOPSY Bilateral 04/12/2016  Procedure: BILATERAL TOTAL MASTECTOMIES  WITH RIGHT SENTINEL LYMPH NODE BIOPSY;  Surgeon: [name], MD;  Location: [place];  Service: General;  Laterality: Bilateral;  . PORTACATH PLACEMENT Right 05/19/2016   Procedure: INSERTION PORT-A-CATH WITH [place];  Surgeon: [name], MD;  Location: [place];  Service: General;  Laterality: Right;  . TRANSTHORACIC ECHOCARDIOGRAM  05/10/2016   ef 93-57%, grade 1 diastolic dyfuntion/  trivial TR    Current Outpatient Medications  Medication Sig Dispense Refill  . acyclovir ointment (ZOVIRAX) 5 % Apply 1 application topically every 3 (three) hours. 15 g 1  . aspirin EC 81 MG tablet Take 81 mg by mouth daily.    . Calcium-Magnesium-Vitamin D (CALCIUM 1200+D3 PO) Take 1 tablet by mouth  every evening.    . cetirizine (ZYRTEC) 10 MG tablet Take 10 mg by mouth at bedtime.     . cloNIDine (CATAPRES) 0.2 MG tablet TAKE 1 TABLET(0.2 MG) BY MOUTH AT BEDTIME 90 tablet 3  . escitalopram (LEXAPRO) 20 MG tablet Take 1 tablet (20 mg total) by mouth daily. 90 tablet 3  . fluticasone (FLONASE ALLERGY RELIEF) 50 MCG/ACT nasal spray Place 1 spray into both nostrils every morning.     . gabapentin (NEURONTIN) 300 MG capsule Take 1 capsule (300 mg total) by mouth at bedtime. 90 capsule 1  . letrozole (FEMARA) 2.5 MG tablet Take 1 tablet (2.5 mg total) by mouth every morning. 90 tablet 1  . losartan (COZAAR) 100 MG tablet TAKE 1 TABLET(100 MG) BY MOUTH DAILY 90 tablet 1  . montelukast (SINGULAIR) 10 MG tablet TAKE 1 TABLET(10 MG) BY MOUTH DAILY 90 tablet 3  . triamcinolone cream (KENALOG) 0.1 % Apply 1 application topically 2 (two) times daily as needed (eczema). 453.6 g 1  . valACYclovir (VALTREX) 500 MG tablet Take 1 tablet (500 mg total) by mouth 2 (two) times daily. Take for 3 days as needed. 30 tablet 1  . venlafaxine XR (EFFEXOR-XR) 75 MG 24 hr capsule TAKE 2 CAPSULES BY MOUTH EVERY MORNING AND TAKE 1 CAPSULE BY MOUTH EVERY EVENING 270 capsule 1   No current facility-administered medications for this visit.      ALLERGIES: Patient has no known allergies.  Family History  Problem Relation Age of Onset  . Polymyalgia rheumatica Mother   . Hypertension Father   . Heart disease Father        Dec 69  . Heart attack Father   . Prostate cancer Father        Dx 47s; deceased 65  . Breast cancer Cousin        Dx 38s; daughter of a paternal uncle  . Ovarian cancer Paternal Aunt        Dx 26; deceased  . Ovarian cancer Cousin        Dx 76s; daughter of a paternal uncle  . Ovarian cancer Cousin        Dx 74; daughter of a paternal uncle  . Ovarian cancer Paternal Aunt        Dx 78s.  BRCA positive  . Ovarian cancer Other        pat grandfather's mother    Social History    Socioeconomic History  . Marital status: Married    Spouse name: Not on file  . Number of children: Not on file  . Years of education: Not on file  . Highest education level: Not on file  Occupational History  . Not on file  Social Needs  .  Financial resource strain: Not on file  . Food insecurity:    Worry: Not on file    Inability: Not on file  . Transportation needs:    Medical: Not on file    Non-medical: Not on file  Tobacco Use  . Smoking status: Former Smoker    Packs/day: 1.00    Years: 25.00    Pack years: 25.00    Last attempt to quit: 02/08/1996    Years since quitting: 22.0  . Smokeless tobacco: Never Used  Substance and Sexual Activity  . Alcohol use: Yes    Alcohol/week: 14.0 standard drinks    Types: 14 Glasses of wine per week  . Drug use: No  . Sexual activity: Not Currently    Partners: Male    Birth control/protection: Post-menopausal  Lifestyle  . Physical activity:    Days per week: Not on file    Minutes per session: Not on file  . Stress: Not on file  Relationships  . Social connections:    Talks on phone: Not on file    Gets together: Not on file    Attends religious service: Not on file    Active member of club or organization: Not on file    Attends meetings of clubs or organizations: Not on file    Relationship status: Not on file  . Intimate partner violence:    Fear of current or ex partner: Not on file    Emotionally abused: Not on file    Physically abused: Not on file    Forced sexual activity: Not on file  Other Topics Concern  . Not on file  Social History Narrative  . Not on file    Review of Systems  All other systems reviewed and are negative.   PHYSICAL EXAMINATION:    BP 120/80 (BP Location: Right Arm, Patient Position: Sitting, Cuff Size: Normal)   Pulse 76   Ht 5' 5" (1.651 m)   Wt 146 lb 9.6 oz (66.5 kg)   LMP 02/07/1998 (Approximate)   BMI 24.40 kg/m     General appearance: alert, cooperative and appears  stated age  Pelvic: External genitalia:  no lesions              Urethra:  normal appearing urethra with no masses, tenderness or lesions              Bartholins and Skenes: normal                 Vagina:  Appears to have a blind pouch of the right vaginal apex.  Left vaginal apex with connection to cervix very contracted and small.                Cervix: no lesions by palpation.                 Bimanual Exam:  Uterus:  normal size, contour, position, consistency, mobility, non-tender              Adnexa: no mass, fullness, tenderness           Chaperone was present for exam.  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  Remote history of abnormal pap.  BRCA1 positive.   PLAN  We discussed her vaginal findings.  Follow up pap and HR HPV testing.  Ok to continue vaginal vit E and reduce the usage to twice weekly/ Return for routine well woman visit.    An After Visit Summary was printed  and given to the patient.  __15____ minutes face to face time of which over 50% was spent in counseling.

## 2018-03-08 ENCOUNTER — Other Ambulatory Visit: Payer: Self-pay | Admitting: Hematology

## 2018-03-12 LAB — CYTOLOGY - PAP
DIAGNOSIS: NEGATIVE
HPV (WINDOPATH): NOT DETECTED

## 2018-04-11 ENCOUNTER — Other Ambulatory Visit: Payer: Self-pay | Admitting: Nurse Practitioner

## 2018-04-11 DIAGNOSIS — C50111 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 female breast: Secondary | ICD-10-CM

## 2018-04-11 DIAGNOSIS — Z170 Estrogen receptor positive status [ER+]: Principal | ICD-10-CM

## 2018-05-28 NOTE — Progress Notes (Signed)
3 Month Follow Up   Assessment and Plan:   Chelsea Salinas was seen today for follow-up.  Diagnoses and all orders for this visit:  Essential Hypertension Well controlled with current medications  Monitor blood pressure at home; patient to call if consistently greater than 140/90 Continue DASH diet.   Reminder to go to the ER if any CP, SOB, nausea, dizziness, severe HA, changes vision/speech, left arm numbness and tingling and jaw pain.   Vitamin D Def At goal at last visit;  Continue supplementation  of Vitamin D     IU to maintain goal of 70-100 Will check Vit D level   Allergic state, subsequent encounter Doing well at this time Using Zyrtec and flonase  Anemia due to antineoplastic chemotherapy -     CBC with Differential/Platelet  Eczema, unspecified type Follows with dermatology yearly Doing well at this time  Hot flashes Taking Gabapentin 376m nightly, some benefit Reports long term occurrence  Vitamin D deficiency -     VITAMIN D 25 Hydroxy (Vit-D Deficiency, Fractures)  Uncomplicated asthma, unspecified asthma severity, unspecified whether persistent Doing well at this time Takes singular daily Does not use recuse inhaler  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 breast in female, estrogen receptor positive [place] m[place] Follows with Dr FBurr Medicoevery 6 months. Taking letrozole, next 5 years  Screening cholesterol level -Lipids  Medication management -     CBC with Differential/Platelet -     COMPLETE METABOLIC PANEL WITH GFR -     Magnesium -     Lipid panel -     TSH -     Hemoglobin A1c -     Insulin, random -     VITAMIN D 25 Hydroxy (Vit-D Deficiency, Fractures)    Continue diet and meds as discussed. Further disposition pending results of labs. Discussed med's effects and SE's.   Over 30 minutes of exam, counseling, chart review, and critical decision making was performed.   Future Appointments  Date Time Provider  DSan Bernardino 05/29/2018  2:30 PM MGarnet Sierras NP [facility] None  08/20/2018 12:45 PM [facility] 2 [facility] None  08/20/2018  1:15 PM FTruitt Merle MD [facility] None  12/04/2018  9:00 AM CVicie Mutters PA-C [facility] None  01/14/2019  1:30 PM ANunzio Cobbs MD [facility]    ----------------------------------------------------------------------------------------------------------------------  HPI 63y.o. female  presents for 3 month follow up on HTN, HLD, Hypothyroidism, history of pre-diabetes, weight and vitamin D deficiency.   BMI is There is no height or weight on file to calculate BMI., she has been working on diet and exercise. Wt Readings from Last 3 Encounters:  03/07/18 146 lb 9.6 oz (66.5 kg)  02/12/18 149 lb 6.4 oz (67.8 kg)  01/10/18 147 lb 9.6 oz (67 kg)    Her blood pressure has been controlled at home, today their BP is    She does workout. She denies any cardiac symptoms, chest pains, palpitations, shortness of breath, dizziness or lower extremity edema.     She is not on cholesterol medication Her cholesterol is at goal. The cholesterol last visit was:   Lab Results  Component Value Date   CHOL 196 11/23/2017   HDL 95 11/23/2017   LDLCALC 82 11/23/2017   TRIG 91 11/23/2017   CHOLHDL 2.1 11/23/2017    She has been working on diet and exercise for prediabetes, and denies hyperglycemia, hypoglycemia , increased appetite, nausea, paresthesia of the feet, polydipsia, polyuria and visual  disturbances. Last A1C in the office was:  Lab Results  Component Value Date   HGBA1C 5.5 05/26/2014   Patient is on Vitamin D supplement.   Lab Results  Component Value Date   VD25OH 45 11/23/2017       Current Medications:  Current Outpatient Medications on File Prior to Visit  Medication Sig  . acyclovir ointment (ZOVIRAX) 5 % Apply 1 application topically every 3 (three) hours.  Marland Kitchen aspirin EC 81 MG tablet Take 81 mg by mouth daily.  .  Calcium-Magnesium-Vitamin D (CALCIUM 1200+D3 PO) Take 1 tablet by mouth every evening.  . cetirizine (ZYRTEC) 10 MG tablet Take 10 mg by mouth at bedtime.   . cloNIDine (CATAPRES) 0.2 MG tablet TAKE 1 TABLET(0.2 MG) BY MOUTH AT BEDTIME  . escitalopram (LEXAPRO) 20 MG tablet Take 1 tablet (20 mg total) by mouth daily.  . fluticasone (FLONASE ALLERGY RELIEF) 50 MCG/ACT nasal spray Place 1 spray into both nostrils every morning.   . gabapentin (NEURONTIN) 300 MG capsule TAKE 1 CAPSULE(300 MG) BY MOUTH AT BEDTIME  . letrozole (FEMARA) 2.5 MG tablet TAKE 1 TABLET(2.5 MG) BY MOUTH EVERY MORNING  . losartan (COZAAR) 100 MG tablet TAKE 1 TABLET(100 MG) BY MOUTH DAILY  . montelukast (SINGULAIR) 10 MG tablet TAKE 1 TABLET(10 MG) BY MOUTH DAILY  . triamcinolone cream (KENALOG) 0.1 % Apply 1 application topically 2 (two) times daily as needed (eczema).  . valACYclovir (VALTREX) 500 MG tablet Take 1 tablet (500 mg total) by mouth 2 (two) times daily. Take for 3 days as needed.  . venlafaxine XR (EFFEXOR-XR) 75 MG 24 hr capsule TAKE 2 CAPSULES BY MOUTH EVERY MORNING AND TAKE 1 CAPSULE BY MOUTH EVERY EVENING   No current facility-administered medications on file prior to visit.     Walking daily   Allergies:  No Known Allergies   Medical History:  Past Medical History:  Diagnosis Date  . 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 Breast cancer, right breast ([place]) dx 03/08/2016---  oncolgoist-  [name]   Stage 1A (pT1, pN0, pM0)  Grade 2,  ER+, PR-, HER2 - ;  invasive ductal carcinoma s/p  bilateral mastectomies w/ sln bx's (negative) and completed chemotherapy (05-20-2016 to 09-29-2016)  . Eczema   . Family history of breast cancer   . Family history of genetic disease carrier    paternal relatives with a BRCA mutation  . Family history of ovarian cancer   . History of abnormal cervical Pap smear age 49s  . History of exercise intolerance 06/27/2006   normal  . HSV-2  infection   . Hypertension   . Seasonal and perennial allergic rhinitis   . Wears contact lenses     Family history- Reviewed and unchanged   Social history- Reviewed and unchanged   Names of Other Physician/Practitioners you currently use: 1. [facility] here for primary care 2. Eye Exam [facility], scheduled for May 3. Dentist Scheduled for May  Patient Care Team: [name], MD as PCP - General (Internal Medicine) [name], MD as Consulting Physician (Dermatology) [name] [name], NP as Nurse Practitioner (Hematology and Oncology) Truitt Merle, MD as Consulting Physician (Hematology) [name], MD as Consulting Physician (General Surgery)   Screening Tests: Immunization History  Administered Date(s) Administered  . Influenza Inj Mdck Quad With Preservative 11/22/2016, 11/23/2017  . Influenza, Seasonal, Injecte, Preservative Fre 11/16/2015  . Pneumococcal Polysaccharide-23 11/13/2014  . Td 02/08/1996,  02/07/2006  . Tdap 04/27/2017     Vaccinations: TD or [id number]  Influenza: 2019  Pneumococcal: 2016 Prevnar13: Due in 2021. Shingles/Zostavax: Zostavax,  2015  Due for Shingrix   Preventative Care: Last colonoscopy: 217 Last mammogram: Masectomy 2 years ago, no further screening. Last pap smear/pelvic exam: 2020, [facility] DEXA: 2018, DUE   Review of Systems:  ROS    Physical Exam: LMP 02/07/1998 (Approximate)  Wt Readings from Last 3 Encounters:  03/07/18 146 lb 9.6 oz (66.5 kg)  02/12/18 149 lb 6.4 oz (67.8 kg)  01/10/18 147 lb 9.6 oz (67 kg)   General Appearance: Well nourished, in no apparent distress. Wearing a mask. Eyes: PERRLA, EOMs, conjunctiva no swelling or erythema Sinuses: No Frontal/maxillary tenderness ENT/Mouth: Ext aud canals clear, TMs without erythema, bulging. No erythema, swelling, or exudate on post pharynx.  Tonsils not swollen or erythematous. Hearing normal.  Neck:  Supple, thyroid normal.  Respiratory: Respiratory effort normal, BS equal bilaterally without rales, rhonchi, wheezing or stridor.  Cardio: RRR with no MRGs. Brisk peripheral pulses without edema.  Abdomen: Soft, + BS.  Non tender, no guarding, rebound, hernias, masses. Lymphatics: Non tender without lymphadenopathy.  Musculoskeletal: Full ROM, 5/5 strength, Normal gait Skin: Warm, dry without rashes, lesions, ecchymosis.  Neuro: Cranial nerves intact. No cerebellar symptoms.  Psych: Awake and oriented X 3, normal affect, Insight and Judgment appropriate.    Garnet Sierras, NP [facility] 3:00 PM

## 2018-05-29 ENCOUNTER — Encounter: Payer: Self-pay | Admitting: Adult Health Nurse Practitioner

## 2018-05-29 ENCOUNTER — Other Ambulatory Visit: Payer: Self-pay

## 2018-05-29 ENCOUNTER — Ambulatory Visit: Payer: Self-pay | Admitting: Physician Assistant

## 2018-05-29 ENCOUNTER — Ambulatory Visit: Payer: BLUE CROSS/BLUE SHIELD | Admitting: Adult Health Nurse Practitioner

## 2018-05-29 VITALS — BP 120/88 | HR 85 | Temp 97.4°F | Ht 65.0 in | Wt 145.6 lb

## 2018-05-29 DIAGNOSIS — I10 Essential (primary) hypertension: Secondary | ICD-10-CM

## 2018-05-29 DIAGNOSIS — R7309 Other abnormal glucose: Secondary | ICD-10-CM | POA: Diagnosis not present

## 2018-05-29 DIAGNOSIS — D6481 Anemia due to antineoplastic chemotherapy: Secondary | ICD-10-CM | POA: Diagnosis not present

## 2018-05-29 DIAGNOSIS — R232 Flushing: Secondary | ICD-10-CM | POA: Diagnosis not present

## 2018-05-29 DIAGNOSIS — Z13220 Encounter for screening for lipoid disorders: Secondary | ICD-10-CM

## 2018-05-29 DIAGNOSIS — Z170 Estrogen receptor positive status [ER+]: Secondary | ICD-10-CM

## 2018-05-29 DIAGNOSIS — Z79899 Other long term (current) drug therapy: Secondary | ICD-10-CM | POA: Diagnosis not present

## 2018-05-29 DIAGNOSIS — J45909 Unspecified asthma, uncomplicated: Secondary | ICD-10-CM

## 2018-05-29 DIAGNOSIS — E559 Vitamin D deficiency, unspecified: Secondary | ICD-10-CM

## 2018-05-29 DIAGNOSIS — T451X5A Adverse effect of antineoplastic and immunosuppressive drugs, initial encounter: Secondary | ICD-10-CM

## 2018-05-29 DIAGNOSIS — L309 Dermatitis, unspecified: Secondary | ICD-10-CM | POA: Diagnosis not present

## 2018-05-29 DIAGNOSIS — T7840XD Allergy, unspecified, subsequent encounter: Secondary | ICD-10-CM

## 2018-05-29 DIAGNOSIS — C50111 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 female breast: Secondary | ICD-10-CM

## 2018-05-29 NOTE — Patient Instructions (Addendum)
Today you had an office visit with Chelsea Sierras, DNP.  Below is a summary of your visit.   You are due for the Shingrix vaccination.  You received the Zostavax in 2015.  You may get this an your local pharmacy.  Contact them for available to this.  There may be a wait list.  Be low is some information about this vaccination from [facility], [place].  Shingrix, which was approved by the FDA in October 2017. Doses are given two to six months apart. Shingrix is said to be more than 90% effective against shingles and postherpetic neuralgia - a painful nerve condition that can arise as a shingles complication.  Zostavax has been used since 2006 and has been reported to reduce the risk of shingles by 51%. Research has also shown that Zostavax loses its ability to prevent shingles after five years. If you've ever had chickenpox, you are at risk for shingles, which is essentially a re-emergence of the virus that caused your chickenpox. The CDC recommends to get the Shingrix vaccination even if you aren't sure you've had chickenpox and if you've already had shingles. Although it's uncommon, you can get shingles more than once. In addition, you should get the Shingrix vaccine even if you already got the Zostavax vaccine.  The wait time between these vaccinations is eight weeks.   Risks of Shingrix vaccination:      A sore arm with mild or moderate pain is very common after recombinant shingles vaccine, affecting about 80% of vaccinated people. Redness and swelling can also happen at the site of the injection.     Tiredness, muscle pain, headache, shivering, fever, stomach pain, and nausea happen after vaccination in more than half of people who receive recombinant shingles vaccine.  In clinical trials, about 1 out of 6 people who got recombinant zoster vaccine experienced side effects that prevented them from doing regular activities. Symptoms usually went away on their own  in 2 to 3 days.  You should still get the second dose of recombinant zoster vaccine even if you had one of these reactions after the first dose.  People sometimes faint after medical procedures, including vaccination. Tell your provider if you feel dizzy or have vision changes or ringing in the ears.  As with any medicine, there is a very remote chance of a vaccine causing a severe allergic reaction, other serious injury, or death.    Coronavirus (COVID-19) Are you at risk?  Are you at risk for the Coronavirus (COVID-19)?  To be considered HIGH RISK for Coronavirus (COVID-19), you have to meet the following criteria:  . Traveled to [place], [place], [place], [place] or [place]; or in the [place] to [place], [place], [place]  . or [place]; and have fever, cough, and shortness of breath within the last 2 weeks of travel OR . Been in close contact with a person diagnosed with COVID-19 within the last 2 weeks and have  . fever, cough,and shortness of breath .  . IF YOU DO NOT MEET THESE CRITERIA, YOU ARE CONSIDERED LOW RISK FOR COVID-19.  What to do if you are HIGH RISK for COVID-19?  Marland Kitchen If you are having a medical emergency, call 911. . Seek medical care right away. Before you go to a doctor's office, urgent care or emergency department, .  call ahead and tell them about your recent travel, contact with someone diagnosed with COVID-19  .  and your  symptoms.  . You should receive instructions from your physician's office regarding next steps of care.  . When you arrive at healthcare provider, tell the healthcare staff immediately you have returned from  . visiting [place], [place], [place], [place] or [place]; or traveled in the [place] to [place], [place],  . [place] or [place] in the last two weeks or you have been in close contact with a person diagnosed with  . COVID-19 in the last 2 weeks.   . Tell the health care staff about your symptoms: fever, cough and  shortness of breath. . After you have been seen by a medical provider, you will be either: o Tested for (COVID-19) and discharged home on quarantine except to seek medical care if  o symptoms worsen, and asked to  - Stay home and avoid contact with others until you get your results (4-5 days)  - Avoid travel on public transportation if possible (such as bus, train, or airplane) or o Sent to the Emergency Department by EMS for evaluation, COVID-19 testing  and  o possible admission depending on your condition and test results.  What to do if you are LOW RISK for COVID-19?  Reduce your risk of any infection by using the same precautions used for avoiding the common cold or flu:  Marland Kitchen Wash your hands often with soap and warm water for at least 20 seconds.  If soap and water are not readily available,  . use an alcohol-based hand sanitizer with at least 60% alcohol.  . If coughing or sneezing, cover your mouth and nose by coughing or sneezing into the elbow areas of your shirt or coat, .  into a tissue or into your sleeve (not your hands). . Avoid shaking hands with others and consider head nods or verbal greetings only. . Avoid touching your eyes, nose, or mouth with unwashed hands.  . Avoid close contact with people who are sick. . Avoid places or events with large numbers of people in one location, like concerts or sporting events. . Carefully consider travel plans you have or are making. . If you are planning any travel outside or inside the [place], visit the CDC's Travelers' Health webpage for the latest health notices. . If you have some symptoms but not all symptoms, continue to monitor at home and seek medical attention  . if your symptoms worsen. . If you are having a medical emergency, call 911. >>>>>>>>>>>>>>>>>>>>>>>>>>>>>>>>>>>>>>>>>>>>>>>>>>>>>>> We Do NOT Approve of  [facility] [place] Soliciting Our Patients  To Do Home Visits  & We Do NOT Approve of [employer] > > > > > > > > > > > > > > > > > > > > > > > > > > > > > > > > > > >  > > > >   Preventive Care for Adults  A healthy lifestyle and preventive care can promote health and wellness. Preventive health guidelines for men include the following key practices:  A routine yearly physical is a good way to check with your health care provider about your health and preventative screening. It is a chance to share any concerns and updates on your health and to receive a thorough exam.  Visit your dentist for a routine exam and preventative care every 6 months. Brush your teeth twice a day and floss once a day. Good oral hygiene prevents tooth decay and gum disease.  The frequency of eye exams is based  on your age, health, family medical history, use of contact lenses, and other factors. Follow your health care provider's recommendations for frequency of eye exams.  Eat a healthy diet. Foods such as vegetables, fruits, whole grains, low-fat dairy products, and lean protein foods contain the nutrients you need without too many calories. Decrease your intake of foods high in solid fats, added sugars, and salt. Eat the right amount of calories for you. Get information about a proper diet from your health care provider, if necessary.  Regular physical exercise is one of the most important things you can do for your health. Most adults should get at least 150 minutes of moderate-intensity exercise (any activity that increases your heart rate and causes you to sweat) each week. In addition, most adults need muscle-strengthening exercises on 2 or more days a week.  Maintain a healthy weight. The body mass index (BMI) is a screening tool to identify possible weight problems. It provides an estimate of body fat based on height and weight. Your health care provider can find your BMI and can help you achieve or maintain a healthy weight. For adults 20 years and older:  A BMI below 18.5 is considered  underweight.  A BMI of 18.5 to 24.9 is normal.  A BMI of 25 to 29.9 is considered overweight.  A BMI of 30 and above is considered obese.  Maintain normal blood lipids and cholesterol levels by exercising and minimizing your intake of saturated fat. Eat a balanced diet with plenty of fruit and vegetables. Blood tests for lipids and cholesterol should begin at age 27 and be repeated every 5 years. If your lipid or cholesterol levels are high, you are over 50, or you are at high risk for heart disease, you may need your cholesterol levels checked more frequently. Ongoing high lipid and cholesterol levels should be treated with medicines if diet and exercise are not working.  If you smoke, find out from your health care provider how to quit. If you do not use tobacco, do not start.  Lung cancer screening is recommended for adults aged 12-80 years who are at high risk for developing lung cancer because of a history of smoking. A yearly low-dose CT scan of the lungs is recommended for people who have at least a 30-pack-year history of smoking and are a current smoker or have quit within the past 15 years. A pack year of smoking is smoking an average of 1 pack of cigarettes a day for 1 year (for example: 1 pack a day for 30 years or 2 packs a day for 15 years). Yearly screening should continue until the smoker has stopped smoking for at least 15 years. Yearly screening should be stopped for people who develop a health problem that would prevent them from having lung cancer treatment.  If you choose to drink alcohol, do not have more than 2 drinks per day. One drink is considered to be 12 ounces (355 mL) of beer, 5 ounces (148 mL) of wine, or 1.5 ounces (44 mL) of liquor.  Avoid use of street drugs. Do not share needles with anyone. Ask for help if you need support or instructions about stopping the use of drugs.  High blood pressure causes heart disease and increases the risk of stroke. Your blood  pressure should be checked at least every 1-2 years. Ongoing high blood pressure should be treated with medicines, if weight loss and exercise are not effective.  If you are 31-68 years old,  ask your health care provider if you should take aspirin to prevent heart disease.  Diabetes screening involves taking a blood sample to check your fasting blood sugar level. Testing should be considered at a younger age or be carried out more frequently if you are overweight and have at least 1 risk factor for diabetes.  Colorectal cancer can be detected and often prevented. Most routine colorectal cancer screening begins at the age of 87 and continues through age 21. However, your health care provider may recommend screening at an earlier age if you have risk factors for colon cancer. On a yearly basis, your health care provider may provide home test kits to check for hidden blood in the stool. Use of a small camera at the end of a tube to directly examine the colon (sigmoidoscopy or colonoscopy) can detect the earliest forms of colorectal cancer. Talk to your health care provider about this at age 35, when routine screening begins. Direct exam of the colon should be repeated every 5-10 years through age 33, unless early forms of precancerous polyps or small growths are found.  Hepatitis C blood testing is recommended for all people born from [birth year] through 1965 and any individual with known risks for hepatitis C.  Screening for abdominal aortic aneurysm (AAA)  by ultrasound is recommended for people who have history of high blood pressure or who are current or former smokers.  Healthy men should  receive prostate-specific antigen (PSA) blood tests as part of routine cancer screening. Talk with your health care provider about prostate cancer screening.  Testicular cancer screening is  recommended for adult males. Screening includes self-exam, a health care provider exam, and other screening tests. Consult with your  health care provider about any symptoms you have or any concerns you have about testicular cancer.  Use sunscreen. Apply sunscreen liberally and repeatedly throughout the day. You should seek shade when your shadow is shorter than you. Protect yourself by wearing long sleeves, pants, a wide-brimmed hat, and sunglasses year round, whenever you are outdoors.  Once a month, do a whole-body skin exam, using a mirror to look at the skin on your back. Tell your health care provider about new moles, moles that have irregular borders, moles that are larger than a pencil eraser, or moles that have changed in shape or color.  Stay current with required vaccines (immunizations).  Influenza vaccine. All adults should be immunized every year.  Tetanus, diphtheria, and acellular pertussis (Td, Tdap) vaccine. An adult who has not previously received Tdap or who does not know his vaccine status should receive 1 dose of Tdap. This initial dose should be followed by tetanus and diphtheria toxoids (Td) booster doses every 10 years. Adults with an unknown or incomplete history of completing a 3-dose immunization series with Td-containing vaccines should begin or complete a primary immunization series including a Tdap dose. Adults should receive a Td booster every 10 years.  Zoster vaccine. One dose is recommended for adults aged 47 years or older unless certain conditions are present.    PREVNAR - Pneumococcal 13-valent conjugate (PCV13) vaccine. When indicated, a person who is uncertain of his immunization history and has no record of immunization should receive the PCV13 vaccine. An adult aged 61 years or older who has certain medical conditions and has not been previously immunized should receive 1 dose of PCV13 vaccine. This PCV13 should be followed with a dose of pneumococcal polysaccharide (PPSV23) vaccine. The PPSV23 vaccine dose should be obtained 1 or  more year(s)after the dose of PCV13 vaccine. An adult aged  81 years or older who has certain medical conditions and previously received 1 or more doses of PPSV23 vaccine should receive 1 dose of PCV13. The PCV13 vaccine dose should be obtained 1 or more years after the last PPSV23 vaccine dose.    PNEUMOVAX - Pneumococcal polysaccharide (PPSV23) vaccine. When PCV13 is also indicated, PCV13 should be obtained first. All adults aged 56 years and older should be immunized. An adult younger than age 72 years who has certain medical conditions should be immunized. Any person who resides in a nursing home or long-term care facility should be immunized. An adult smoker should be immunized. People with an immunocompromised condition and certain other conditions should receive both PCV13 and PPSV23 vaccines. People with human immunodeficiency virus (HIV) infection should be immunized as soon as possible after diagnosis. Immunization during chemotherapy or radiation therapy should be avoided. Routine use of PPSV23 vaccine is not recommended for American Indians, [place] Natives, or people younger than 65 years unless there are medical conditions that require PPSV23 vaccine. When indicated, people who have unknown immunization and have no record of immunization should receive PPSV23 vaccine. One-time revaccination 5 years after the first dose of PPSV23 is recommended for people aged 19-64 years who have chronic kidney failure, nephrotic syndrome, asplenia, or immunocompromised conditions. People who received 1-2 doses of PPSV23 before age 31 years should receive another dose of PPSV23 vaccine at age 30 years or later if at least 5 years have passed since the previous dose. Doses of PPSV23 are not needed for people immunized with PPSV23 at or after age 2 years.    Hepatitis A vaccine. Adults who wish to be protected from this disease, have certain high-risk conditions, work with hepatitis A-infected animals, work in hepatitis A research labs, or travel to or work in countries  with a high rate of hepatitis A should be immunized. Adults who were previously unvaccinated and who anticipate close contact with an international adoptee during the first 60 days after arrival in the [place] from a country with a high rate of hepatitis A should be immunized.    Hepatitis B vaccine. Adults should be immunized if they wish to be protected from this disease, have certain high-risk conditions, may be exposed to blood or other infectious body fluids, are household contacts or sex partners of hepatitis B positive people, are clients or workers in certain care facilities, or travel to or work in countries with a high rate of hepatitis B.   Preventive Service / Frequency   Ages 67 and over  Blood pressure check.  Lipid and cholesterol check.  Lung cancer screening. / Every year if you are aged 59-80 years and have a 30-pack-year history of smoking and currently smoke or have quit within the past 15 years. Yearly screening is stopped once you have quit smoking for at least 15 years or develop a health problem that would prevent you from having lung cancer treatment.  Fecal occult blood test (FOBT) of stool. You may not have to do this test if you get a colonoscopy every 10 years.  Flexible sigmoidoscopy** or colonoscopy.** / Every 5 years for a flexible sigmoidoscopy or every 10 years for a colonoscopy beginning at age 37 and continuing until age 48.  Hepatitis C blood test.** / For all people born from [birth year] through 1967 and any individual with known risks for hepatitis C.  Abdominal aortic aneurysm (AAA) screening./ Screening current  or former smokers or have Hypertension.  Skin self-exam. / Monthly.  Influenza vaccine. / Every year.  Tetanus, diphtheria, and acellular pertussis (Tdap/Td) vaccine.** / 1 dose of Td every 10 years.   Zoster vaccine.** / 1 dose for adults aged 17 years or older.         Pneumococcal 13-valent conjugate (PCV13) vaccine.     Pneumococcal polysaccharide (PPSV23) vaccine.     Hepatitis A vaccine.** / Consult your health care provider.  Hepatitis B vaccine.** / Consult your health care provider. Screening for abdominal aortic aneurysm (AAA)  by ultrasound is recommended for people who have history of high blood pressure or who are current or former smokers. ++++++++++ Recommend Adult Low Dose Aspirin or  coated  Aspirin 81 mg daily  To reduce risk of Colon Cancer 20 %,  Skin Cancer 26 % ,  Malignant Melanoma 46%  and  Pancreatic cancer 60% ++++++++++++++++++++++ Vitamin D goal  is between 70-100.  Please make sure that you are taking your Vitamin D as directed.  It is very important as a natural anti-inflammatory  helping hair, skin, and nails, as well as reducing stroke and heart attack risk.  It helps your bones and helps with mood. It also decreases numerous cancer risks so please take it as directed.  Low Vit D is associated with a 200-300% higher risk for CANCER  and 200-300% higher risk for HEART   ATTACK  &  STROKE.   .....................................Marland Kitchen It is also associated with higher death rate at younger ages,  autoimmune diseases like Rheumatoid arthritis, Lupus, Multiple Sclerosis.    Also many other serious conditions, like depression, Alzheimer's Dementia, infertility, muscle aches, fatigue, fibromyalgia - just to name a few. ++++++++++++++++++++++ Recommend the book "The END of DIETING" by Dr Excell Seltzer  & the book "The END of DIABETES " by Dr Excell Seltzer At [email or web address] - get book & Audio CD's    Being diabetic has a  300% increased risk for heart attack, stroke, cancer, and alzheimer- type vascular dementia. It is very important that you work harder with diet by avoiding all foods that are white. Avoid white rice (brown & wild rice is OK), white potatoes (sweetpotatoes in moderation is OK), White bread or wheat bread or anything made out of white flour like bagels, donuts,  rolls, buns, biscuits, cakes, pastries, cookies, pizza crust, and pasta (made from white flour & egg whites) - vegetarian pasta or spinach or wheat pasta is OK. Multigrain breads like Arnold's or Pepperidge [employer], or multigrain sandwich thins or flatbreads.  Diet, exercise and weight loss can reverse and cure diabetes in the early stages.  Diet, exercise and weight loss is very important in the control and prevention of complications of diabetes which affects every system in your body, ie. Brain - dementia/stroke, eyes - glaucoma/blindness, heart - heart attack/heart failure, kidneys - dialysis, stomach - gastric paralysis, intestines - malabsorption, nerves - severe painful neuritis, circulation - gangrene & loss of a leg(s), and finally cancer and Alzheimers.    I recommend avoid fried & greasy foods,  sweets/candy, white rice (brown or wild rice or Quinoa is OK), white potatoes (sweet potatoes are OK) - anything made from white flour - bagels, doughnuts, rolls, buns, biscuits,white and wheat breads, pizza crust and traditional pasta made of white flour & egg white(vegetarian pasta or spinach or wheat pasta is OK).  Multi-grain bread is OK - like multi-grain flat bread or sandwich thins. Avoid alcohol in excess. Exercise  is also important.    Eat all the vegetables you want - avoid meat, especially red meat and dairy - especially cheese.  Cheese is the most concentrated form of trans-fats which is the worst thing to clog up our arteries. Veggie cheese is OK which can be found in the fresh produce section at [employer] or [employer] or [employer]  ++++++++++++++++++++++ DASH Eating Plan  DASH stands for "Dietary Approaches to Stop Hypertension."   The DASH eating plan is a healthy eating plan that has been shown to reduce high blood pressure (hypertension). Additional health benefits may include reducing the risk of type 2 diabetes mellitus, heart disease, and stroke. The DASH eating plan may also help  with weight loss. WHAT DO I NEED TO KNOW ABOUT THE DASH EATING PLAN? For the DASH eating plan, you will follow these general guidelines:  Choose foods with a percent daily value for sodium of less than 5% (as listed on the food label).  Use salt-free seasonings or herbs instead of table salt or sea salt.  Check with your health care provider or pharmacist before using salt substitutes.  Eat lower-sodium products, often labeled as "lower sodium" or "no salt added."  Eat fresh foods.  Eat more vegetables, fruits, and low-fat dairy products.  Choose whole grains. Look for the word "whole" as the first word in the ingredient list.  Choose fish   Limit sweets, desserts, sugars, and sugary drinks.  Choose heart-healthy fats.  Eat veggie cheese   Eat more home-cooked food and less restaurant, buffet, and fast food.  Limit fried foods.  Cook foods using methods other than frying.  Limit canned vegetables. If you do use them, rinse them well to decrease the sodium.  When eating at a restaurant, ask that your food be prepared with less salt, or no salt if possible.                      WHAT FOODS CAN I EAT? Read Dr Fara Olden Fuhrman's books on The End of Dieting & The End of Diabetes  Grains Whole grain or whole wheat bread. Brown rice. Whole grain or whole wheat pasta. Quinoa, bulgur, and whole grain cereals. Low-sodium cereals. Corn or whole wheat flour tortillas. Whole grain cornbread. Whole grain crackers. Low-sodium crackers.  Vegetables Fresh or frozen vegetables (raw, steamed, roasted, or grilled). Low-sodium or reduced-sodium tomato and vegetable juices. Low-sodium or reduced-sodium tomato sauce and paste. Low-sodium or reduced-sodium canned vegetables.   Fruits All fresh, canned (in natural juice), or frozen fruits.  Protein Products  All fish and seafood.  Dried beans, peas, or lentils. Unsalted nuts and seeds. Unsalted canned beans.  Dairy Low-fat dairy products, such  as skim or 1% milk, 2% or reduced-fat cheeses, low-fat ricotta or cottage cheese, or plain low-fat yogurt. Low-sodium or reduced-sodium cheeses.  Fats and Oils Tub margarines without trans fats. Light or reduced-fat mayonnaise and salad dressings (reduced sodium). Avocado. Safflower, olive, or canola oils. Natural peanut or almond butter.  Other Unsalted popcorn and pretzels. The items listed above may not be a complete list of recommended foods or beverages. Contact your dietitian for more options.  ++++++++++++++++++++  WHAT FOODS ARE NOT RECOMMENDED? Grains/ White flour or wheat flour White bread. White pasta. White rice. Refined cornbread. Bagels and croissants. Crackers that contain trans fat.  Vegetables  Creamed or fried vegetables. Vegetables in a . Regular canned vegetables. Regular canned tomato sauce and paste. Regular tomato and vegetable juices.  Fruits Dried fruits. Canned fruit in light or heavy syrup. Fruit juice.  Meat and Other Protein Products Meat in general - RED meat & White meat.  Fatty cuts of meat. Ribs, chicken wings, all processed meats as bacon, sausage, bologna, salami, fatback, hot dogs, bratwurst and packaged luncheon meats.  Dairy Whole or 2% milk, cream, half-and-half, and cream cheese. Whole-fat or sweetened yogurt. Full-fat cheeses or blue cheese. Non-dairy creamers and whipped toppings. Processed cheese, cheese spreads, or cheese curds.  Condiments Onion and garlic salt, seasoned salt, table salt, and sea salt. Canned and packaged gravies. Worcestershire sauce. Tartar sauce. Barbecue sauce. Teriyaki sauce. Soy sauce, including reduced sodium. Steak sauce. Fish sauce. Oyster sauce. Cocktail sauce. Horseradish. Ketchup and mustard. Meat flavorings and tenderizers. Bouillon cubes. Hot sauce. Tabasco sauce. Marinades. Taco seasonings. Relishes.  Fats and Oils Butter, stick margarine, lard, shortening and bacon fat. Coconut, palm kernel, or palm oils.  Regular salad dressings.  Pickles and olives. Salted popcorn and pretzels.  The items listed above may not be a complete list of foods and beverages to avoid.

## 2018-05-30 LAB — LIPID PANEL
Cholesterol: 235 mg/dL — ABNORMAL HIGH (ref <200)
HDL: 100 mg/dL (ref >=50)
LDL Cholesterol (Calc): 105 mg/dL (calc) — ABNORMAL HIGH
Non-HDL Cholesterol (Calc): 135 mg/dL (calc) — ABNORMAL HIGH (ref <130)
Total CHOL/HDL Ratio: 2.4 (calc) (ref <5.0)
Triglycerides: 183 mg/dL — ABNORMAL HIGH (ref <150)

## 2018-05-30 LAB — COMPLETE METABOLIC PANEL WITH GFR
AG Ratio: 2 (calc) (ref 1.0–2.5)
ALT: 16 U/L (ref 6–29)
AST: 20 U/L (ref 10–35)
Albumin: 4.7 g/dL (ref 3.6–5.1)
Alkaline phosphatase (APISO): 89 U/L (ref 37–153)
BUN: 21 mg/dL (ref 7–25)
CO2: 29 mmol/L (ref 20–32)
Calcium: 10.2 mg/dL (ref 8.6–10.4)
Chloride: 100 mmol/L (ref 98–110)
Creat: 0.65 mg/dL (ref 0.50–0.99)
GFR, Est African American: 109 mL/min/{1.73_m2} (ref >=60)
GFR, Est Non African American: 94 mL/min/{1.73_m2} (ref >=60)
Globulin: 2.4 g/dL (calc) (ref 1.9–3.7)
Glucose, Bld: 86 mg/dL (ref 65–99)
Potassium: 4.7 mmol/L (ref 3.5–5.3)
Sodium: 139 mmol/L (ref 135–146)
Total Bilirubin: 0.4 mg/dL (ref 0.2–1.2)
Total Protein: 7.1 g/dL (ref 6.1–8.1)

## 2018-05-30 LAB — CBC WITH DIFFERENTIAL/PLATELET
Absolute Monocytes: 624 cells/uL (ref 200–950)
Basophils Absolute: 33 {cells}/uL (ref 0–200)
Basophils Relative: 0.5 %
Eosinophils Absolute: 52 {cells}/uL (ref 15–500)
Eosinophils Relative: 0.8 %
HCT: 39.8 % (ref 35.0–45.0)
Hemoglobin: 13.8 g/dL (ref 11.7–15.5)
Lymphs Abs: 1664 cells/uL (ref 850–3900)
MCH: 33.4 pg — ABNORMAL HIGH (ref 27.0–33.0)
MCHC: 34.7 g/dL (ref 32.0–36.0)
MCV: 96.4 fL (ref 80.0–100.0)
MPV: 10.7 fL (ref 7.5–12.5)
Monocytes Relative: 9.6 %
Neutro Abs: 4128 cells/uL (ref 1500–7800)
Neutrophils Relative %: 63.5 %
Platelets: 222 10*3/uL (ref 140–400)
RBC: 4.13 10*6/uL (ref 3.80–5.10)
RDW: 12.8 % (ref 11.0–15.0)
Total Lymphocyte: 25.6 %
WBC: 6.5 10*3/uL (ref 3.8–10.8)

## 2018-05-30 LAB — MAGNESIUM: Magnesium: 1.7 mg/dL (ref 1.5–2.5)

## 2018-05-30 LAB — VITAMIN D 25 HYDROXY (VIT D DEFICIENCY, FRACTURES): Vit D, 25-Hydroxy: 56 ng/mL (ref 30–100)

## 2018-05-30 LAB — HEMOGLOBIN A1C
Hgb A1c MFr Bld: 5.2 % of total Hgb (ref <5.7)
Mean Plasma Glucose: 103 (calc)
eAG (mmol/L): 5.7 (calc)

## 2018-05-30 LAB — INSULIN, RANDOM: Insulin: 3.7 u[IU]/mL

## 2018-05-30 LAB — TSH: TSH: 1.42 mIU/L (ref 0.40–4.50)

## 2018-05-31 ENCOUNTER — Encounter: Payer: Self-pay | Admitting: Adult Health Nurse Practitioner

## 2018-06-06 ENCOUNTER — Other Ambulatory Visit: Payer: Self-pay | Admitting: Internal Medicine

## 2018-06-06 DIAGNOSIS — R232 Flushing: Secondary | ICD-10-CM

## 2018-06-06 DIAGNOSIS — I10 Essential (primary) hypertension: Secondary | ICD-10-CM

## 2018-06-18 DIAGNOSIS — H40013 Open angle with borderline findings, low risk, bilateral: Secondary | ICD-10-CM | POA: Diagnosis not present

## 2018-08-06 ENCOUNTER — Other Ambulatory Visit: Payer: Self-pay | Admitting: Physician Assistant

## 2018-08-10 ENCOUNTER — Other Ambulatory Visit: Payer: Self-pay | Admitting: Internal Medicine

## 2018-08-10 DIAGNOSIS — T7840XD Allergy, unspecified, subsequent encounter: Secondary | ICD-10-CM

## 2018-08-10 DIAGNOSIS — J45909 Unspecified asthma, uncomplicated: Secondary | ICD-10-CM

## 2018-08-10 DIAGNOSIS — L309 Dermatitis, unspecified: Secondary | ICD-10-CM

## 2018-08-16 NOTE — Progress Notes (Signed)
[place]   [phone] [phone]   Clinic Follow up Note   Patient Care Team: [name], MD as PCP - General (Internal Medicine) [name], MD as Consulting Physician (Dermatology) [name], [name], NP as Nurse Practitioner (Hematology and Oncology) Truitt Merle, MD as Consulting Physician (Hematology) [name], MD as Consulting Physician (General Surgery)  Date of Service:  08/20/2018  CHIEF COMPLAINT: F/u of right breast cancer   SUMMARY OF ONCOLOGIC HISTORY: Oncology History Overview Note  Cancer Staging 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 breast in female, estrogen receptor positive ([place]) Staging form: Breast, AJCC 8th Edition - Clinical stage from 03/08/2016: Stage IA (cT1c, cN0, cM0, G2, ER: Positive, PR: Negative, HER2: Negative) - Signed by Truitt Merle, MD on 03/15/2016 - Pathologic stage from 04/12/2016: Stage IA (pT1c, pN0, cM0, G2, ER: Positive, PR: Negative, HER2: Negative, Oncotype DX score: 91) - Signed by Truitt Merle, MD on 05/05/2016     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 breast in female, estrogen receptor positive ([place])  03/04/2016 Mammogram   Diagnostic bilateral mammogram and right breast ultrasound showed a 1.7 cm mass in the 12:00 position of the right breast, highly suspicious for malignancy. There is a borderline abnormal right axillary lymph node, also suspicious for metastasis.    03/08/2016 Initial Diagnosis   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 breast in female, estrogen receptor positive ([place])   03/08/2016 Initial Biopsy   Right breast 12:00 core needle biopsy showed invasive ductal carcinoma, grade 2, right axillary lymph node biopsy was negative.   03/08/2016 Receptors her2   ER 20% positive, PR negative, HER-2 negative, Ki-67 20%   03/30/2016 Imaging   MR Bilateral Breast 03/30/2016 IMPRESSION: Known unifocal right breast malignancy. No MRI evidence of  malignancy in the left breast.   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  Genes Analyzed: 43 genes on [facility]'s Common Cancers panel (APC, ATM, AXIN2, BARD1, BMPR1A, BRCA1, BRCA2, BRIP1, CDH1, CDKN2A, CHEK2, DICER1, EPCAM, GREM1, HOXB13, KIT, MEN1, MLH1, MSH2, MSH6, MUTYH, NBN, NF1, PALB2, PDGFRA, PMS2, POLD1, POLE, PTEN, RAD50, RAD51C, RAD51D, SDHA, SDHB, SDHC, SDHD, SMAD4, SMARCA4, STK11, TP53, TSC1, TSC2, VHL).    04/12/2016 Surgery   Bilateral mastectomy and right sentinel lymph node biopsy, by [name]   04/12/2016 Pathology Results   Right breast mastectomy showed invasive grade 2 invasive ductal carcinoma, 1.7 cm, margins were negative, 3 sentinel lymph nodes were negative, left simple mastectomy fibroadenoma, one benign node, no atypia or tumor.    05/04/2016 Oncotype testing   Recurrence score 49, high-risk, predicts 10 year risk of distant recurrence 32% with tamoxifen alone.   05/10/2016 Echocardiogram   Echo on 05/10/16 showed EF 55-60%.   05/20/2016 - 09/29/2016 Chemotherapy   dose dense Adriamycin and Cytoxan, every 2 weeks starting 05/20/16 for 4 cycles, followed by Taxol weekly (Taxol Changed to Abaraxane '100mg'$ /m2 on 09/01/17 due to skin rash).    10/2016 -  Anti-estrogen oral therapy    Anastrozole started in 10/2016 and stopped after 2 weeks due to side effects. Switched to Letrozole 11/30/2016    12/27/2016 Imaging   Bone Density 12/27/16  ASSESSMENT: The BMD measured at Femur Neck Right is 0.938 g/cm2 with a T-score of -0.7. This patient is considered normal according to [facility]) criteria. Site Region Measured Date Measured Age YA BMD Significant CHANGE T-score DualFemur Neck Right 12/27/2016    63.0         -  0.5    0.938 g/cm2 AP Spine  L1-L4      12/27/2016    63.0         0.0     1.200 g/cm2   12/27/2016 Imaging   12/27/2016 Bone Density ASSESSMENT: The BMD measured at Femur Neck Right is  0.938 g/cm2 with a T-score of -0.7.   01/05/2017 Survivorship   Survivorship Clinic with with Gardenia Phlegm, NP     01/09/2017 Surgery   LAPAROSCOPIC SALPINGO OOPHORECTOMY with pelvic washings by Dr. [name] and Dr. [name] 01/09/17  Diagnosis Ovaries and fallopian tubes, bilateral - BENIGN OVARIES AND FALLOPIAN TUBES WITH PARATUBAL CYST - NO MALIGNANCY IDENTIFIED    Breast cancer, right ([place])  04/12/2016 Initial Diagnosis   Breast cancer, right ([place])      CURRENT THERAPY:  Anastrozole started in 10/2016 and stopped after 2 weeks due to side effects. Switched to Letrozole 11/30/2016  INTERVAL HISTORY:  Chelsea Salinas is here for a follow up right breast cancer. She was last seen by me 6 months ago. She presents to the clinic alone. She notes she is doing well. She is keeping herself busy and active. She is tolerating Letrozole well with manageable hot flashes mild joint pain. She notes her BP at her PCP was normal but elevated here. She denies headaches and chest pain. She is on BP meds, losartan and clonidine. She monitors her BP at home.     REVIEW OF SYSTEMS:   Constitutional: Denies fevers, chills or abnormal weight loss (+) manageable hot flashes  Eyes: Denies blurriness of vision Ears, nose, mouth, throat, and face: Denies mucositis or sore throat Respiratory: Denies cough, dyspnea or wheezes Cardiovascular: Denies palpitation, chest discomfort or lower extremity swelling Gastrointestinal:  Denies nausea, heartburn or change in bowel habits Skin: Denies abnormal skin rashes MSK: (+) Mild joint pain  Lymphatics: Denies new lymphadenopathy or easy bruising Neurological:Denies numbness, tingling or new weaknesses Behavioral/Psych: Mood is stable, no new changes  All other systems were reviewed with the patient and are negative.  MEDICAL HISTORY:  Past Medical History:  Diagnosis Date  . BRCA1 positive    BRCA1 mutation c.2138C>G (p.Ser713*) @  [facility]  . Breast cancer, right breast ([place]) dx 03/08/2016---  oncolgoist-  [name]   Stage 1A (pT1, pN0, pM0)  Grade 2,  ER+, PR-, HER2 - ;  invasive ductal carcinoma s/p  bilateral mastectomies w/ sln bx's (negative) and completed chemotherapy (05-20-2016 to 09-29-2016)  . Eczema   . Family history of breast cancer   . Family history of genetic disease carrier    paternal relatives with a BRCA mutation  . Family history of ovarian cancer   . History of abnormal cervical Pap smear age 54s  . History of exercise intolerance 06/27/2006   normal  . HSV-2 infection   . Hypertension   . Seasonal and perennial allergic rhinitis   . Wears contact lenses     SURGICAL HISTORY: Past Surgical History:  Procedure Laterality Date  . BREAST BIOPSY Right 04/13/2016   Procedure: Evacuation RIGHT Breast  Hematoma;  Surgeon: [name], MD;  Location: [facility];  Service: General;  Laterality: Right;  . COLONOSCOPY    . DILATION AND CURETTAGE OF UTERUS  2008  . LAPAROSCOPIC SALPINGO OOPHERECTOMY Bilateral 01/09/2017   Procedure: LAPAROSCOPIC SALPINGO OOPHORECTOMY with pelvic washings;  Surgeon: [name], MD;  Location: [facility];  Service: Gynecology;  Laterality: Bilateral;  . MASTECTOMY  W/ SENTINEL NODE BIOPSY Bilateral 04/12/2016   Procedure: BILATERAL TOTAL MASTECTOMIES  WITH RIGHT SENTINEL LYMPH NODE BIOPSY;  Surgeon: [name], MD;  Location: [facility];  Service: General;  Laterality: Bilateral;  . PORTACATH PLACEMENT Right 05/19/2016   Procedure: INSERTION PORT-A-CATH WITH [place];  Surgeon: [name], MD;  Location: [facility];  Service: General;  Laterality: Right;  . TRANSTHORACIC ECHOCARDIOGRAM  05/10/2016   ef 31-54%, grade 1 diastolic dyfuntion/  trivial TR    I have reviewed the social history and family history with the patient and they are unchanged from previous note.  ALLERGIES:  has No Known Allergies.  MEDICATIONS:  Current Outpatient  Medications  Medication Sig Dispense Refill  . acyclovir ointment (ZOVIRAX) 5 % Apply 1 application topically every 3 (three) hours. 15 g 1  . aspirin EC 81 MG tablet Take 81 mg by mouth daily.    . Calcium-Magnesium-Vitamin D (CALCIUM 1200+D3 PO) Take 1 tablet by mouth every evening.    . cetirizine (ZYRTEC) 10 MG tablet Take 10 mg by mouth at bedtime.     . cloNIDine (CATAPRES) 0.2 MG tablet Take 1 tablet at Bedtime for BP 90 tablet 3  . escitalopram (LEXAPRO) 20 MG tablet Take 1 tablet (20 mg total) by mouth daily. 90 tablet 3  . fluticasone (FLONASE ALLERGY RELIEF) 50 MCG/ACT nasal spray Place 1 spray into both nostrils every morning.     . gabapentin (NEURONTIN) 300 MG capsule TAKE 1 CAPSULE(300 MG) BY MOUTH AT BEDTIME 90 capsule 1  . letrozole (FEMARA) 2.5 MG tablet Take 1 tablet (2.5 mg total) by mouth daily. 90 tablet 3  . losartan (COZAAR) 100 MG tablet Take 1 tablet Daily for BP 90 tablet 1  . montelukast (SINGULAIR) 10 MG tablet Take 1 tablet Daily for Allergies 90 tablet 3  . triamcinolone cream (KENALOG) 0.1 % Apply 1 application topically 2 (two) times daily as needed (eczema). 453.6 g 1  . valACYclovir (VALTREX) 500 MG tablet Take 1 tablet (500 mg total) by mouth 2 (two) times daily. Take for 3 days as needed. 30 tablet 1  . venlafaxine XR (EFFEXOR-XR) 75 MG 24 hr capsule TAKE 2 CAPSULE BY MOUTH EVERY MORNING AND TAKE 1 CAPSULE BY MOUTH EVERY EVENING 270 capsule 1   No current facility-administered medications for this visit.     PHYSICAL EXAMINATION: ECOG PERFORMANCE STATUS: 0 - Asymptomatic  Vitals:   08/20/18 1309  BP: (!) 176/93  Pulse: 67  Resp: 17  Temp: 97.8 F (36.6 C)  SpO2: 100%   Filed Weights   08/20/18 1309  Weight: 145 lb 9.6 oz (66 kg)    GENERAL:alert, no distress and comfortable SKIN: skin color, texture, turgor are normal, no significant lesions (+) Mild upper arm skin rash from sun exposure EYES: normal, Conjunctiva are pink and non-injected,  sclera clear  NECK: supple, thyroid normal size, non-tender, without nodularity LYMPH:  no palpable lymphadenopathy in the cervical, axillary  LUNGS: clear to auscultation and percussion with normal breathing effort HEART: regular rate & rhythm and no murmurs and no lower extremity edema ABDOMEN:abdomen soft, non-tender and normal bowel sounds Musculoskeletal:no cyanosis of digits and no clubbing  NEURO: alert & oriented x 3 with fluent speech, no focal motor/sensory deficits BREAST: S/p b/l mastectomy: surgical incisions healed well. No palpable mass, nodules or adenopathy bilaterally. Breast exam benign.    LABORATORY DATA:  I have reviewed the data as listed CBC Latest Ref Rng & Units 08/20/2018 05/29/2018 02/12/2018  WBC  4.0 - 10.5 K/uL 5.1 6.5 5.1  Hemoglobin 12.0 - 15.0 g/dL 13.4 13.8 13.2  Hematocrit 36.0 - 46.0 % 40.0 39.8 40.0  Platelets 150 - 400 K/uL 210 222 226     CMP Latest Ref Rng & Units 08/20/2018 05/29/2018 02/12/2018  Glucose 70 - 99 mg/dL 103(H) 86 99  BUN 8 - 23 mg/dL '17 21 17  '$ Creatinine 0.44 - 1.00 mg/dL 0.74 0.65 0.85  Sodium 135 - 145 mmol/L 142 139 141  Potassium 3.5 - 5.1 mmol/L 4.0 4.7 4.9  Chloride 98 - 111 mmol/L 104 100 103  CO2 22 - 32 mmol/L '28 29 30  '$ Calcium 8.9 - 10.3 mg/dL 9.3 10.2 9.9  Total Protein 6.5 - 8.1 g/dL 7.4 7.1 7.2  Total Bilirubin 0.3 - 1.2 mg/dL 0.5 0.4 0.5  Alkaline Phos 38 - 126 U/L 93 - 94  AST 15 - 41 U/L '22 20 17  '$ ALT 0 - 44 U/L '17 16 16      '$ RADIOGRAPHIC STUDIES: I have personally reviewed the radiological images as listed and agreed with the findings in the report. No results found.   ASSESSMENT & PLAN:  Chelsea Salinas is a 63 y.o. female with   1. 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 Oncotype RS 49, high risk  -She was diagnosed in 02/2016. She is s/p b/l mastectomy and BSO due to her BRCA1+ mutation. She  completed adjuvant chemo AC-T.  -She started antiestrogen with Anastrozole in 10/2016 and stopped after 2 weeks due to side effects. Switched to Letrozole 11/30/2016. She tolerated letrozole well with manageable hot flashes and joint pain. Plan for at least 5-7 years.   -She is clinically doing well. Lab reviewed, her CBC and CMP are within normal limits. Her physical exam and was unremarkable. There is no clinical concern for recurrence. -she has been 2 years out of her initial diagnosis, risk of recurrence decreases overtime given her weakly ER positive disease  -Given she has had a b/l mastectomy there isno need for mammograms.  -Continue letrozole -F/u in 6 months  2. BRCA1 mutation (+) -We previously discussed the high risk of breast cancer and ovarian cancer due to the BRCA1 mutation. She is status post bilateral mastectomy and BSO. -We previously discussed slightly increased risk of pancreatic cancer, however she does not have any family history of pancreatic cancer, does not meet the criteria for routine pancreatic cancer screening. -Her sister has done genetic testing, results were negative. She has no children   3. Bone Health -Her 12/2016 DEXA was normal, will repeat in 12/2018  -Continue VitD and calcium daily     Plan:  -She is clinically doing well.  -Continue letrozole, refilled today  -Lab and f/u in 6 months    No problem-specific Assessment & Plan notes found for this encounter.   No orders of the defined types were placed in this encounter.  All questions were answered. The patient knows to call the clinic with any problems, questions or concerns. No barriers to learning was detected. I spent 15 minutes counseling the patient face to face. The total time spent in the appointment was 20 minutes and more than 50% was on counseling and review of test results     Truitt Merle, MD 08/20/2018   I, Joslyn Devon, am acting as scribe for Truitt Merle, MD.   I have  reviewed the above documentation  for accuracy and completeness, and I agree with the above.

## 2018-08-20 ENCOUNTER — Other Ambulatory Visit: Payer: Self-pay

## 2018-08-20 ENCOUNTER — Encounter: Payer: Self-pay | Admitting: Hematology

## 2018-08-20 ENCOUNTER — Inpatient Hospital Stay: Payer: BC Managed Care – PPO | Attending: Hematology

## 2018-08-20 ENCOUNTER — Telehealth: Payer: Self-pay | Admitting: Hematology

## 2018-08-20 ENCOUNTER — Inpatient Hospital Stay (HOSPITAL_BASED_OUTPATIENT_CLINIC_OR_DEPARTMENT_OTHER): Payer: BC Managed Care – PPO | Admitting: Hematology

## 2018-08-20 VITALS — BP 176/93 | HR 67 | Temp 97.8°F | Resp 17 | Ht 65.0 in | Wt 145.6 lb

## 2018-08-20 DIAGNOSIS — Z170 Estrogen receptor positive status [ER+]: Secondary | ICD-10-CM

## 2018-08-20 DIAGNOSIS — N951 Menopausal and female climacteric states: Secondary | ICD-10-CM | POA: Insufficient documentation

## 2018-08-20 DIAGNOSIS — Z1509 Genetic susceptibility to other malignant neoplasm: Secondary | ICD-10-CM

## 2018-08-20 DIAGNOSIS — I10 Essential (primary) hypertension: Secondary | ICD-10-CM | POA: Insufficient documentation

## 2018-08-20 DIAGNOSIS — C50111 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 female breast: Secondary | ICD-10-CM | POA: Diagnosis not present

## 2018-08-20 DIAGNOSIS — Z1501 Genetic susceptibility to malignant neoplasm of breast: Secondary | ICD-10-CM

## 2018-08-20 DIAGNOSIS — Z9013 Acquired absence of bilateral breasts and nipples: Secondary | ICD-10-CM | POA: Diagnosis not present

## 2018-08-20 DIAGNOSIS — M2550 Pain in unspecified joint: Secondary | ICD-10-CM | POA: Insufficient documentation

## 2018-08-20 LAB — CBC WITH DIFFERENTIAL/PLATELET
Abs Immature Granulocytes: 0.01 10*3/uL (ref 0.00–0.07)
Basophils Absolute: 0.1 10*3/uL (ref 0.0–0.1)
Basophils Relative: 1 %
Eosinophils Absolute: 0.1 10*3/uL (ref 0.0–0.5)
Eosinophils Relative: 2 %
HCT: 40 % (ref 36.0–46.0)
Hemoglobin: 13.4 g/dL (ref 12.0–15.0)
Immature Granulocytes: 0 %
Lymphocytes Relative: 30 %
Lymphs Abs: 1.5 10*3/uL (ref 0.7–4.0)
MCH: 33.5 pg (ref 26.0–34.0)
MCHC: 33.5 g/dL (ref 30.0–36.0)
MCV: 100 fL (ref 80.0–100.0)
Monocytes Absolute: 0.6 10*3/uL (ref 0.1–1.0)
Monocytes Relative: 12 %
Neutro Abs: 2.8 10*3/uL (ref 1.7–7.7)
Neutrophils Relative %: 55 %
Platelets: 210 10*3/uL (ref 150–400)
RBC: 4 MIL/uL (ref 3.87–5.11)
RDW: 12.3 % (ref 11.5–15.5)
WBC: 5.1 10*3/uL (ref 4.0–10.5)
nRBC: 0 % (ref 0.0–0.2)

## 2018-08-20 LAB — CMP (CANCER CENTER ONLY)
ALT: 17 U/L (ref 0–44)
AST: 22 U/L (ref 15–41)
Albumin: 4.3 g/dL (ref 3.5–5.0)
Alkaline Phosphatase: 93 U/L (ref 38–126)
Anion gap: 10 (ref 5–15)
BUN: 17 mg/dL (ref 8–23)
CO2: 28 mmol/L (ref 22–32)
Calcium: 9.3 mg/dL (ref 8.9–10.3)
Chloride: 104 mmol/L (ref 98–111)
Creatinine: 0.74 mg/dL (ref 0.44–1.00)
GFR, Est AFR Am: >60 mL/min (ref >60)
GFR, Estimated: >60 mL/min (ref >60)
Glucose, Bld: 103 mg/dL — ABNORMAL HIGH (ref 70–99)
Potassium: 4 mmol/L (ref 3.5–5.1)
Sodium: 142 mmol/L (ref 135–145)
Total Bilirubin: 0.5 mg/dL (ref 0.3–1.2)
Total Protein: 7.4 g/dL (ref 6.5–8.1)

## 2018-08-20 MED ORDER — LETROZOLE 2.5 MG PO TABS: 2.5000 mg | ORAL_TABLET | Freq: Every day | ORAL | 3 refills | Status: DC

## 2018-08-20 NOTE — Telephone Encounter (Signed)
Scheduled appt per 7/13 los. °Printed and mailed appt calendar. °

## 2018-09-04 ENCOUNTER — Other Ambulatory Visit: Payer: Self-pay | Admitting: Hematology

## 2018-10-19 ENCOUNTER — Other Ambulatory Visit: Payer: Self-pay | Admitting: Hematology

## 2018-10-19 DIAGNOSIS — Z170 Estrogen receptor positive status [ER+]: Secondary | ICD-10-CM

## 2018-10-19 DIAGNOSIS — C50111 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 female breast: Secondary | ICD-10-CM

## 2018-11-13 ENCOUNTER — Other Ambulatory Visit: Payer: Self-pay

## 2018-11-13 MED ORDER — VENLAFAXINE HCL ER 75 MG PO CP24: ORAL_CAPSULE | ORAL | 1 refills | Status: DC

## 2018-11-15 ENCOUNTER — Other Ambulatory Visit: Payer: Self-pay | Admitting: *Deleted

## 2018-11-15 DIAGNOSIS — I10 Essential (primary) hypertension: Secondary | ICD-10-CM

## 2018-11-15 MED ORDER — LOSARTAN POTASSIUM 100 MG PO TABS: ORAL_TABLET | ORAL | 1 refills | Status: DC

## 2018-12-03 NOTE — Progress Notes (Deleted)
Complete Physical  Assessment and Plan:  Essential hypertension - continue medications, DASH diet, exercise and monitor at home. Call if greater than 130/80.  -     CBC with Differential/Platelet -     BASIC METABOLIC PANEL WITH GFR -     Hepatic function panel -     TSH -     Urinalysis, Routine w reflex microscopic -     Microalbumin / creatinine urine ratio -     losartan (COZAAR) 100 MG tablet; Take 1 tablet (100 mg total) by mouth daily.  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 breast in female, estrogen receptor positive ([place]) Continue follow up  BRCA1 positive Continue follow up  Vitamin D deficiency -     VITAMIN D 25 Hydroxy (Vit-D Deficiency, Fractures)  Allergic state, subsequent encounter -     montelukast (SINGULAIR) 10 MG tablet; TAKE 1 TABLET(10 MG) BY MOUTH DAILY  Uncomplicated asthma, unspecified asthma severity, unspecified whether persistent -     montelukast (SINGULAIR) 10 MG tablet; TAKE 1 TABLET(10 MG) BY MOUTH DAILY  Eczema, unspecified type -    Refilled triamcinolone cream  Screening cholesterol level -     Lipid panel  Medication management -     Magnesium  Routine general medical examination at a health care facility 1 year  Needs flu shot -     FLU VACCINE MDCK QUAD W/Preservative  Hot flashes -  Continue medications   Discussed med's effects and SE's. Screening labs and tests as requested with regular follow-up as recommended. Future Appointments  Date Time Provider [place]  12/04/2018  9:00 AM Chelsea Mutters, PA-C [facility] None  01/14/2019  1:30 PM Chelsea Cobbs, MD [place] None  02/20/2019 12:45 PM [facility] 2 [facility] None  02/20/2019  1:15 PM Chelsea Merle, MD [facility] None  12/04/2019  9:00 AM Chelsea Mutters, PA-C [facility] None    HPI 63 y.o. female  presents for a complete physical.  Her blood pressure has been controlled at home, today their BP is   She does workout, yoga 4-5 x a  week, walking, riding, and has been doing [facility] [employer] at [facility]. She is officially retired.   She denies chest pain, shortness of breath, dizziness.  She was diagnosed with right breast cancer ER +, PR+ HER2 - 02/2016, following with Dr. Burr [facility], she is on letrozole, had double mastectomy with Dr. Donne [facility] 04/12/2016, BRCA 1+, had BSP dec 2018.  She is not on cholesterol medication and denies myalgias. Her cholesterol is at goal. The cholesterol last visit was:   Lab Results  Component Value Date   CHOL 235 (H) 05/29/2018   HDL 100 05/29/2018   LDLCALC 105 (H) 05/29/2018   TRIG 183 (H) 05/29/2018   CHOLHDL 2.4 05/29/2018   Last A1C in the office was:  Lab Results  Component Value Date   HGBA1C 5.2 05/29/2018   Patient is on Vitamin D supplement, one a day, unsure of dosage.  Lab Results  Component Value Date   VD25OH 56 05/29/2018   She follows with Dr. Quincy [facility].  She has atopy with eczema, allergies, and asthma. She is on flonase for allergies, needs refill albuterol, has not been using it.  She is on lexapro and catapress for hot flashes that help.  Wt Readings from Last 3 Encounters:  08/20/18 145 lb 9.6 oz (66 kg)  05/29/18 145 lb 9.6 oz (66 kg)  03/07/18 146 lb 9.6 oz (66.5 kg)  Current Medications:  Current Outpatient Medications on File Prior to Visit  Medication Sig Dispense Refill  . acyclovir ointment (ZOVIRAX) 5 % Apply 1 application topically every 3 (three) hours. 15 g 1  . aspirin EC 81 MG tablet Take 81 mg by mouth daily.    . Calcium-Magnesium-Vitamin D (CALCIUM 1200+D3 PO) Take 1 tablet by mouth every evening.    . cetirizine (ZYRTEC) 10 MG tablet Take 10 mg by mouth at bedtime.     . cloNIDine (CATAPRES) 0.2 MG tablet Take 1 tablet at Bedtime for BP 90 tablet 3  . escitalopram (LEXAPRO) 20 MG tablet Take 1 tablet (20 mg total) by mouth daily. 90 tablet 3  . fluticasone (FLONASE ALLERGY RELIEF) 50 MCG/ACT nasal spray Place 1 spray into both nostrils every  morning.     . gabapentin (NEURONTIN) 300 MG capsule TAKE 1 CAPSULE(300 MG) BY MOUTH AT BEDTIME 90 capsule 1  . letrozole (FEMARA) 2.5 MG tablet TAKE 1 TABLET(2.5 MG) BY MOUTH EVERY MORNING 90 tablet 3  . losartan (COZAAR) 100 MG tablet Take 1 tablet Daily for BP 90 tablet 1  . montelukast (SINGULAIR) 10 MG tablet Take 1 tablet Daily for Allergies 90 tablet 3  . triamcinolone cream (KENALOG) 0.1 % Apply 1 application topically 2 (two) times daily as needed (eczema). 453.6 g 1  . valACYclovir (VALTREX) 500 MG tablet Take 1 tablet (500 mg total) by mouth 2 (two) times daily. Take for 3 days as needed. 30 tablet 1  . venlafaxine XR (EFFEXOR-XR) 75 MG 24 hr capsule TAKE 2 CAPSULE BY MOUTH EVERY MORNING AND TAKE 1 CAPSULE BY MOUTH EVERY EVENING 270 capsule 1   No current facility-administered medications on file prior to visit.    Health Maintenance:   Immunization History  Administered Date(s) Administered  . Influenza Inj Mdck Quad With Preservative 11/22/2016, 11/23/2017  . Influenza, Seasonal, Injecte, Preservative Fre 11/16/2015  . Pneumococcal Polysaccharide-23 11/13/2014  . Td 02/08/1996, 02/07/2006  . Tdap 04/27/2017   Tetanus: 2019 at [facility] Pneumovax: 2016 Prevnar 13: due age 28 Flu vaccine: TODAY Zostavax: 2015 at [facility]  Pap: 04/2016, q 6 months,  Dr. Quincy [facility]inas.  MGM: 02/2016 s/p masectomy DEXA: 12/2016 normal at OB/GYN  Colonoscopy: Nov 2017 Dr. Watt [facility] 10 years EGD: N/A DEE: 1 year ago Stress test 2008 normal Echo 2018  Patient Care Team: Chelsea [name], MD as PCP - General (Internal Medicine) Chelsea Brownie, MD as Consulting Physician (Dermatology) [facility], [name], NP as Nurse Practitioner (Hematology and Oncology) Chelsea Merle, MD as Consulting Physician (Hematology) [name], MD as Consulting Physician (General Surgery)   Medical History:  Past Medical History:  Diagnosis Date  . BRCA1 positive    BRCA1 mutation c.2138C>G (p.Ser713*) @  [facility]  . Breast cancer, right breast ([place]) dx 03/08/2016---  oncolgoist-  dr [facility]   Stage 1A (pT1, pN0, pM0)  Grade 2,  ER+, PR-, HER2 - ;  invasive ductal carcinoma s/p  bilateral mastectomies w/ sln bx's (negative) and completed chemotherapy (05-20-2016 to 09-29-2016)  . Eczema   . Family history of breast cancer   . Family history of genetic disease carrier    paternal relatives with a BRCA mutation  . Family history of ovarian cancer   . History of abnormal cervical Pap smear age 33s  . History of exercise intolerance 06/27/2006   normal  . HSV-2 infection   . Hypertension   . Seasonal and perennial allergic rhinitis   . Wears contact lenses  Allergies No Known Allergies  SURGICAL HISTORY She  has a past surgical history that includes Dilation and curettage of uterus (2008); Mastectomy w/ sentinel node biopsy (Bilateral, 04/12/2016); Breast biopsy (Right, 04/13/2016); Colonoscopy; Portacath placement (Right, 05/19/2016); transthoracic echocardiogram (05/10/2016); and Laparoscopic salpingo oophorectomy (Bilateral, 01/09/2017). FAMILY HISTORY Her family history includes Breast cancer in her cousin; Heart attack in her father; Heart disease in her father; Hypertension in her father; Ovarian cancer in her cousin, cousin, paternal aunt, paternal aunt, and another family member; Polymyalgia rheumatica in her mother; Prostate cancer in her father. SOCIAL HISTORY She  reports that she quit smoking about 22 years ago. She has a 25.00 pack-year smoking history. She has never used smokeless tobacco. She reports current alcohol use of about 14.0 standard drinks of alcohol per week. She reports that she does not use drugs.   Review of Systems  Constitutional: Negative.   HENT: Negative for congestion, ear discharge, ear pain, hearing loss, nosebleeds, sore throat and tinnitus.   Eyes: Negative.   Respiratory: Negative.  Negative for stridor.   Cardiovascular: Negative.   Gastrointestinal:  Negative.   Genitourinary: Negative.   Musculoskeletal: Negative.   Skin: Negative for itching and rash.  Neurological: Negative.  Negative for headaches.  Endo/Heme/Allergies: Negative.   Psychiatric/Behavioral: Negative.      Physical Exam: Estimated body mass index is 24.23 kg/m as calculated from the following:   Height as of 08/20/18: 5' 5"  (1.651 m).   Weight as of 08/20/18: 145 lb 9.6 oz (66 kg). LMP 02/07/1998 (Approximate)  General Appearance: Well nourished, in no apparent distress. Eyes: PERRLA, EOMs, conjunctiva no swelling or erythema, normal fundi and vessels. Sinuses: No Frontal/maxillary tenderness ENT/Mouth: Ext aud canals clear, normal light reflex with TMs without erythema, bulging.  Good dentition. No erythema, swelling, or exudate on post pharynx. Tonsils not swollen or erythematous. Hearing normal.  Neck: Supple, thyroid normal. No bruits Respiratory: Respiratory effort normal, BS equal bilaterally without rales, rhonchi, wheezing or stridor. Cardio: RRR without murmurs, rubs or gallops. Brisk peripheral pulses without edema.  Chest: symmetric, with normal excursions and percussion. Breasts: defer Abdomen: Soft, +BS. Non tender, no guarding, rebound, hernias, masses, or organomegaly. .  Lymphatics: Non tender without lymphadenopathy.  Genitourinary: defer Musculoskeletal: Full ROM all peripheral extremities,5/5 strength, and normal gait. Skin: + dry scaly patches bilateral hands. Warm, dry without lesions, ecchymosis.  Neuro: Cranial nerves intact, reflexes equal bilaterally. Normal muscle tone, no cerebellar symptoms. Sensation intact.  Psych: Awake and oriented X 3, normal affect, Insight and Judgment appropriate.    [facility] 9:49 AM

## 2018-12-04 ENCOUNTER — Encounter: Payer: Self-pay | Admitting: Physician Assistant

## 2018-12-12 NOTE — Progress Notes (Signed)
WELCOME TO MEDICARE  Assessment and Plan:  Essential hypertension - continue medications, DASH diet, exercise and monitor at home. Call if greater than 130/80.  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 breast in female, estrogen receptor positive ([place]) Continue follow up  BRCA1 positive Continue follow up  Vitamin D deficiency -     VITAMIN D 25 Hydroxy (Vit-D Deficiency, Fractures)  Allergic state, subsequent encounter -     montelukast (SINGULAIR) 10 MG tablet; TAKE 1 TABLET(10 MG) BY MOUTH DAILY  Uncomplicated asthma, unspecified asthma severity, unspecified whether persistent -     montelukast (SINGULAIR) 10 MG tablet; TAKE 1 TABLET(10 MG) BY MOUTH DAILY  Eczema, unspecified type -    Refilled triamcinolone cream  Screening cholesterol level -     Lipid panel  Medication management -     Magnesium  WELCOME TO MEDICARE 1 year  Hot flashes -  Continue medications   Discussed med's effects and SE's. Screening labs and tests as requested with regular follow-up as recommended. Future Appointments  Date Time Provider [place]  01/14/2019  1:30 PM [name], MD [place] None  02/20/2019 12:45 PM [facility] None  02/20/2019  1:20 PM Truitt Merle, MD [place] None  12/17/2019  9:00 AM [name], PA-C [facility] None    HPI 63 y.o. female  presents for [place]  Her blood pressure has been controlled at home, today their BP is BP: 118/74 She does workout, she is walking, she is riding horses, volunteering. She is officially retired.   She denies chest pain, shortness of breath, dizziness.   She was diagnosed with right breast cancer ER +, PR+ HER2 - 02/2016, following with Dr. Burr Medico, she is on letrozole,  had double mastectomy with [name] 04/12/2016, BRCA 1+, had BSP dec 2018.  She is not on cholesterol medication and denies myalgias. Her cholesterol is at goal. The cholesterol last visit was:   Lab Results  Component Value Date   CHOL 235 (H) 05/29/2018   HDL 100 05/29/2018   LDLCALC 105 (H) 05/29/2018   TRIG 183 (H) 05/29/2018   CHOLHDL 2.4 05/29/2018   Last A1C in the office was:  Lab Results  Component Value Date   HGBA1C 5.2 05/29/2018   Patient is on Vitamin D supplement, one a day, unsure of dosage.  Lab Results  Component Value Date   VD25OH 56 05/29/2018   She follows with Dr. Quincy Simmonds.  She has atopy with eczema, allergies, and asthma. She is on flonase for allergies, needs refill albuterol, has not been using it.  She is on lexapro, effexor and catapress for hot flashes that help.  Wt Readings from Last 3 Encounters:  12/17/18 145 lb 3.2 oz (65.9 kg)  08/20/18 145 lb 9.6 oz (66 kg)  05/29/18 145 lb 9.6 oz (66 kg)    Current Medications:  Current Outpatient Medications on File Prior to Visit  Medication Sig Dispense Refill  . acyclovir ointment (ZOVIRAX) 5 % Apply 1 application topically every 3 (three) hours. 15 g 1  . aspirin EC 81 MG tablet Take 81 mg by mouth daily.    . Calcium-Magnesium-Vitamin D (  CALCIUM 1200+D3 PO) Take 1 tablet by mouth every evening.    . cetirizine (ZYRTEC) 10 MG tablet Take 10 mg by mouth at bedtime.     . cloNIDine (CATAPRES) 0.2 MG tablet Take 1 tablet at Bedtime for BP 90 tablet 3  . escitalopram (LEXAPRO) 20 MG tablet Take 1 tablet (20 mg total) by mouth daily. 90 tablet 3  . fluticasone (FLONASE ALLERGY RELIEF) 50 MCG/ACT nasal spray Place 1 spray into both nostrils every morning.     . gabapentin (NEURONTIN) 300 MG capsule TAKE 1 CAPSULE(300 MG) BY MOUTH AT BEDTIME 90 capsule 1  . letrozole (FEMARA) 2.5 MG tablet TAKE 1 TABLET(2.5 MG) BY MOUTH EVERY MORNING 90 tablet 3  . losartan (COZAAR) 100 MG tablet Take 1 tablet Daily for  BP 90 tablet 1  . montelukast (SINGULAIR) 10 MG tablet Take 1 tablet Daily for Allergies 90 tablet 3  . triamcinolone cream (KENALOG) 0.1 % Apply 1 application topically 2 (two) times daily as needed (eczema). 453.6 g 1  . valACYclovir (VALTREX) 500 MG tablet Take 1 tablet (500 mg total) by mouth 2 (two) times daily. Take for 3 days as needed. 30 tablet 1  . venlafaxine XR (EFFEXOR-XR) 75 MG 24 hr capsule TAKE 2 CAPSULE BY MOUTH EVERY MORNING AND TAKE 1 CAPSULE BY MOUTH EVERY EVENING 270 capsule 1   No current facility-administered medications on file prior to visit.    Health Maintenance:   Immunization History  Administered Date(s) Administered  . Influenza Inj Mdck Quad Pf 11/01/2018  . Influenza Inj Mdck Quad With Preservative 11/22/2016, 11/23/2017  . Influenza, Seasonal, Injecte, Preservative Fre 11/16/2015  . Pneumococcal Polysaccharide-23 11/13/2014  . Td 02/08/1996, 02/07/2006  . Tdap 04/27/2017  . Zoster Recombinat (Shingrix) 11/01/2018   [facility]anus: 2019 at [facility] Pneumovax: 2016 Prevnar 13: [place] Flu vaccine: 2020 Zostavax: 2020  Pap: 04/2016, q 6 months,  Dr. Quincy Simmonds- does not need to go back MGM: 02/2016 s/p masectomy DEXA: 12/2016 normal at OB/GYN - 0.7 NORMAL, will recheck 2-5 years due to medications Colonoscopy: Nov 2017 Dr. Watt Climes 10 years EGD: N/A DEE: 1 year ago Stress test 2008 normal Echo 2018  Patient Care Team: [name], MD as PCP - General (Internal Medicine) [name], MD as Consulting Physician (Dermatology) [name] [name], NP as Nurse Practitioner (Hematology and Oncology) Truitt Merle, MD as Consulting Physician (Hematology) [name], MD as Consulting Physician (General Surgery)   Medical History:  Past Medical History:  Diagnosis Date  . 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 Breast cancer, right breast ([place]) dx 03/08/2016---  oncolgoist-  [name]   Stage 1A (pT1, pN0, pM0)  Grade  2,  ER+, PR-, HER2 - ;  invasive ductal carcinoma s/p  bilateral mastectomies w/ sln bx's (negative) and completed chemotherapy (05-20-2016 to 09-29-2016)  . Eczema   . Family history of breast cancer   . Family history of genetic disease carrier    paternal relatives with a BRCA mutation  . Family history of ovarian cancer   . History of abnormal cervical Pap smear age 46s  . History of exercise intolerance 06/27/2006   normal  . HSV-2 infection   . Hypertension   . Seasonal and perennial allergic rhinitis   . Wears contact lenses    Allergies No Known Allergies  SURGICAL HISTORY She  has a past surgical history that includes Dilation and curettage of uterus (2008); Mastectomy w/ sentinel node biopsy (Bilateral, 04/12/2016);  Breast biopsy (Right, 04/13/2016); Colonoscopy; Portacath placement (Right, 05/19/2016); transthoracic echocardiogram (05/10/2016); and Laparoscopic salpingo oophorectomy (Bilateral, 01/09/2017). FAMILY HISTORY Her family history includes Breast cancer in her cousin; Heart attack in her father; Heart disease in her father; Hypertension in her father; Ovarian cancer in her cousin, cousin, paternal aunt, paternal aunt, and another family member; Polymyalgia rheumatica in her mother; Prostate cancer in her father. SOCIAL HISTORY She  reports that she quit smoking about 22 years ago. She has a 25.00 pack-year smoking history. She has never used smokeless tobacco. She reports current alcohol use of about 14.0 standard drinks of alcohol per week. She reports that she does not use drugs.  MEDICARE WELLNESS OBJECTIVES: Physical activity: Current Exercise Habits: Home exercise routine, Type of exercise: walking;stretching, Time (Minutes): 30, Frequency (Times/Week): 5, Weekly Exercise (Minutes/Week): 150 Cardiac risk factors: Cardiac Risk Factors include: advanced age (>65mn, >>41women);hypertension;dyslipidemia Depression/mood screen:   [facility]   Decreased Interest 0  Down, Depressed, Hopeless 0  PHQ - 2 Score 0    ADLs:  In your present state of health, do you have any difficulty performing the following activities: 12/17/2018  Hearing? N  Vision? N  Difficulty concentrating or making decisions? N  Walking or climbing stairs? N  Dressing or bathing? N  Doing errands, shopping? N  Preparing Food and eating ? N  Using the Toilet? N  In the past six months, have you accidently leaked urine? N  Do you have problems with loss of bowel control? N  Managing your Medications? N  Managing your Finances? N  Housekeeping or managing your Housekeeping? N  Some recent data might be hidden     Cognitive Testing  Alert? Yes  Normal Appearance?Yes  Oriented to person? Yes  Place? Yes   Time? Yes  Recall of three objects?  Yes  Can perform simple calculations? Yes  Displays appropriate judgment?Yes  Can read the correct time from a watch face?Yes  EOL planning: Does Patient Have a Medical Advance Directive?: No Would patient like information on creating a medical advance directive?: No - Patient declined(patient has all the paperwork at home)   Review of Systems  Constitutional: Negative.   HENT: Negative for congestion, ear discharge, ear pain, hearing loss, nosebleeds, sore throat and tinnitus.   Eyes: Negative.   Respiratory: Negative.  Negative for stridor.   Cardiovascular: Negative.   Gastrointestinal: Negative.   Genitourinary: Negative.   Musculoskeletal: Negative.   Skin: Negative for itching and rash.  Neurological: Negative.  Negative for headaches.  Endo/Heme/Allergies: Negative.   Psychiatric/Behavioral: Negative.      Physical Exam: Estimated body mass index is 23.08 kg/m as calculated from the following:   Height as of this encounter: 5' 6.5" (1.689 m).   Weight as of this encounter: 145 lb 3.2 oz (65.9 kg). BP 118/74   Temp (!) 97.2 F (36.2 C)   Ht 5' 6.5" (1.689 m)   Wt 145 lb 3.2 oz (65.9 kg)    LMP 02/07/1998 (Approximate)   BMI 23.08 kg/m  General Appearance: Well nourished, in no apparent distress. Eyes: PERRLA, EOMs, conjunctiva no swelling or erythema, normal fundi and vessels. Sinuses: No Frontal/maxillary tenderness ENT/Mouth: Ext aud canals clear, normal light reflex with TMs without erythema, bulging.  Good dentition. No erythema, swelling, or exudate on post pharynx. Tonsils not swollen or erythematous. Hearing normal.  Neck: Supple, thyroid normal. No bruits Respiratory: Respiratory effort normal, BS equal bilaterally without rales, rhonchi, wheezing or stridor.  Cardio: RRR without murmurs, rubs or gallops. Brisk peripheral pulses without edema.  Chest: symmetric, with normal excursions and percussion. Breasts: defer Abdomen: Soft, +BS. Non tender, no guarding, rebound, hernias, masses, or organomegaly. .  Lymphatics: Non tender without lymphadenopathy.  Genitourinary: defer Musculoskeletal: Full ROM all peripheral extremities,5/5 strength, and normal gait. Skin: + dry scaly patches bilateral hands. Warm, dry without lesions, ecchymosis.  Neuro: Cranial nerves intact, reflexes equal bilaterally. Normal muscle tone, no cerebellar symptoms. Sensation intact.  Psych: Awake and oriented X 3, normal affect, Insight and Judgment appropriate.    Medicare Attestation I have personally reviewed: The patient's medical and social history Their use of alcohol, tobacco or illicit drugs Their current medications and supplements The patient's functional ability including ADLs,fall risks, home safety risks, cognitive, and hearing and visual impairment Diet and physical activities Evidence for depression or mood disorders  The patient's weight, height, BMI, and visual acuity have been recorded in the chart.  I have made referrals, counseling, and provided education to the patient based on review of the above and I have provided the patient with a written personalized care plan for  preventive services.    [name] 10:21 AM

## 2018-12-17 ENCOUNTER — Other Ambulatory Visit: Payer: Self-pay

## 2018-12-17 ENCOUNTER — Ambulatory Visit (INDEPENDENT_AMBULATORY_CARE_PROVIDER_SITE_OTHER): Payer: Medicare Other | Admitting: Physician Assistant

## 2018-12-17 ENCOUNTER — Encounter: Payer: Self-pay | Admitting: Physician Assistant

## 2018-12-17 VITALS — BP 118/74 | Temp 97.2°F | Ht 66.5 in | Wt 145.2 lb

## 2018-12-17 DIAGNOSIS — C50111 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 female breast: Secondary | ICD-10-CM

## 2018-12-17 DIAGNOSIS — D6481 Anemia due to antineoplastic chemotherapy: Secondary | ICD-10-CM

## 2018-12-17 DIAGNOSIS — I10 Essential (primary) hypertension: Secondary | ICD-10-CM | POA: Diagnosis not present

## 2018-12-17 DIAGNOSIS — R6889 Other general symptoms and signs: Secondary | ICD-10-CM

## 2018-12-17 DIAGNOSIS — T7840XD Allergy, unspecified, subsequent encounter: Secondary | ICD-10-CM

## 2018-12-17 DIAGNOSIS — R232 Flushing: Secondary | ICD-10-CM

## 2018-12-17 DIAGNOSIS — Z0001 Encounter for general adult medical examination with abnormal findings: Secondary | ICD-10-CM | POA: Diagnosis not present

## 2018-12-17 DIAGNOSIS — Z136 Encounter for screening for cardiovascular disorders: Secondary | ICD-10-CM | POA: Diagnosis not present

## 2018-12-17 DIAGNOSIS — Z79899 Other long term (current) drug therapy: Secondary | ICD-10-CM

## 2018-12-17 DIAGNOSIS — Z170 Estrogen receptor positive status [ER+]: Secondary | ICD-10-CM

## 2018-12-17 DIAGNOSIS — J45909 Unspecified asthma, uncomplicated: Secondary | ICD-10-CM

## 2018-12-17 DIAGNOSIS — L309 Dermatitis, unspecified: Secondary | ICD-10-CM

## 2018-12-17 DIAGNOSIS — Z1501 Genetic susceptibility to malignant neoplasm of breast: Secondary | ICD-10-CM

## 2018-12-17 DIAGNOSIS — E559 Vitamin D deficiency, unspecified: Secondary | ICD-10-CM

## 2018-12-17 DIAGNOSIS — Z23 Encounter for immunization: Secondary | ICD-10-CM | POA: Diagnosis not present

## 2018-12-17 DIAGNOSIS — T451X5A Adverse effect of antineoplastic and immunosuppressive drugs, initial encounter: Secondary | ICD-10-CM

## 2018-12-17 DIAGNOSIS — Z1159 Encounter for screening for other viral diseases: Secondary | ICD-10-CM

## 2018-12-17 DIAGNOSIS — Z1509 Genetic susceptibility to other malignant neoplasm: Secondary | ICD-10-CM

## 2018-12-17 DIAGNOSIS — Z13220 Encounter for screening for lipoid disorders: Secondary | ICD-10-CM

## 2018-12-17 DIAGNOSIS — C50911 Malignant neoplasm of unspecified site of right female breast: Secondary | ICD-10-CM

## 2018-12-17 NOTE — Patient Instructions (Addendum)
Please be aware that some of the medications that you are on can sometimes cause a rare and potentially dangerous adverse reaction, called SEROTONIN SYNDROME: Symptoms of this condition include (but are not limited to):  Agitation or restlessness, confusion, rapid heart rate and high blood pressure, dilated pupils, loss of muscle coordination or twitching muscles, muscle rigidity/stiffness, sweating and/or flushing, diarrhea, headache, shivering, goose bumps. If you have any of these symptoms you may have to stop the medication. Call your health care provider immediately.  Severe serotonin syndrome can be life-threatening emergency. Signs and symptoms of a severe reaction may include: high fever, seizures, irregular heartbeat, unconsciousness or altered level of awareness or personality changes.  If you have any of these new symptoms, call 911 or have someone take you to the emergency room.    GENERAL HEALTH GOALS  Know what a healthy weight is for you (roughly BMI <25) and aim to maintain this  Aim for 7+ servings of fruits and vegetables daily  70-80+ fluid ounces of water or unsweet tea for healthy kidneys  Limit to max 1 drink of alcohol per day; avoid smoking/tobacco  Limit animal fats in diet for cholesterol and heart health - choose grass fed whenever available  Avoid highly processed foods, and foods high in saturated/trans fats  Aim for low stress - take time to unwind and care for your mental health  Aim for 150 min of moderate intensity exercise weekly for heart health, and weights twice weekly for bone health  Aim for 7-9 hours of sleep daily

## 2018-12-18 LAB — CBC WITH DIFFERENTIAL/PLATELET
Absolute Monocytes: 500 cells/uL (ref 200–950)
Basophils Absolute: 41 cells/uL (ref 0–200)
Basophils Relative: 0.9 %
Eosinophils Absolute: 72 cells/uL (ref 15–500)
Eosinophils Relative: 1.6 %
HCT: 40.5 % (ref 35.0–45.0)
Hemoglobin: 13.4 g/dL (ref 11.7–15.5)
Lymphs Abs: 1328 cells/uL (ref 850–3900)
MCH: 33.3 pg — ABNORMAL HIGH (ref 27.0–33.0)
MCHC: 33.1 g/dL (ref 32.0–36.0)
MCV: 100.5 fL — ABNORMAL HIGH (ref 80.0–100.0)
MPV: 10.7 fL (ref 7.5–12.5)
Monocytes Relative: 11.1 %
Neutro Abs: 2561 cells/uL (ref 1500–7800)
Neutrophils Relative %: 56.9 %
Platelets: 202 10*3/uL (ref 140–400)
RBC: 4.03 10*6/uL (ref 3.80–5.10)
RDW: 12.5 % (ref 11.0–15.0)
Total Lymphocyte: 29.5 %
WBC: 4.5 10*3/uL (ref 3.8–10.8)

## 2018-12-18 LAB — COMPLETE METABOLIC PANEL WITH GFR
AG Ratio: 2 (calc) (ref 1.0–2.5)
ALT: 14 U/L (ref 6–29)
AST: 19 U/L (ref 10–35)
Albumin: 4.7 g/dL (ref 3.6–5.1)
Alkaline phosphatase (APISO): 72 U/L (ref 37–153)
BUN: 17 mg/dL (ref 7–25)
CO2: 29 mmol/L (ref 20–32)
Calcium: 9.7 mg/dL (ref 8.6–10.4)
Chloride: 104 mmol/L (ref 98–110)
Creat: 0.83 mg/dL (ref 0.50–0.99)
GFR, Est African American: 86 mL/min/{1.73_m2} (ref >=60)
GFR, Est Non African American: 74 mL/min/{1.73_m2} (ref >=60)
Globulin: 2.4 g/dL (calc) (ref 1.9–3.7)
Glucose, Bld: 84 mg/dL (ref 65–99)
Potassium: 4.1 mmol/L (ref 3.5–5.3)
Sodium: 141 mmol/L (ref 135–146)
Total Bilirubin: 0.4 mg/dL (ref 0.2–1.2)
Total Protein: 7.1 g/dL (ref 6.1–8.1)

## 2018-12-18 LAB — IRON, TOTAL/TOTAL IRON BINDING CAP
%SAT: 41 % (calc) (ref 16–45)
Iron: 149 ug/dL (ref 45–160)
TIBC: 361 mcg/dL (calc) (ref 250–450)

## 2018-12-18 LAB — ABN TEST REFUSAL: ABN TEST REFUSED: 7600

## 2018-12-18 LAB — FERRITIN: Ferritin: 210 ng/mL (ref 16–288)

## 2018-12-18 LAB — HEPATITIS C ANTIBODY
Hepatitis C Ab: NONREACTIVE
SIGNAL TO CUT-OFF: 0.01 (ref <1.00)

## 2018-12-18 LAB — TSH: TSH: 1.54 mIU/L (ref 0.40–4.50)

## 2018-12-18 LAB — MAGNESIUM: Magnesium: 1.8 mg/dL (ref 1.5–2.5)

## 2018-12-18 LAB — VITAMIN D 25 HYDROXY (VIT D DEFICIENCY, FRACTURES): Vit D, 25-Hydroxy: 65 ng/mL (ref 30–100)

## 2018-12-24 DIAGNOSIS — I10 Essential (primary) hypertension: Secondary | ICD-10-CM

## 2018-12-24 MED ORDER — ESCITALOPRAM OXALATE 20 MG PO TABS: 20.0000 mg | ORAL_TABLET | Freq: Every day | ORAL | 3 refills | Status: DC

## 2018-12-24 MED ORDER — LOSARTAN POTASSIUM 100 MG PO TABS: ORAL_TABLET | ORAL | 1 refills | Status: DC

## 2019-01-14 ENCOUNTER — Ambulatory Visit: Payer: BLUE CROSS/BLUE SHIELD | Admitting: Obstetrics and Gynecology

## 2019-02-15 NOTE — Progress Notes (Signed)
[place]   [phone] [phone]   Clinic Follow up Note   Patient Care Team: [name], MD as PCP - General (Internal Medicine) [name], MD as Consulting Physician (Dermatology) [name], [name], NP as Nurse Practitioner (Hematology and Oncology) Truitt Merle, MD as Consulting Physician (Hematology) [name], MD as Consulting Physician (General Surgery)  Date of Service:  02/20/2019  CHIEF COMPLAINT: F/u of right breast cancer  SUMMARY OF ONCOLOGIC HISTORY: Oncology History Overview Note  Cancer Staging 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 breast in female, estrogen receptor positive ([place]) Staging form: Breast, AJCC 8th Edition - Clinical stage from 03/08/2016: Stage IA (cT1c, cN0, cM0, G2, ER: Positive, PR: Negative, HER2: Negative) - Signed by Truitt Merle, MD on 03/15/2016 - Pathologic stage from 04/12/2016: Stage IA (pT1c, pN0, cM0, G2, ER: Positive, PR: Negative, HER2: Negative, Oncotype DX score: 49) - Signed by Truitt Merle, MD on 05/05/2016     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 breast in female, estrogen receptor positive ([place])  03/04/2016 Mammogram   Diagnostic bilateral mammogram and right breast ultrasound showed a 1.7 cm mass in the 12:00 position of the right breast, highly suspicious for malignancy. There is a borderline abnormal right axillary lymph node, also suspicious for metastasis.    03/08/2016 Initial Diagnosis   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 breast in female, estrogen receptor positive ([place])   03/08/2016 Initial Biopsy   Right breast 12:00 core needle biopsy showed invasive ductal carcinoma, grade 2, right axillary lymph node biopsy was negative.   03/08/2016 Receptors her2   ER 20% positive, PR negative, HER-2 negative, Ki-67 20%   03/30/2016 Imaging   MR Bilateral Breast 03/30/2016 IMPRESSION: Known unifocal right breast malignancy. No MRI evidence of  malignancy in the left breast.   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  Genes Analyzed: 43 genes on [facility]'s Common Cancers panel (APC, ATM, AXIN2, BARD1, BMPR1A, BRCA1, BRCA2, BRIP1, CDH1, CDKN2A, CHEK2, DICER1, EPCAM, GREM1, HOXB13, KIT, MEN1, MLH1, MSH2, MSH6, MUTYH, NBN, NF1, PALB2, PDGFRA, PMS2, POLD1, POLE, PTEN, RAD50, RAD51C, RAD51D, SDHA, SDHB, SDHC, SDHD, SMAD4, SMARCA4, STK11, TP53, TSC1, TSC2, VHL).    04/12/2016 Surgery   Bilateral mastectomy and right sentinel lymph node biopsy, by [name]   04/12/2016 Pathology Results   Right breast mastectomy showed invasive grade 2 invasive ductal carcinoma, 1.7 cm, margins were negative, 3 sentinel lymph nodes were negative, left simple mastectomy fibroadenoma, one benign node, no atypia or tumor.    05/04/2016 Oncotype testing   Recurrence score 49, high-risk, predicts 10 year risk of distant recurrence 32% with tamoxifen alone.   05/10/2016 Echocardiogram   Echo on 05/10/16 showed EF 55-60%.   05/20/2016 - 09/29/2016 Chemotherapy   dose dense Adriamycin and Cytoxan, every 2 weeks starting 05/20/16 for 4 cycles, followed by Taxol weekly (Taxol Changed to Abaraxane 137m/m2 on 09/01/17 due to skin rash).    10/2016 -  Anti-estrogen oral therapy    Anastrozole started in 10/2016 and stopped after 2 weeks due to side effects. Switched to Letrozole 11/30/2016    12/27/2016 Imaging   Bone Density 12/27/16  ASSESSMENT: The BMD measured at Femur Neck Right is 0.938 g/cm2 with a T-score of -0.7. This patient is considered normal according to [facility] criteria. Site Region Measured Date Measured Age YA BMD Significant CHANGE T-score DualFemur Neck Right 12/27/2016    63.0         -  0.5    0.938 g/cm2 AP Spine  L1-L4      12/27/2016    61.0         0.0     1.200 g/cm2   12/27/2016 Imaging   12/27/2016 Bone Density ASSESSMENT: The BMD measured at Femur Neck Right is  0.938 g/cm2 with a T-score of -0.7.   01/05/2017 Survivorship   Survivorship Clinic with with Gardenia Phlegm, NP     01/09/2017 Surgery   LAPAROSCOPIC SALPINGO OOPHORECTOMY with pelvic washings by Dr. [name] and Dr. [name] 01/09/17  Diagnosis Ovaries and fallopian tubes, bilateral - BENIGN OVARIES AND FALLOPIAN TUBES WITH PARATUBAL CYST - NO MALIGNANCY IDENTIFIED    Breast cancer, right ([place])  04/12/2016 Initial Diagnosis   Breast cancer, right ([place])      CURRENT THERAPY:  Anastrozole started in 10/2016 and stopped after 2 weeks due to side effects. Switched to Letrozole 11/30/2016  INTERVAL HISTORY:  Chelsea Salinas is here for a follow up of right breast cancer. She presents to the clinic alone. She notes she is doing well. She has been taking letrozole and tolerating moderately well with hot flashes more at night. She wakes up at night intermittently. She takes Effexor 77m twice in the AM and once in the even. She is eventually able to fall asleep. She is able to function during the day. She still goes for walks and rides her house. She notes she takes 3052mGabapentin at night. She notes she takes multivitamin and other supplements such as calcium, Vit B, Vit D and Zinc.  She notes itching at right breast scar tissue site. She sees her PCP every 6 months.     REVIEW OF SYSTEMS:   Constitutional: Denies fevers, chills or abnormal weight loss (+) Hot flashes, mostly at night Eyes: Denies blurriness of vision Ears, nose, mouth, throat, and face: Denies mucositis or sore throat Respiratory: Denies cough, dyspnea or wheezes Cardiovascular: Denies palpitation, chest discomfort or lower extremity swelling Gastrointestinal:  Denies nausea, heartburn or change in bowel habits Skin: Denies abnormal skin rashes Lymphatics: Denies new lymphadenopathy or easy bruising Neurological:Denies numbness, tingling or new weaknesses Behavioral/Psych: Mood is stable, no new  changes  All other systems were reviewed with the patient and are negative.  MEDICAL HISTORY:  Past Medical History:  Diagnosis Date  . 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 [place], [place] [place]  oncolgoist-  dr feBurr Medico Stage 1A (pT1, pN0, pM0)  Grade 2,  ER+, PR-, HER2 - ;  invasive ductal carcinoma s/p  bilateral mastectomies w/ sln bx's (negative) and completed chemotherapy (05-20-2016 to 09-29-2016)  . Eczema   . Family history of breast cancer   . Family history of genetic disease carrier    paternal relatives with a BRCA mutation  . Family history of ovarian cancer   . History of abnormal cervical Pap smear age [age]s. History of exercise intolerance 06/27/2006   normal  . HSV-2 infection   . Hypertension   . Seasonal and perennial allergic rhinitis   . Wears contact lenses     SURGICAL HISTORY: Past Surgical History:  Procedure Laterality Date  . BREAST BIOPSY Right 04/13/2016   Procedure: Evacuation RIGHT Breast  Hematoma;  Surgeon: MaRolm BookbinderMD;  [place] [facility] [place]: [facility];  Laterality: Right;  . COLONOSCOPY    . DILATION AND CURETTAGE OF UTERUS  2008  . LAPAROSCOPIC  SALPINGO OOPHERECTOMY Bilateral 01/09/2017   Procedure: LAPAROSCOPIC SALPINGO OOPHORECTOMY with pelvic washings;  Surgeon: [name], MD;  Location: [facility];  Service: Gynecology;  Laterality: Bilateral;  . MASTECTOMY W/ SENTINEL NODE BIOPSY Bilateral 04/12/2016   Procedure: BILATERAL TOTAL MASTECTOMIES  WITH RIGHT SENTINEL LYMPH NODE BIOPSY;  Surgeon: [name], MD;  Location: [place];  Service: General;  Laterality: Bilateral;  . PORTACATH PLACEMENT Right 05/19/2016   Procedure: INSERTION PORT-A-CATH WITH [place];  Surgeon: [name], MD;  Location: [place];  Service: General;  Laterality: Right;  . TRANSTHORACIC ECHOCARDIOGRAM  05/10/2016   ef 84-16%, grade 1 diastolic dyfuntion/  trivial TR     I have reviewed the social history and family history with the patient and they are unchanged from previous note.  ALLERGIES:  has No Known Allergies.  MEDICATIONS:  Current Outpatient Medications  Medication Sig Dispense Refill  . acyclovir ointment (ZOVIRAX) 5 % Apply 1 application topically every 3 (three) hours. 15 g 1  . aspirin EC 81 MG tablet Take 81 mg by mouth daily.    . Calcium-Magnesium-Vitamin D (CALCIUM 1200+D3 PO) Take 1 tablet by mouth every evening.    . cetirizine (ZYRTEC) 10 MG tablet Take 10 mg by mouth at bedtime.     . cloNIDine (CATAPRES) 0.2 MG tablet Take 1 tablet at Bedtime for BP 90 tablet 3  . escitalopram (LEXAPRO) 20 MG tablet Take 1 tablet (20 mg total) by mouth daily. 90 tablet 3  . fluticasone (FLONASE ALLERGY RELIEF) 50 MCG/ACT nasal spray Place 1 spray into both nostrils every morning.     . gabapentin (NEURONTIN) 300 MG capsule TAKE 1 CAPSULE(300 MG) BY MOUTH AT BEDTIME 90 capsule 1  . letrozole (FEMARA) 2.5 MG tablet TAKE 1 TABLET(2.5 MG) BY MOUTH EVERY MORNING 90 tablet 3  . losartan (COZAAR) 100 MG tablet Take 1 tablet Daily for BP 90 tablet 1  . montelukast (SINGULAIR) 10 MG tablet Take 1 tablet Daily for Allergies 90 tablet 3  . triamcinolone cream (KENALOG) 0.1 % Apply 1 application topically 2 (two) times daily as needed (eczema). 453.6 g 1  . valACYclovir (VALTREX) 500 MG tablet Take 1 tablet (500 mg total) by mouth 2 (two) times daily. Take for 3 days as needed. 30 tablet 1  . venlafaxine XR (EFFEXOR-XR) 75 MG 24 hr capsule TAKE 2 CAPSULE BY MOUTH EVERY MORNING AND TAKE 1 CAPSULE BY MOUTH EVERY EVENING 270 capsule 1   No current facility-administered medications for this visit.    PHYSICAL EXAMINATION: ECOG PERFORMANCE STATUS: 0 - Asymptomatic  Vitals:   02/20/19 1320  BP: (!) 155/89  Pulse: 77  Resp: 17  Temp: 97.8 F (36.6 C)  SpO2: 100%   Filed Weights   02/20/19 1320  Weight: 146 lb 9.6 oz (66.5 kg)    GENERAL:alert, no  distress and comfortable SKIN: skin color, texture, turgor are normal, no rashes or significant lesions EYES: normal, Conjunctiva are pink and non-injected, sclera clear  NECK: supple, thyroid normal size, non-tender, without nodularity LYMPH:  no palpable lymphadenopathy in the cervical, axillary  LUNGS: clear to auscultation and percussion with normal breathing effort HEART: regular rate & rhythm and no murmurs and no lower extremity edema ABDOMEN:abdomen soft, non-tender and normal bowel sounds Musculoskeletal:no cyanosis of digits and no clubbing  NEURO: alert & oriented x 3 with fluent speech, no focal motor/sensory deficits BREAST: s/p b/l mastectomy: Surgical incisions healed well. No palpable mass, nodules or adenopathy bilaterally.  Breast exam benign.   LABORATORY DATA:  I have reviewed the data as listed CBC Latest Ref Rng & Units 02/20/2019 12/17/2018 08/20/2018  WBC 4.0 - 10.5 K/uL 5.0 4.5 5.1  Hemoglobin 12.0 - 15.0 g/dL 13.0 13.4 13.4  Hematocrit 36.0 - 46.0 % 38.8 40.5 40.0  Platelets 150 - 400 K/uL 221 202 210     CMP Latest Ref Rng & Units 02/20/2019 12/17/2018 08/20/2018  Glucose 70 - 99 mg/dL 95 84 103(H)  BUN 8 - 23 mg/dL _0 Creatinine 0.44 - 1.00 mg/dL 0.82 0.83 0.74  Sodium 135 - 145 mmol/L 140 141 142  Potassium 3.5 - 5.1 mmol/L 4.3 4.1 4.0  Chloride 98 - 111 mmol/L 102 104 104  CO2 22 - 32 mmol/L _1 Calcium 8.9 - 10.3 mg/dL 9.0 9.7 9.3  Total Protein 6.5 - 8.1 g/dL 7.0 7.1 7.4  Total Bilirubin 0.3 - 1.2 mg/dL 0.3 0.4 0.5  Alkaline Phos 38 - 126 U/L 91 - 93  AST 15 - 41 U/L _2 ALT 0 - 44 U/L _3 RADIOGRAPHIC STUDIES: I have personally reviewed the radiological images as listed and agreed with the findings in the report. No results found.   ASSESSMENT & PLAN:  Chelsea Salinas is a 64 y.o. female with    1. 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 Oncotype RS 49, high risk -She was diagnosed in 02/2016. She is s/p b/l mastectomy and BSO due to her BRCA1+ mutation. She completed adjuvant chemo AC-T.  -She started antiestrogen withAnastrozole in 10/2016 and stopped after 2 weeks due to side effects. Switched to Letrozole 11/30/2016. She tolerated letrozole well with manageable hot flashes and joint pain. Plan for at least 5-7 years.  -She is clinically doing well. Lab reviewed, her CBC and CMP are within normal limits except MCV 100.3. Her physical exam was unremarkable. There is no clinical concern for recurrence.  -She is 2 years out of her initial diagnosis, risk of recurrence decreases overtime given her weakly ER positive disease  -Given she has had a b/l mastectomy there isno need for mammograms.  -Continue letrozole for 5 years  -I encouraged her to proceed with vaccine when it becomes available to her. She is agreeable.  -Phone visit in 6 months and OV in 1 year. I encouraged her to contact clinic with any concerns.    2. Hot flashes, secondary to letrozole, insomnia  -mainly at night lately and has interrupted her sleep  -She is currently on effexor 70m twice in the Am and once in the Pm. She also has Gabapentin 3072mat night. I encouraged her to increase to 2 tabs at night. -Meds managed by her PCP, She is also on Lexapro.  -I reviewed sleep hygiene with her  3. BRCA1 mutation (+) -We previously discussed the high risk of breast cancer and ovarian cancer due to the BRCA1 mutation. She is status post bilateral mastectomyand BSO. -We previously discussed slightly increased risk of pancreatic cancer, however she does not have any family history of pancreatic cancer, does not meet the criteria for routine pancreatic cancer screening. -Her sister has done genetic testing, results were negative.She has no children.  -I also encouraged her to f/u with her Gyn, she can continue Pap Smear    4.  Bone Health -Her 12/2016 DEXA  was normal. She is overdue, will order to be done in the next few months  -Continue VitD and calcium daily    Plan:  -She is clinically doing well, with moderate hot flushes. She will restart Yoga   -Continue letrozole  -DEXA in the next few month  -Phone visit in 6 months -Lab and F/u in 1 year    No problem-specific Assessment & Plan notes found for this encounter.   Orders Placed This Encounter  Procedures  . DG Bone Density    Standing Status:   Future    Standing Expiration Date:   02/20/2020    Order Specific Question:   Reason for Exam (SYMPTOM  OR DIAGNOSIS REQUIRED)    Answer:   screening    Order Specific Question:   Preferred imaging location?    Answer:   [facility]    Order Specific Question:   Release to patient    Answer:   Immediate   All questions were answered. The patient knows to call the clinic with any problems, questions or concerns. No barriers to learning was detected. The total time spent in the appointment was 20 minutes.     Truitt Merle, MD 02/20/2019   I, Joslyn Devon, am acting as scribe for Truitt Merle, MD.   I have reviewed the above documentation for accuracy and completeness, and I agree with the above.

## 2019-02-18 ENCOUNTER — Telehealth: Payer: Self-pay | Admitting: Hematology

## 2019-02-18 NOTE — Telephone Encounter (Signed)
Called patient per providers request, lefty a voicemail.

## 2019-02-20 ENCOUNTER — Other Ambulatory Visit: Payer: Self-pay

## 2019-02-20 ENCOUNTER — Inpatient Hospital Stay (HOSPITAL_BASED_OUTPATIENT_CLINIC_OR_DEPARTMENT_OTHER): Payer: Medicare Other | Admitting: Hematology

## 2019-02-20 ENCOUNTER — Telehealth: Payer: Self-pay | Admitting: Hematology

## 2019-02-20 ENCOUNTER — Encounter: Payer: Self-pay | Admitting: Hematology

## 2019-02-20 ENCOUNTER — Inpatient Hospital Stay: Payer: Medicare Other | Attending: Hematology

## 2019-02-20 VITALS — BP 155/89 | HR 77 | Temp 97.8°F | Resp 17 | Ht 66.5 in | Wt 146.6 lb

## 2019-02-20 DIAGNOSIS — Z1509 Genetic susceptibility to other malignant neoplasm: Secondary | ICD-10-CM

## 2019-02-20 DIAGNOSIS — I10 Essential (primary) hypertension: Secondary | ICD-10-CM

## 2019-02-20 DIAGNOSIS — C50111 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 female breast: Secondary | ICD-10-CM | POA: Insufficient documentation

## 2019-02-20 DIAGNOSIS — G4700 Insomnia, unspecified: Secondary | ICD-10-CM | POA: Diagnosis not present

## 2019-02-20 DIAGNOSIS — Z9013 Acquired absence of bilateral breasts and nipples: Secondary | ICD-10-CM | POA: Diagnosis not present

## 2019-02-20 DIAGNOSIS — Z170 Estrogen receptor positive status [ER+]: Secondary | ICD-10-CM

## 2019-02-20 DIAGNOSIS — Z1501 Genetic susceptibility to malignant neoplasm of breast: Secondary | ICD-10-CM | POA: Diagnosis not present

## 2019-02-20 DIAGNOSIS — E2839 Other primary ovarian failure: Secondary | ICD-10-CM | POA: Diagnosis not present

## 2019-02-20 DIAGNOSIS — Z79811 Long term (current) use of aromatase inhibitors: Secondary | ICD-10-CM | POA: Insufficient documentation

## 2019-02-20 DIAGNOSIS — N951 Menopausal and female climacteric states: Secondary | ICD-10-CM | POA: Insufficient documentation

## 2019-02-20 LAB — CBC WITH DIFFERENTIAL/PLATELET
Abs Immature Granulocytes: 0.02 10*3/uL (ref 0.00–0.07)
Basophils Absolute: 0 10*3/uL (ref 0.0–0.1)
Basophils Relative: 1 %
Eosinophils Absolute: 0.1 10*3/uL (ref 0.0–0.5)
Eosinophils Relative: 1 %
HCT: 38.8 % (ref 36.0–46.0)
Hemoglobin: 13 g/dL (ref 12.0–15.0)
Immature Granulocytes: 0 %
Lymphocytes Relative: 33 %
Lymphs Abs: 1.6 10*3/uL (ref 0.7–4.0)
MCH: 33.6 pg (ref 26.0–34.0)
MCHC: 33.5 g/dL (ref 30.0–36.0)
MCV: 100.3 fL — ABNORMAL HIGH (ref 80.0–100.0)
Monocytes Absolute: 0.6 10*3/uL (ref 0.1–1.0)
Monocytes Relative: 11 %
Neutro Abs: 2.7 10*3/uL (ref 1.7–7.7)
Neutrophils Relative %: 54 %
Platelets: 221 10*3/uL (ref 150–400)
RBC: 3.87 MIL/uL (ref 3.87–5.11)
RDW: 12.6 % (ref 11.5–15.5)
WBC: 5 10*3/uL (ref 4.0–10.5)
nRBC: 0 % (ref 0.0–0.2)

## 2019-02-20 LAB — COMPREHENSIVE METABOLIC PANEL
ALT: 16 U/L (ref 0–44)
AST: 21 U/L (ref 15–41)
Albumin: 4.2 g/dL (ref 3.5–5.0)
Alkaline Phosphatase: 91 U/L (ref 38–126)
Anion gap: 11 (ref 5–15)
BUN: 18 mg/dL (ref 8–23)
CO2: 27 mmol/L (ref 22–32)
Calcium: 9 mg/dL (ref 8.9–10.3)
Chloride: 102 mmol/L (ref 98–111)
Creatinine, Ser: 0.82 mg/dL (ref 0.44–1.00)
GFR calc Af Amer: >60 mL/min (ref >60)
GFR calc non Af Amer: >60 mL/min (ref >60)
Glucose, Bld: 95 mg/dL (ref 70–99)
Potassium: 4.3 mmol/L (ref 3.5–5.1)
Sodium: 140 mmol/L (ref 135–145)
Total Bilirubin: 0.3 mg/dL (ref 0.3–1.2)
Total Protein: 7 g/dL (ref 6.5–8.1)

## 2019-02-20 MED ORDER — LETROZOLE 2.5 MG PO TABS: ORAL_TABLET | ORAL | 3 refills | Status: DC

## 2019-02-20 NOTE — Telephone Encounter (Signed)
Scheduled appt per 1/13 los.  Sent a message to HIM pool to get a calendar mailed out. 

## 2019-02-27 ENCOUNTER — Other Ambulatory Visit: Payer: Self-pay

## 2019-02-27 ENCOUNTER — Encounter: Payer: Self-pay | Admitting: Hematology

## 2019-02-27 DIAGNOSIS — C50111 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 female breast: Secondary | ICD-10-CM

## 2019-02-27 DIAGNOSIS — Z170 Estrogen receptor positive status [ER+]: Secondary | ICD-10-CM

## 2019-02-27 MED ORDER — GABAPENTIN 300 MG PO CAPS: ORAL_CAPSULE | ORAL | 1 refills | Status: DC

## 2019-04-03 MED ORDER — CLONIDINE HCL 0.1 MG PO TABS
0.10 | ORAL_TABLET | ORAL | Status: DC
Start: 2019-04-04 — End: 2019-04-03

## 2019-04-03 MED ORDER — LOSARTAN POTASSIUM 25 MG PO TABS
25.00 | ORAL_TABLET | ORAL | Status: DC
Start: 2019-04-04 — End: 2019-04-03

## 2019-05-18 ENCOUNTER — Other Ambulatory Visit: Payer: Self-pay | Admitting: Adult Health Nurse Practitioner

## 2019-05-20 ENCOUNTER — Other Ambulatory Visit: Payer: Self-pay

## 2019-05-20 ENCOUNTER — Ambulatory Visit
Admission: RE | Admit: 2019-05-20 | Discharge: 2019-05-20 | Disposition: A | Discharge status: Home / Self Care | Payer: Medicare Other | Source: Ambulatory Visit | Attending: Hematology | Admitting: Hematology

## 2019-05-20 DIAGNOSIS — E2839 Other primary ovarian failure: Secondary | ICD-10-CM

## 2019-05-21 ENCOUNTER — Encounter: Payer: Self-pay | Admitting: Hematology

## 2019-06-26 NOTE — Progress Notes (Signed)
CPE Assessment and Plan:  Essential hypertension - continue medications, DASH diet, exercise and monitor at home. Call if greater than 130/80.  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 breast in female, estrogen receptor positive ([place]) Continue follow up  BRCA1 positive Continue follow up  Vitamin D deficiency -     VITAMIN D 25 Hydroxy (Vit-D Deficiency, Fractures)  Allergic state, subsequent encounter -     montelukast (SINGULAIR) 10 MG tablet; TAKE 1 TABLET(10 MG) BY MOUTH DAILY  Uncomplicated asthma, unspecified asthma severity, unspecified whether persistent -     montelukast (SINGULAIR) 10 MG tablet; TAKE 1 TABLET(10 MG) BY MOUTH DAILY  Eczema, unspecified type -    Refilled triamcinolone cream  Screening cholesterol level -     Lipid panel  Medication management -     Magnesium  CPE 1 year  Hot flashes -  Continue medications   Discussed med's effects and SE's. Screening labs and tests as requested with regular follow-up as recommended. Future Appointments  Date Time Provider [place]  08/19/2019 10:40 AM Truitt Merle, MD [facility] None  12/30/2019 11:00 AM [name], PA-C [facility] None  06/29/2020 10:00 AM [name], PA-C [facility] None    HPI 64 y.o. female  presents for CPE  Her blood pressure has been controlled at home, today their BP is BP: 120/80 She does workout, she is walking, she is riding horses, volunteering. She is officially retired.   She denies chest pain, shortness of breath, dizziness.   She was diagnosed with right breast cancer ER +, PR+ HER2 - 02/2016, following with Dr. Burr Medico, she is on letrozole, had double mastectomy with [name] 04/12/2016, BRCA 1+, had BSP dec 2018. Normal DEXA  05/21/2019.  She is on lexapro, effexor and catapress for hot flashes that help.  BMI is Body mass index is 22.89 kg/m., she is working on diet and exercise. Wt Readings from Last 3 Encounters:  06/27/19 144 lb (65.3 kg)  02/20/19 146 lb 9.6 oz (66.5 kg)  12/17/18 145 lb 3.2 oz (65.9 kg)   She is not on cholesterol medication and denies myalgias. Her cholesterol is at goal. The cholesterol last visit was:   Lab Results  Component Value Date   CHOL 235 (H) 05/29/2018   HDL 100 05/29/2018   LDLCALC 105 (H) 05/29/2018   TRIG 183 (H) 05/29/2018   CHOLHDL 2.4 05/29/2018   Last A1C in the office was:  Lab Results  Component Value Date   HGBA1C 5.2 05/29/2018   Patient is on Vitamin D supplement, one a day, unsure of dosage.  Lab Results  Component Value Date   VD25OH 65 12/17/2018   She follows with Dr. Quincy Simmonds.  She has atopy with eczema, allergies, and asthma. She is on flonase for allergies, needs refill albuterol, has not been using it.    Current Medications:    Current Outpatient Medications (Cardiovascular):  .  cloNIDine (CATAPRES) 0.2 MG tablet, Take 1 tablet at Bedtime for BP .  losartan (COZAAR) 100 MG tablet, Take 1 tablet Daily for BP  Current Outpatient Medications (Respiratory):  .  cetirizine (ZYRTEC) 10 MG tablet, Take 10 mg by mouth at bedtime.  Marland Kitchen  fluticasone (FLONASE ALLERGY RELIEF) 50 MCG/ACT nasal spray, Place 1 spray into both nostrils every morning.  .  montelukast (SINGULAIR) 10 MG tablet, Take 1 tablet Daily for Allergies  Current Outpatient Medications (Analgesics):  .  aspirin EC 81 MG tablet, Take 81 mg by mouth daily.   Current Outpatient Medications (Other):  .  acyclovir ointment (ZOVIRAX) 5 %, Apply 1 application topically every 3 (three) hours. .  Calcium-Magnesium-Vitamin D (CALCIUM 1200+D3 PO), Take 1 tablet by mouth every evening. .  escitalopram (LEXAPRO) 20 MG tablet, Take 1 tablet (20 mg total) by mouth daily. Marland Kitchen  gabapentin  (NEURONTIN) 300 MG capsule, TAKE 1 CAPSULE(300 MG) BY MOUTH AT BEDTIME .  letrozole (FEMARA) 2.5 MG tablet, TAKE 1 TABLET(2.5 MG) BY MOUTH EVERY MORNING .  triamcinolone cream (KENALOG) 0.1 %, Apply 1 application topically 2 (two) times daily as needed (eczema). .  valACYclovir (VALTREX) 500 MG tablet, Take 1 tablet (500 mg total) by mouth 2 (two) times daily. Take for 3 days as needed. .  venlafaxine XR (EFFEXOR-XR) 75 MG 24 hr capsule, Take 2 capsules every Morning & 1 capsule at Night for Mood  Health Maintenance:   Immunization History  Administered Date(s) Administered  . Influenza Inj Mdck Quad Pf 11/01/2018  . Influenza Inj Mdck Quad With Preservative 11/22/2016, 11/23/2017  . Influenza, Seasonal, Injecte, Preservative Fre 11/16/2015  . Pneumococcal Conjugate-13 12/17/2018  . Pneumococcal Polysaccharide-23 11/13/2014  . Td 02/08/1996, 02/07/2006  . Tdap 04/27/2017  . Zoster Recombinat (Shingrix) 11/01/2018   Tetanus: 2019 at [facility] Pneumovax: 2016 Prevnar 13: 2020 Flu vaccine: 2020 shingrix: 2020 but never got 2nd one due to COVID vaccines- will check COVID: completed will message dates   Pap: 04/2016 Dr. Quincy Simmonds- does not need to go back MGM: 02/2016 s/p masectomy DEXA: 05/2019 NORMAL Colonoscopy: Nov 2017 Dr. Watt Climes 10 years EGD: N/A DEE: 1 year ago Stress test 2008 normal Echo 2018  Patient Care Team: [name], MD as PCP - General (Internal Medicine) [name], MD as Consulting Physician (Dermatology) [name], [name], NP as Nurse Practitioner (Hematology and Oncology) Truitt Merle, MD as Consulting Physician (Hematology) [name], MD as Consulting Physician (General Surgery)   Medical History:  Past Medical History:  Diagnosis Date  . 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 Breast cancer, right breast ([name]) dx 03/08/2016---  oncolgoist-  [name]   Stage 1A (pT1, pN0, pM0)  Grade 2,  ER+, PR-, HER2 - ;   invasive ductal carcinoma s/p  bilateral mastectomies w/ sln bx's (negative) and completed chemotherapy (05-20-2016 to 09-29-2016)  . Eczema   . Family history of breast cancer   . Family history of genetic disease carrier    paternal relatives with a BRCA mutation  . Family history of ovarian cancer   . History of abnormal cervical Pap smear age 24s  . History of exercise intolerance 06/27/2006   normal  . HSV-2 infection   . Hypertension   . Seasonal and perennial allergic rhinitis   . Wears contact lenses    Allergies No Known Allergies  SURGICAL HISTORY She  has a past surgical history that includes Dilation and curettage of uterus (2008); Mastectomy w/ sentinel node biopsy (Bilateral, 04/12/2016); Breast biopsy (Right, 04/13/2016); Colonoscopy; Portacath placement (Right, 05/19/2016); transthoracic echocardiogram (05/10/2016); and Laparoscopic salpingo oophorectomy (Bilateral, 01/09/2017). FAMILY HISTORY Her family history includes Breast cancer in her cousin; Heart attack in her father; Heart disease in her father; Hypertension in her  father; Ovarian cancer in her cousin, cousin, paternal aunt, paternal aunt, and another family member; Polymyalgia rheumatica in her mother; Prostate cancer in her father. SOCIAL HISTORY She  reports that she quit smoking about 23 years ago. She has a 25.00 pack-year smoking history. She has never used smokeless tobacco. She reports current alcohol use of about 14.0 standard drinks of alcohol per week. She reports that she does not use drugs.   Review of Systems  Constitutional: Negative.   HENT: Negative for congestion, ear discharge, ear pain, hearing loss, nosebleeds, sore throat and tinnitus.   Eyes: Negative.   Respiratory: Negative.  Negative for stridor.   Cardiovascular: Negative.   Gastrointestinal: Negative.   Genitourinary: Negative.   Musculoskeletal: Negative.   Skin: Negative for itching and rash.  Neurological: Negative.  Negative for  headaches.  Endo/Heme/Allergies: Negative.   Psychiatric/Behavioral: Negative.      Physical Exam: Estimated body mass index is 22.89 kg/m as calculated from the following:   Height as of this encounter: 5' 6.5" (1.689 m).   Weight as of this encounter: 144 lb (65.3 kg). BP 120/80   Pulse 66   Temp 97.6 F (36.4 C)   Ht 5' 6.5" (1.689 m)   Wt 144 lb (65.3 kg)   LMP 02/07/1998 (Approximate)   SpO2 95%   BMI 22.89 kg/m  General Appearance: Well nourished, in no apparent distress. Eyes: PERRLA, EOMs, conjunctiva no swelling or erythema, normal fundi and vessels. Sinuses: No Frontal/maxillary tenderness ENT/Mouth: Ext aud canals clear, normal light reflex with TMs without erythema, bulging.  Good dentition. No erythema, swelling, or exudate on post pharynx. Tonsils not swollen or erythematous. Hearing normal.  Neck: Supple, thyroid normal. No bruits Respiratory: Respiratory effort normal, BS equal bilaterally without rales, rhonchi, wheezing or stridor. Cardio: RRR without murmurs, rubs or gallops. Brisk peripheral pulses without edema.  Chest: symmetric, with normal excursions and percussion. Breasts: defer Abdomen: Soft, +BS. Non tender, no guarding, rebound, hernias, masses, or organomegaly. .  Lymphatics: Non tender without lymphadenopathy.  Genitourinary: defer Musculoskeletal: Full ROM all peripheral extremities,5/5 strength, and normal gait. Skin: + dry scaly patches bilateral hands. Warm, dry without lesions, ecchymosis.  Neuro: Cranial nerves intact, reflexes equal bilaterally. Normal muscle tone, no cerebellar symptoms. Sensation intact.  Psych: Awake and oriented X 3, normal affect, Insight and Judgment appropriate.   [name] 11:20 AM

## 2019-06-27 ENCOUNTER — Ambulatory Visit (INDEPENDENT_AMBULATORY_CARE_PROVIDER_SITE_OTHER): Payer: Medicare Other | Admitting: Physician Assistant

## 2019-06-27 ENCOUNTER — Other Ambulatory Visit: Payer: Self-pay

## 2019-06-27 ENCOUNTER — Encounter: Payer: Self-pay | Admitting: Physician Assistant

## 2019-06-27 VITALS — BP 120/80 | HR 66 | Temp 97.6°F | Ht 66.5 in | Wt 144.0 lb

## 2019-06-27 DIAGNOSIS — C50911 Malignant neoplasm of unspecified site of right female breast: Secondary | ICD-10-CM

## 2019-06-27 DIAGNOSIS — Z95828 Presence of other vascular implants and grafts: Secondary | ICD-10-CM

## 2019-06-27 DIAGNOSIS — E559 Vitamin D deficiency, unspecified: Secondary | ICD-10-CM

## 2019-06-27 DIAGNOSIS — J45909 Unspecified asthma, uncomplicated: Secondary | ICD-10-CM

## 2019-06-27 DIAGNOSIS — D6481 Anemia due to antineoplastic chemotherapy: Secondary | ICD-10-CM | POA: Diagnosis not present

## 2019-06-27 DIAGNOSIS — Z13220 Encounter for screening for lipoid disorders: Secondary | ICD-10-CM

## 2019-06-27 DIAGNOSIS — L309 Dermatitis, unspecified: Secondary | ICD-10-CM

## 2019-06-27 DIAGNOSIS — T7840XD Allergy, unspecified, subsequent encounter: Secondary | ICD-10-CM

## 2019-06-27 DIAGNOSIS — I10 Essential (primary) hypertension: Secondary | ICD-10-CM

## 2019-06-27 DIAGNOSIS — Z79899 Other long term (current) drug therapy: Secondary | ICD-10-CM

## 2019-06-27 DIAGNOSIS — C50111 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 female breast: Secondary | ICD-10-CM

## 2019-06-27 DIAGNOSIS — Z1501 Genetic susceptibility to malignant neoplasm of breast: Secondary | ICD-10-CM

## 2019-06-27 DIAGNOSIS — Z136 Encounter for screening for cardiovascular disorders: Secondary | ICD-10-CM

## 2019-06-27 DIAGNOSIS — Z0001 Encounter for general adult medical examination with abnormal findings: Secondary | ICD-10-CM

## 2019-06-27 DIAGNOSIS — Z170 Estrogen receptor positive status [ER+]: Secondary | ICD-10-CM

## 2019-06-27 NOTE — Patient Instructions (Signed)
Know what a healthy weight is for you (roughly BMI <25) and aim to maintain this  Aim for 7+ servings of fruits and vegetables daily  65-80+ fluid ounces of water or unsweet tea for healthy kidneys  Limit to max 1 drink of alcohol per day; avoid smoking/tobacco  Limit animal fats in diet for cholesterol and heart health - choose grass fed whenever available  Avoid highly processed foods, and foods high in saturated/trans fats  Aim for low stress - take time to unwind and care for your mental health  Aim for 150 min of moderate intensity exercise weekly for heart health, and weights twice weekly for bone health  Aim for 7-9 hours of sleep daily    

## 2019-06-28 ENCOUNTER — Other Ambulatory Visit: Payer: Self-pay | Admitting: Physician Assistant

## 2019-06-28 DIAGNOSIS — I10 Essential (primary) hypertension: Secondary | ICD-10-CM

## 2019-06-28 LAB — URINALYSIS, ROUTINE W REFLEX MICROSCOPIC
Bacteria, UA: NONE SEEN /HPF
Bilirubin Urine: NEGATIVE
Glucose, UA: NEGATIVE
Hyaline Cast: NONE SEEN /LPF
Ketones, ur: NEGATIVE
Leukocytes,Ua: NEGATIVE
Nitrite: NEGATIVE
Protein, ur: NEGATIVE
RBC / HPF: NONE SEEN /HPF (ref 0–2)
Specific Gravity, Urine: 1.019 (ref 1.001–1.03)
Squamous Epithelial / HPF: NONE SEEN /HPF (ref <=5)
WBC, UA: NONE SEEN /HPF (ref 0–5)
pH: 6 (ref 5.0–8.0)

## 2019-06-28 LAB — CBC WITH DIFFERENTIAL/PLATELET
Absolute Monocytes: 513 cells/uL (ref 200–950)
Basophils Absolute: 49 cells/uL (ref 0–200)
Basophils Relative: 0.9 %
Eosinophils Absolute: 81 cells/uL (ref 15–500)
Eosinophils Relative: 1.5 %
HCT: 43.5 % (ref 35.0–45.0)
Hemoglobin: 14.3 g/dL (ref 11.7–15.5)
Lymphs Abs: 1766 cells/uL (ref 850–3900)
MCH: 32.6 pg (ref 27.0–33.0)
MCHC: 32.9 g/dL (ref 32.0–36.0)
MCV: 99.1 fL (ref 80.0–100.0)
MPV: 10.6 fL (ref 7.5–12.5)
Monocytes Relative: 9.5 %
Neutro Abs: 2992 cells/uL (ref 1500–7800)
Neutrophils Relative %: 55.4 %
Platelets: 229 10*3/uL (ref 140–400)
RBC: 4.39 10*6/uL (ref 3.80–5.10)
RDW: 12.8 % (ref 11.0–15.0)
Total Lymphocyte: 32.7 %
WBC: 5.4 10*3/uL (ref 3.8–10.8)

## 2019-06-28 LAB — COMPLETE METABOLIC PANEL WITH GFR
AG Ratio: 2 (calc) (ref 1.0–2.5)
ALT: 20 U/L (ref 6–29)
AST: 20 U/L (ref 10–35)
Albumin: 4.7 g/dL (ref 3.6–5.1)
Alkaline phosphatase (APISO): 95 U/L (ref 37–153)
BUN: 18 mg/dL (ref 7–25)
CO2: 28 mmol/L (ref 20–32)
Calcium: 10.1 mg/dL (ref 8.6–10.4)
Chloride: 103 mmol/L (ref 98–110)
Creat: 0.58 mg/dL (ref 0.50–0.99)
GFR, Est African American: 112 mL/min/{1.73_m2} (ref >=60)
GFR, Est Non African American: 97 mL/min/{1.73_m2} (ref >=60)
Globulin: 2.3 g/dL (calc) (ref 1.9–3.7)
Glucose, Bld: 83 mg/dL (ref 65–99)
Potassium: 4.7 mmol/L (ref 3.5–5.3)
Sodium: 142 mmol/L (ref 135–146)
Total Bilirubin: 0.6 mg/dL (ref 0.2–1.2)
Total Protein: 7 g/dL (ref 6.1–8.1)

## 2019-06-28 LAB — LIPID PANEL
Cholesterol: 242 mg/dL — ABNORMAL HIGH (ref <200)
HDL: 90 mg/dL (ref >=50)
LDL Cholesterol (Calc): 131 mg/dL (calc) — ABNORMAL HIGH
Non-HDL Cholesterol (Calc): 152 mg/dL (calc) — ABNORMAL HIGH (ref <130)
Total CHOL/HDL Ratio: 2.7 (calc) (ref <5.0)
Triglycerides: 103 mg/dL (ref <150)

## 2019-06-28 LAB — MICROALBUMIN / CREATININE URINE RATIO
Creatinine, Urine: 72 mg/dL (ref 20–275)
Microalb Creat Ratio: 17 mcg/mg creat (ref <30)
Microalb, Ur: 1.2 mg/dL

## 2019-06-28 LAB — TSH: TSH: 1.22 mIU/L (ref 0.40–4.50)

## 2019-06-28 LAB — VITAMIN D 25 HYDROXY (VIT D DEFICIENCY, FRACTURES): Vit D, 25-Hydroxy: 45 ng/mL (ref 30–100)

## 2019-06-28 LAB — MAGNESIUM: Magnesium: 1.9 mg/dL (ref 1.5–2.5)

## 2019-08-14 NOTE — Progress Notes (Signed)
[facility]   Telephone:(336) [phone] Fax:(336) [phone]   Clinic Follow up Note   Patient Care Team: Lucky Cowboy, MD as PCP - General (Internal Medicine) Cherlyn Roberts, MD as Consulting Physician (Dermatology) Axel Filler, Larna Daughters, NP as Nurse Practitioner (Hematology and Oncology) Malachy Mood, MD as Consulting Physician (Hematology) Emelia Loron, MD as Consulting Physician (General Surgery)   I connected with Mervin Hack on 08/19/2019 at 10:40 AM EDT by video enabled telemedicine visit and verified that I am speaking with the correct person using two identifiers.  I discussed the limitations, risks, security and privacy concerns of performing an evaluation and management service by telephone and the availability of in person appointments. I also discussed with the patient that there may be a patient responsible charge related to this service. The patient expressed understanding and agreed to proceed.   Other persons participating in the visit and their role in the encounter:  None   Patient's location:  Her home  Provider's location:  My Office   CHIEF COMPLAINT: F/u of right breast cancer  SUMMARY OF ONCOLOGIC HISTORY: Oncology History Overview Note  Cancer Staging 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 breast in female, estrogen receptor positive (HCC) Staging form: Breast, AJCC 8th Edition - Clinical stage from 03/08/2016: Stage IA (cT1c, cN0, cM0, G2, ER: Positive, PR: Negative, HER2: Negative) - Signed by Malachy Mood, MD on 03/15/2016 - Pathologic stage from 04/12/2016: Stage IA (pT1c, pN0, cM0, G2, ER: Positive, PR: Negative, HER2: Negative, Oncotype DX score: 49) - Signed by Malachy Mood, MD on 05/05/2016     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 breast in female, estrogen receptor positive (HCC)  03/04/2016 Mammogram   Diagnostic bilateral mammogram and right breast ultrasound showed a 1.7 cm mass in the 12:00 position of the  right breast, highly suspicious for malignancy. There is a borderline abnormal right axillary lymph node, also suspicious for metastasis.    03/08/2016 Initial Diagnosis   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 breast in female, estrogen receptor positive (HCC)   03/08/2016 Initial Biopsy   Right breast 12:00 core needle biopsy showed invasive ductal carcinoma, grade 2, right axillary lymph node biopsy was negative.   03/08/2016 Receptors her2   ER 20% positive, PR negative, HER-2 negative, Ki-67 20%   03/30/2016 Imaging   MR Bilateral Breast 03/30/2016 IMPRESSION: Known unifocal right breast malignancy. No MRI evidence of malignancy in the left breast.   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  Genes Analyzed: 43 genes on [facility]'s Common Cancers panel (APC, ATM, AXIN2, BARD1, BMPR1A, BRCA1, BRCA2, BRIP1, CDH1, CDKN2A, CHEK2, DICER1, EPCAM, GREM1, HOXB13, KIT, MEN1, MLH1, MSH2, MSH6, MUTYH, NBN, NF1, PALB2, PDGFRA, PMS2, POLD1, POLE, PTEN, RAD50, RAD51C, RAD51D, SDHA, SDHB, SDHC, SDHD, SMAD4, SMARCA4, STK11, TP53, TSC1, TSC2, VHL).    04/12/2016 Surgery   Bilateral mastectomy and right sentinel lymph node biopsy, by Dr. Dwain Sarna   04/12/2016 Pathology Results   Right breast mastectomy showed invasive grade 2 invasive ductal carcinoma, 1.7 cm, margins were negative, 3 sentinel lymph nodes were negative, left simple mastectomy fibroadenoma, one benign node, no atypia or tumor.    05/04/2016 Oncotype testing   Recurrence score 49, high-risk, predicts 10 year risk of distant recurrence 32% with tamoxifen alone.   05/10/2016 Echocardiogram   Echo on 05/10/16 showed EF 55-60%.   05/20/2016 - 09/29/2016 Chemotherapy   dose dense Adriamycin and Cytoxan, every 2 weeks starting 05/20/16 for 4  cycles, followed by Taxol weekly (Taxol Changed to Abaraxane '100mg'$ /m2 on 09/01/17 due to skin rash).    10/2016 -  Anti-estrogen oral therapy    Anastrozole  started in 10/2016 and stopped after 2 weeks due to side effects. Switched to Letrozole 11/30/2016    12/27/2016 Imaging   Bone Density 12/27/16  ASSESSMENT: The BMD measured at Femur Neck Right is 0.938 g/cm2 with a T-score of -0.7. This patient is considered normal according to [facility]) criteria. Site Region Measured Date Measured Age YA BMD Significant CHANGE T-score DualFemur Neck Right 12/27/2016    63.0         -0.5    0.938 g/cm2 AP Spine  L1-L4      12/27/2016    63.0         0.0     1.200 g/cm2   12/27/2016 Imaging   12/27/2016 Bone Density ASSESSMENT: The BMD measured at Femur Neck Right is 0.938 g/cm2 with a T-score of -0.7.   01/05/2017 Survivorship   Survivorship Clinic with with Gardenia Phlegm, NP     01/09/2017 Surgery   LAPAROSCOPIC SALPINGO OOPHORECTOMY with pelvic washings by Dr. [name] and Dr. [name] 01/09/17  Diagnosis Ovaries and fallopian tubes, bilateral - BENIGN OVARIES AND FALLOPIAN TUBES WITH PARATUBAL CYST - NO MALIGNANCY IDENTIFIED    Breast cancer, right ([place])  04/12/2016 Initial Diagnosis   Breast cancer, right ([place])      CURRENT THERAPY:  Anastrozole started in 10/2016 and stopped after 2 weeks due to side effects. Switched to Letrozole 11/30/2016   INTERVAL HISTORY:  Anette Barra is here for a follow up of right breast cancer. She was last seen by me 6 months ago. They identified themselves by face to face video. She notes she is doing well. She notes she has been more active this year with adequate energy. She denies any major new changes since last visit. She notes her weight is stable. She denies arm movements. She has been on zinc, magnesium, Vit D and calcium. I reviewed her medication list with her. She is tolerating Letrozole well with manageable hot flashes. She notes her sleep is mostly adequate and she is able to return to sleep if hot flashes wake her up. She notes she received  both her COVID19 vaccinations.    REVIEW OF SYSTEMS:   Constitutional: Denies fevers, chills or abnormal weight loss Eyes: Denies blurriness of vision Ears, nose, mouth, throat, and face: Denies mucositis or sore throat Respiratory: Denies cough, dyspnea or wheezes Cardiovascular: Denies palpitation, chest discomfort or lower extremity swelling Gastrointestinal:  Denies nausea, heartburn or change in bowel habits Skin: Denies abnormal skin rashes Lymphatics: Denies new lymphadenopathy or easy bruising Neurological:Denies numbness, tingling or new weaknesses Behavioral/Psych: Mood is stable, no new changes  All other systems were reviewed with the patient and are negative.  MEDICAL HISTORY:  Past Medical History:  Diagnosis Date  . 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 Breast cancer, right breast ([place]) dx 03/08/2016---  oncolgoist-  [name]   Stage 1A (pT1, pN0, pM0)  Grade 2,  ER+, PR-, HER2 - ;  invasive ductal carcinoma s/p  bilateral mastectomies w/ sln bx's (negative) and completed chemotherapy (05-20-2016 to 09-29-2016)  . Eczema   . Family history of breast cancer   . Family history of genetic disease carrier    paternal relatives with a BRCA mutation  . Family history of ovarian cancer   .  History of abnormal cervical Pap smear age 5s  . History of exercise intolerance 06/27/2006   normal  . HSV-2 infection   . Hypertension   . Seasonal and perennial allergic rhinitis   . Wears contact lenses     SURGICAL HISTORY: Past Surgical History:  Procedure Laterality Date  . BREAST BIOPSY Right 04/13/2016   Procedure: Evacuation RIGHT Breast  Hematoma;  Surgeon: [name], MD;  Location: [place];  Service: General;  Laterality: Right;  . COLONOSCOPY    . DILATION AND CURETTAGE OF UTERUS  2008  . LAPAROSCOPIC SALPINGO OOPHERECTOMY Bilateral 01/09/2017   Procedure: LAPAROSCOPIC SALPINGO OOPHORECTOMY with pelvic washings;  Surgeon: [name], MD;  Location: [facility];  Service: Gynecology;  Laterality: Bilateral;  . MASTECTOMY W/ SENTINEL NODE BIOPSY Bilateral 04/12/2016   Procedure: BILATERAL TOTAL MASTECTOMIES  WITH RIGHT SENTINEL LYMPH NODE BIOPSY;  Surgeon: [name], MD;  Location: [place];  Service: General;  Laterality: Bilateral;  . PORTACATH PLACEMENT Right 05/19/2016   Procedure: INSERTION PORT-A-CATH WITH [place];  Surgeon: [name], MD;  Location: [place];  Service: General;  Laterality: Right;  . TRANSTHORACIC ECHOCARDIOGRAM  05/10/2016   ef 62-70%, grade 1 diastolic dyfuntion/  trivial TR    I have reviewed the social history and family history with the patient and they are unchanged from previous note.  ALLERGIES:  has No Known Allergies.  MEDICATIONS:  Current Outpatient Medications  Medication Sig Dispense Refill  . acyclovir ointment (ZOVIRAX) 5 % Apply 1 application topically every 3 (three) hours. 15 g 1  . aspirin EC 81 MG tablet Take 81 mg by mouth daily.    . Calcium-Magnesium-Vitamin D (CALCIUM 1200+D3 PO) Take 1 tablet by mouth every evening.    . cetirizine (ZYRTEC) 10 MG tablet Take 10 mg by mouth at bedtime.     . cloNIDine (CATAPRES) 0.2 MG tablet Take 1 tablet at Bedtime for BP 90 tablet 3  . escitalopram (LEXAPRO) 20 MG tablet Take 1 tablet (20 mg total) by mouth daily. 90 tablet 3  . fluticasone (FLONASE ALLERGY RELIEF) 50 MCG/ACT nasal spray Place 1 spray into both nostrils every morning.     . gabapentin (NEURONTIN) 300 MG capsule TAKE 1 CAPSULE(300 MG) BY MOUTH AT BEDTIME 90 capsule 1  . letrozole (FEMARA) 2.5 MG tablet TAKE 1 TABLET(2.5 MG) BY MOUTH EVERY MORNING 90 tablet 3  . losartan (COZAAR) 100 MG tablet Take 1 tablet  Daily for BP 90 tablet 0  . montelukast (SINGULAIR) 10 MG tablet Take 1 tablet Daily for Allergies 90 tablet 3  . triamcinolone cream (KENALOG) 0.1 % Apply 1 application topically 2 (two) times daily as needed (eczema). 453.6 g 1  .  valACYclovir (VALTREX) 500 MG tablet Take 1 tablet (500 mg total) by mouth 2 (two) times daily. Take for 3 days as needed. 30 tablet 1  . venlafaxine XR (EFFEXOR-XR) 75 MG 24 hr capsule TAKE 2 CAPSULES BY MOUTH EVERY MORNING AND 1 NIGHTLY FOR MOOD 270 capsule 0   No current facility-administered medications for this visit.    PHYSICAL EXAMINATION: ECOG PERFORMANCE STATUS: 0 - Asymptomatic  No vitals taken today, Exam not performed today   LABORATORY DATA:  I have reviewed the data as listed CBC Latest Ref Rng & Units 06/27/2019 02/20/2019 12/17/2018  WBC 3.8 - 10.8 Thousand/uL 5.4 5.0 4.5  Hemoglobin 11.7 - 15.5 g/dL 14.3 13.0 13.4  Hematocrit 35 - 45 % 43.5 38.8 40.5  Platelets 140 - 400 Thousand/uL 229 221 202     CMP Latest Ref Rng & Units 06/27/2019 02/20/2019 12/17/2018  Glucose 65 - 99 mg/dL 83 95 84  BUN 7 - 25 mg/dL '18 18 17  '$ Creatinine 0.50 - 0.99 mg/dL 0.58 0.82 0.83  Sodium 135 - 146 mmol/L 142 140 141  Potassium 3.5 - 5.3 mmol/L 4.7 4.3 4.1  Chloride 98 - 110 mmol/L 103 102 104  CO2 20 - 32 mmol/L '28 27 29  '$ Calcium 8.6 - 10.4 mg/dL 10.1 9.0 9.7  Total Protein 6.1 - 8.1 g/dL 7.0 7.0 7.1  Total Bilirubin 0.2 - 1.2 mg/dL 0.6 0.3 0.4  Alkaline Phos 38 - 126 U/L - 91 -  AST 10 - 35 U/L '20 21 19  '$ ALT 6 - 29 U/L '20 16 14      '$ RADIOGRAPHIC STUDIES: I have personally reviewed the radiological images as listed and agreed with the findings in the report. No results found.   ASSESSMENT & PLAN:  Chelsea Salinas is a 64 y.o. female with    1. 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 Oncotype RS 49, high risk -She was diagnosed in 02/2016. She is s/p b/l mastectomy and BSO due to her BRCA1+ mutation. She completed adjuvant chemo AC-T.  -She started antiestrogen withAnastrozole in 10/2016 and stopped after 2 weeks due to side effects. Switched to Letrozole  11/30/2016. She tolerated letrozole wellwith manageable hot flashes and joint pain. Plan forat least5-7years. -She is clinically doing very well. She remains active. Labs from PCP in 06/2019 was reviewed with her. She has no concerns.  -Continue Surveillance. Given she has had a b/l mastectomy there isno need for mammograms.  -Continue letrozole for 5 years  -F/u in 6 months for OV.    2. Hot flashes, secondary to letrozole, insomnia  -mainly at night lately and has interrupted her sleep but able to get enough hours of sleep -She has Effexor '75mg'$  and Gabapentin '300mg'$ . Meds managed by her PCP, She is also on Lexapro.    3. BRCA1 mutation (+) -We previously discussed the high risk of breast cancer and ovarian cancer due to the BRCA1 mutation. She is status post bilateral mastectomyand BSO. -Wepreviouslydiscussed slightly increased risk of pancreatic cancer, however she does not have any family history of pancreatic cancer, does not meet the criteria forroutinepancreatic cancer screening. -Her sister has done genetic testing, results were negative.She has no children.  -I also encouraged her to f/u with her Gyn, she can continue Pap Smear    4. Bone Health -Her 12/2016 DEXA was normal (-0.7 at right hip). Her 05/2019 was also normal (-0.7 at right hip). -Continue VitD and calcium daily    Plan: -Continue letrozole  -Lab and F/u in 6 months    No problem-specific Assessment & Plan notes found for this encounter.   No orders of the defined types were placed in this encounter.  I discussed the assessment and treatment plan with the patient. The patient was provided an opportunity to ask questions and all were answered. The patient agreed with the plan and demonstrated an understanding of the instructions.  The patient was advised to call back or seek an in-person evaluation if the symptoms worsen or if the condition fails to improve as anticipated.  The total time  spent in the appointment was 20 minutes.    Truitt Merle, MD 08/19/2019  Oneal Deputy, am acting as scribe for Truitt Merle, MD.   I have reviewed the above documentation for accuracy and completeness, and I agree with the above.

## 2019-08-19 ENCOUNTER — Other Ambulatory Visit: Payer: Self-pay | Admitting: Internal Medicine

## 2019-08-19 ENCOUNTER — Inpatient Hospital Stay: Payer: Medicare Other | Attending: Hematology | Admitting: Hematology

## 2019-08-19 DIAGNOSIS — I10 Essential (primary) hypertension: Secondary | ICD-10-CM

## 2019-08-19 DIAGNOSIS — C50111 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 female breast: Secondary | ICD-10-CM | POA: Diagnosis not present

## 2019-08-19 DIAGNOSIS — Z170 Estrogen receptor positive status [ER+]: Secondary | ICD-10-CM | POA: Diagnosis not present

## 2019-08-19 DIAGNOSIS — Z1509 Genetic susceptibility to other malignant neoplasm: Secondary | ICD-10-CM

## 2019-08-19 DIAGNOSIS — Z1501 Genetic susceptibility to malignant neoplasm of breast: Secondary | ICD-10-CM

## 2019-08-20 ENCOUNTER — Encounter: Payer: Self-pay | Admitting: Hematology

## 2019-08-27 ENCOUNTER — Other Ambulatory Visit: Payer: Self-pay | Admitting: Physician Assistant

## 2019-08-27 MED ORDER — ALBUTEROL SULFATE HFA 108 (90 BASE) MCG/ACT IN AERS: 2.0000 | INHALATION_SPRAY | Freq: Four times a day (QID) | RESPIRATORY_TRACT | 1 refills | Status: DC | PRN

## 2019-08-27 MED ORDER — PREDNISONE 20 MG PO TABS: ORAL_TABLET | ORAL | 0 refills | Status: DC

## 2019-09-19 ENCOUNTER — Other Ambulatory Visit: Payer: Self-pay | Admitting: Internal Medicine

## 2019-09-19 ENCOUNTER — Other Ambulatory Visit: Payer: Self-pay | Admitting: Nurse Practitioner

## 2019-09-19 DIAGNOSIS — R232 Flushing: Secondary | ICD-10-CM

## 2019-09-20 ENCOUNTER — Telehealth: Payer: Self-pay | Admitting: Hematology

## 2019-09-20 NOTE — Telephone Encounter (Signed)
Scheduled appointments per 8/13 scheduling message. Left message for patient with appointment date and time.

## 2019-09-23 ENCOUNTER — Encounter: Payer: Self-pay | Admitting: Hematology

## 2019-09-27 ENCOUNTER — Other Ambulatory Visit: Payer: Self-pay | Admitting: Internal Medicine

## 2019-09-27 DIAGNOSIS — I10 Essential (primary) hypertension: Secondary | ICD-10-CM

## 2019-11-12 ENCOUNTER — Other Ambulatory Visit: Payer: Self-pay | Admitting: Adult Health

## 2019-12-04 ENCOUNTER — Encounter: Payer: BLUE CROSS/BLUE SHIELD | Admitting: Physician Assistant

## 2019-12-07 ENCOUNTER — Other Ambulatory Visit: Payer: Self-pay | Admitting: Nurse Practitioner

## 2019-12-17 ENCOUNTER — Encounter: Payer: Medicare Other | Admitting: Physician Assistant

## 2019-12-19 ENCOUNTER — Other Ambulatory Visit: Payer: Self-pay | Admitting: Adult Health

## 2019-12-19 DIAGNOSIS — R232 Flushing: Secondary | ICD-10-CM

## 2019-12-27 ENCOUNTER — Encounter: Payer: Self-pay | Admitting: Adult Health

## 2019-12-27 NOTE — Progress Notes (Signed)
ANNUAL MEDICARE VISIT  Assessment and Plan:  Annual Medicare Visit 1 year  Essential hypertension - continue medications, DASH diet, exercise and monitor at home. Call if greater than 130/80.  -     CBC with Differential/Platelet -     CMP/GFR -     TSH  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 breast in female, estrogen receptor positive ([place]) Continue follow up with oncology  BRCA1 positive Continue follow up with oncology   Vitamin D deficiency Continue supplement   Allergic state, subsequent encounter -     montelukast (SINGULAIR) 10 MG tablet; TAKE 1 TABLET(10 MG) BY MOUTH DAILY  Uncomplicated asthma, unspecified asthma severity, unspecified whether persistent -     montelukast (SINGULAIR) 10 MG tablet; TAKE 1 TABLET(10 MG) BY MOUTH DAILY  Eczema, unspecified type -     triamcinolone PRN  Abnormal glucose (hx of prediabetes) Recent A1Cs at goal Discussed diet/exercise, weight management  Defer A1C; check CMP  Hyperlipidemia Continue low cholesterol diet and exercise.  Check lipid panel.  -     Lipid panel  Medication management -     Magnesium  Hot flashes -  Continue medications  BMI 22   Discussed med's effects and SE's. Screening labs and tests as requested with regular follow-up as recommended. Future Appointments  Date Time Provider [place]  03/23/2020  9:30 AM [facility] None  03/23/2020 10:00 AM Truitt Merle, MD [facility] None  06/29/2020 10:00 AM Liane Comber, NP [facility] None  12/29/2020 11:00 AM Liane Comber, NP [facility] None    HPI 64 y.o. female  presents for 3 month follow up and annual medicare visit. She has Hypertension; Asthma; Allergy; Eczema; Mixed hyperlipidemia; Other abnormal glucose (Hx of prediabetes); Vitamin D deficiency; Hot flashes; 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 breast in female, estrogen receptor positive ([place]); BRCA1 positive; Genetic testing; Breast cancer, right  ([place]); Anemia due to antineoplastic chemotherapy; Port-A-Cath in place; and Scar tissue on their problem list.  She no longer follows with Dr. Quincy Simmonds, GYN, had salpingooopherectomy, no recent abnormal PAP.   She has atopy with eczema, allergies, and asthma. She is on flonase for allergies, needs refill albuterol, has not been using it.   She was diagnosed with right breast cancer ER +, PR+ HER2 - 02/2016, following with Dr. Burr Medico, she is on letrozole, had double mastectomy with [name] 04/12/2016, BRCA 1+, had BSP dec 2018.    She is on lexapro, effexor (has been on both and done well for many years), gabapentin and catapress for hot flashes with letrozole that help.   BMI is Body mass index is 22.89 kg/m., she has been working on diet and exercise. She walks 3 miles a day most days of the week, just rejoined [employer], wants to do more yoga.  Wt Readings from Last 3 Encounters:  12/30/19 144 lb (65.3 kg)  06/27/19 144 lb (65.3 kg)  02/20/19 146 lb 9.6 oz (66.5 kg)    Her blood pressure has been controlled at home, today their BP is BP: 120/74 She does workout, she is walking, she is riding horses, volunteering. She is officially retired.   She denies chest pain, shortness of breath, dizziness.   She is not on cholesterol medication and denies myalgias. Her cholesterol is not at goal, atypical elevation at last visit. Has started benefiber. The cholesterol last visit was:   Lab Results  Component Value Date   CHOL 242 (H) 06/27/2019   HDL 90  06/27/2019   [place] 131 (H) 06/27/2019   TRIG 103 06/27/2019   CHOLHDL 2.7 06/27/2019   She has been working on diet/exercise for glucose management, hx of prediabetes (6.7% in 2016). Last A1C in the office was:  Lab Results  Component Value Date   HGBA1C 5.2 05/29/2018    Last GFR:  Lab Results  Component Value Date   GFRNONAA 97 06/27/2019   Patient is on Vitamin D supplement, one a day, unsure of dosage.  Lab Results  Component Value  Date   VD25OH 45 06/27/2019     Current Medications:  Current Outpatient Medications on File Prior to Visit  Medication Sig Dispense Refill  . acyclovir ointment (ZOVIRAX) 5 % Apply 1 application topically every 3 (three) hours. 15 g 1  . albuterol (VENTOLIN HFA) 108 (90 Base) MCG/ACT inhaler Inhale 2 puffs into the lungs every 6 (six) hours as needed for wheezing or shortness of breath. 18 g 1  . aspirin EC 81 MG tablet Take 81 mg by mouth daily.    . Calcium-Magnesium-Vitamin D (CALCIUM 1200+D3 PO) Take 1 tablet by mouth every evening. Taking just the Magnesium-Vitamin D    . cetirizine (ZYRTEC) 10 MG tablet Take 10 mg by mouth at bedtime.     . cloNIDine (CATAPRES) 0.2 MG tablet TAKE 1 TABLET BY MOUTH ONCE DAILY AT BEDTIME FOR BLOOD PRESSURE 90 tablet 1  . escitalopram (LEXAPRO) 20 MG tablet Take 1 tablet (20 mg total) by mouth daily. 90 tablet 3  . fluticasone (FLONASE ALLERGY RELIEF) 50 MCG/ACT nasal spray Place 1 spray into both nostrils every morning.     . gabapentin (NEURONTIN) 300 MG capsule Take 1 capsule by mouth at bedtime 90 capsule 0  . letrozole (FEMARA) 2.5 MG tablet TAKE 1 TABLET(2.5 MG) BY MOUTH EVERY MORNING 90 tablet 3  . losartan (COZAAR) 100 MG tablet Take 1 tablet by mouth once daily for blood pressure 90 tablet 1  . montelukast (SINGULAIR) 10 MG tablet Take 1 tablet Daily for Allergies 90 tablet 3  . triamcinolone cream (KENALOG) 0.1 % Apply 1 application topically 2 (two) times daily as needed (eczema). 453.6 g 1  . valACYclovir (VALTREX) 500 MG tablet Take 1 tablet (500 mg total) by mouth 2 (two) times daily. Take for 3 days as needed. 30 tablet 1  . venlafaxine XR (EFFEXOR-XR) 75 MG 24 hr capsule TAKE 2 CAPSULES BY MOUTH IN THE MORNING AND 1 NIGHTLY 270 capsule 0   No current facility-administered medications on file prior to visit.   Health Maintenance:   Immunization History  Administered Date(s) Administered  . Influenza Inj Mdck Quad Pf 11/01/2018  .  Influenza Inj Mdck Quad With Preservative 11/22/2016, 11/23/2017  . Influenza, Seasonal, Injecte, Preservative Fre 11/16/2015  . Moderna SARS-COVID-2 Vaccination 04/08/2019, 12/24/2019  . Pneumococcal Conjugate-13 12/17/2018  . Pneumococcal Polysaccharide-23 11/13/2014  . Td 02/08/1996, 02/07/2006  . Tdap 04/27/2017  . Zoster Recombinat (Shingrix) 11/01/2018   Tetanus: 2019  Pneumovax: DUE TODAY Prevnar 13: 2020 Flu vaccine: 2020 TODAY shingrix: 2020 but never got 2nd one due to [place]: completed will message dates, Levan Hurst, 3/3   Pap: 04/2016 Dr. Quincy Simmonds- does not need to go back MGM: 02/2016 s/p masectomy, Dr. Annamaria Boots doing manual exams  DEXA: 05/2019 NORMAL Colonoscopy: Nov 2017 Dr. Watt Climes 10 years EGD: N/A  Last eye: Dr. [place], 2020, goes every other year Last dental: Dr. Laurance Flatten, 2021, q41m Patient Care Team: MUnk [name] MD as PCP -  General (Internal Medicine) [name], MD as Consulting Physician (Dermatology) [name] [name], NP as Nurse Practitioner (Hematology and Oncology) Truitt Merle, MD as Consulting Physician (Hematology) [name], MD as Consulting Physician (General Surgery)   Medical History:  Past Medical History:  Diagnosis Date  . 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 Breast cancer, right breast ([place]) dx 03/08/2016---  oncolgoist-  [name]   Stage 1A (pT1, pN0, pM0)  Grade 2,  ER+, PR-, HER2 - ;  invasive ductal carcinoma s/p  bilateral mastectomies w/ sln bx's (negative) and completed chemotherapy (05-20-2016 to 09-29-2016)  . Eczema   . Family history of breast cancer   . Family history of genetic disease carrier    paternal relatives with a BRCA mutation  . Family history of ovarian cancer   . History of abnormal cervical Pap smear age 67s  . History of exercise intolerance 06/27/2006   normal  . HSV-2 infection   . Hypertension   . Prediabetes 05/26/2014  . Seasonal and perennial  allergic rhinitis   . Wears contact lenses    Allergies No Known Allergies  SURGICAL HISTORY She  has a past surgical history that includes Dilation and curettage of uterus (2008); Mastectomy w/ sentinel node biopsy (Bilateral, 04/12/2016); Breast biopsy (Right, 04/13/2016); Colonoscopy; Portacath placement (Right, 05/19/2016); transthoracic echocardiogram (05/10/2016); and Laparoscopic salpingo oophorectomy (Bilateral, 01/09/2017). FAMILY HISTORY Her family history includes Breast cancer in her cousin; Heart attack in her father; Heart disease in her father; Hypertension in her father; Ovarian cancer in her cousin, cousin, paternal aunt, paternal aunt, and another family member; Polymyalgia rheumatica in her mother; Prostate cancer in her father. SOCIAL HISTORY She  reports that she quit smoking about 23 years ago. She has a 25.00 pack-year smoking history. She has never used smokeless tobacco. She reports current alcohol use of about 14.0 standard drinks of alcohol per week. She reports that she does not use drugs.  MEDICARE WELLNESS OBJECTIVES: Physical activity: Current Exercise Habits: Home exercise routine, Type of exercise: walking;yoga, Time (Minutes): 45, Frequency (Times/Week): 5, Weekly Exercise (Minutes/Week): 225, Intensity: Mild, Exercise limited by: None identified Cardiac risk factors: Cardiac Risk Factors include: advanced age (>6mn, >>62women);dyslipidemia;hypertension;smoking/ tobacco exposure Depression/mood screen:   [facility]  Decreased Interest 0  Down, Depressed, Hopeless 0  PHQ - 2 Score 0    ADLs:  In your present state of health, do you have any difficulty performing the following activities: 12/30/2019  Hearing? N  Vision? N  Difficulty concentrating or making decisions? N  Walking or climbing stairs? N  Dressing or bathing? N  Doing errands, shopping? N  Some recent data might be hidden     Cognitive Testing  Alert? Yes  Normal  Appearance?Yes  Oriented to person? Yes  Place? Yes   Time? Yes  Recall of three objects?  Yes  Can perform simple calculations? Yes  Displays appropriate judgment?Yes  Can read the correct time from a watch face?Yes  EOL planning: Does Patient Have a Medical Advance Directive?: No Would patient like information on creating a medical advance directive?: No - Patient declined   Review of Systems  Constitutional: Negative for malaise/fatigue and weight loss.  HENT: Negative for hearing loss and tinnitus.   Eyes: Negative for blurred vision and double vision.  Respiratory: Negative for cough, sputum production, shortness of breath and wheezing.   Cardiovascular: Negative for chest pain, palpitations, orthopnea, claudication, leg swelling and PND.  Gastrointestinal: Negative for abdominal pain, blood in stool, constipation, diarrhea, heartburn, melena, nausea and vomiting.  Genitourinary: Negative.   Musculoskeletal: Negative for falls, joint pain and myalgias.  Skin: Negative for rash.  Neurological: Negative for dizziness, tingling, sensory change, weakness and headaches.  Endo/Heme/Allergies: Negative for polydipsia.  Psychiatric/Behavioral: Negative.  Negative for depression, memory loss, substance abuse and suicidal ideas. The patient is not nervous/anxious and does not have insomnia.   All other systems reviewed and are negative.    Physical Exam: Estimated body mass index is 22.89 kg/m as calculated from the following:   Height as of 06/27/19: 5' 6.5" (1.689 m).   Weight as of this encounter: 144 lb (65.3 kg). BP 120/74   Pulse 79   Temp (!) 97.3 F (36.3 C)   Wt 144 lb (65.3 kg)   LMP 02/07/1998 (Approximate)   SpO2 99%   BMI 22.89 kg/m  General Appearance: Well nourished, in no apparent distress. Eyes: PERRLA, EOMs, conjunctiva no swelling or erythema Sinuses: No Frontal/maxillary tenderness ENT/Mouth: Ext aud canals clear, normal light reflex with TMs without  erythema, bulging.  Good dentition. No erythema, swelling, or exudate on post pharynx. Tonsils not swollen or erythematous. Hearing normal.  Neck: Supple, thyroid normal. No bruits Respiratory: Respiratory effort normal, BS equal bilaterally without rales, rhonchi, wheezing or stridor. Cardio: RRR without murmurs, rubs or gallops. Brisk peripheral pulses without edema.  Chest: symmetric, with normal excursions and percussion. Breasts: defer Abdomen: Soft, +BS. Non tender, no guarding, rebound, hernias, masses, or organomegaly. .  Lymphatics: Non tender without lymphadenopathy.  Genitourinary: defer Musculoskeletal: Full ROM all peripheral extremities,5/5 strength, and normal gait. Skin:  Warm, dry without lesions, ecchymosis.  Neuro: Cranial nerves intact, reflexes equal bilaterally. Normal muscle tone, no cerebellar symptoms. Sensation intact.  Psych: Awake and oriented X 3, normal affect, Insight and Judgment appropriate.    Medicare Attestation I have personally reviewed: The patient's medical and social history Their use of alcohol, tobacco or illicit drugs Their current medications and supplements The patient's functional ability including ADLs,fall risks, home safety risks, cognitive, and hearing and visual impairment Diet and physical activities Evidence for depression or mood disorders  The patient's weight, height, BMI, and visual acuity have been recorded in the chart.  I have made referrals, counseling, and provided education to the patient based on review of the above and I have provided the patient with a written personalized care plan for preventive services.    Gorden Harms Karilynn Carranza 11:40 AM

## 2019-12-30 ENCOUNTER — Ambulatory Visit: Payer: Medicare Other | Admitting: Physician Assistant

## 2019-12-30 ENCOUNTER — Encounter: Payer: Self-pay | Admitting: Adult Health

## 2019-12-30 ENCOUNTER — Other Ambulatory Visit: Payer: Self-pay

## 2019-12-30 ENCOUNTER — Ambulatory Visit (INDEPENDENT_AMBULATORY_CARE_PROVIDER_SITE_OTHER): Payer: Medicare Other | Admitting: Adult Health

## 2019-12-30 VITALS — BP 120/74 | HR 79 | Temp 97.3°F | Wt 144.0 lb

## 2019-12-30 DIAGNOSIS — Z0001 Encounter for general adult medical examination with abnormal findings: Secondary | ICD-10-CM

## 2019-12-30 DIAGNOSIS — T451X5A Adverse effect of antineoplastic and immunosuppressive drugs, initial encounter: Secondary | ICD-10-CM

## 2019-12-30 DIAGNOSIS — D6481 Anemia due to antineoplastic chemotherapy: Secondary | ICD-10-CM

## 2019-12-30 DIAGNOSIS — Z6822 Body mass index (BMI) 22.0-22.9, adult: Secondary | ICD-10-CM

## 2019-12-30 DIAGNOSIS — R6889 Other general symptoms and signs: Secondary | ICD-10-CM | POA: Diagnosis not present

## 2019-12-30 DIAGNOSIS — I10 Essential (primary) hypertension: Secondary | ICD-10-CM

## 2019-12-30 DIAGNOSIS — R232 Flushing: Secondary | ICD-10-CM | POA: Diagnosis not present

## 2019-12-30 DIAGNOSIS — L309 Dermatitis, unspecified: Secondary | ICD-10-CM | POA: Diagnosis not present

## 2019-12-30 DIAGNOSIS — Z23 Encounter for immunization: Secondary | ICD-10-CM | POA: Diagnosis not present

## 2019-12-30 DIAGNOSIS — J45909 Unspecified asthma, uncomplicated: Secondary | ICD-10-CM | POA: Diagnosis not present

## 2019-12-30 DIAGNOSIS — R7309 Other abnormal glucose: Secondary | ICD-10-CM

## 2019-12-30 DIAGNOSIS — Z170 Estrogen receptor positive status [ER+]: Secondary | ICD-10-CM

## 2019-12-30 DIAGNOSIS — Z0000 Encounter for general adult medical examination without abnormal findings: Secondary | ICD-10-CM

## 2019-12-30 DIAGNOSIS — E559 Vitamin D deficiency, unspecified: Secondary | ICD-10-CM

## 2019-12-30 DIAGNOSIS — E782 Mixed hyperlipidemia: Secondary | ICD-10-CM

## 2019-12-30 DIAGNOSIS — C50111 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 female breast: Secondary | ICD-10-CM

## 2019-12-30 DIAGNOSIS — Z95828 Presence of other vascular implants and grafts: Secondary | ICD-10-CM

## 2019-12-30 NOTE — Patient Instructions (Addendum)
Chelsea Salinas , Thank you for taking time to come for your Medicare Wellness Visit. I appreciate your ongoing commitment to your health goals. Please review the following plan we discussed and let me know if I can assist you in the future.   These are the goals we discussed: Goals    . LDL CALC < 130       This is a list of the screening recommended for you and due dates:  Health Maintenance  Topic Date Due  . Flu Shot  09/08/2019  . Pneumonia vaccines (2 of 2 - PPSV23) 12/17/2019  . Colon Cancer Screening  12/22/2025  . Tetanus Vaccine  04/28/2027  . DEXA scan (bone density measurement)  Completed  . COVID-19 Vaccine  Completed  .  Hepatitis C: One time screening is recommended by Center for Disease Control  (CDC) for  adults born from [birth year] through 1965.   Completed        Preventing High Cholesterol Cholesterol is a white, waxy substance similar to fat that the human body needs to help build cells. The liver makes all the cholesterol that a person's body needs. Having high cholesterol (hypercholesterolemia) increases a person's risk for heart disease and stroke. Extra (excess) cholesterol comes from the food the person eats. High cholesterol can often be prevented with diet and lifestyle changes. If you already have high cholesterol, you can control it with diet and lifestyle changes and with medicine. How can high cholesterol affect me? If you have high cholesterol, deposits (plaques) may build up on the walls of your arteries. The arteries are the blood vessels that carry blood away from your heart. Plaques make the arteries narrower and stiffer. This can limit or block blood flow and cause blood clots to form. Blood clots:  Are tiny balls of cells that form in your blood.  Can move to the heart or brain, causing a heart attack or stroke. Plaques in arteries greatly increase your risk for heart attack and stroke.Making diet and lifestyle changes can reduce your risk for these  conditions that may threaten your life. What can increase my risk? This condition is more likely to develop in people who:  Eat foods that are high in saturated fat or cholesterol. Saturated fat is mostly found in: ? Foods that contain animal fat, such as red meat and some dairy products. ? Certain fatty foods made from plants, such as tropical oils.  Are overweight.  Are not getting enough exercise.  Have a family history of high cholesterol. What actions can I take to prevent this? Nutrition   Eat less saturated fat.  Avoid trans fats (partially hydrogenated oils). These are often found in margarine and in some baked goods, fried foods, and snacks bought in packages.  Avoid precooked or cured meat, such as sausages or meat loaves.  Avoid foods and drinks that have added sugars.  Eat more fruits, vegetables, and whole grains.  Choose healthy sources of protein, such as fish, poultry, lean cuts of red meat, beans, peas, lentils, and nuts.  Choose healthy sources of fat, such as: ? Nuts. ? Vegetable oils, especially olive oil. ? Fish that have healthy fats (omega-3 fatty acids), such as mackerel or salmon. The items listed above may not be a complete list of recommended foods and beverages. Contact a dietitian for more information. Lifestyle  Lose weight if you are overweight. Losing 5-10 lb (2.3-4.5 kg) can help prevent or control high cholesterol. It can also lower your  risk for diabetes and high blood pressure. Ask your health care provider to help you with a diet and exercise plan to lose weight safely.  Do not use any products that contain nicotine or tobacco, such as cigarettes, e-cigarettes, and chewing tobacco. If you need help quitting, ask your health care provider.  Limit your alcohol intake. ? Do not drink alcohol if:  Your health care provider tells you not to drink.  You are pregnant, may be pregnant, or are planning to become pregnant. ? If you drink  alcohol:  Limit how much you use to:  0-1 drink a day for women.  0-2 drinks a day for men.  Be aware of how much alcohol is in your drink. In the [place] one drink equals one 12 oz bottle of beer (355 mL), one 5 oz glass of wine (148 mL), or one 1 oz glass of hard liquor (44 mL). Activity   Get enough exercise. Each week, do at least 150 minutes of exercise that takes a medium level of effort (moderate-intensity exercise). ? This is exercise that:  Makes your heart beat faster and makes you breathe harder than usual.  Allows you to still be able to talk. ? You could exercise in short sessions several times a day or longer sessions a few times a week. For example, on 5 days each week, you could walk fast or ride your bike 3 times a day for 10 minutes each time.  Do exercises as told by your health care provider. Medicines  In addition to diet and lifestyle changes, your health care provider may recommend medicines to help lower cholesterol. This may be a medicine to lower the amount of cholesterol your liver makes. You may need medicine if: ? Diet and lifestyle changes do not lower your cholesterol enough. ? You have high cholesterol and other risk factors for heart disease or stroke.  Take over-the-counter and prescription medicines only as told by your health care provider. General information  Manage your risk factors for high cholesterol. Talk with your health care provider about all your risk factors and how to lower your risk.  Manage other conditions that you have, such as diabetes or high blood pressure (hypertension).  Have blood tests to check your cholesterol levels at regular points in time as told by your health care provider.  Keep all follow-up visits as told by your health care provider. This is important. Where to find more information  American Heart Association: www.heart.org  National Heart, Lung, and Blood Institute: https://wilson-eaton.com/ Summary  High  cholesterol increases your risk for heart disease and stroke. By keeping your cholesterol level low, you can reduce your risk for these conditions.  High cholesterol can often be prevented with diet and lifestyle changes.  Work with your health care provider to manage your risk factors, and have your blood tested regularly. This information is not intended to replace advice given to you by your health care provider. Make sure you discuss any questions you have with your health care provider. Document Revised: 05/18/2018 Document Reviewed: 10/03/2015 Elsevier Patient Education  2020 [employer].

## 2019-12-30 NOTE — Addendum Note (Signed)
Addended by: Chancy Hurter on: 12/30/2019 11:58 AM   Modules accepted: Orders

## 2019-12-31 LAB — CBC WITH DIFFERENTIAL/PLATELET
Absolute Monocytes: 524 cells/uL (ref 200–950)
Basophils Absolute: 41 cells/uL (ref 0–200)
Basophils Relative: 0.9 %
Eosinophils Absolute: 101 cells/uL (ref 15–500)
Eosinophils Relative: 2.2 %
HCT: 40.6 % (ref 35.0–45.0)
Hemoglobin: 13.9 g/dL (ref 11.7–15.5)
Lymphs Abs: 1725 cells/uL (ref 850–3900)
MCH: 33.7 pg — ABNORMAL HIGH (ref 27.0–33.0)
MCHC: 34.2 g/dL (ref 32.0–36.0)
MCV: 98.5 fL (ref 80.0–100.0)
MPV: 10.5 fL (ref 7.5–12.5)
Monocytes Relative: 11.4 %
Neutro Abs: 2208 cells/uL (ref 1500–7800)
Neutrophils Relative %: 48 %
Platelets: 213 10*3/uL (ref 140–400)
RBC: 4.12 10*6/uL (ref 3.80–5.10)
RDW: 12.1 % (ref 11.0–15.0)
Total Lymphocyte: 37.5 %
WBC: 4.6 10*3/uL (ref 3.8–10.8)

## 2019-12-31 LAB — COMPLETE METABOLIC PANEL WITH GFR
AG Ratio: 1.8 (calc) (ref 1.0–2.5)
ALT: 14 U/L (ref 6–29)
AST: 18 U/L (ref 10–35)
Albumin: 4.5 g/dL (ref 3.6–5.1)
Alkaline phosphatase (APISO): 86 U/L (ref 37–153)
BUN: 15 mg/dL (ref 7–25)
CO2: 30 mmol/L (ref 20–32)
Calcium: 10.2 mg/dL (ref 8.6–10.4)
Chloride: 103 mmol/L (ref 98–110)
Creat: 0.59 mg/dL (ref 0.50–0.99)
GFR, Est African American: 111 mL/min/{1.73_m2} (ref >=60)
GFR, Est Non African American: 96 mL/min/{1.73_m2} (ref >=60)
Globulin: 2.5 g/dL (calc) (ref 1.9–3.7)
Glucose, Bld: 93 mg/dL (ref 65–99)
Potassium: 4.4 mmol/L (ref 3.5–5.3)
Sodium: 144 mmol/L (ref 135–146)
Total Bilirubin: 0.4 mg/dL (ref 0.2–1.2)
Total Protein: 7 g/dL (ref 6.1–8.1)

## 2019-12-31 LAB — MAGNESIUM: Magnesium: 1.9 mg/dL (ref 1.5–2.5)

## 2019-12-31 LAB — LIPID PANEL
Cholesterol: 226 mg/dL — ABNORMAL HIGH (ref <200)
HDL: 94 mg/dL (ref >=50)
LDL Cholesterol (Calc): 107 mg/dL (calc) — ABNORMAL HIGH
Non-HDL Cholesterol (Calc): 132 mg/dL (calc) — ABNORMAL HIGH (ref <130)
Total CHOL/HDL Ratio: 2.4 (calc) (ref <5.0)
Triglycerides: 136 mg/dL (ref <150)

## 2019-12-31 LAB — TSH: TSH: 1.51 mIU/L (ref 0.40–4.50)

## 2020-02-13 ENCOUNTER — Other Ambulatory Visit: Payer: Self-pay | Admitting: Adult Health

## 2020-02-19 ENCOUNTER — Other Ambulatory Visit: Payer: Self-pay | Admitting: Hematology

## 2020-02-20 ENCOUNTER — Other Ambulatory Visit: Payer: Self-pay

## 2020-02-20 MED ORDER — ESCITALOPRAM OXALATE 20 MG PO TABS: 20.0000 mg | ORAL_TABLET | Freq: Every day | ORAL | 3 refills | Status: DC

## 2020-02-20 NOTE — Telephone Encounter (Signed)
Med refill [facility]

## 2020-02-25 ENCOUNTER — Encounter: Payer: Self-pay | Admitting: Hematology

## 2020-03-02 ENCOUNTER — Other Ambulatory Visit: Payer: Self-pay | Admitting: Nurse Practitioner

## 2020-03-02 MED ORDER — GABAPENTIN 300 MG PO CAPS
300.0000 mg | ORAL_CAPSULE | Freq: Every day | ORAL | 1 refills | Status: DC
Start: 2020-03-02 — End: 2020-06-29

## 2020-03-03 ENCOUNTER — Other Ambulatory Visit: Payer: Self-pay

## 2020-03-03 NOTE — Progress Notes (Signed)
Error

## 2020-03-20 ENCOUNTER — Other Ambulatory Visit: Payer: Self-pay | Admitting: Adult Health

## 2020-03-20 DIAGNOSIS — I10 Essential (primary) hypertension: Secondary | ICD-10-CM

## 2020-03-20 NOTE — Progress Notes (Signed)
Chelsea Salinas   [phone] [phone]   Clinic Follow up Note   Patient Care Team: [name], MD as PCP - General (Internal Medicine) [name], MD as Consulting Physician (Dermatology) [name], [name], NP as Nurse Practitioner (Hematology and Oncology) Truitt Merle, MD as Consulting Physician (Hematology) [name], MD as Consulting Physician (General Surgery)  Date of Service:  03/23/2020  CHIEF COMPLAINT: F/u of right breast cancer  SUMMARY OF ONCOLOGIC HISTORY: Oncology History Overview Note  Cancer Staging 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 breast in female, estrogen receptor positive ([place]) Staging form: Breast, AJCC 8th Edition - Clinical stage from 03/08/2016: Stage IA (cT1c, cN0, cM0, G2, ER: Positive, PR: Negative, HER2: Negative) - Signed by Truitt Merle, MD on 03/15/2016 - Pathologic stage from 04/12/2016: Stage IA (pT1c, pN0, cM0, G2, ER: Positive, PR: Negative, HER2: Negative, Oncotype DX score: 49) - Signed by Truitt Merle, MD on 05/05/2016     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 breast in female, estrogen receptor positive ([place])  03/04/2016 Mammogram   Diagnostic bilateral mammogram and right breast ultrasound showed a 1.7 cm mass in the 12:00 position of the right breast, highly suspicious for malignancy. There is a borderline abnormal right axillary lymph node, also suspicious for metastasis.    03/08/2016 Initial Diagnosis   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 breast in female, estrogen receptor positive ([place])   03/08/2016 Initial Biopsy   Right breast 12:00 core needle biopsy showed invasive ductal carcinoma, grade 2, right axillary lymph node biopsy was negative.   03/08/2016 Receptors her2   ER 20% positive, PR negative, HER-2 negative, Ki-67 20%   03/30/2016 Imaging   MR Bilateral Breast 03/30/2016 IMPRESSION: Known unifocal right breast malignancy. No MRI evidence of  malignancy in the left breast.   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  Genes Analyzed: 43 genes on [facility]'s Common Cancers panel (APC, ATM, AXIN2, BARD1, BMPR1A, BRCA1, BRCA2, BRIP1, CDH1, CDKN2A, CHEK2, DICER1, EPCAM, GREM1, HOXB13, KIT, MEN1, MLH1, MSH2, MSH6, MUTYH, NBN, NF1, PALB2, PDGFRA, PMS2, POLD1, POLE, PTEN, RAD50, RAD51C, RAD51D, SDHA, SDHB, SDHC, SDHD, SMAD4, SMARCA4, STK11, TP53, TSC1, TSC2, VHL).    04/12/2016 Surgery   Bilateral mastectomy and right sentinel lymph node biopsy, by [name]   04/12/2016 Pathology Results   Right breast mastectomy showed invasive grade 2 invasive ductal carcinoma, 1.7 cm, margins were negative, 3 sentinel lymph nodes were negative, left simple mastectomy fibroadenoma, one benign node, no atypia or tumor.    05/04/2016 Oncotype testing   Recurrence score 49, high-risk, predicts 10 year risk of distant recurrence 32% with tamoxifen alone.   05/10/2016 Echocardiogram   Echo on 05/10/16 showed EF 55-60%.   05/20/2016 - 09/29/2016 Chemotherapy   dose dense Adriamycin and Cytoxan, every 2 weeks starting 05/20/16 for 4 cycles, followed by Taxol weekly (Taxol Changed to Abaraxane 165m/m2 on 09/01/17 due to skin rash).    10/2016 -  Anti-estrogen oral therapy    Anastrozole started in 10/2016 and stopped after 2 weeks due to side effects. Switched to Letrozole 11/30/2016    12/27/2016 Imaging   Bone Density 12/27/16  ASSESSMENT: The BMD measured at Femur Neck Right is 0.938 g/cm2 with a T-score of -0.7. This patient is considered normal according to [facility] criteria. Site Region Measured Date Measured Age YA BMD Significant CHANGE T-score DualFemur Neck Right 12/27/2016    63.0         -  0.5    0.938 g/cm2 AP Spine  L1-L4      12/27/2016    63.0         0.0     1.200 g/cm2   12/27/2016 Imaging   12/27/2016 Bone Density ASSESSMENT: The BMD measured at Femur Neck Right is  0.938 g/cm2 with a T-score of -0.7.   01/05/2017 Survivorship   Survivorship Clinic with with Gardenia Phlegm, NP     01/09/2017 Surgery   LAPAROSCOPIC SALPINGO OOPHORECTOMY with pelvic washings by Dr. [name] and Dr. [name] 01/09/17  Diagnosis Ovaries and fallopian tubes, bilateral - BENIGN OVARIES AND FALLOPIAN TUBES WITH PARATUBAL CYST - NO MALIGNANCY IDENTIFIED    Breast cancer, right ([place])  04/12/2016 Initial Diagnosis   Breast cancer, right ([place])      CURRENT THERAPY:  Anastrozole started in 10/2016 and stopped after 2 weeks due to side effects. Switched to Letrozole 11/30/2016  INTERVAL HISTORY:  Chelsea Salinas is here for a follow up of right breast cancer. She was last seen by me 7 months ago. She presents to the clinic alone. She denies any new major changes. She notes she has restated going to the gym twice a week. She notes she walks 4 days a week and continues to horseback ride. She notes her BP is better at home. I reviewed her medication list with her. She also notes having hot flashes. Gabapentin 177m and lexapro and Effexor. She notes joint pain this week is likely related to her exercise. She notes she will see her other physicians every 6 months.      REVIEW OF SYSTEMS:   Constitutional: Denies fevers, chills or abnormal weight loss Eyes: Denies blurriness of vision Ears, nose, mouth, throat, and face: Denies mucositis or sore throat Respiratory: Denies cough, dyspnea or wheezes Cardiovascular: Denies palpitation, chest discomfort or lower extremity swelling Gastrointestinal:  Denies nausea, heartburn or change in bowel habits Skin: Denies abnormal skin rashes (+) Mild joint pain  Lymphatics: Denies new lymphadenopathy or easy bruising Neurological:Denies numbness, tingling or new weaknesses Behavioral/Psych: Mood is stable, no new changes  All other systems were reviewed with the patient and are negative.  MEDICAL HISTORY:  Past  Medical History:  Diagnosis Date  . 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 Breast cancer, right breast ([facility]---  oncolgoist-  dr fBurr Medico  Stage 1A (pT1, pN0, pM0)  Grade 2,  ER+, PR-, HER2 - ;  invasive ductal carcinoma s/p  bilateral mastectomies w/ sln bx's (negative) and completed chemotherapy (05-20-2016 to 09-29-2016)  . Eczema   . Family history of breast cancer   . Family history of genetic disease carrier    paternal relatives with a BRCA mutation  . Family history of ovarian cancer   . History of abnormal cervical Pap smear age [age]s . History of exercise intolerance 06/27/2006   normal  . HSV-2 infection   . Hypertension   . Prediabetes 05/26/2014  . Seasonal and perennial allergic rhinitis   . Wears contact lenses     SURGICAL HISTORY: Past Surgical History:  Procedure Laterality Date  . BREAST BIOPSY Right 04/13/2016   Procedure: Evacuation RIGHT Breast  Hematoma;  Surgeon: MRolm Bookbinder MD;  [facility]  Service: General;  Laterality: Right;  . COLONOSCOPY    . DILATION AND CURETTAGE OF UTERUS  2008  . LAPAROSCOPIC SALPINGO OOPHERECTOMY Bilateral 01/09/2017   Procedure: LAPAROSCOPIC SALPINGO OOPHORECTOMY with pelvic  washings;  Surgeon: [name], MD;  Location: [facility];  Service: Gynecology;  Laterality: Bilateral;  . MASTECTOMY W/ SENTINEL NODE BIOPSY Bilateral 04/12/2016   Procedure: BILATERAL TOTAL MASTECTOMIES  WITH RIGHT SENTINEL LYMPH NODE BIOPSY;  Surgeon: [name], MD;  Location: [facility];  Service: General;  Laterality: Bilateral;  . PORTACATH PLACEMENT Right 05/19/2016   Procedure: INSERTION PORT-A-CATH WITH [place];  Surgeon: [name], MD;  Location: [facility];  Service: General;  Laterality: Right;  . TRANSTHORACIC ECHOCARDIOGRAM  05/10/2016   ef 71-24%, grade 1 diastolic dyfuntion/  trivial TR    I have reviewed the social history and family history with the patient and  they are unchanged from previous note.  ALLERGIES:  has No Known Allergies.  MEDICATIONS:  Current Outpatient Medications  Medication Sig Dispense Refill  . acyclovir ointment (ZOVIRAX) 5 % Apply 1 application topically every 3 (three) hours. 15 g 1  . albuterol (VENTOLIN HFA) 108 (90 Base) MCG/ACT inhaler Inhale 2 puffs into the lungs every 6 (six) hours as needed for wheezing or shortness of breath. 18 g 1  . aspirin EC 81 MG tablet Take 81 mg by mouth daily.    . Calcium-Magnesium-Vitamin D (CALCIUM 1200+D3 PO) Take 1 tablet by mouth every evening. Taking just the Magnesium-Vitamin D    . cetirizine (ZYRTEC) 10 MG tablet Take 10 mg by mouth at bedtime.     . cloNIDine (CATAPRES) 0.2 MG tablet TAKE 1 TABLET BY MOUTH ONCE DAILY AT BEDTIME FOR BLOOD PRESSURE 90 tablet 1  . escitalopram (LEXAPRO) 20 MG tablet Take 1 tablet (20 mg total) by mouth daily. 90 tablet 3  . fluticasone (FLONASE ALLERGY RELIEF) 50 MCG/ACT nasal spray Place 1 spray into both nostrils every morning.     . gabapentin (NEURONTIN) 300 MG capsule Take 1-2 capsules (300-600 mg total) by mouth at bedtime. 90 capsule 1  . letrozole (FEMARA) 2.5 MG tablet TAKE 1 TABLET(2.5 MG) BY MOUTH EVERY MORNING 90 tablet 3  . losartan (COZAAR) 100 MG tablet Take 1 tablet by mouth once daily for blood pressure 90 tablet 1  . montelukast (SINGULAIR) 10 MG tablet Take 1 tablet Daily for Allergies 90 tablet 3  . triamcinolone cream (KENALOG) 0.1 % Apply 1 application topically 2 (two) times daily as needed (eczema). 453.6 g 1  . valACYclovir (VALTREX) 500 MG tablet Take 1 tablet (500 mg total) by mouth 2 (two) times daily. Take for 3 days as needed. 30 tablet 1  . venlafaxine XR (EFFEXOR-XR) 75 MG 24 hr capsule TAKE 2 CAPSULES BY MOUTH IN THE MORNING AND 1 NIGHTLY 270 capsule 1   No current facility-administered medications for this visit.    PHYSICAL EXAMINATION: ECOG PERFORMANCE STATUS: 0 - Asymptomatic  Vitals:   03/23/20 0949  BP:  (!) 148/94  Pulse: 77  Resp: 18  Temp: (!) 97.1 F (36.2 C)  SpO2: 100%   Filed Weights   03/23/20 0949  Weight: 147 lb 4.8 oz (66.8 kg)    GENERAL:alert, no distress and comfortable SKIN: skin color, texture, turgor are normal, no rashes or significant lesions EYES: normal, Conjunctiva are pink and non-injected, sclera clear  NECK: supple, thyroid normal size, non-tender, without nodularity LYMPH:  no palpable lymphadenopathy in the cervical, axillary  LUNGS: clear to auscultation and percussion with normal breathing effort HEART: regular rate & rhythm and no murmurs and no lower extremity edema ABDOMEN:abdomen soft, non-tender and normal bowel sounds Musculoskeletal:no cyanosis of digits  and no clubbing  NEURO: alert & oriented x 3 with fluent speech, no focal motor/sensory deficits BREAST: S/p b/l mastectomy with breasts surgically absent: Surgical incisions healed well. No palpable mass, nodules or adenopathy bilaterally. Breast exam benign.   LABORATORY DATA:  I have reviewed the data as listed CBC Latest Ref Rng & Units 03/23/2020 12/30/2019 06/27/2019  WBC 4.0 - 10.5 K/uL 5.8 4.6 5.4  Hemoglobin 12.0 - 15.0 g/dL 13.3 13.9 14.3  Hematocrit 36.0 - 46.0 % 39.6 40.6 43.5  Platelets 150 - 400 K/uL 214 213 229     CMP Latest Ref Rng & Units 03/23/2020 12/30/2019 06/27/2019  Glucose 70 - 99 mg/dL 103(H) 93 83  BUN 8 - 23 mg/dL 18 15 18   Creatinine 0.44 - 1.00 mg/dL 0.75 0.59 0.58  Sodium 135 - 145 mmol/L 140 144 142  Potassium 3.5 - 5.1 mmol/L 4.8 4.4 4.7  Chloride 98 - 111 mmol/L 106 103 103  CO2 22 - 32 mmol/L 27 30 28   Calcium 8.9 - 10.3 mg/dL 9.4 10.2 10.1  Total Protein 6.5 - 8.1 g/dL 6.9 7.0 7.0  Total Bilirubin 0.3 - 1.2 mg/dL 0.3 0.4 0.6  Alkaline Phos 38 - 126 U/L 104 - -  AST 15 - 41 U/L 18 18 20   ALT 0 - 44 U/L 16 14 20       RADIOGRAPHIC STUDIES: I have personally reviewed the radiological images as listed and agreed with the findings in the report. No  results found.   ASSESSMENT & PLAN:  Janal Haak is a 65 y.o. female with   1. 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 Oncotype RS 49, high risk -She was diagnosed in 02/2016. She is s/p b/l mastectomy and BSO due to her BRCA1+ mutation. She completed adjuvant chemo AC-T.  -She started antiestrogen withAnastrozole in 10/2016 and stopped after 2 weeks due to side effects. Switched to Letrozole 11/30/2016. She tolerated letrozole wellwith manageable hot flashes and joint pain. Plan forat least5-7years. -She is clinically doing well. Lab reviewed, her CBC and CMP are within normal limits. Her physical exam was unremarkable. There is no clinical concern for recurrence. -Continue Surveillance. Given she has had a b/l mastectomy there isno need for mammograms.  -Continue letrozole -Virtual visit with NP Lacie in 6 months and OV with me in 12 months.    2. Hot flashes, secondary to letrozole,insomnia -mainly at night and has interrupted her sleep but able to get enough hours of sleep -She has Effexor 48m and Gabapentin 3017m Meds managed by her PCP, She is also on Lexapro. I discussed she can increase Gabapentin dose if needed.    3. BRCA1 mutation (+) -We previously discussed the high risk of breast cancer and ovarian cancer due to the BRCA1 mutation. She is status post bilateral mastectomyand BSO. -Wepreviouslydiscussed slightly increased risk of pancreatic cancer, however she does not have any family history of pancreatic cancer, does not meet the criteria forroutinepancreatic cancer screening. -Her sister has done genetic testing, results were negative.She has no children.  -I also encouraged her to f/u with her Gyn or PCP to continue Pap Smearregularly.    4. Bone Health -Her 12/2016 DEXA was normal (-0.7 at right hip). Her 05/2019 was also normal  (-0.7 at right hip). Stable.  -Continue VitD and calcium daily    Plan: -Continue letrozole, I refilled today  -Virtual visit with NP Lacie in  6 months  -Lab and F/u with me in 1 year    No problem-specific Assessment & Plan notes found for this encounter.   No orders of the defined types were placed in this encounter.  All questions were answered. The patient knows to call the clinic with any problems, questions or concerns. No barriers to learning was detected. The total time spent in the appointment was 25 minutes.     Truitt Merle, MD 03/23/2020   I, Joslyn Devon, am acting as scribe for Truitt Merle, MD.   I have reviewed the above documentation for accuracy and completeness, and I agree with the above.

## 2020-03-23 ENCOUNTER — Encounter: Payer: Self-pay | Admitting: Hematology

## 2020-03-23 ENCOUNTER — Inpatient Hospital Stay: Payer: Medicare Other | Attending: Hematology

## 2020-03-23 ENCOUNTER — Telehealth: Payer: Self-pay | Admitting: Hematology

## 2020-03-23 ENCOUNTER — Other Ambulatory Visit: Payer: Self-pay

## 2020-03-23 ENCOUNTER — Inpatient Hospital Stay (HOSPITAL_BASED_OUTPATIENT_CLINIC_OR_DEPARTMENT_OTHER): Payer: Medicare Other | Admitting: Hematology

## 2020-03-23 VITALS — BP 148/94 | HR 77 | Temp 97.1°F | Resp 18 | Ht 66.5 in | Wt 147.3 lb

## 2020-03-23 DIAGNOSIS — Z79811 Long term (current) use of aromatase inhibitors: Secondary | ICD-10-CM | POA: Diagnosis not present

## 2020-03-23 DIAGNOSIS — I10 Essential (primary) hypertension: Secondary | ICD-10-CM

## 2020-03-23 DIAGNOSIS — C50111 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 female breast: Secondary | ICD-10-CM

## 2020-03-23 DIAGNOSIS — G4700 Insomnia, unspecified: Secondary | ICD-10-CM | POA: Diagnosis not present

## 2020-03-23 DIAGNOSIS — Z170 Estrogen receptor positive status [ER+]: Secondary | ICD-10-CM

## 2020-03-23 DIAGNOSIS — Z9013 Acquired absence of bilateral breasts and nipples: Secondary | ICD-10-CM | POA: Insufficient documentation

## 2020-03-23 DIAGNOSIS — N951 Menopausal and female climacteric states: Secondary | ICD-10-CM | POA: Insufficient documentation

## 2020-03-23 DIAGNOSIS — Z1509 Genetic susceptibility to other malignant neoplasm: Secondary | ICD-10-CM

## 2020-03-23 DIAGNOSIS — Z1501 Genetic susceptibility to malignant neoplasm of breast: Secondary | ICD-10-CM | POA: Diagnosis not present

## 2020-03-23 LAB — COMPREHENSIVE METABOLIC PANEL
ALT: 16 U/L (ref 0–44)
AST: 18 U/L (ref 15–41)
Albumin: 4.1 g/dL (ref 3.5–5.0)
Alkaline Phosphatase: 104 U/L (ref 38–126)
Anion gap: 7 (ref 5–15)
BUN: 18 mg/dL (ref 8–23)
CO2: 27 mmol/L (ref 22–32)
Calcium: 9.4 mg/dL (ref 8.9–10.3)
Chloride: 106 mmol/L (ref 98–111)
Creatinine, Ser: 0.75 mg/dL (ref 0.44–1.00)
GFR, Estimated: >60 mL/min (ref >60)
Glucose, Bld: 103 mg/dL — ABNORMAL HIGH (ref 70–99)
Potassium: 4.8 mmol/L (ref 3.5–5.1)
Sodium: 140 mmol/L (ref 135–145)
Total Bilirubin: 0.3 mg/dL (ref 0.3–1.2)
Total Protein: 6.9 g/dL (ref 6.5–8.1)

## 2020-03-23 LAB — CBC WITH DIFFERENTIAL/PLATELET
Abs Immature Granulocytes: 0.02 10*3/uL (ref 0.00–0.07)
Basophils Absolute: 0.1 10*3/uL (ref 0.0–0.1)
Basophils Relative: 1 %
Eosinophils Absolute: 0.1 10*3/uL (ref 0.0–0.5)
Eosinophils Relative: 2 %
HCT: 39.6 % (ref 36.0–46.0)
Hemoglobin: 13.3 g/dL (ref 12.0–15.0)
Immature Granulocytes: 0 %
Lymphocytes Relative: 36 %
Lymphs Abs: 2.1 10*3/uL (ref 0.7–4.0)
MCH: 33.5 pg (ref 26.0–34.0)
MCHC: 33.6 g/dL (ref 30.0–36.0)
MCV: 99.7 fL (ref 80.0–100.0)
Monocytes Absolute: 0.7 10*3/uL (ref 0.1–1.0)
Monocytes Relative: 12 %
Neutro Abs: 2.9 10*3/uL (ref 1.7–7.7)
Neutrophils Relative %: 49 %
Platelets: 214 10*3/uL (ref 150–400)
RBC: 3.97 MIL/uL (ref 3.87–5.11)
RDW: 12.7 % (ref 11.5–15.5)
WBC: 5.8 10*3/uL (ref 4.0–10.5)
nRBC: 0 % (ref 0.0–0.2)

## 2020-03-23 MED ORDER — LETROZOLE 2.5 MG PO TABS: ORAL_TABLET | ORAL | 3 refills | Status: DC

## 2020-03-23 NOTE — Telephone Encounter (Signed)
Scheduled follow-up appointments per 2/14 los. Gave option to call back to reschedule if needed.

## 2020-04-01 ENCOUNTER — Other Ambulatory Visit: Payer: Self-pay | Admitting: Adult Health

## 2020-04-01 MED ORDER — VALACYCLOVIR HCL 500 MG PO TABS: 500.0000 mg | ORAL_TABLET | Freq: Two times a day (BID) | ORAL | 1 refills | Status: DC

## 2020-04-02 ENCOUNTER — Telehealth: Payer: Self-pay | Admitting: Nurse Practitioner

## 2020-04-02 NOTE — Telephone Encounter (Signed)
Left message with moved upcoming appointment due to provider's template. Gave option to call back to reschedule if needed. 

## 2020-06-14 ENCOUNTER — Encounter: Payer: Self-pay | Admitting: Nurse Practitioner

## 2020-06-15 ENCOUNTER — Other Ambulatory Visit: Payer: Self-pay | Admitting: Adult Health

## 2020-06-15 DIAGNOSIS — R232 Flushing: Secondary | ICD-10-CM

## 2020-06-26 NOTE — Progress Notes (Signed)
CPE  Assessment and Plan:  Encounter for Annual Physical Exam with abnormal findings Due annually  She states will schedule PAP with GYN  Essential hypertension - continue medications - start keeping log at home, rotating checks, recheck in 2 weeks by [place] DASH diet, exercise and monitor at home. Call if greater than 130/80.  -     CBC with Differential/Platelet -     CMP/GFR -     TSH -      UA/microalbumin -      Magnesium -      EKG  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 breast in female, estrogen receptor positive ([place]) Continue follow up with oncology  BRCA1 positive Continue follow up with oncology   Vitamin D deficiency Continue supplement   Allergic state, subsequent encounter -     montelukast (SINGULAIR) 10 MG tablet; TAKE 1 TABLET(10 MG) BY MOUTH DAILY  Uncomplicated asthma, unspecified asthma severity, unspecified whether persistent -     montelukast (SINGULAIR) 10 MG tablet; TAKE 1 TABLET(10 MG) BY MOUTH DAILY  Eczema, unspecified type -     triamcinolone PRN  Abnormal glucose (hx of prediabetes) Recent A1Cs at goal Discussed diet/exercise, weight management  Defer A1C; check CMP  Hyperlipidemia Continue low cholesterol diet and exercise.  Check lipid panel.  -     Lipid panel  Medication management -     Magnesium  Hot flashes -  Continue medications  BMI 24 Continue to recommend diet heavy in fruits and veggies and low in animal meats, cheeses, and dairy products, appropriate calorie intake Discuss exercise recommendations routinely Continue to monitor weight at each visit  Excessive drinking of alcohol Recommended reduce intake, ideally avoid daily, limit to 1 drink/occasion or <4/week Ambivalent; continue to monitor and educate  Former smoker - CXR  Orders Placed This Encounter  Procedures  . DG Chest 2 View  . CBC with Differential/Platelet  . COMPLETE METABOLIC PANEL WITH GFR  . Magnesium  . Lipid panel  . TSH  .  Hemoglobin A1c  . Microalbumin / creatinine urine ratio  . Urinalysis, Routine w reflex microscopic  . EKG 12-Lead    Discussed med's effects and SE's. Screening labs and tests as requested with regular follow-up as recommended. Future Appointments  Date Time Provider [place]  09/17/2020 10:30 AM Alla Feeling, NP [facility] None  12/29/2020 11:00 AM Liane Comber, NP [facility] None  03/19/2021  1:00 PM [facility] None  03/19/2021  1:40 PM Truitt Merle, MD [facility] None  06/29/2021 10:00 AM Liane Comber, NP [facility] None    HPI 65 y.o. female  presents for CPE She has Hypertension; Asthma; Allergy; Eczema; Mixed hyperlipidemia; Other abnormal glucose (Hx of prediabetes); Vitamin D deficiency; Hot flashes; 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 breast in female, estrogen receptor positive ([place]); BRCA1 positive; Genetic testing; Breast cancer, right ([place]); Port-A-Cath in place; and Scar tissue on their problem list.  She is married, 26 years, no kids. She is retired since 2018, various jobs. Keeps busy at home and enjoys her retirement.   She no longer follows with Dr. Quincy Simmonds, GYN, had salpingooopherectomy, no recent abnormal PAP and was advised none further needed. Dr. Burr Medico recomemnded she resume PAP.   She has atopy with eczema, allergies, and asthma. She is on flonase for allergies, needs refill albuterol, has not been using it.   She was diagnosed with right breast cancer ER +, PR+ HER2 - 02/2016, following with Dr. Burr Medico, she  is on letrozole, had double mastectomy with [name] 04/12/2016, BRCA 1+, had BSP dec 2018.  Just saw him 03/2020 with normal manual exam.   She is on lexapro, effexor (has been on both and done well for many years), gabapentin and catapress for hot flashes with letrozole that help.   BMI is Body mass index is 24.03 kg/m., she has been working on diet and exercise. She walks 3 miles a day most days of the week[facility], doing HIIT, zumba, yoga.  She does drink 2 glasses of wine every evening, ambivalent about reducing.  Wt Readings from Last 3 Encounters:  06/29/20 144 lb 6.4 oz (65.5 kg)  03/23/20 147 lb 4.8 oz (66.8 kg)  12/30/19 144 lb (65.3 kg)   She has not been checking BP at home, today their BP is BP: (!) 140/92, similar by manual recheck historically labile but improved since retiring, takes losartan 100 mg  She does workout, she is walking, she is riding horses  She denies chest pain, shortness of breath, dizziness.   She is not on cholesterol medication and denies myalgias. Her cholesterol is not at goal, atypical elevation at last visit. Has started benefiber. The cholesterol last visit was:   Lab Results  Component Value Date   CHOL 226 (H) 12/30/2019   HDL 94 12/30/2019   LDLCALC 107 (H) 12/30/2019   TRIG 136 12/30/2019   CHOLHDL 2.4 12/30/2019   She has been working on diet/exercise for glucose management, hx of prediabetes (6.7% in 2016). Last A1C in the office was:  Lab Results  Component Value Date   HGBA1C 5.2 05/29/2018    Last GFR:  Lab Results  Component Value Date   GFRNONAA >60 03/23/2020   Patient is on Vitamin D supplement, one a day, unsure of dosage, takes 2 caps Lab Results  Component Value Date   VD25OH 45 06/27/2019      Current Medications:  Current Outpatient Medications on File Prior to Visit  Medication Sig Dispense Refill  . acyclovir ointment (ZOVIRAX) 5 % Apply 1 application topically every 3 (three) hours. 15 g 1  . albuterol (VENTOLIN HFA) 108 (90 Base) MCG/ACT inhaler Inhale 2 puffs into the lungs every 6 (six) hours as needed for wheezing or shortness of breath. 18 g 1  . aspirin EC 81 MG tablet Take 81 mg by mouth daily.    . cetirizine (ZYRTEC) 10 MG tablet Take 10 mg by mouth at bedtime.     . cloNIDine (CATAPRES) 0.2 MG tablet Take  1 tablet  Daily  for BP 90 tablet 3  . escitalopram (LEXAPRO) 20 MG tablet Take 1 tablet (20 mg  total) by mouth daily. 90 tablet 3  . fluticasone (FLONASE) 50 MCG/ACT nasal spray Place 1 spray into both nostrils every morning.     . gabapentin (NEURONTIN) 300 MG capsule TAKE 1 TO 2 CAPSULES BY MOUTH AT BEDTIME 90 capsule 0  . letrozole (FEMARA) 2.5 MG tablet TAKE 1 TABLET(2.5 MG) BY MOUTH EVERY MORNING 90 tablet 3  . losartan (COZAAR) 100 MG tablet Take 1 tablet by mouth once daily for blood pressure 90 tablet 1  . MAGNESIUM PO Take by mouth daily.    . montelukast (SINGULAIR) 10 MG tablet Take 1 tablet Daily for Allergies 90 tablet 3  . Multiple Vitamins-Minerals (ZINC PO) Take by mouth daily.    Marland Kitchen triamcinolone cream (KENALOG) 0.1 % Apply 1 application topically 2 (two) times daily as needed (  eczema). 453.6 g 1  . valACYclovir (VALTREX) 500 MG tablet Take 1 tablet (500 mg total) by mouth 2 (two) times daily. Take for 3 days as needed. 30 tablet 1  . venlafaxine XR (EFFEXOR-XR) 75 MG 24 hr capsule TAKE 2 CAPSULES BY MOUTH IN THE MORNING AND 1 NIGHTLY 270 capsule 1  . VITAMIN D, CHOLECALCIFEROL, PO Take by mouth daily.    Marland Kitchen VITAMIN E PO Take by mouth daily.    . Calcium-Magnesium-Vitamin D (CALCIUM 1200+D3 PO) Take 1 tablet by mouth every evening. Taking just the Magnesium-Vitamin D     No current facility-administered medications on file prior to visit.   Health Maintenance:   Immunization History  Administered Date(s) Administered  . Influenza Inj Mdck Quad Pf 11/01/2018  . Influenza Inj Mdck Quad With Preservative 11/22/2016, 11/23/2017  . Influenza, High Dose Seasonal PF 12/30/2019  . Influenza, Seasonal, Injecte, Preservative Fre 11/16/2015  . Moderna Sars-Covid-2 Vaccination 04/08/2019, 12/24/2019  . Pneumococcal Conjugate-13 12/17/2018  . Pneumococcal Polysaccharide-23 11/13/2014, 12/30/2019  . Td 02/08/1996, 02/07/2006  . Tdap 04/27/2017  . Zoster Recombinat (Shingrix) 11/01/2018    Tetanus: 2019  Pneumovax: 12/2019 Prevnar 13: 2020 Flu vaccine: 12/2019 shingrix:  2020 but never got 2nd one due to COVID vaccines- plans to restart seriers COVID: completed will message dates, Levan Hurst, 3/3   Pap: 04/2016 Dr. Quincy Simmonds- will call to recschedule  MGM: 02/2016 s/p mastectomy, Dr. Annamaria Boots doing manual exams  DEXA: 05/2019 NORMAL  Colonoscopy: Nov 2017 Dr. Watt Climes 10 years EGD: N/A  Last eye: Dr. [place], 06/2020, glasses Last dental: Dr. Laurance Flatten, 2022, q71m Patient Care Team: MUnk [name] MD as PCP - General (Internal Medicine) CDelice Bison LCharlestine Massed NP as Nurse Practitioner (Hematology and Oncology) FTruitt Merle MD as Consulting Physician (Hematology) WRolm Bookbinder MD as Consulting Physician (General Surgery)   Medical History:  Past Medical History:  Diagnosis Date  . 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 Breast cancer, right breast ([place] [facility] 03/[place]---  oncolgoist-  dr fBurr Medico  Stage 1A (pT1, pN0, pM0)  Grade 2,  ER+, PR-, HER2 - ;  invasive ductal carcinoma s/p  bilateral mastectomies w/ sln bx's (negative) and completed chemotherapy (05-20-2016 to 09-29-2016)  . Eczema   . Family history of breast cancer   . Family history of genetic disease carrier    paternal relatives with a BRCA mutation  . Family history of ovarian cancer   . History of abnormal cervical Pap smear age [age]s . History of exercise intolerance 06/27/2006   normal  . HSV-2 infection   . Hypertension   . Prediabetes 05/26/2014  . Seasonal and perennial allergic rhinitis   . Wears contact lenses    Allergies No Known Allergies  SURGICAL HISTORY She  has a past surgical history that includes Dilation and curettage of uterus (2008); Mastectomy w/ sentinel node biopsy (Bilateral, 04/12/2016); Breast biopsy (Right, 04/13/2016); Colonoscopy; Portacath placement (Right, 05/19/2016); transthoracic echocardiogram (05/10/2016); and Laparoscopic salpingo oophorectomy (Bilateral, 01/09/2017). FAMILY HISTORY Her family history includes Atrial fibrillation in  her mother; Breast cancer in her cousin; Heart attack in her father; Heart disease in her father; Hypertension in her father; Ovarian cancer in her cousin, cousin, paternal aunt, paternal aunt, and another family member; Polymyalgia rheumatica in her mother; Prostate cancer in her father. SOCIAL HISTORY She  reports that she quit smoking about 24 years ago. She has a 25.00 pack-year smoking history. She has never used smokeless tobacco. She reports  current alcohol use of about 14.0 standard drinks of alcohol per week. She reports that she does not use drugs.  Depression/mood screen:   Depression screen [facility] 2/9 06/29/2020  Decreased Interest 0  Down, Depressed, Hopeless 0  PHQ - 2 Score 0      Review of Systems  Constitutional: Positive for diaphoresis (unchanged hot flashes/night sweats). Negative for malaise/fatigue and weight loss.  HENT: Negative for hearing loss and tinnitus.   Eyes: Negative for blurred vision and double vision.  Respiratory: Negative for cough, sputum production, shortness of breath and wheezing.   Cardiovascular: Negative for chest pain, palpitations, orthopnea, claudication, leg swelling and PND.  Gastrointestinal: Negative for abdominal pain, blood in stool, constipation, diarrhea, heartburn, melena, nausea and vomiting.  Genitourinary: Negative.   Musculoskeletal: Negative for falls, joint pain and myalgias.  Skin: Negative for rash.  Neurological: Negative for dizziness, tingling, sensory change, weakness and headaches.  Endo/Heme/Allergies: Positive for environmental allergies (well controlled with meds). Negative for polydipsia.  Psychiatric/Behavioral: Negative.  Negative for depression, memory loss, substance abuse and suicidal ideas. The patient is not nervous/anxious and does not have insomnia.   All other systems reviewed and are negative.    Physical Exam: Estimated body mass index is 24.03 kg/m as calculated from the following:   Height as of this  encounter: 5' 5"  (1.651 m).   Weight as of this encounter: 144 lb 6.4 oz (65.5 kg). BP (!) 140/92   Pulse 75   Temp (!) 97 F (36.1 C)   Ht 5' 5"  (1.651 m)   Wt 144 lb 6.4 oz (65.5 kg)   LMP 02/07/1998 (Approximate)   SpO2 98%   BMI 24.03 kg/m  General Appearance: Well nourished, in no apparent distress. Eyes: PERRLA, EOMs, conjunctiva no swelling or erythema Sinuses: No Frontal/maxillary tenderness ENT/Mouth: Ext aud canals clear, normal light reflex with TMs without erythema, bulging.  Good dentition. No erythema, swelling, or exudate on post pharynx. Tonsils not swollen or erythematous. Hearing normal.  Neck: Supple, thyroid normal. No bruits Respiratory: Respiratory effort normal, BS equal bilaterally without rales, rhonchi, wheezing or stridor. Cardio: RRR without murmurs, rubs or gallops. Brisk peripheral pulses without edema.  Chest: symmetric, with normal excursions and percussion. Breasts: defer, just had normal exam by Dr. Annamaria Boots Abdomen: Soft, +BS. Non tender, no guarding, rebound, hernias, masses, or organomegaly. .  Lymphatics: Non tender without lymphadenopathy.  Genitourinary: defer to GYN - she states will schedule Musculoskeletal: Full ROM all peripheral extremities,5/5 strength, and normal gait. Skin:  Warm, dry without lesions, ecchymosis.  Neuro: Cranial nerves intact, reflexes equal bilaterally. Normal muscle tone, no cerebellar symptoms. Sensation intact.  Psych: Awake and oriented X 3, normal affect, Insight and Judgment appropriate.   EKG: NSR  The patient's weight, height, BMI, and visual acuity have been recorded in the chart.  I have made referrals, counseling, and provided education to the patient based on review of the above and I have provided the patient with a written personalized care plan for preventive services.    Izora Ribas, NP 10:45 AM [facility]

## 2020-06-28 ENCOUNTER — Other Ambulatory Visit: Payer: Self-pay | Admitting: Nurse Practitioner

## 2020-06-29 ENCOUNTER — Ambulatory Visit (INDEPENDENT_AMBULATORY_CARE_PROVIDER_SITE_OTHER): Payer: Medicare Other | Admitting: Adult Health

## 2020-06-29 ENCOUNTER — Other Ambulatory Visit: Payer: Self-pay

## 2020-06-29 ENCOUNTER — Encounter: Payer: Self-pay | Admitting: Adult Health

## 2020-06-29 ENCOUNTER — Encounter: Payer: Self-pay | Admitting: Hematology

## 2020-06-29 VITALS — BP 140/92 | HR 75 | Temp 97.0°F | Ht 65.0 in | Wt 144.4 lb

## 2020-06-29 DIAGNOSIS — Z136 Encounter for screening for cardiovascular disorders: Secondary | ICD-10-CM

## 2020-06-29 DIAGNOSIS — Z6824 Body mass index (BMI) 24.0-24.9, adult: Secondary | ICD-10-CM

## 2020-06-29 DIAGNOSIS — Z131 Encounter for screening for diabetes mellitus: Secondary | ICD-10-CM

## 2020-06-29 DIAGNOSIS — R7309 Other abnormal glucose: Secondary | ICD-10-CM

## 2020-06-29 DIAGNOSIS — F1010 Alcohol abuse, uncomplicated: Secondary | ICD-10-CM

## 2020-06-29 DIAGNOSIS — C50911 Malignant neoplasm of unspecified site of right female breast: Secondary | ICD-10-CM

## 2020-06-29 DIAGNOSIS — Z0000 Encounter for general adult medical examination without abnormal findings: Secondary | ICD-10-CM

## 2020-06-29 DIAGNOSIS — J45909 Unspecified asthma, uncomplicated: Secondary | ICD-10-CM | POA: Diagnosis not present

## 2020-06-29 DIAGNOSIS — R232 Flushing: Secondary | ICD-10-CM

## 2020-06-29 DIAGNOSIS — Z1389 Encounter for screening for other disorder: Secondary | ICD-10-CM

## 2020-06-29 DIAGNOSIS — L309 Dermatitis, unspecified: Secondary | ICD-10-CM

## 2020-06-29 DIAGNOSIS — Z87891 Personal history of nicotine dependence: Secondary | ICD-10-CM | POA: Insufficient documentation

## 2020-06-29 DIAGNOSIS — I10 Essential (primary) hypertension: Secondary | ICD-10-CM

## 2020-06-29 DIAGNOSIS — T7840XD Allergy, unspecified, subsequent encounter: Secondary | ICD-10-CM

## 2020-06-29 DIAGNOSIS — Z6823 Body mass index (BMI) 23.0-23.9, adult: Secondary | ICD-10-CM

## 2020-06-29 DIAGNOSIS — E782 Mixed hyperlipidemia: Secondary | ICD-10-CM

## 2020-06-29 DIAGNOSIS — E559 Vitamin D deficiency, unspecified: Secondary | ICD-10-CM

## 2020-06-29 DIAGNOSIS — Z1329 Encounter for screening for other suspected endocrine disorder: Secondary | ICD-10-CM

## 2020-06-29 NOTE — Patient Instructions (Addendum)
Chelsea Salinas , Thank you for taking time to come for your Annual Wellness Visit. I appreciate your ongoing commitment to your health goals. Please review the following plan we discussed and let me know if I can assist you in the future.   These are the goals we discussed: Goals    . Blood Pressure < 130/80    . LDL CALC < 130    . Reduce alcohol intake     Ideally avoid daily drinking, limit to <2/day       This is a list of the screening recommended for you and due dates:  Health Maintenance  Topic Date Due  . COVID-19 Vaccine (3 - Moderna risk 4-dose series) 01/21/2020  . Flu Shot  09/07/2020  . Colon Cancer Screening  12/22/2025  . Tetanus Vaccine  04/28/2027  . DEXA scan (bone density measurement)  Completed  . Hepatitis C Screening: USPSTF Recommendation to screen - Ages 29-79 yo.  Completed  . Pneumonia vaccines  Completed  . HPV Vaccine  Aged Out     Know what a healthy weight is for you (roughly BMI <25) and aim to maintain this  Aim for 7+ servings of fruits and vegetables daily  65-80+ fluid ounces of water or unsweet tea for healthy kidneys  Limit to max 1 drink of alcohol per day for women; avoid smoking/tobacco  Limit animal fats in diet for cholesterol and heart health - choose grass fed whenever available  Avoid highly processed foods, and foods high in saturated/trans fats  Aim for low stress - take time to unwind and care for your mental health  Aim for 150 min of moderate intensity exercise weekly for heart health, and weights twice weekly for bone health  Aim for 7-9 hours of sleep daily     HYPERTENSION INFORMATION  Monitor your blood pressure at home, please keep a record and bring that in with you to your next office visit.   Go to the ER if any CP, SOB, nausea, dizziness, severe HA, changes vision/speech  Testing/Procedures: HOW TO TAKE YOUR BLOOD PRESSURE:  Rest 5 minutes before taking your blood pressure.  Don't smoke or drink  caffeinated beverages for at least 30 minutes before.  Take your blood pressure before (not after) you eat.  Sit comfortably with your back supported and both feet on the floor (don't cross your legs).  Elevate your arm to heart level on a table or a desk.  Use the proper sized cuff. It should fit smoothly and snugly around your bare upper arm. There should be enough room to slip a fingertip under the cuff. The bottom edge of the cuff should be 1 inch above the crease of the elbow.  Due to a recent study, SPRINT, we have changed our goal for the systolic or top blood pressure number. Ideally we want your top number at 120.  In the [facility] Trial, 5000 people were randomized to a goal BP of 120 and 5000 people were randomized to a goal BP of less than 140. The patients with the goal BP at 120 had LESS DEMENTIA, LESS HEART ATTACKS, AND LESS STROKES, AS WELL AS OVERALL DECREASED MORTALITY OR DEATH RATE.   There was another study that showed taking your blood pressure medications at night decrease cardiovascular events.  However if you are on a fluid pill, please take this in the morning.   If you are willing, our goal BP is the top number of 120.  Your most  recent BP: BP: (!) 140/92   Take your medications faithfully as instructed. Maintain a healthy weight. Get at least 150 minutes of aerobic exercise per week. Minimize salt intake. Minimize alcohol intake  DASH Eating Plan DASH stands for "Dietary Approaches to Stop Hypertension." The DASH eating plan is a healthy eating plan that has been shown to reduce high blood pressure (hypertension). Additional health benefits may include reducing the risk of type 2 diabetes mellitus, heart disease, and stroke. The DASH eating plan may also help with weight loss. WHAT DO I NEED TO KNOW ABOUT THE DASH EATING PLAN? For the DASH eating plan, you will follow these general guidelines:  Choose foods with a percent daily value for sodium of less than 5%  (as listed on the food label).  Use salt-free seasonings or herbs instead of table salt or sea salt.  Check with your health care provider or pharmacist before using salt substitutes.  Eat lower-sodium products, often labeled as "lower sodium" or "no salt added."  Eat fresh foods.  Eat more vegetables, fruits, and low-fat dairy products.  Choose whole grains. Look for the word "whole" as the first word in the ingredient list.  Choose fish and skinless chicken or [place] more often than red meat. Limit fish, poultry, and meat to 6 oz (170 g) each day.  Limit sweets, desserts, sugars, and sugary drinks.  Choose heart-healthy fats.  Limit cheese to 1 oz (28 g) per day.  Eat more home-cooked food and less restaurant, buffet, and fast food.  Limit fried foods.  Cook foods using methods other than frying.  Limit canned vegetables. If you do use them, rinse them well to decrease the sodium.  When eating at a restaurant, ask that your food be prepared with less salt, or no salt if possible. WHAT FOODS CAN I EAT? Seek help from a dietitian for individual calorie needs. Grains Whole grain or whole wheat bread. Brown rice. Whole grain or whole wheat pasta. Quinoa, bulgur, and whole grain cereals. Low-sodium cereals. Corn or whole wheat flour tortillas. Whole grain cornbread. Whole grain crackers. Low-sodium crackers. Vegetables Fresh or frozen vegetables (raw, steamed, roasted, or grilled). Low-sodium or reduced-sodium tomato and vegetable juices. Low-sodium or reduced-sodium tomato sauce and paste. Low-sodium or reduced-sodium canned vegetables.  Fruits All fresh, canned (in natural juice), or frozen fruits. Meat and Other Protein Products Ground beef (85% or leaner), grass-fed beef, or beef trimmed of fat. Skinless chicken or [place]. Ground chicken or [place]. Pork trimmed of fat. All fish and seafood. Eggs. Dried beans, peas, or lentils. Unsalted nuts and seeds. Unsalted canned  beans. Dairy Low-fat dairy products, such as skim or 1% milk, 2% or reduced-fat cheeses, low-fat ricotta or cottage cheese, or plain low-fat yogurt. Low-sodium or reduced-sodium cheeses. Fats and Oils Tub margarines without trans fats. Light or reduced-fat mayonnaise and salad dressings (reduced sodium). Avocado. Safflower, olive, or canola oils. Natural peanut or almond butter. Other Unsalted popcorn and pretzels. The items listed above may not be a complete list of recommended foods or beverages. Contact your dietitian for more options. WHAT FOODS ARE NOT RECOMMENDED? Grains White bread. White pasta. White rice. Refined cornbread. Bagels and croissants. Crackers that contain trans fat. Vegetables Creamed or fried vegetables. Vegetables in a cheese sauce. Regular canned vegetables. Regular canned tomato sauce and paste. Regular tomato and vegetable juices. Fruits Dried fruits. Canned fruit in light or heavy syrup. Fruit juice. Meat and Other Protein Products Fatty cuts of meat. Ribs, chicken wings, bacon,  sausage, bologna, salami, chitterlings, fatback, hot dogs, bratwurst, and packaged luncheon meats. Salted nuts and seeds. Canned beans with salt. Dairy Whole or 2% milk, cream, half-and-half, and cream cheese. Whole-fat or sweetened yogurt. Full-fat cheeses or blue cheese. Nondairy creamers and whipped toppings. Processed cheese, cheese spreads, or cheese curds. Condiments Onion and garlic salt, seasoned salt, table salt, and sea salt. Canned and packaged gravies. Worcestershire sauce. Tartar sauce. Barbecue sauce. Teriyaki sauce. Soy sauce, including reduced sodium. Steak sauce. Fish sauce. Oyster sauce. Cocktail sauce. Horseradish. Ketchup and mustard. Meat flavorings and tenderizers. Bouillon cubes. Hot sauce. Tabasco sauce. Marinades. Taco seasonings. Relishes. Fats and Oils Butter, stick margarine, lard, shortening, ghee, and bacon fat. Coconut, palm kernel, or palm oils. Regular salad  dressings. Other Pickles and olives. Salted popcorn and pretzels. The items listed above may not be a complete list of foods and beverages to avoid. Contact your dietitian for more information. WHERE CAN I FIND MORE INFORMATION? National Heart, Lung, and Blood Institute: travelstabloid.com Document Released: 01/13/2011 Document Revised: 06/10/2013 Document Reviewed: 11/28/2012 [facility] Patient Information 2015 [place], [place]. This information is not intended to replace advice given to you by your health care provider. Make sure you discuss any questions you have with your health care provider.

## 2020-06-30 LAB — COMPLETE METABOLIC PANEL WITH GFR
AG Ratio: 1.8 (calc) (ref 1.0–2.5)
ALT: 12 U/L (ref 6–29)
AST: 15 U/L (ref 10–35)
Albumin: 4.3 g/dL (ref 3.6–5.1)
Alkaline phosphatase (APISO): 79 U/L (ref 37–153)
BUN: 18 mg/dL (ref 7–25)
CO2: 31 mmol/L (ref 20–32)
Calcium: 9.7 mg/dL (ref 8.6–10.4)
Chloride: 105 mmol/L (ref 98–110)
Creat: 0.75 mg/dL (ref 0.50–0.99)
GFR, Est African American: 96 mL/min/{1.73_m2} (ref >=60)
GFR, Est Non African American: 83 mL/min/{1.73_m2} (ref >=60)
Globulin: 2.4 g/dL (calc) (ref 1.9–3.7)
Glucose, Bld: 87 mg/dL (ref 65–99)
Potassium: 4.8 mmol/L (ref 3.5–5.3)
Sodium: 142 mmol/L (ref 135–146)
Total Bilirubin: 0.4 mg/dL (ref 0.2–1.2)
Total Protein: 6.7 g/dL (ref 6.1–8.1)

## 2020-06-30 LAB — CBC WITH DIFFERENTIAL/PLATELET
Absolute Monocytes: 518 cells/uL (ref 200–950)
Basophils Absolute: 41 cells/uL (ref 0–200)
Basophils Relative: 0.9 %
Eosinophils Absolute: 81 cells/uL (ref 15–500)
Eosinophils Relative: 1.8 %
HCT: 38.4 % (ref 35.0–45.0)
Hemoglobin: 12.5 g/dL (ref 11.7–15.5)
Lymphs Abs: 1476 cells/uL (ref 850–3900)
MCH: 32 pg (ref 27.0–33.0)
MCHC: 32.6 g/dL (ref 32.0–36.0)
MCV: 98.2 fL (ref 80.0–100.0)
MPV: 10.3 fL (ref 7.5–12.5)
Monocytes Relative: 11.5 %
Neutro Abs: 2385 cells/uL (ref 1500–7800)
Neutrophils Relative %: 53 %
Platelets: 217 10*3/uL (ref 140–400)
RBC: 3.91 10*6/uL (ref 3.80–5.10)
RDW: 12.4 % (ref 11.0–15.0)
Total Lymphocyte: 32.8 %
WBC: 4.5 10*3/uL (ref 3.8–10.8)

## 2020-06-30 LAB — LIPID PANEL
Cholesterol: 212 mg/dL — ABNORMAL HIGH (ref <200)
HDL: 85 mg/dL (ref >=50)
LDL Cholesterol (Calc): 106 mg/dL (calc) — ABNORMAL HIGH
Non-HDL Cholesterol (Calc): 127 mg/dL (calc) (ref <130)
Total CHOL/HDL Ratio: 2.5 (calc) (ref <5.0)
Triglycerides: 116 mg/dL (ref <150)

## 2020-06-30 LAB — URINALYSIS, ROUTINE W REFLEX MICROSCOPIC
Bilirubin Urine: NEGATIVE
Glucose, UA: NEGATIVE
Hgb urine dipstick: NEGATIVE
Ketones, ur: NEGATIVE
Leukocytes,Ua: NEGATIVE
Nitrite: NEGATIVE
Protein, ur: NEGATIVE
Specific Gravity, Urine: 1.014 (ref 1.001–1.035)
pH: 6 (ref 5.0–8.0)

## 2020-06-30 LAB — MICROALBUMIN / CREATININE URINE RATIO
Creatinine, Urine: 61 mg/dL (ref 20–275)
Microalb Creat Ratio: 5 mcg/mg creat (ref <30)
Microalb, Ur: 0.3 mg/dL

## 2020-06-30 LAB — HEMOGLOBIN A1C
Hgb A1c MFr Bld: 5.3 % of total Hgb (ref <5.7)
Mean Plasma Glucose: 105 mg/dL
eAG (mmol/L): 5.8 mmol/L

## 2020-06-30 LAB — MAGNESIUM: Magnesium: 1.8 mg/dL (ref 1.5–2.5)

## 2020-06-30 LAB — TSH: TSH: 2.12 mIU/L (ref 0.40–4.50)

## 2020-07-09 ENCOUNTER — Other Ambulatory Visit: Payer: Self-pay | Admitting: Adult Health

## 2020-07-13 ENCOUNTER — Ambulatory Visit: Payer: Medicare Other

## 2020-08-07 ENCOUNTER — Encounter: Payer: Self-pay | Admitting: Hematology

## 2020-08-11 ENCOUNTER — Other Ambulatory Visit: Payer: Self-pay | Admitting: Hematology

## 2020-09-17 ENCOUNTER — Encounter: Payer: Self-pay | Admitting: Nurse Practitioner

## 2020-09-17 ENCOUNTER — Inpatient Hospital Stay: Payer: Medicare Other | Attending: Hematology | Admitting: Nurse Practitioner

## 2020-09-17 DIAGNOSIS — Z170 Estrogen receptor positive status [ER+]: Secondary | ICD-10-CM

## 2020-09-17 DIAGNOSIS — R232 Flushing: Secondary | ICD-10-CM | POA: Diagnosis not present

## 2020-09-17 DIAGNOSIS — C50111 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 female breast: Secondary | ICD-10-CM

## 2020-09-17 MED ORDER — GABAPENTIN 300 MG PO CAPS: ORAL_CAPSULE | ORAL | 1 refills | Status: DC

## 2020-09-17 MED ORDER — LETROZOLE 2.5 MG PO TABS: ORAL_TABLET | ORAL | 3 refills | Status: DC

## 2020-09-17 MED ORDER — CLONIDINE HCL 0.2 MG PO TABS
ORAL_TABLET | ORAL | 3 refills | Status: DC
Start: 2020-09-17 — End: 2021-03-19

## 2020-09-17 NOTE — Progress Notes (Signed)
[name]   [phone] [phone]   Clinic Follow up Note   Patient Care Team: [name], MD as PCP - General (Internal Medicine) [name], [name], NP as Nurse Practitioner (Hematology and Oncology) Truitt Merle, MD as Consulting Physician (Hematology) [name], MD as Consulting Physician (General Surgery) 09/17/2020  I connected with Chelsea Salinas on 09/17/20 at 10:30 AM EDT by telephone visit and verified that I am speaking with the correct person using two identifiers.   I discussed the limitations, risks, security and privacy concerns of performing an evaluation and management service by telemedicine and the availability of in-person appointments. I also discussed with the patient that there may be a patient responsible charge related to this service. The patient expressed understanding and agreed to proceed.   Other persons participating in the visit and their role in the encounter: None   Patient's location: Home Provider's location: Home office   Chief Complaint: routine f/up right breast cancer   SUMMARY OF ONCOLOGIC HISTORY: Oncology History Overview Note  Cancer Staging 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 breast in female, estrogen receptor positive ([place]) Staging form: Breast, AJCC 8th Edition - Clinical stage from 03/08/2016: Stage IA (cT1c, cN0, cM0, G2, ER: Positive, PR: Negative, HER2: Negative) - Signed by Truitt Merle, MD on 03/15/2016 - Pathologic stage from 04/12/2016: Stage IA (pT1c, pN0, cM0, G2, ER: Positive, PR: Negative, HER2: Negative, Oncotype DX score: 49) - Signed by Truitt Merle, MD on 05/05/2016     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 breast in female, estrogen receptor positive ([place])  03/04/2016 Mammogram   Diagnostic bilateral mammogram and right breast ultrasound showed a 1.7 cm mass in the 12:00 position of the right breast, highly suspicious for malignancy. There is a  borderline abnormal right axillary lymph node, also suspicious for metastasis.    03/08/2016 Initial Diagnosis   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 breast in female, estrogen receptor positive ([place])   03/08/2016 Initial Biopsy   Right breast 12:00 core needle biopsy showed invasive ductal carcinoma, grade 2, right axillary lymph node biopsy was negative.   03/08/2016 Receptors her2   ER 20% positive, PR negative, HER-2 negative, Ki-67 20%   03/30/2016 Imaging   MR Bilateral Breast 03/30/2016 IMPRESSION: Known unifocal right breast malignancy. No MRI evidence of malignancy in the left breast.   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  Genes Analyzed: 43 genes on [facility]'s Common Cancers panel (APC, ATM, AXIN2, BARD1, BMPR1A, BRCA1, BRCA2, BRIP1, CDH1, CDKN2A, CHEK2, DICER1, EPCAM, GREM1, HOXB13, KIT, MEN1, MLH1, MSH2, MSH6, MUTYH, NBN, NF1, PALB2, PDGFRA, PMS2, POLD1, POLE, PTEN, RAD50, RAD51C, RAD51D, SDHA, SDHB, SDHC, SDHD, SMAD4, SMARCA4, STK11, TP53, TSC1, TSC2, VHL).    04/12/2016 Surgery   Bilateral mastectomy and right sentinel lymph node biopsy, by [name]   04/12/2016 Pathology Results   Right breast mastectomy showed invasive grade 2 invasive ductal carcinoma, 1.7 cm, margins were negative, 3 sentinel lymph nodes were negative, left simple mastectomy fibroadenoma, one benign node, no atypia or tumor.    05/04/2016 Oncotype testing   Recurrence score 49, high-risk, predicts 10 year risk of distant recurrence 32% with tamoxifen alone.   05/10/2016 Echocardiogram   Echo on 05/10/16 showed EF 55-60%.   05/20/2016 - 09/29/2016 Chemotherapy   dose dense Adriamycin and Cytoxan, every 2 weeks starting 05/20/16 for 4 cycles, followed by Taxol weekly (Taxol Changed to Abaraxane 159m/m2 on 09/01/17 due  to skin rash).    10/2016 -  Anti-estrogen oral therapy    Anastrozole started in 10/2016 and stopped after 2 weeks due to side  effects. Switched to Letrozole 11/30/2016    12/27/2016 Imaging   Bone Density 12/27/16  ASSESSMENT: The BMD measured at Femur Neck Right is 0.938 g/cm2 with a T-score of -0.7. This patient is considered normal according to [facility]) criteria.  Site Region Measured Date Measured Age YA BMD Significant CHANGE T-score DualFemur Neck Right 12/27/2016    63.0         -0.5    0.938 g/cm2  AP Spine  L1-L4      12/27/2016    63.0         0.0     1.200 g/cm2   12/27/2016 Imaging   12/27/2016 Bone Density ASSESSMENT: The BMD measured at Femur Neck Right is 0.938 g/cm2 with a T-score of -0.7.   01/05/2017 Survivorship   Survivorship Clinic with with Gardenia Phlegm, NP     01/09/2017 Surgery   LAPAROSCOPIC SALPINGO OOPHORECTOMY with pelvic washings by Dr. [name] and Dr. [name] 01/09/17  Diagnosis Ovaries and fallopian tubes, bilateral - BENIGN OVARIES AND FALLOPIAN TUBES WITH PARATUBAL CYST - NO MALIGNANCY IDENTIFIED    Breast cancer, right ([place])  04/12/2016 Initial Diagnosis   Breast cancer, right ([place])     CURRENT THERAPY: Anastrozole starting 10/2016, stopped after 2 weeks d/t side effects, switched to letrozole 11/2016, tolerating better  INTERVAL HISTORY: [name] presents for virtual f/up as scheduled. She identifies herself x2. She is doing great. Husband had an accident 08/05/20 hit my a car on his moped with slow recovery. Finally doing rehab at home, patient is very active and involved in his care. She is also going to the gym 4-5 times per week and walking several miles walking her dog. Energy and appetite are normal. Denies changes/concerns on her chest wall. Denies new bone/joint pain. Hot flashes fluctuate, worse at night, but tolerable. She increased gabapentin to 2 at night which is helping. Mood is good.   All other systems were reviewed with the patient and are negative.  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*) @ [facility]   Breast cancer, right breast ([place]) dx 03/08/2016---  oncolgoist-  [name]   Stage 1A (pT1, pN0, pM0)  Grade 2,  ER+, PR-, HER2 - ;  invasive ductal carcinoma s/p  bilateral mastectomies w/ sln bx's (negative) and completed chemotherapy (05-20-2016 to 09-29-2016)   Eczema    Family history of breast cancer    Family history of genetic disease carrier    paternal relatives with a BRCA mutation   Family history of ovarian cancer    History of abnormal cervical Pap smear age 99s   History of exercise intolerance 06/27/2006   normal   HSV-2 infection    Hypertension    Prediabetes 05/26/2014   Seasonal and perennial allergic rhinitis    Wears contact lenses     SURGICAL HISTORY: Past Surgical History:  Procedure Laterality Date   BREAST BIOPSY Right 04/13/2016   Procedure: Evacuation RIGHT Breast  Hematoma;  Surgeon: [name], MD;  Location: [facility];  Service: General;  Laterality: Right;   COLONOSCOPY     DILATION AND CURETTAGE OF UTERUS  2008   LAPAROSCOPIC SALPINGO OOPHERECTOMY Bilateral 01/09/2017   Procedure: LAPAROSCOPIC SALPINGO OOPHORECTOMY with pelvic washings;  Surgeon: [name], MD;  Location: [facility];  Service: Gynecology;  Laterality: Bilateral;   MASTECTOMY W/ SENTINEL NODE BIOPSY Bilateral 04/12/2016   Procedure: BILATERAL TOTAL MASTECTOMIES  WITH RIGHT SENTINEL LYMPH NODE BIOPSY;  Surgeon: [name], MD;  Location: [facility];  Service: General;  Laterality: Bilateral;   PORTACATH PLACEMENT Right 05/19/2016   Procedure: INSERTION PORT-A-CATH WITH [place];  Surgeon: [name], MD;  Location: [facility];  Service: General;  Laterality: Right;   TRANSTHORACIC ECHOCARDIOGRAM  05/10/2016   ef 53-66%, grade 1 diastolic dyfuntion/  trivial TR    I have reviewed the social history and family history with the patient and they are unchanged from previous  note.  ALLERGIES:  has No Known Allergies.  MEDICATIONS:  Current Outpatient Medications  Medication Sig Dispense Refill   acyclovir ointment (ZOVIRAX) 5 % Apply 1 application topically every 3 (three) hours. 15 g 1   albuterol (VENTOLIN HFA) 108 (90 Base) MCG/ACT inhaler Inhale 2 puffs into the lungs every 6 (six) hours as needed for wheezing or shortness of breath. 18 g 1   aspirin EC 81 MG tablet Take 81 mg by mouth daily.     cetirizine (ZYRTEC) 10 MG tablet Take 10 mg by mouth at bedtime.      cloNIDine (CATAPRES) 0.2 MG tablet Take  1 tablet  Daily  for BP 90 tablet 3   escitalopram (LEXAPRO) 20 MG tablet Take 1 tablet (20 mg total) by mouth daily. 90 tablet 3   fluticasone (FLONASE) 50 MCG/ACT nasal spray Place 1 spray into both nostrils every morning.      gabapentin (NEURONTIN) 300 MG capsule TAKE 1 TO 2 CAPSULES BY MOUTH AT BEDTIME 90 capsule 0   letrozole (FEMARA) 2.5 MG tablet TAKE 1 TABLET(2.5 MG) BY MOUTH EVERY MORNING 90 tablet 3   losartan (COZAAR) 100 MG tablet Take 1 tablet by mouth once daily for blood pressure 90 tablet 1   MAGNESIUM PO Take by mouth daily.     montelukast (SINGULAIR) 10 MG tablet Take 1 tablet Daily for Allergies 90 tablet 3   Multiple Vitamins-Minerals (ZINC PO) Take by mouth daily.     triamcinolone cream (KENALOG) 0.1 % Apply 1 application topically 2 (two) times daily as needed (eczema). 453.6 g 1   valACYclovir (VALTREX) 500 MG tablet Take 1 tablet (500 mg total) by mouth 2 (two) times daily. Take for 3 days as needed. 30 tablet 1   venlafaxine XR (EFFEXOR-XR) 75 MG 24 hr capsule TAKE 2 CAPSULES BY MOUTH IN THE MORNING AND 1 NIGHTLY 270 capsule 1   VITAMIN D, CHOLECALCIFEROL, PO Take by mouth daily.     VITAMIN E PO Take by mouth daily.     No current facility-administered medications for this visit.    PHYSICAL EXAMINATION: ECOG PERFORMANCE STATUS: 0 - Asymptomatic  There were no vitals filed for this visit. There were no vitals filed for  this visit.  Patient appears well over the phone. Strong voice, clear speech. Mood/affect appear normal. No cough or conversational dyspnea  LABORATORY DATA:  I have reviewed the data as listed CBC Latest Ref Rng & Units 06/29/2020 03/23/2020 12/30/2019  WBC 3.8 - 10.8 Thousand/uL 4.5 5.8 4.6  Hemoglobin 11.7 - 15.5 g/dL 12.5 13.3 13.9  Hematocrit 35.0 - 45.0 % 38.4 39.6 40.6  Platelets 140 - 400 Thousand/uL 217 214 213     CMP Latest Ref Rng & Units 06/29/2020 03/23/2020 12/30/2019  Glucose 65 -  99 mg/dL 87 103(H) 93  BUN 7 - 25 mg/dL _0 Creatinine 0.50 - 0.99 mg/dL 0.75 0.75 0.59  Sodium 135 - 146 mmol/L 142 140 144  Potassium 3.5 - 5.3 mmol/L 4.8 4.8 4.4  Chloride 98 - 110 mmol/L 105 106 103  CO2 20 - 32 mmol/L _1 Calcium 8.6 - 10.4 mg/dL 9.7 9.4 10.2  Total Protein 6.1 - 8.1 g/dL 6.7 6.9 7.0  Total Bilirubin 0.2 - 1.2 mg/dL 0.4 0.3 0.4  Alkaline Phos 38 - 126 U/L - 104 -  AST 10 - 35 U/L _2 ALT 6 - 29 U/L _3 RADIOGRAPHIC STUDIES: I have personally reviewed the radiological images as listed and agreed with the findings in the report. No results found.   ASSESSMENT & PLAN: Chelsea Salinas is a 65 y.o. female with    1. 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 Oncotype RS 49, high risk  -Diagnosed in 02/2016. S/p b/l mastectomy and BSO due to her BRCA1+ mutation. She completed adjuvant chemo AC-T.  -She started antiestrogen with Anastrozole in 10/2016 and stopped after 2 weeks due to side effects. Switched to Letrozole 11/30/2016. Plan for at least 5-7 years.   -given bilateral mastectomy, no role for mammograms  -continue surveillance and AI   2. Hot flashes, secondary to letrozole, insomnia  -stable, worse at night.  -She is on Effexor, lexapro, and Gapentin 66m. increased dose of gabapentin is helping     3. BRCA1 mutation  (+) -She is aware of the high risk of breast cancer and ovarian cancer due to the BRCA1 mutation. S/p bilateral mastectomy and BSO. -does not meet criteria for pancreatic cancer screening due to no family history but understands her slightly increased risk  -Her sister has done genetic testing, results were negative. She has no children.  -I recommend routine f/u Gyn or PCP to continue Pap Smear regularly.    4. Bone Health -Her DEXA scans in 12/2016 and 05/2019 were normal   -Continue VitD and calcium daily    Disposition:  Ms. SBacchiis clinically doing well, tolerating letrozole with stable hot flashes, controlled on effexor, lexapro, and gabapentin. Recent labs are normal. No mammograms given b/l mastectomy. Overall there is no clinical concern for recurrence.   Continue surveillance and letrozole. She is over 4 years from initial diagnosis, the recurrence risk is decreased after 5 years. But given her high risk features, we will continue monitoring >5 years. We reviewed s/sx of recurrence.   Lab and f/up in 6 months, or sooner if needed.   I discussed the assessment and treatment plan with the patient. The patient was provided an opportunity to ask questions and all were answered. The patient agreed with the plan and demonstrated an understanding of the instructions.   The patient was advised to call back or seek an in-person evaluation if the symptoms worsen or if the condition fails to improve as anticipated. Total encounter time was 12 minutes.      LAlla Feeling NP 09/17/20

## 2020-09-22 ENCOUNTER — Other Ambulatory Visit: Payer: Self-pay | Admitting: Adult Health

## 2020-09-22 DIAGNOSIS — I10 Essential (primary) hypertension: Secondary | ICD-10-CM

## 2020-12-19 ENCOUNTER — Other Ambulatory Visit: Payer: Self-pay | Admitting: Nurse Practitioner

## 2020-12-24 ENCOUNTER — Encounter: Payer: Self-pay | Admitting: Nurse Practitioner

## 2020-12-24 NOTE — Progress Notes (Deleted)
AWV and FOLLOW UP  Assessment and Plan:  Annual Medicare Wellness Visit Due annually  Health maintenance reviewed She states will schedule PAP with GYN ***  Essential hypertension - continue medications *** DASH diet, exercise and monitor at home. Call if greater than 130/80.  -     CBC with Differential/Platelet -     CMP/GFR -     TSH  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 breast in female, estrogen receptor positive ([place]) Continue follow up with oncology  BRCA1 positive Continue follow up with oncology   Vitamin D deficiency Continue supplement   Allergic state, subsequent encounter -     montelukast (SINGULAIR) 10 MG tablet; TAKE 1 TABLET(10 MG) BY MOUTH DAILY  Uncomplicated asthma, unspecified asthma severity, unspecified whether persistent -     montelukast (SINGULAIR) 10 MG tablet; TAKE 1 TABLET(10 MG) BY MOUTH DAILY  Eczema, unspecified type -     triamcinolone PRN  Abnormal glucose (hx of prediabetes) Recent A1Cs at goal Discussed diet/exercise, weight management  Defer A1C; check CMP  Hyperlipidemia Continue low cholesterol diet and exercise.  Check lipid panel.  -     Lipid panel  Medication management -     Magnesium  Hot flashes -  Continue medications ***  BMI 24 *** Continue to recommend diet heavy in fruits and veggies and low in animal meats, cheeses, and dairy products, appropriate calorie intake Discuss exercise recommendations routinely Continue to monitor weight at each visit  Excessive drinking of alcohol Recommended reduce intake, ideally avoid daily, limit to 1 drink/occasion or <4/week Ambivalent; continue to monitor and educate  Former smoker (25 pack year, quit 1998) Doesn't meet criteria for low dose CT scan; CXR PRN sx ***  No orders of the defined types were placed in this encounter.   Discussed med's effects and SE's. Screening labs and tests as requested with regular follow-up as recommended. Future  Appointments  Date Time Provider [place]  12/29/2020 11:00 AM Liane Comber, NP [facility] None  03/19/2021  1:00 PM [facility] None  03/19/2021  1:40 PM Truitt Merle, MD [facility] None  07/06/2021  2:00 PM [name], MD [facility] None    Plan:   During the course of the visit the patient was educated and counseled about appropriate screening and preventive services including:   Pneumococcal vaccine  Prevnar 13 Influenza vaccine Td vaccine Screening electrocardiogram Bone densitometry screening Colorectal cancer screening Diabetes screening Glaucoma screening Nutrition counseling  Advanced directives: requested   HPI 65 y.o. female  presents for AWV and follow up. She has Hypertension; Asthma; Allergy; Eczema; Mixed hyperlipidemia; Other abnormal glucose (Hx of prediabetes); Vitamin D deficiency; Hot flashes; 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 breast in female, estrogen receptor positive ([place]); BRCA1 positive; Genetic testing; Breast cancer, right ([place]); Port-A-Cath in place; Scar tissue; Excessive drinking of alcohol; and Former smoker (25 pack year, quit 1998) on their problem list.  She is married, 26 years, no kids. She is retired since 2018, various jobs. Keeps busy at home and enjoys her retirement.   She no longer follows with Dr. Quincy Simmonds, GYN, had salpingooopherectomy, no recent abnormal PAP and was advised none further needed.  Dr. Burr Medico recomemnded she resume PAP. ***  She has atopy with eczema, allergies, and asthma. She is on flonase and singulaire ***for allergies, needs refill albuterol, has not been using it.  Former smoker, 25 pack year, quit 1998. Last CXR in 2018 ***  She was diagnosed with right  breast cancer ER +, PR+ HER2 - 02/2016, following with Dr. Burr Medico, she is on letrozole, had double mastectomy with [name] 04/12/2016, BRCA 1+, had BSP dec 2018.  Just saw him 03/2020 with normal manual exam.   She is on  lexapro, effexor (has been on both and done well for many years ***), gabapentin and catapress for hot flashes *** with letrozole that help.   BMI is There is no height or weight on file to calculate BMI., she has been working on diet and exercise. She walks 3 miles a day most days of the week[facility], doing HIIT, zumba, yoga.  She does drink 2 glasses of wine every evening, ambivalent about reducing.  Wt Readings from Last 3 Encounters:  06/29/20 144 lb 6.4 oz (65.5 kg)  03/23/20 147 lb 4.8 oz (66.8 kg)  12/30/19 144 lb (65.3 kg)   She has not been checking BP at home, today their BP is  , similar by manual recheck historically labile but improved since retiring, takes losartan 100 mg  She does workout, she is walking, she is riding horses  She denies chest pain, shortness of breath, dizziness.   She is not on cholesterol medication and denies myalgias. Her cholesterol is not at goal, atypical elevation at last visit. Has started benefiber. The cholesterol last visit was:   Lab Results  Component Value Date   CHOL 212 (H) 06/29/2020   HDL 85 06/29/2020   LDLCALC 106 (H) 06/29/2020   TRIG 116 06/29/2020   CHOLHDL 2.5 06/29/2020   She has been working on diet/exercise for glucose management, hx of prediabetes (up to 6.7% in 2016 but improved). Last A1C in the office was:  Lab Results  Component Value Date   HGBA1C 5.3 06/29/2020    Last GFR:  Lab Results  Component Value Date   GFRNONAA 83 06/29/2020   Patient is on Vitamin D supplement, one a day, unsure of dosage, takes 2 caps Lab Results  Component Value Date   VD25OH 45 06/27/2019      Current Medications:  Current Outpatient Medications on File Prior to Visit  Medication Sig Dispense Refill   acyclovir ointment (ZOVIRAX) 5 % Apply 1 application topically every 3 (three) hours. 15 g 1   albuterol (VENTOLIN HFA) 108 (90 Base) MCG/ACT inhaler Inhale 2 puffs into the lungs every 6 (six) hours as needed for wheezing  or shortness of breath. 18 g 1   aspirin EC 81 MG tablet Take 81 mg by mouth daily.     cetirizine (ZYRTEC) 10 MG tablet Take 10 mg by mouth at bedtime.      cloNIDine (CATAPRES) 0.2 MG tablet Take  1 tablet  Daily  for BP 90 tablet 3   escitalopram (LEXAPRO) 20 MG tablet Take 1 tablet (20 mg total) by mouth daily. 90 tablet 3   fluticasone (FLONASE) 50 MCG/ACT nasal spray Place 1 spray into both nostrils every morning.      gabapentin (NEURONTIN) 300 MG capsule TAKE 1 TO 2 CAPSULES BY MOUTH AT BEDTIME 90 capsule 1   letrozole (FEMARA) 2.5 MG tablet TAKE 1 TABLET(2.5 MG) BY MOUTH EVERY MORNING 90 tablet 3   losartan (COZAAR) 100 MG tablet Take 1 tablet by mouth once daily for blood pressure 90 tablet 0   MAGNESIUM PO Take by mouth daily.     montelukast (SINGULAIR) 10 MG tablet Take 1 tablet Daily for Allergies 90 tablet 3   Multiple Vitamins-Minerals (ZINC PO)  Take by mouth daily.     triamcinolone cream (KENALOG) 0.1 % Apply 1 application topically 2 (two) times daily as needed (eczema). 453.6 g 1   valACYclovir (VALTREX) 500 MG tablet Take 1 tablet (500 mg total) by mouth 2 (two) times daily. Take for 3 days as needed. 30 tablet 1   venlafaxine XR (EFFEXOR-XR) 75 MG 24 hr capsule TAKE 2 CAPSULES BY MOUTH IN THE MORNING AND 1 NIGHTLY 270 capsule 1   VITAMIN D, CHOLECALCIFEROL, PO Take by mouth daily.     VITAMIN E PO Take by mouth daily.     No current facility-administered medications on file prior to visit.   Health Maintenance:   Immunization History  Administered Date(s) Administered   Influenza Inj Mdck Quad Pf 11/01/2018   Influenza Inj Mdck Quad With Preservative 11/22/2016, 11/23/2017   Influenza, High Dose Seasonal PF 12/30/2019   Influenza, Seasonal, Injecte, Preservative Fre 11/16/2015   Moderna Sars-Covid-2 Vaccination 04/08/2019, 12/24/2019   Pneumococcal Conjugate-13 12/17/2018   Pneumococcal Polysaccharide-23 11/13/2014, 12/30/2019   Td 02/08/1996, 02/07/2006   Tdap  04/27/2017   Zoster Recombinat (Shingrix) 11/01/2018    Tetanus: 2019  Pneumovax: 12/2019 Prevnar 13: 2020 Flu vaccine: 12/2019 shingrix: 2020 but never got 2nd one due to COVID vaccines- plans to restart series *** COVID: 2/2, 2021, moderna + booster ***   Pap: 04/2016 Dr. Quincy Simmonds- will call to reschedule  MGM: 02/2016 s/p mastectomy, Dr. Annamaria Boots doing manual exams  DEXA: 05/2019 NORMAL  Colonoscopy: Nov 2017 Dr. Watt Climes 10 years EGD: N/A  Last eye: Dr. [place], 06/2020, glasses Last dental: Dr. Laurance Flatten, 2022, q34m Patient Care Team: MUnk [name] MD as PCP - General (Internal Medicine) CGardenia Phlegm NP as Nurse Practitioner (Hematology and Oncology) FTruitt Merle MD as Consulting Physician (Hematology) WRolm Bookbinder MD as Consulting Physician (General Surgery)   Medical History:  Past Medical History:  Diagnosis Date   BRCA1 positive    BRCA1 mutation c.[id number] (p.[id number]*) @ [facility]   [place], [place]  oncolgoist-  dr fBurr Medico  Stage 1A (pT1, pN0, pM0)  Grade 2,  ER+, PR-, HER2 - ;  invasive ductal carcinoma s/p  bilateral mastectomies w/ sln bx's (negative) and completed chemotherapy (05-20-2016 to 09-29-2016)   Eczema    Family history of breast cancer    Family history of genetic disease carrier    paternal relatives with a BRCA mutation   Family history of ovarian cancer    History of abnormal cervical Pap smear age 1165s  History of exercise intolerance 06/27/2006   normal   HSV-2 infection    Hypertension    Prediabetes 05/26/2014   Seasonal and perennial allergic rhinitis    Wears contact lenses    Allergies No Known Allergies  SURGICAL HISTORY She  has a past surgical history that includes Dilation and curettage of uterus (2008); Mastectomy w/ sentinel node biopsy (Bilateral, 04/12/2016); Breast biopsy (Right, 04/13/2016); Colonoscopy; Portacath placement (Right, 05/19/2016); transthoracic echocardiogram (05/10/2016); and  Laparoscopic salpingo oophorectomy (Bilateral, 01/09/2017). FAMILY HISTORY Her family history includes Atrial fibrillation in her mother; Breast cancer in her cousin; Heart attack in her father; Heart disease in her father; Hypertension in her father; Ovarian cancer in her cousin, cousin, paternal aunt, paternal aunt, and another family member; Polymyalgia rheumatica in her mother; Prostate cancer in her father. SOCIAL HISTORY She  reports that she quit smoking about 24 years ago. She has a 25.00 pack-year smoking history. She has never used smokeless  tobacco. She reports current alcohol use of about 14.0 standard drinks per week. She reports that she does not use drugs.  Depression/mood screen:   Depression screen [facility] 2/9 06/29/2020  Decreased Interest 0  Down, Depressed, Hopeless 0  PHQ - 2 Score 0  Some recent data might be hidden      Review of Systems  Constitutional:  Positive for diaphoresis (unchanged hot flashes/night sweats). Negative for malaise/fatigue and weight loss.  HENT:  Negative for hearing loss and tinnitus.   Eyes:  Negative for blurred vision and double vision.  Respiratory:  Negative for cough, sputum production, shortness of breath and wheezing.   Cardiovascular:  Negative for chest pain, palpitations, orthopnea, claudication, leg swelling and PND.  Gastrointestinal:  Negative for abdominal pain, blood in stool, constipation, diarrhea, heartburn, melena, nausea and vomiting.  Genitourinary: Negative.   Musculoskeletal:  Negative for falls, joint pain and myalgias.  Skin:  Negative for rash.  Neurological:  Negative for dizziness, tingling, sensory change, weakness and headaches.  Endo/Heme/Allergies:  Positive for environmental allergies (well controlled with meds). Negative for polydipsia.  Psychiatric/Behavioral: Negative.  Negative for depression, memory loss, substance abuse and suicidal ideas. The patient is not nervous/anxious and does not have insomnia.   All  other systems reviewed and are negative.   Physical Exam: Estimated body mass index is 24.03 kg/m as calculated from the following:   Height as of 06/29/20: 5' 5"  (1.651 m).   Weight as of 06/29/20: 144 lb 6.4 oz (65.5 kg). LMP 02/07/1998 (Approximate)  General Appearance: Well nourished, in no apparent distress. Eyes: PERRLA, EOMs, conjunctiva no swelling or erythema Sinuses: No Frontal/maxillary tenderness ENT/Mouth: Ext aud canals clear, normal light reflex with TMs without erythema, bulging.  Good dentition. No erythema, swelling, or exudate on post pharynx. Tonsils not swollen or erythematous. Hearing normal.  Neck: Supple, thyroid normal. No bruits Respiratory: Respiratory effort normal, BS equal bilaterally without rales, rhonchi, wheezing or stridor. Cardio: RRR without murmurs, rubs or gallops. Brisk peripheral pulses without edema.  Chest: symmetric, with normal excursions and percussion. Breasts: defer, just had normal exam by Dr. Annamaria Boots Abdomen: Soft, +BS. Non tender, no guarding, rebound, hernias, masses, or organomegaly. .  Lymphatics: Non tender without lymphadenopathy.  Genitourinary: defer to GYN - she states will schedule Musculoskeletal: Full ROM all peripheral extremities,5/5 strength, and normal gait. Skin:  Warm, dry without lesions, ecchymosis.  Neuro: Cranial nerves intact, reflexes equal bilaterally. Normal muscle tone, no cerebellar symptoms. Sensation intact.  Psych: Awake and oriented X 3, normal affect, Insight and Judgment appropriate.    Medicare Attestation I have personally reviewed: The patient's medical and social history Their use of alcohol, tobacco or illicit drugs Their current medications and supplements The patient's functional ability including ADLs,fall risks, home safety risks, cognitive, and hearing and visual impairment Diet and physical activities Evidence for depression or mood disorders  The patient's weight, height, BMI, and visual  acuity have been recorded in the chart.  I have made referrals, counseling, and provided education to the patient based on review of the above and I have provided the patient with a written personalized care plan for preventive services.     Izora Ribas, NP 12:41 PM [facility]

## 2020-12-25 ENCOUNTER — Other Ambulatory Visit: Payer: Self-pay

## 2020-12-25 MED ORDER — GABAPENTIN 300 MG PO CAPS: ORAL_CAPSULE | ORAL | 1 refills | Status: DC

## 2020-12-28 ENCOUNTER — Other Ambulatory Visit: Payer: Self-pay | Admitting: Adult Health

## 2020-12-28 DIAGNOSIS — I10 Essential (primary) hypertension: Secondary | ICD-10-CM

## 2020-12-29 ENCOUNTER — Ambulatory Visit: Payer: Medicare Other | Admitting: Adult Health

## 2021-01-11 NOTE — Progress Notes (Signed)
ANNUAL MEDICARE VISIT  Assessment and Plan:  Gyneth was seen today for medicare wellness.  Diagnoses and all orders for this visit:  Encounter for Medicare annual wellness exam Due yearly  Essential hypertension -     CBC with Differential/Platelet -     losartan (COZAAR) 100 MG tablet; Take 1 tablet (100 mg total) by mouth daily. for blood pressure - continue medications, DASH diet, exercise and monitor at home. Call if greater than 130/80.   Go to the ER if any chest pain, shortness of breath, nausea, dizziness, severe HA, changes vision/speech  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 breast in female, estrogen receptor positive ([place]) Continue to follow with oncology  BRCA1 positive Continue to follow with oncology  Vitamin D deficiency Continue Vit D supplementation  Mixed hyperlipidemia -     COMPLETE METABOLIC PANEL WITH GFR -     Lipid panel - Continue diet and exercsie  Abnormal glucose -     Hemoglobin A1c Continue diet and exercise  Eczema, unspecified type -     betamethasone dipropionate 0.05 % cream; Apply topically 2 (two) times daily. Try to avoid long exposure of hands to water, monitor symptoms  Uncomplicated asthma, unspecified asthma severity, unspecified whether persistent -     montelukast (SINGULAIR) 10 MG tablet; Take 1 tablet Daily for Allergies  Hot flashes -     escitalopram (LEXAPRO) 20 MG tablet; Take 1 tablet (20 mg total) by mouth daily. -     venlafaxine XR (EFFEXOR-XR) 75 MG 24 hr capsule; 2 tabs in the morning and 1 tab at night Has been on this medication regimen long term, will continue to determine if we can wean off of one of these medications.  Currently with Letrozole she has been unable to be without medications  Medication management Continued  Allergic rhinitis, unspecified seasonality, unspecified trigger -     montelukast (SINGULAIR) 10 MG tablet; Take 1 tablet Daily for Allergies Continue Zyrtec and Flonase with  Singulair, good control  Herpes -     valACYclovir (VALTREX) 500 MG tablet; Take 1 tablet (500 mg total) by mouth 2 (two) times daily. Take for 3 days as needed. Outbreaks are not frequent  Flu vaccine need High dose flu vaccine given    Discussed med's effects and SE's. Screening labs and tests as requested with regular follow-up as recommended. Future Appointments  Date Time Provider [place]  03/19/2021  1:00 PM [facility] None  03/19/2021  1:40 PM Truitt Merle, MD [facility] None  07/06/2021  2:00 PM [name], MD [facility] None  01/12/2022 11:00 AM Magda Bernheim, NP [facility] None    HPI 65 y.o. female  presents for 3 month follow up and annual medicare visit. She has Hypertension; Asthma; Allergy; Eczema; Mixed hyperlipidemia; Other abnormal glucose (Hx of prediabetes); Vitamin D deficiency; Hot flashes; 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 breast in female, estrogen receptor positive ([place]); BRCA1 positive; Genetic testing; Breast cancer, right ([place]); Port-A-Cath in place; Scar tissue; Excessive drinking of alcohol; and Former smoker (25 pack year, quit 1998) on their problem list.  She no longer follows with Dr. Quincy Simmonds, GYN, had salpingooopherectomy, no recent abnormal PAP.   She has atopy with eczema, allergies, and asthma. She is on flonase for allergies, needs refill albuterol, has not been using it. Hands have been having much worse eczema flare.   She was diagnosed with right breast cancer ER +, PR+ HER2 - 02/2016, following with Dr. Burr Medico,  she is on letrozole, had double mastectomy with [name] 04/12/2016, BRCA 1+, had BSP dec 2018.  No further mammogram required due to bilateral mastectomy. Continues on Letrozole, plan is for 5-7 years, started 2018  She is on lexapro, effexor (has been on both and done well for many years), gabapentin and catapress for hot flashes with letrozole that help. Has missed doses and hot flashes are  severe, unable to wean  BMI is Body mass index is 23.93 kg/m., she has been working on diet and exercise. She walks 3 miles a day 5 days of the week, just rejoined the [employer], wants to do more yoga. Is also riding horses Wt Readings from Last 3 Encounters:  01/12/21 143 lb 12.8 oz (65.2 kg)  06/29/20 144 lb 6.4 oz (65.5 kg)  03/23/20 147 lb 4.8 oz (66.8 kg)    Her blood pressure has been controlled at home, today their BP is BP: 140/84 BP Readings from Last 3 Encounters:  01/12/21 140/84  06/29/20 (!) 140/92  03/23/20 (!) 148/94    She does workout, she is walking, she is riding horses, volunteering. She is officially retired.   She denies chest pain, shortness of breath, dizziness.   She is not on cholesterol medication . Her cholesterol is not at goal, atypical elevation at last visit. Has started benefiber. The cholesterol last visit was:   Lab Results  Component Value Date   CHOL 212 (H) 06/29/2020   HDL 85 06/29/2020   LDLCALC 106 (H) 06/29/2020   TRIG 116 06/29/2020   CHOLHDL 2.5 06/29/2020   She has been working on diet/exercise for glucose management, hx of prediabetes (6.7% in 2016). Last A1C in the office was:  Lab Results  Component Value Date   HGBA1C 5.3 06/29/2020    Last GFR:  Lab Results  Component Value Date   GFRNONAA 83 06/29/2020   Patient is on Vitamin D supplement, one a day, unsure of dosage.  Lab Results  Component Value Date   VD25OH 45 06/27/2019   Herpes outbreaks are infrequent but does like to have current script if she gets a flare  Current Medications:  Current Outpatient Medications on File Prior to Visit  Medication Sig Dispense Refill   acyclovir ointment (ZOVIRAX) 5 % Apply 1 application topically every 3 (three) hours. 15 g 1   albuterol (VENTOLIN HFA) 108 (90 Base) MCG/ACT inhaler Inhale 2 puffs into the lungs every 6 (six) hours as needed for wheezing or shortness of breath. 18 g 1   aspirin EC 81 MG tablet Take 81 mg by mouth daily.      cetirizine (ZYRTEC) 10 MG tablet Take 10 mg by mouth at bedtime.      cloNIDine (CATAPRES) 0.2 MG tablet Take  1 tablet  Daily  for BP 90 tablet 3   escitalopram (LEXAPRO) 20 MG tablet Take 1 tablet (20 mg total) by mouth daily. 90 tablet 3   fluticasone (FLONASE) 50 MCG/ACT nasal spray Place 1 spray into both nostrils every morning.      gabapentin (NEURONTIN) 300 MG capsule TAKE 1 TO 2 CAPSULES BY MOUTH AT BEDTIME 90 capsule 1   letrozole (FEMARA) 2.5 MG tablet TAKE 1 TABLET(2.5 MG) BY MOUTH EVERY MORNING 90 tablet 3   losartan (COZAAR) 100 MG tablet Take 1 tablet by mouth once daily for blood pressure 90 tablet 0   MAGNESIUM PO Take by mouth daily.     montelukast (SINGULAIR) 10 MG tablet Take 1 tablet Daily  for Allergies 90 tablet 3   triamcinolone cream (KENALOG) 0.1 % Apply 1 application topically 2 (two) times daily as needed (eczema). 453.6 g 1   valACYclovir (VALTREX) 500 MG tablet Take 1 tablet (500 mg total) by mouth 2 (two) times daily. Take for 3 days as needed. 30 tablet 1   venlafaxine XR (EFFEXOR-XR) 75 MG 24 hr capsule TAKE 2 CAPSULES BY MOUTH IN THE MORNING AND 1 NIGHTLY 270 capsule 1   VITAMIN D, CHOLECALCIFEROL, PO Take by mouth daily.     VITAMIN E PO Take by mouth daily.     Zinc 50 MG TABS Take by mouth.     Multiple Vitamins-Minerals (ZINC PO) Take by mouth daily. (Patient not taking: Reported on 01/12/2021)     No current facility-administered medications on file prior to visit.   Health Maintenance:   Immunization History  Administered Date(s) Administered   Influenza Inj Mdck Quad Pf 11/01/2018   Influenza Inj Mdck Quad With Preservative 11/22/2016, 11/23/2017   Influenza, High Dose Seasonal PF 12/30/2019   Influenza, Seasonal, Injecte, Preservative Fre 11/16/2015   Moderna Sars-Covid-2 Vaccination 04/08/2019, 12/24/2019   Pneumococcal Conjugate-13 12/17/2018   Pneumococcal Polysaccharide-23 11/13/2014, 12/30/2019   Td 02/08/1996, 02/07/2006   Tdap  04/27/2017   Zoster Recombinat (Shingrix) 11/01/2018   Tetanus: 2019  Pneumovax: 12/30/19 Prevnar 13: 2020 Flu vaccine: 01/12/21 shingrix: 2020 but never got 2nd one due to [place]: completed will message dates, Levan Hurst, 3/3   Pap: 04/2016 Dr. Quincy Simmonds- does not need to go back MGM: 02/2016 s/p masectomy, Dr. Annamaria Boots doing manual exams  DEXA: 05/2019 NORMAL Colonoscopy: Nov 2017 Dr. Watt Climes 10 years, due 2027 EGD: N/A  Last eye: Dr. [place], 2022, goes every other year Last dental: Dr. Laurance Flatten, 2022, q25m Patient Care Team: MUnk [name] MD as PCP - General (Internal Medicine) CGardenia Phlegm NP as Nurse Practitioner (Hematology and Oncology) FTruitt Merle MD as Consulting Physician (Hematology) WRolm Bookbinder MD as Consulting Physician (General Surgery)   Medical History:  Past Medical History:  Diagnosis Date   BRCA1 positive    BRCA1 mutation c.[id number] (p.Ser713*) @ [facility]   Breast cancer, right breast ([facility]lim dx 03/08/2016---  oncolgoist-  dr fBurr Medico  Stage 1A (pT1, pN0, pM0)  Grade 2,  ER+, PR-, HER2 - ;  invasive ductal carcinoma s/p  bilateral mastectomies w/ sln bx's (negative) and completed chemotherapy (05-20-2016 to 09-29-2016)   Eczema    Family history of breast cancer    Family history of genetic disease carrier    paternal relatives with a BRCA mutation   Family history of ovarian cancer    History of abnormal cervical Pap smear age [age]s  History of exercise intolerance 06/27/2006   normal   HSV-2 infection    Hypertension    Prediabetes 05/26/2014   Seasonal and perennial allergic rhinitis    Wears contact lenses    Allergies No Known Allergies  SURGICAL HISTORY She  has a past surgical history that includes Dilation and curettage of uterus (2008); Mastectomy w/ sentinel node biopsy (Bilateral, 04/12/2016); Breast biopsy (Right, 04/13/2016); Colonoscopy; Portacath placement (Right, 05/19/2016); transthoracic echocardiogram (05/10/2016); and  Laparoscopic salpingo oophorectomy (Bilateral, 01/09/2017). FAMILY HISTORY Her family history includes Atrial fibrillation in her mother; Breast cancer in her cousin; Heart attack in her father; Heart disease in her father; Hypertension in her father; Ovarian cancer in her cousin, cousin, paternal aunt, paternal aunt, and another family member; Polymyalgia rheumatica in her mother; Prostate cancer  in her father. SOCIAL HISTORY She  reports that she quit smoking about 24 years ago. Her smoking use included cigarettes. She has a 25.00 pack-year smoking history. She has never used smokeless tobacco. She reports current alcohol use of about 14.0 standard drinks per week. She reports that she does not use drugs.  MEDICARE WELLNESS OBJECTIVES: Physical activity: Current Exercise Habits: Home exercise routine;Structured exercise class, Type of exercise: walking;strength training/weights, Time (Minutes): 40, Frequency (Times/Week): 5, Weekly Exercise (Minutes/Week): 200, Intensity: Mild, Exercise limited by: None identified Cardiac risk factors: Cardiac Risk Factors include: advanced age (>69mn, >>72women);smoking/ tobacco exposure;hypertension;dyslipidemia Depression/mood screen:   [facility]  Decreased Interest 0  Down, Depressed, Hopeless 0  PHQ - 2 Score 0  Some recent data might be hidden    ADLs:  In your present state of health, do you have any difficulty performing the following activities: 01/12/2021  Hearing? N  Vision? N  Difficulty concentrating or making decisions? N  Walking or climbing stairs? N  Dressing or bathing? N  Doing errands, shopping? N  Some recent data might be hidden      Cognitive Testing  Alert? Yes  Normal Appearance?Yes  Oriented to person? Yes  Place? Yes   Time? Yes  Recall of three objects?  Yes  Can perform simple calculations? Yes  Displays appropriate judgment?Yes  Can read the correct time from a watch face?Yes  EOL planning:  Does Patient Have a Medical Advance Directive?: No Would patient like information on creating a medical advance directive?: No - Patient declined   Review of Systems  Constitutional:  Negative for malaise/fatigue and weight loss.       Hot flashes  HENT:  Negative for hearing loss and tinnitus.   Eyes:  Negative for blurred vision and double vision.  Respiratory:  Negative for cough, sputum production, shortness of breath and wheezing.   Cardiovascular:  Negative for chest pain, palpitations, orthopnea, claudication, leg swelling and PND.  Gastrointestinal:  Negative for abdominal pain, blood in stool, constipation, diarrhea, heartburn, melena, nausea and vomiting.  Genitourinary: Negative.   Musculoskeletal:  Negative for falls, joint pain and myalgias.  Skin:  Negative for rash.       Eczema of hands  Neurological:  Negative for dizziness, tingling, sensory change, weakness and headaches.  Endo/Heme/Allergies:  Negative for polydipsia.  Psychiatric/Behavioral: Negative.  Negative for depression, memory loss, substance abuse and suicidal ideas. The patient is not nervous/anxious and does not have insomnia.   All other systems reviewed and are negative.   Physical Exam: Estimated body mass index is 23.93 kg/m as calculated from the following:   Height as of 06/29/20: 5' 5"  (1.651 m).   Weight as of this encounter: 143 lb 12.8 oz (65.2 kg). BP 140/84   Pulse (!) 108   Temp 97.7 F (36.5 C)   Wt 143 lb 12.8 oz (65.2 kg)   LMP 02/07/1998 (Approximate)   SpO2 96%   BMI 23.93 kg/m  General Appearance: Well nourished, in no apparent distress. Eyes: PERRLA, EOMs, conjunctiva no swelling or erythema Sinuses: No Frontal/maxillary tenderness ENT/Mouth: Ext aud canals clear, normal light reflex with TMs without erythema, bulging.  Good dentition. No erythema, swelling, or exudate on post pharynx. Tonsils not swollen or erythematous. Hearing normal.  Neck: Supple, thyroid normal. No  bruits Respiratory: Respiratory effort normal, BS equal bilaterally without rales, rhonchi, wheezing or stridor. Cardio: RRR without murmurs, rubs or gallops. Brisk peripheral pulses without edema.  Chest:  symmetric, with normal excursions and percussion. Breasts: defer Abdomen: Soft, +BS. Non tender, no guarding, rebound, hernias, masses, or organomegaly. .  Lymphatics: Non tender without lymphadenopathy.  Genitourinary: defer Musculoskeletal: Full ROM all peripheral extremities,5/5 strength, and normal gait. Skin:  Warm, dry. Scaling dry patches on hands bilaterally Neuro: Cranial nerves intact, reflexes equal bilaterally. Normal muscle tone, no cerebellar symptoms. Sensation intact.  Psych: Awake and oriented X 3, normal affect, Insight and Judgment appropriate.    Medicare Attestation I have personally reviewed: The patient's medical and social history Their use of alcohol, tobacco or illicit drugs Their current medications and supplements The patient's functional ability including ADLs,fall risks, home safety risks, cognitive, and hearing and visual impairment Diet and physical activities Evidence for depression or mood disorders  The patient's weight, height, BMI, and visual acuity have been recorded in the chart.  I have made referrals, counseling, and provided education to the patient based on review of the above and I have provided the patient with a written personalized care plan for preventive services.    Magda Bernheim ANP-C  [facility]  01/12/2021

## 2021-01-12 ENCOUNTER — Ambulatory Visit (INDEPENDENT_AMBULATORY_CARE_PROVIDER_SITE_OTHER): Payer: Medicare Other | Admitting: Nurse Practitioner

## 2021-01-12 ENCOUNTER — Encounter: Payer: Self-pay | Admitting: Nurse Practitioner

## 2021-01-12 ENCOUNTER — Other Ambulatory Visit: Payer: Self-pay

## 2021-01-12 VITALS — BP 140/84 | HR 108 | Temp 97.7°F | Wt 143.8 lb

## 2021-01-12 DIAGNOSIS — R7309 Other abnormal glucose: Secondary | ICD-10-CM

## 2021-01-12 DIAGNOSIS — Z79899 Other long term (current) drug therapy: Secondary | ICD-10-CM

## 2021-01-12 DIAGNOSIS — Z1501 Genetic susceptibility to malignant neoplasm of breast: Secondary | ICD-10-CM | POA: Diagnosis not present

## 2021-01-12 DIAGNOSIS — R232 Flushing: Secondary | ICD-10-CM

## 2021-01-12 DIAGNOSIS — Z23 Encounter for immunization: Secondary | ICD-10-CM

## 2021-01-12 DIAGNOSIS — E559 Vitamin D deficiency, unspecified: Secondary | ICD-10-CM

## 2021-01-12 DIAGNOSIS — L309 Dermatitis, unspecified: Secondary | ICD-10-CM

## 2021-01-12 DIAGNOSIS — Z0000 Encounter for general adult medical examination without abnormal findings: Secondary | ICD-10-CM

## 2021-01-12 DIAGNOSIS — J309 Allergic rhinitis, unspecified: Secondary | ICD-10-CM

## 2021-01-12 DIAGNOSIS — E782 Mixed hyperlipidemia: Secondary | ICD-10-CM

## 2021-01-12 DIAGNOSIS — I10 Essential (primary) hypertension: Secondary | ICD-10-CM

## 2021-01-12 DIAGNOSIS — Z170 Estrogen receptor positive status [ER+]: Secondary | ICD-10-CM

## 2021-01-12 DIAGNOSIS — C50111 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 female breast: Secondary | ICD-10-CM | POA: Diagnosis not present

## 2021-01-12 DIAGNOSIS — R6889 Other general symptoms and signs: Secondary | ICD-10-CM

## 2021-01-12 DIAGNOSIS — B009 Herpesviral infection, unspecified: Secondary | ICD-10-CM

## 2021-01-12 DIAGNOSIS — J45909 Unspecified asthma, uncomplicated: Secondary | ICD-10-CM

## 2021-01-12 DIAGNOSIS — Z0001 Encounter for general adult medical examination with abnormal findings: Secondary | ICD-10-CM

## 2021-01-12 MED ORDER — ESCITALOPRAM OXALATE 20 MG PO TABS: 20.0000 mg | ORAL_TABLET | Freq: Every day | ORAL | 3 refills | Status: DC

## 2021-01-12 MED ORDER — MONTELUKAST SODIUM 10 MG PO TABS: ORAL_TABLET | ORAL | 3 refills | Status: DC

## 2021-01-12 MED ORDER — VALACYCLOVIR HCL 500 MG PO TABS: 500.0000 mg | ORAL_TABLET | Freq: Two times a day (BID) | ORAL | 1 refills | Status: DC

## 2021-01-12 MED ORDER — LOSARTAN POTASSIUM 100 MG PO TABS: 100.0000 mg | ORAL_TABLET | Freq: Every day | ORAL | 0 refills | Status: DC

## 2021-01-12 MED ORDER — BETAMETHASONE DIPROPIONATE 0.05 % EX CREA: TOPICAL_CREAM | Freq: Two times a day (BID) | CUTANEOUS | 0 refills | Status: DC

## 2021-01-12 MED ORDER — VENLAFAXINE HCL ER 75 MG PO CP24: ORAL_CAPSULE | ORAL | 1 refills | Status: DC

## 2021-01-12 NOTE — Patient Instructions (Signed)

## 2021-01-13 ENCOUNTER — Encounter: Payer: Self-pay | Admitting: Nurse Practitioner

## 2021-01-13 ENCOUNTER — Other Ambulatory Visit: Payer: Self-pay | Admitting: Nurse Practitioner

## 2021-01-13 DIAGNOSIS — R232 Flushing: Secondary | ICD-10-CM

## 2021-01-13 LAB — CBC WITH DIFFERENTIAL/PLATELET
Absolute Monocytes: 774 cells/uL (ref 200–950)
Basophils Absolute: 43 cells/uL (ref 0–200)
Basophils Relative: 0.6 %
Eosinophils Absolute: 71 cells/uL (ref 15–500)
Eosinophils Relative: 1 %
HCT: 40.2 % (ref 35.0–45.0)
Hemoglobin: 13.6 g/dL (ref 11.7–15.5)
Lymphs Abs: 1974 cells/uL (ref 850–3900)
MCH: 33.7 pg — ABNORMAL HIGH (ref 27.0–33.0)
MCHC: 33.8 g/dL (ref 32.0–36.0)
MCV: 99.8 fL (ref 80.0–100.0)
MPV: 11 fL (ref 7.5–12.5)
Monocytes Relative: 10.9 %
Neutro Abs: 4239 cells/uL (ref 1500–7800)
Neutrophils Relative %: 59.7 %
Platelets: 237 10*3/uL (ref 140–400)
RBC: 4.03 10*6/uL (ref 3.80–5.10)
RDW: 12.8 % (ref 11.0–15.0)
Total Lymphocyte: 27.8 %
WBC: 7.1 10*3/uL (ref 3.8–10.8)

## 2021-01-13 LAB — COMPLETE METABOLIC PANEL WITH GFR
AG Ratio: 1.8 (calc) (ref 1.0–2.5)
ALT: 14 U/L (ref 6–29)
AST: 19 U/L (ref 10–35)
Albumin: 4.4 g/dL (ref 3.6–5.1)
Alkaline phosphatase (APISO): 91 U/L (ref 37–153)
BUN: 16 mg/dL (ref 7–25)
CO2: 30 mmol/L (ref 20–32)
Calcium: 9.9 mg/dL (ref 8.6–10.4)
Chloride: 102 mmol/L (ref 98–110)
Creat: 0.66 mg/dL (ref 0.50–1.05)
Globulin: 2.4 g/dL (calc) (ref 1.9–3.7)
Glucose, Bld: 87 mg/dL (ref 65–99)
Potassium: 4.2 mmol/L (ref 3.5–5.3)
Sodium: 142 mmol/L (ref 135–146)
Total Bilirubin: 0.4 mg/dL (ref 0.2–1.2)
Total Protein: 6.8 g/dL (ref 6.1–8.1)
eGFR: 96 mL/min/{1.73_m2} (ref >=60)

## 2021-01-13 LAB — LIPID PANEL
Cholesterol: 210 mg/dL — ABNORMAL HIGH (ref <200)
HDL: 100 mg/dL (ref >=50)
LDL Cholesterol (Calc): 87 mg/dL (calc)
Non-HDL Cholesterol (Calc): 110 mg/dL (calc) (ref <130)
Total CHOL/HDL Ratio: 2.1 (calc) (ref <5.0)
Triglycerides: 125 mg/dL (ref <150)

## 2021-01-13 LAB — HEMOGLOBIN A1C
Hgb A1c MFr Bld: 5.4 % of total Hgb (ref <5.7)
Mean Plasma Glucose: 108 mg/dL
eAG (mmol/L): 6 mmol/L

## 2021-01-13 MED ORDER — VENLAFAXINE HCL ER 75 MG PO CP24: ORAL_CAPSULE | ORAL | 1 refills | Status: DC

## 2021-03-17 ENCOUNTER — Telehealth: Payer: Self-pay | Admitting: Hematology

## 2021-03-17 NOTE — Progress Notes (Signed)
[name] OFFICE PROGRESS NOTE  [name], MD [address] [place] [address]  DIAGNOSIS:  Oncology History Overview Note  Cancer Staging 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 breast in female, estrogen receptor positive ([place]) Staging form: Breast, AJCC 8th Edition - Clinical stage from 03/08/2016: Stage IA (cT1c, cN0, cM0, G2, ER: Positive, PR: Negative, HER2: Negative) - Signed by Truitt Merle, MD on 03/15/2016 - Pathologic stage from 04/12/2016: Stage IA (pT1c, pN0, cM0, G2, ER: Positive, PR: Negative, HER2: Negative, Oncotype DX score: 49) - Signed by Truitt Merle, MD on 05/05/2016     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 breast in female, estrogen receptor positive ([place])  03/04/2016 Mammogram   Diagnostic bilateral mammogram and right breast ultrasound showed a 1.7 cm mass in the 12:00 position of the right breast, highly suspicious for malignancy. There is a borderline abnormal right axillary lymph node, also suspicious for metastasis.    03/08/2016 Initial Diagnosis   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 breast in female, estrogen receptor positive ([place])   03/08/2016 Initial Biopsy   Right breast 12:00 core needle biopsy showed invasive ductal carcinoma, grade 2, right axillary lymph node biopsy was negative.   03/08/2016 Receptors her2   ER 20% positive, PR negative, HER-2 negative, Ki-67 20%   03/30/2016 Imaging   MR Bilateral Breast 03/30/2016 IMPRESSION: Known unifocal right breast malignancy. No MRI evidence of malignancy in the left breast.   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  Genes Analyzed: 43 genes on [facility]'s Common Cancers panel (APC, ATM, AXIN2, BARD1, BMPR1A, BRCA1, BRCA2, BRIP1, CDH1, CDKN2A, CHEK2, DICER1, EPCAM, GREM1, HOXB13, KIT, MEN1, MLH1, MSH2, MSH6, MUTYH, NBN, NF1, PALB2, PDGFRA, PMS2, POLD1, POLE, PTEN, RAD50, RAD51C, RAD51D, SDHA, SDHB, SDHC,  SDHD, SMAD4, SMARCA4, STK11, TP53, TSC1, TSC2, VHL).    04/12/2016 Surgery   Bilateral mastectomy and right sentinel lymph node biopsy, by [name]   04/12/2016 Pathology Results   Right breast mastectomy showed invasive grade 2 invasive ductal carcinoma, 1.7 cm, margins were negative, 3 sentinel lymph nodes were negative, left simple mastectomy fibroadenoma, one benign node, no atypia or tumor.    05/04/2016 Oncotype testing   Recurrence score 49, high-risk, predicts 10 year risk of distant recurrence 32% with tamoxifen alone.   05/10/2016 Echocardiogram   Echo on 05/10/16 showed EF 55-60%.   05/20/2016 - 09/29/2016 Chemotherapy   dose dense Adriamycin and Cytoxan, every 2 weeks starting 05/20/16 for 4 cycles, followed by Taxol weekly (Taxol Changed to Abaraxane 162m/m2 on 09/01/17 due to skin rash).    10/2016 -  Anti-estrogen oral therapy    Anastrozole started in 10/2016 and stopped after 2 weeks due to side effects. Switched to Letrozole 11/30/2016    12/27/2016 Imaging   Bone Density 12/27/16  ASSESSMENT: The BMD measured at Femur Neck Right is 0.938 g/cm2 with a T-score of -0.7. This patient is considered normal according to [facility] criteria.  Site Region Measured Date Measured Age YA BMD Significant CHANGE T-score DualFemur Neck Right 12/27/2016    63.0         -0.5    0.938 g/cm2  AP Spine  L1-L4      12/27/2016    63.0         0.0     1.200 g/cm2   12/27/2016 Imaging   12/27/2016 Bone Density ASSESSMENT: The BMD measured at Femur Neck Right is 0.938  g/cm2 with a T-score of -0.7.   01/05/2017 Survivorship   Survivorship Clinic with with Gardenia Phlegm, NP     01/09/2017 Surgery   LAPAROSCOPIC SALPINGO OOPHORECTOMY with pelvic washings by Dr. [name] and Dr. [name] 01/09/17  Diagnosis Ovaries and fallopian tubes, bilateral - BENIGN OVARIES AND FALLOPIAN TUBES WITH PARATUBAL CYST - NO MALIGNANCY IDENTIFIED    Breast cancer,  right ([place])  04/12/2016 Initial Diagnosis   Breast cancer, right ([place])      CURRENT THERAPY:  Anastrozole starting 10/2016, stopped after 2 weeks d/t side effects, switched to letrozole 11/2016, tolerating better.   INTERVAL HISTORY: Chelsea Salinas 65 y.o. female returns to the clinic today for a follow up visit. The patient is feeling fairly well today without any concerning complaints. The patient is currently undergoing treatment with letrozole. She is tolerable this fairly well except for some hot flashes which are tolerable with lexapro, effexor, and gabapentin. Her energy and appetite are normal. She is very active and goes to the gym 4-5x per week. She also walks her dog 3 miles per day about 4x per week. Denies changes/concerns on her chest wall. Denies any new or concerning new bone/joint pain except for expected discomfort in her thumb due to an injury and pain in her knees with exercise. She is on gabapentin to 2 at night for her hot flashes. She notes that she had hot flashes 20 years even prior to starting letrozole. She notes she wants to do what she can to reduce the risk of recurrence and can manage her symptoms. Her Mood is good as long as she is able get good sleep. She is requesting a refill of gabapentin, letrozole, and clonidine today. She will be due for her DEXA scan in April. She is here for a 6 month follow up visit.   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*) @ [facility]   Breast cancer, right breast ([place]) dx 03/08/2016---  oncolgoist-  [name]   Stage 1A (pT1, pN0, pM0)  Grade 2,  ER+, PR-, HER2 - ;  invasive ductal carcinoma s/p  bilateral mastectomies w/ sln bx's (negative) and completed chemotherapy (05-20-2016 to 09-29-2016)   Eczema    Family history of breast cancer    Family history of genetic disease carrier    paternal relatives with a BRCA mutation   Family history of ovarian cancer     History of abnormal cervical Pap smear age 34s   History of exercise intolerance 06/27/2006   normal   HSV-2 infection    Hypertension    Prediabetes 05/26/2014   Seasonal and perennial allergic rhinitis    Wears contact lenses     ALLERGIES:  has No Known Allergies.  MEDICATIONS:  Current Outpatient Medications  Medication Sig Dispense Refill   acyclovir ointment (ZOVIRAX) 5 % Apply 1 application topically every 3 (three) hours. 15 g 1   albuterol (VENTOLIN HFA) 108 (90 Base) MCG/ACT inhaler Inhale 2 puffs into the lungs every 6 (six) hours as needed for wheezing or shortness of breath. 18 g 1   aspirin EC 81 MG tablet Take 81 mg by mouth daily.     betamethasone dipropionate 0.05 % cream Apply topically 2 (two) times daily. 30 g 0   cetirizine (ZYRTEC) 10 MG tablet Take 10 mg by mouth at bedtime.      cloNIDine (CATAPRES) 0.2 MG tablet Take  1 tablet  Daily  for BP 90 tablet 3   escitalopram (LEXAPRO) 20 MG tablet Take 1 tablet (20 mg total) by mouth daily. 90 tablet 3   fluticasone (FLONASE) 50 MCG/ACT nasal spray Place 1 spray into both nostrils every morning.      gabapentin (NEURONTIN) 300 MG capsule TAKE 1 TO 2 CAPSULES BY MOUTH AT BEDTIME 90 capsule 1   letrozole (FEMARA) 2.5 MG tablet TAKE 1 TABLET(2.5 MG) BY MOUTH EVERY MORNING 90 tablet 3   losartan (COZAAR) 100 MG tablet Take 1 tablet (100 mg total) by mouth daily. for blood pressure 90 tablet 0   MAGNESIUM PO Take by mouth daily.     montelukast (SINGULAIR) 10 MG tablet Take 1 tablet Daily for Allergies 90 tablet 3   Multiple Vitamins-Minerals (ZINC PO) Take by mouth daily. (Patient not taking: Reported on 01/12/2021)     triamcinolone cream (KENALOG) 0.1 % Apply 1 application topically 2 (two) times daily as needed (eczema). 453.6 g 1   valACYclovir (VALTREX) 500 MG tablet Take 1 tablet (500 mg total) by mouth 2 (two) times daily. Take for 3 days as needed. 30 tablet 1   venlafaxine XR (EFFEXOR-XR) 75 MG 24 hr capsule 2  tabs in the morning and 1 tab at night 270 capsule 1   VITAMIN D, CHOLECALCIFEROL, PO Take by mouth daily.     VITAMIN E PO Take by mouth daily.     Zinc 50 MG TABS Take by mouth.     No current facility-administered medications for this visit.    SURGICAL HISTORY:  Past Surgical History:  Procedure Laterality Date   BREAST BIOPSY Right 04/13/2016   Procedure: Evacuation RIGHT Breast  Hematoma;  Surgeon: [name], MD;  Location: [facility];  Service: General;  Laterality: Right;   COLONOSCOPY     DILATION AND CURETTAGE OF UTERUS  2008   LAPAROSCOPIC SALPINGO OOPHERECTOMY Bilateral 01/09/2017   Procedure: LAPAROSCOPIC SALPINGO OOPHORECTOMY with pelvic washings;  Surgeon: [name], MD;  Location: [facility];  Service: Gynecology;  Laterality: Bilateral;   MASTECTOMY W/ SENTINEL NODE BIOPSY Bilateral 04/12/2016   Procedure: BILATERAL TOTAL MASTECTOMIES  WITH RIGHT SENTINEL LYMPH NODE BIOPSY;  Surgeon: [name], MD;  Location: [facility];  Service: General;  Laterality: Bilateral;   PORTACATH PLACEMENT Right 05/19/2016   Procedure: INSERTION PORT-A-CATH WITH [place];  Surgeon: [name], MD;  Location: [facility];  Service: General;  Laterality: Right;   TRANSTHORACIC ECHOCARDIOGRAM  05/10/2016   ef 45-03%, grade 1 diastolic dyfuntion/  trivial TR    REVIEW OF SYSTEMS:   Review of Systems  Constitutional: Positive for hot flashes. Negative for appetite change, chills, fatigue, fever and unexpected weight change.  HENT: Negative for mouth sores, nosebleeds, sore throat and trouble swallowing.   Eyes: Negative for eye problems and icterus.  Respiratory: Negative for cough, hemoptysis, shortness of breath and wheezing.   Cardiovascular: Negative for chest pain and leg swelling.  Gastrointestinal: Negative for abdominal pain, constipation, diarrhea, nausea and vomiting.  Genitourinary: Negative for bladder incontinence, difficulty urinating, dysuria,  frequency and hematuria.   Musculoskeletal: Negative for back pain, gait problem, neck pain and neck stiffness.  Skin: Negative for itching and rash.  Neurological: Negative for dizziness, extremity weakness, gait problem, headaches, light-headedness and seizures.  Hematological: Negative for adenopathy. Does not bruise/bleed easily.  Psychiatric/Behavioral: Negative for confusion, depression and sleep disturbance. The patient is not nervous/anxious.     PHYSICAL EXAMINATION:  Last menstrual period 02/07/1998.  ECOG PERFORMANCE STATUS: 1  Physical Exam  Constitutional: Oriented to person, place, and time and well-developed, well-nourished, and in no distress.  HENT:  Head: Normocephalic and atraumatic.  Mouth/Throat: Oropharynx is clear and moist. No oropharyngeal exudate.  Eyes: Conjunctivae are normal. Right eye exhibits no discharge. Left eye exhibits no discharge. No scleral icterus.  Neck: Normal range of motion. Neck supple.  Cardiovascular: Normal rate, regular rhythm, normal heart sounds and intact distal pulses.   Pulmonary/Chest: Effort normal and breath sounds normal. No respiratory distress. No wheezes. No rales.  Abdominal: Soft. Bowel sounds are normal. Exhibits no distension and no mass. There is no tenderness.  Musculoskeletal: Normal range of motion. Exhibits no edema. Status post mastectomy bilaterally.  Lymphadenopathy:    No cervical adenopathy.  Neurological: Alert and oriented to person, place, and time. Exhibits normal muscle tone. Gait normal. Coordination normal.  Skin: Skin is warm and dry. No rash noted. Not diaphoretic. No erythema. No pallor.  Psychiatric: Mood, memory and judgment normal.  Vitals reviewed.  LABORATORY DATA: Lab Results  Component Value Date   WBC 7.1 01/12/2021   HGB 13.6 01/12/2021   HCT 40.2 01/12/2021   MCV 99.8 01/12/2021   PLT 237 01/12/2021      Chemistry      Component Value Date/Time   NA 142 01/12/2021 1614   NA 140  01/25/2017 1112   K 4.2 01/12/2021 1614   K 3.7 01/25/2017 1112   CL 102 01/12/2021 1614   CO2 30 01/12/2021 1614   CO2 26 01/25/2017 1112   BUN 16 01/12/2021 1614   BUN 17.9 01/25/2017 1112   CREATININE 0.66 01/12/2021 1614   CREATININE 0.8 01/25/2017 1112      Component Value Date/Time   CALCIUM 9.9 01/12/2021 1614   CALCIUM 9.3 01/25/2017 1112   ALKPHOS 104 03/23/2020 0929   ALKPHOS 90 01/25/2017 1112   AST 19 01/12/2021 1614   AST 22 08/20/2018 1251   AST 20 01/25/2017 1112   ALT 14 01/12/2021 1614   ALT 17 08/20/2018 1251   ALT 18 01/25/2017 1112   BILITOT 0.4 01/12/2021 1614   BILITOT 0.5 08/20/2018 1251   BILITOT 0.54 01/25/2017 1112       RADIOGRAPHIC STUDIES:  No results found.   ASSESSMENT/PLAN:  Chelsea Salinas is a 66 y.o. female with    1. 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 Oncotype RS 49, high risk  -Diagnosed in 02/2016. S/p b/l mastectomy and BSO due to her BRCA1+ mutation. She completed adjuvant chemo AC-T.  -She started antiestrogen with Anastrozole in 10/2016 and stopped after 2 weeks due to side effects. Switched to Letrozole 11/30/2016. Plan for at least 5-7 years. She will be approaching 5 years in October.  -given bilateral mastectomy, no role for mammograms  -continue surveillance and AI -Follow up in 6 months.    2. Hot flashes, secondary to letrozole, insomnia  -stable, worse at night.  -She is on Effexor, lexapro, and Gapentin 646m. increased dose of gabapentin is helping     3. BRCA1 mutation (+) -She is aware of the high risk of breast cancer and ovarian cancer due to the BRCA1 mutation. S/p bilateral mastectomy and BSO. -does not meet criteria for pancreatic cancer screening due to no family history but understands her slightly increased risk  -Her sister has done genetic testing, results were negative. She has no  children.  -It was previously recommended to have routine f/u Gyn or PCP to continue Pap Smear regularly.    4. Bone Health -Her DEXA scans in 12/2016 and 05/2019 were normal   -Continue VitD and calcium daily  -Order for repeat DEXA scan due in April 2023  Plan: -refilled Letrozole, gabapentin, and clonidine -Labs and follow up 6 months -Ordered DEXA scan    No orders of the defined types were placed in this encounter.    The total time spent in the appointment was 20-29 minutes.   Chelsea Guglielmo L Kiegan Macaraeg, PA-C 03/17/21

## 2021-03-17 NOTE — Telephone Encounter (Signed)
Sch per 2/7 inbasket req, provider req to r/s with APP, pt aware

## 2021-03-19 ENCOUNTER — Inpatient Hospital Stay: Payer: Medicare Other | Attending: Nurse Practitioner

## 2021-03-19 ENCOUNTER — Inpatient Hospital Stay: Payer: Medicare Other | Admitting: Hematology

## 2021-03-19 ENCOUNTER — Inpatient Hospital Stay: Payer: Medicare Other

## 2021-03-19 ENCOUNTER — Inpatient Hospital Stay (HOSPITAL_BASED_OUTPATIENT_CLINIC_OR_DEPARTMENT_OTHER): Payer: Medicare Other | Admitting: Physician Assistant

## 2021-03-19 ENCOUNTER — Other Ambulatory Visit: Payer: Self-pay

## 2021-03-19 VITALS — BP 156/94 | HR 77 | Temp 97.1°F | Resp 17 | Ht 65.0 in | Wt 144.2 lb

## 2021-03-19 DIAGNOSIS — R232 Flushing: Secondary | ICD-10-CM

## 2021-03-19 DIAGNOSIS — Z170 Estrogen receptor positive status [ER+]: Secondary | ICD-10-CM | POA: Diagnosis not present

## 2021-03-19 DIAGNOSIS — C50111 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 female breast: Secondary | ICD-10-CM

## 2021-03-19 DIAGNOSIS — Z79811 Long term (current) use of aromatase inhibitors: Secondary | ICD-10-CM | POA: Diagnosis not present

## 2021-03-19 DIAGNOSIS — Z09 Encounter for follow-up examination after completed treatment for conditions other than malignant neoplasm: Secondary | ICD-10-CM

## 2021-03-19 DIAGNOSIS — Z79899 Other long term (current) drug therapy: Secondary | ICD-10-CM | POA: Diagnosis not present

## 2021-03-19 DIAGNOSIS — G4700 Insomnia, unspecified: Secondary | ICD-10-CM | POA: Diagnosis not present

## 2021-03-19 DIAGNOSIS — N951 Menopausal and female climacteric states: Secondary | ICD-10-CM | POA: Insufficient documentation

## 2021-03-19 DIAGNOSIS — C50911 Malignant neoplasm of unspecified site of right female breast: Secondary | ICD-10-CM | POA: Diagnosis not present

## 2021-03-19 DIAGNOSIS — Z9013 Acquired absence of bilateral breasts and nipples: Secondary | ICD-10-CM | POA: Insufficient documentation

## 2021-03-19 DIAGNOSIS — Z0000 Encounter for general adult medical examination without abnormal findings: Secondary | ICD-10-CM

## 2021-03-19 LAB — CBC WITH DIFFERENTIAL/PLATELET
Abs Immature Granulocytes: 0.01 10*3/uL (ref 0.00–0.07)
Basophils Absolute: 0 10*3/uL (ref 0.0–0.1)
Basophils Relative: 1 %
Eosinophils Absolute: 0.1 10*3/uL (ref 0.0–0.5)
Eosinophils Relative: 2 %
HCT: 38.5 % (ref 36.0–46.0)
Hemoglobin: 13.1 g/dL (ref 12.0–15.0)
Immature Granulocytes: 0 %
Lymphocytes Relative: 42 %
Lymphs Abs: 1.9 10*3/uL (ref 0.7–4.0)
MCH: 33.1 pg (ref 26.0–34.0)
MCHC: 34 g/dL (ref 30.0–36.0)
MCV: 97.2 fL (ref 80.0–100.0)
Monocytes Absolute: 0.6 10*3/uL (ref 0.1–1.0)
Monocytes Relative: 13 %
Neutro Abs: 1.9 10*3/uL (ref 1.7–7.7)
Neutrophils Relative %: 42 %
Platelets: 217 10*3/uL (ref 150–400)
RBC: 3.96 MIL/uL (ref 3.87–5.11)
RDW: 12.4 % (ref 11.5–15.5)
WBC: 4.5 10*3/uL (ref 4.0–10.5)
nRBC: 0 % (ref 0.0–0.2)

## 2021-03-19 LAB — COMPREHENSIVE METABOLIC PANEL
ALT: 12 U/L (ref 0–44)
AST: 16 U/L (ref 15–41)
Albumin: 4.3 g/dL (ref 3.5–5.0)
Alkaline Phosphatase: 92 U/L (ref 38–126)
Anion gap: 7 (ref 5–15)
BUN: 14 mg/dL (ref 8–23)
CO2: 29 mmol/L (ref 22–32)
Calcium: 9.6 mg/dL (ref 8.9–10.3)
Chloride: 105 mmol/L (ref 98–111)
Creatinine, Ser: 0.63 mg/dL (ref 0.44–1.00)
GFR, Estimated: >60 mL/min (ref >60)
Glucose, Bld: 114 mg/dL — ABNORMAL HIGH (ref 70–99)
Potassium: 4.2 mmol/L (ref 3.5–5.1)
Sodium: 141 mmol/L (ref 135–145)
Total Bilirubin: 0.5 mg/dL (ref 0.3–1.2)
Total Protein: 6.7 g/dL (ref 6.5–8.1)

## 2021-03-19 MED ORDER — GABAPENTIN 300 MG PO CAPS: ORAL_CAPSULE | ORAL | 2 refills | Status: DC

## 2021-03-19 MED ORDER — LETROZOLE 2.5 MG PO TABS: ORAL_TABLET | ORAL | 3 refills | Status: DC

## 2021-03-19 MED ORDER — CLONIDINE HCL 0.2 MG PO TABS: ORAL_TABLET | ORAL | 3 refills | Status: DC

## 2021-06-28 ENCOUNTER — Other Ambulatory Visit: Payer: Self-pay | Admitting: Nurse Practitioner

## 2021-06-28 DIAGNOSIS — I10 Essential (primary) hypertension: Secondary | ICD-10-CM

## 2021-06-29 ENCOUNTER — Encounter: Payer: Medicare Other | Admitting: Adult Health

## 2021-07-05 ENCOUNTER — Encounter: Payer: Self-pay | Admitting: Internal Medicine

## 2021-07-05 NOTE — Progress Notes (Unsigned)
Annual Screening/Preventative Visit &  Comprehensive Evaluation &  Examination  Future Appointments  Date Time Provider [place]  07/06/2021            CPE  2:00 PM [name], MD [facility] None  09/17/2021 12:00 PM Truitt Merle, MD [place] None  01/12/2022            Wellness 11:00 AM Magda Bernheim, NP [facility] None  07/07/2022            CPE  2:00 PM [name], MD [facility] None        This very nice 66 y.o.female presents for a Screening /Preventative Visit & comprehensive evaluation and management of multiple medical co-morbidities.  Patient has been followed  for HTN, HLD, Prediabetes  and Vitamin D Deficiency.       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 Treated initially with Chemo per Dr Truitt Merle & now on maintenance Letrozole.        HTN predates since 2014 and she was started on Olmesartan and changed to Losartan 2017 for Insurance preference. Patient's BP has been controlled at home and patient denies any cardiac symptoms as chest pain, palpitations, shortness of breath, dizziness or ankle swelling. Today's BP is at goal - 132/80 .       Patient's hyperlipidemia is controlled with diet .  In 2020, TChol 242/ HDL 90/ LDL 131.  Last lipids were at goal with TChol  210/ high HDL 100/ low LDL 87 :  Lab Results  Component Value Date   CHOL 210 (H) 01/12/2021   HDL 100 01/12/2021   LDLCALC 87 01/12/2021   TRIG 125 01/12/2021   CHOLHDL 2.1 01/12/2021         Patient has hx/o prediabetes  (A1c 5.7%  /2015) and patient denies reactive hypoglycemic symptoms, visual blurring, diabetic polys or paresthesias. Last A1c was normal & at goal :  Lab Results  Component Value Date   HGBA1C 5.4 01/12/2021         Finally, patient has history of Vitamin D Deficiency ("42" /2018)  and last Vitamin D was still low  (goal 70-100)  Lab Results  Component Value Date   VD25OH 45 06/27/2019     Current Outpatient  Medications on File Prior to Visit  Medication Sig   aspirin EC 81 MG tablet Take 81 mg by mouth daily.   betamethasone dipropionate 0.05 % cream Apply topically 2 (two) times daily.   cetirizine (ZYRTEC) 10 MG tablet Take 10 mg by mouth at bedtime.    cloNIDine (CATAPRES) 0.2 MG tablet Take  1 tablet  Daily  for BP   escitalopram (LEXAPRO) 20 MG tablet Take 1 tablet (20 mg total) by mouth daily.   fluticasone (FLONASE) 50 MCG/ACT nasal spray Place 1 spray into both nostrils every morning.    gabapentin (NEURONTIN) 300 MG capsule TAKE 1 TO 2 CAPSULES BY MOUTH AT BEDTIME   letrozole (FEMARA) 2.5 MG tablet TAKE 1 TABLET(2.5 MG) BY MOUTH EVERY MORNING   losartan (COZAAR) 100 MG tablet Take 1 tablet by mouth once daily for blood pressure   MAGNESIUM PO Take by mouth daily.   montelukast (SINGULAIR) 10 MG tablet Take 1 tablet Daily for Allergies   valACYclovir (VALTREX) 500 MG tablet Take 1 tablet (500 mg total) by mouth 2 (two) times daily. Take for 3 days as needed.   venlafaxine XR (EFFEXOR-XR) 75 MG 24 hr  capsule 2 tabs in the morning and 1 tab at night   VITAMIN D, CHOLECALCIFEROL, PO Take by mouth daily.   VITAMIN E PO Take by mouth daily.   Zinc 50 MG TABS Take by mouth.   acyclovir ointment (ZOVIRAX) 5 % Apply 1 application topically every 3 (three) hours. (Patient not taking: Reported on 03/19/2021)   albuterol (VENTOLIN HFA) 108 (90 Base) MCG/ACT inhaler Inhale 2 puffs into the lungs every 6 (six) hours as needed for wheezing or shortness of breath. (Patient not taking: Reported on 03/19/2021)   Multiple Vitamins-Minerals (ZINC PO) Take by mouth daily. (Patient not taking: Reported on 01/12/2021)   triamcinolone cream (KENALOG) 0.1 % Apply 1 application topically 2 (two) times daily as needed (eczema). (Patient not taking: Reported on 03/19/2021)   No current facility-administered medications on file prior to visit.     No Known Allergies   Past Medical History:  Diagnosis Date    BRCA1 positive    BRCA1 mutation c.2138C>G (p.Ser713*) @ [facility]   Breast cancer, right breast ([place]) dx 03/08/2016---  oncolgoist-  [name]   Stage 1A (pT1, pN0, pM0)  Grade 2,  ER+, PR-, HER2 - ;  invasive ductal carcinoma s/p  bilateral mastectomies w/ sln bx's (negative) and completed chemotherapy (05-20-2016 to 09-29-2016)   Eczema    Family history of breast cancer    Family history of genetic disease carrier    paternal relatives with a BRCA mutation   Family history of ovarian cancer    History of abnormal cervical Pap smear age 74s   History of exercise intolerance 06/27/2006   normal   HSV-2 infection    Hypertension    Prediabetes 05/26/2014   Seasonal and perennial allergic rhinitis    Wears contact lenses      Health Maintenance  Topic Date Due   Zoster Vaccines- Shingrix (2 of 2) 12/27/2018   COVID-19 Vaccine (3 - Moderna risk series) 01/21/2020   INFLUENZA VACCINE  09/07/2021   COLONOSCOPY (Pts 45-18yr Insurance coverage will need to be confirmed)  12/22/2025   TETANUS/TDAP  04/28/2027   Pneumonia Vaccine 66 Years old  Completed   DEXA SCAN  Completed   Hepatitis C Screening  Completed   HPV VACCINES  Aged Out     Immunization History  Administered Date(s) Administered   Influenza Inj Mdck Quad Pf 11/01/2018   Influenza Inj Mdck Quad With Preservative 11/22/2016, 11/23/2017   Influenza, High Dose Seasonal PF 12/30/2019, 01/12/2021   Influenza, Seasonal, Injecte, Preservative Fre 11/16/2015   Moderna Sars-Covid-2 Vaccination 04/08/2019, 12/24/2019   Pneumococcal Conjugate-13 12/17/2018   Pneumococcal Polysaccharide-23 11/13/2014, 12/30/2019   Td 02/08/1996, 02/07/2006   Tdap 04/27/2017   Zoster Recombinat (Shingrix) 11/01/2018     Last Colon - 12/23/2015 - Dr MWatt Climes- Recc 10 year f/u due Nov 2027   Last MGM - 03/2016 and had bilat mastectomy w/o reconstruction   Past Surgical History:  Procedure Laterality Date   BREAST BIOPSY Right 04/13/2016    Procedure: Evacuation RIGHT Breast  Hematoma;  Surgeon: MRolm Bookbinder MD;  [place] [facility]  Service: General;  Laterality: Right;   COLONOSCOPY     DILATION AND CURETTAGE OF UTERUS  2008   LAPAROSCOPIC SALPINGO OOPHERECTOMY Bilateral 01/09/2017   Procedure: LAPAROSCOPIC SALPINGO OOPHORECTOMY with pelvic washings;  Surgeon: ANunzio Cobbs MD;  [place] [facility]  Service: Gynecology;  Laterality: Bilateral;   MASTECTOMY W/ SENTINEL NODE BIOPSY Bilateral 04/12/2016   Procedure: BILATERAL  TOTAL MASTECTOMIES  WITH RIGHT SENTINEL LYMPH NODE BIOPSY;  Surgeon: [name], MD;  Location: [place];  Service: General;  Laterality: Bilateral;   PORTACATH PLACEMENT Right 05/19/2016   Procedure: INSERTION PORT-A-CATH WITH [place];  Surgeon: [name], MD;  Location: [place];  Service: General;  Laterality: Right;   TRANSTHORACIC ECHOCARDIOGRAM  05/10/2016   ef 16-55%, grade 1 diastolic dyfuntion/  trivial TR     Family History  Problem Relation Age of Onset   Polymyalgia rheumatica Mother    Atrial fibrillation Mother    Hypertension Father    Heart disease Father        Dec 69   Heart attack Father    Prostate cancer Father        Dx 75s; deceased 50   Breast cancer Cousin        Dx 38s; daughter of a paternal uncle   Ovarian cancer Paternal Aunt        Dx 23; deceased   Ovarian cancer Cousin        Dx 33s; daughter of a paternal uncle   Ovarian cancer Cousin        Dx 65; daughter of a paternal uncle   Ovarian cancer Paternal Aunt        Dx 36s.  BRCA positive   Ovarian cancer Other        pat grandfather's mother     Social History   Tobacco Use   Smoking status: Former    Packs/day: 1.00    Years: 25.00    Pack years: 25.00    Types: Cigarettes    Quit date: 02/08/1996    Years since quitting: 25.4   Smokeless tobacco: Never  Vaping Use   Vaping Use: Never used  Substance Use Topics   Alcohol use: Yes    Alcohol/week: 14.0 standard  drinks    Types: 14 Glasses of wine per week   Drug use: No      ROS Constitutional: Denies fever, chills, weight loss/gain, headaches, insomnia,  night sweats, and change in appetite. Does c/o fatigue. Eyes: Denies redness, blurred vision, diplopia, discharge, itchy, watery eyes.  ENT: Denies discharge, congestion, post nasal drip, epistaxis, sore throat, earache, hearing loss, dental pain, Tinnitus, Vertigo, Sinus pain, snoring.  Cardio: Denies chest pain, palpitations, irregular heartbeat, syncope, dyspnea, diaphoresis, orthopnea, PND, claudication, edema Respiratory: denies cough, dyspnea, DOE, pleurisy, hoarseness, laryngitis, wheezing.  Gastrointestinal: Denies dysphagia, heartburn, reflux, water brash, pain, cramps, nausea, vomiting, bloating, diarrhea, constipation, hematemesis, melena, hematochezia, jaundice, hemorrhoids Genitourinary: Denies dysuria, frequency, urgency, nocturia, hesitancy, discharge, hematuria, flank pain Breast: Breast lumps, nipple discharge, bleeding.  Musculoskeletal: Denies arthralgia, myalgia, stiffness, Jt. Swelling, pain, limp, and strain/sprain. Denies falls. Skin: Denies puritis, rash, hives, warts, acne, eczema, changing in skin lesion Neuro: No weakness, tremor, incoordination, spasms, paresthesia, pain Psychiatric: Denies confusion, memory loss, sensory loss. Denies Depression. Endocrine: Denies change in weight, skin, hair change, nocturia, and paresthesia, diabetic polys, visual blurring, hyper / hypo glycemic episodes.  Heme/Lymph: No excessive bleeding, bruising, enlarged lymph nodes.  Physical Exam  BP 132/80   Pulse 79   Temp 97.9 F (36.6 C)   Resp 16   Ht 5' 5"  (1.651 m)   Wt 144 lb 12.8 oz (65.7 kg)   LMP 02/07/1998 (Approximate)   SpO2 99%   BMI 24.10 kg/m   General Appearance: Well nourished, well groomed and in no apparent distress.  Eyes: PERRLA, EOMs, conjunctiva no swelling or erythema, normal fundi and vessels.  Sinuses:  No frontal/maxillary tenderness ENT/Mouth: EACs patent / TMs  nl. Nares clear without erythema, swelling, mucoid exudates. Oral hygiene is good. No erythema, swelling, or exudate. Tongue normal, non-obstructing. Tonsils not swollen or erythematous. Hearing normal.  Neck: Supple, thyroid not palpable. No bruits, nodes or JVD. Respiratory: Respiratory effort normal.  BS equal and clear bilateral without rales, rhonci, wheezing or stridor. Cardio: Heart sounds are normal with regular rate and rhythm and no murmurs, rubs or gallops. Peripheral pulses are normal and equal bilaterally without edema. No aortic or femoral bruits. Chest: symmetric with normal excursions and percussion. Breasts:  bilat surgically absent with no suspect skin changes.  Abdomen: Flat, soft with bowel sounds active. Nontender, no guarding, rebound, hernias, masses, or organomegaly.  Lymphatics: Non tender without lymphadenopathy.  Musculoskeletal: Full ROM all peripheral extremities, joint stability, 5/5 strength, and normal gait. Skin: Warm and dry without rashes, lesions, cyanosis, clubbing or  ecchymosis.  Neuro: Cranial nerves intact, reflexes equal bilaterally. Normal muscle tone, no cerebellar symptoms. Sensation intact.  Pysch: Alert and oriented x 3, normal affect, Insight and Judgment appropriate.    Assessment and Plan  1. Essential hypertension  - EKG 12-Lead - Urinalysis, Routine w reflex microscopic - Microalbumin / creatinine urine ratio - CBC with Differential/Platelet - COMPLETE METABOLIC PANEL WITH GFR - Magnesium - TSH  2. Hyperlipidemia, mixed  - EKG 12-Lead - Lipid panel - TSH  3. Abnormal glucose  - EKG 12-Lead - Hemoglobin A1c - Insulin, random  4. Vitamin D deficiency  - VITAMIN D 25 Hydroxy  5. Prediabetes  - EKG 12-Lead - Hemoglobin A1c - Insulin, random  6. Malignant neoplasm of right female breast, ([place])   7. Screening for colorectal cancer  - POC Hemoccult Bld/Stl  -->>  Patient Refused   8. Screening for heart disease  - EKG 12-Lead  9. Former smoker (25 pack year, quit 1998)  - EKG 12-Lead  10. Medication management  - Urinalysis, Routine w reflex microscopic - Microalbumin / creatinine urine ratio - CBC with Differential/Platelet - COMPLETE METABOLIC PANEL WITH GFR - Magnesium - Lipid panel - TSH - Hemoglobin A1c - Insulin, random - VITAMIN D 25 Hydroxy           Patient was counseled in prudent diet to achieve/maintain BMI less than 25 for weight control, BP monitoring, regular exercise and medications. Discussed med's effects and SE's. Screening labs and tests as requested with regular follow-up as recommended. Over 40 minutes of exam, counseling, chart review and high complex critical decision making was performed.   Kirtland Bouchard, MD

## 2021-07-05 NOTE — Patient Instructions (Signed)

## 2021-07-06 ENCOUNTER — Encounter: Payer: Self-pay | Admitting: Internal Medicine

## 2021-07-06 ENCOUNTER — Ambulatory Visit (INDEPENDENT_AMBULATORY_CARE_PROVIDER_SITE_OTHER): Payer: Medicare Other | Admitting: Internal Medicine

## 2021-07-06 VITALS — BP 132/80 | HR 79 | Temp 97.9°F | Resp 16 | Ht 65.0 in | Wt 144.8 lb

## 2021-07-06 DIAGNOSIS — I10 Essential (primary) hypertension: Secondary | ICD-10-CM | POA: Diagnosis not present

## 2021-07-06 DIAGNOSIS — E559 Vitamin D deficiency, unspecified: Secondary | ICD-10-CM | POA: Diagnosis not present

## 2021-07-06 DIAGNOSIS — Z1211 Encounter for screening for malignant neoplasm of colon: Secondary | ICD-10-CM

## 2021-07-06 DIAGNOSIS — J309 Allergic rhinitis, unspecified: Secondary | ICD-10-CM

## 2021-07-06 DIAGNOSIS — E782 Mixed hyperlipidemia: Secondary | ICD-10-CM

## 2021-07-06 DIAGNOSIS — C50911 Malignant neoplasm of unspecified site of right female breast: Secondary | ICD-10-CM

## 2021-07-06 DIAGNOSIS — Z136 Encounter for screening for cardiovascular disorders: Secondary | ICD-10-CM | POA: Diagnosis not present

## 2021-07-06 DIAGNOSIS — R7309 Other abnormal glucose: Secondary | ICD-10-CM | POA: Diagnosis not present

## 2021-07-06 DIAGNOSIS — R7303 Prediabetes: Secondary | ICD-10-CM

## 2021-07-06 DIAGNOSIS — L309 Dermatitis, unspecified: Secondary | ICD-10-CM

## 2021-07-06 DIAGNOSIS — J45909 Unspecified asthma, uncomplicated: Secondary | ICD-10-CM

## 2021-07-06 DIAGNOSIS — Z8249 Family history of ischemic heart disease and other diseases of the circulatory system: Secondary | ICD-10-CM | POA: Diagnosis not present

## 2021-07-06 DIAGNOSIS — Z87891 Personal history of nicotine dependence: Secondary | ICD-10-CM | POA: Diagnosis not present

## 2021-07-06 DIAGNOSIS — Z79899 Other long term (current) drug therapy: Secondary | ICD-10-CM

## 2021-07-06 MED ORDER — MONTELUKAST SODIUM 10 MG PO TABS: ORAL_TABLET | ORAL | 3 refills | Status: DC

## 2021-07-07 LAB — HEMOGLOBIN A1C
Hgb A1c MFr Bld: 5.3 % of total Hgb (ref <5.7)
Mean Plasma Glucose: 105 mg/dL
eAG (mmol/L): 5.8 mmol/L

## 2021-07-07 LAB — CBC WITH DIFFERENTIAL/PLATELET
Absolute Monocytes: 660 cells/uL (ref 200–950)
Basophils Absolute: 43 cells/uL (ref 0–200)
Basophils Relative: 0.6 %
Eosinophils Absolute: 57 cells/uL (ref 15–500)
Eosinophils Relative: 0.8 %
HCT: 39.2 % (ref 35.0–45.0)
Hemoglobin: 13.2 g/dL (ref 11.7–15.5)
Lymphs Abs: 2066 cells/uL (ref 850–3900)
MCH: 33.3 pg — ABNORMAL HIGH (ref 27.0–33.0)
MCHC: 33.7 g/dL (ref 32.0–36.0)
MCV: 99 fL (ref 80.0–100.0)
MPV: 10.9 fL (ref 7.5–12.5)
Monocytes Relative: 9.3 %
Neutro Abs: 4274 cells/uL (ref 1500–7800)
Neutrophils Relative %: 60.2 %
Platelets: 234 10*3/uL (ref 140–400)
RBC: 3.96 10*6/uL (ref 3.80–5.10)
RDW: 13.1 % (ref 11.0–15.0)
Total Lymphocyte: 29.1 %
WBC: 7.1 10*3/uL (ref 3.8–10.8)

## 2021-07-07 LAB — COMPLETE METABOLIC PANEL WITH GFR
AG Ratio: 1.9 (calc) (ref 1.0–2.5)
ALT: 12 U/L (ref 6–29)
AST: 20 U/L (ref 10–35)
Albumin: 4.4 g/dL (ref 3.6–5.1)
Alkaline phosphatase (APISO): 82 U/L (ref 37–153)
BUN: 14 mg/dL (ref 7–25)
CO2: 26 mmol/L (ref 20–32)
Calcium: 9.9 mg/dL (ref 8.6–10.4)
Chloride: 101 mmol/L (ref 98–110)
Creat: 0.64 mg/dL (ref 0.50–1.05)
Globulin: 2.3 g/dL (calc) (ref 1.9–3.7)
Glucose, Bld: 98 mg/dL (ref 65–99)
Potassium: 3.9 mmol/L (ref 3.5–5.3)
Sodium: 141 mmol/L (ref 135–146)
Total Bilirubin: 0.4 mg/dL (ref 0.2–1.2)
Total Protein: 6.7 g/dL (ref 6.1–8.1)
eGFR: 97 mL/min/{1.73_m2} (ref >=60)

## 2021-07-07 LAB — URINALYSIS, ROUTINE W REFLEX MICROSCOPIC
Bilirubin Urine: NEGATIVE
Glucose, UA: NEGATIVE
Hgb urine dipstick: NEGATIVE
Ketones, ur: NEGATIVE
Leukocytes,Ua: NEGATIVE
Nitrite: NEGATIVE
Protein, ur: NEGATIVE
Specific Gravity, Urine: 1.019 (ref 1.001–1.035)
pH: 7 (ref 5.0–8.0)

## 2021-07-07 LAB — LIPID PANEL
Cholesterol: 230 mg/dL — ABNORMAL HIGH (ref <200)
HDL: 94 mg/dL (ref >=50)
LDL Cholesterol (Calc): 106 mg/dL (calc) — ABNORMAL HIGH
Non-HDL Cholesterol (Calc): 136 mg/dL (calc) — ABNORMAL HIGH (ref <130)
Total CHOL/HDL Ratio: 2.4 (calc) (ref <5.0)
Triglycerides: 181 mg/dL — ABNORMAL HIGH (ref <150)

## 2021-07-07 LAB — MAGNESIUM: Magnesium: 1.9 mg/dL (ref 1.5–2.5)

## 2021-07-07 LAB — MICROALBUMIN / CREATININE URINE RATIO
Creatinine, Urine: 83 mg/dL (ref 20–275)
Microalb, Ur: <0.2 mg/dL

## 2021-07-07 LAB — INSULIN, RANDOM: Insulin: 10.7 u[IU]/mL

## 2021-07-07 LAB — TSH: TSH: 2.68 mIU/L (ref 0.40–4.50)

## 2021-07-07 LAB — VITAMIN D 25 HYDROXY (VIT D DEFICIENCY, FRACTURES): Vit D, 25-Hydroxy: 42 ng/mL (ref 30–100)

## 2021-07-07 NOTE — Progress Notes (Signed)
<><><><><><><><><><><><><><><><><><><><><><><><><><><><><><><><><> <><><><><><><><><><><><><><><><><><><><><><><><><><><><><><><><><> - Test results slightly outside the reference range are not unusual. If there is anything important, I will review this with you,  otherwise it is considered normal test values.  If you have further questions,  please do not hesitate to contact me at the office or via My Chart.  <><><><><><><><><><><><><><><><><><><><><><><><><><><><><><><><><> <><><><><><><><><><><><><><><><><><><><><><><><><><><><><><><><><>  -   -  Total  Chol =   230  - Elevated             (  Ideal  or  Goal is less than 180  !  )  & -  Bad / Dangerous LDL  Chol =  106    - also Elevated              (  Ideal  or  Goal is less than 70  !  )   -- Cholesterol is STILL too high - Recommend STRICTER  low cholesterol diet   - Cholesterol only comes from animal sources  - ie. meat, dairy, egg yolks  - Eat all the vegetables you want.  - Avoid Meat, Avoid Meat,  Avoid Meat - especially Red Meat - Beef AND Pork .  - Avoid cheese & dairy - milk & ice cream.     - Cheese is the most concentrated form of trans-fats which  is the worst thing to clog up our arteries.   - Veggie cheese is OK which can be found in the fresh  produce section at [employer] or [employer] or [employer] <><><><><><><><><><><><><><><><><><><><><><><><><><><><><><><><><>  -  A1c  -  Normal - No Diabetes  - Great ! <><><><><><><><><><><><><><><><><><><><><><><><><><><><><><><><><>  -  Vitamin D = 42 - is LOW   - Vitamin D goal is between 70-100.   - If you are currently taking 1,000 or 2,000 units  Vitamin D/ Day                                             - Then Recommend that you increase to 5,000 units  /Day     - It is very important as a natural anti-inflammatory and helping the  immune system protect against viral infections, like the Covid-19   helping hair, skin, and nails, as well as reducing  stroke and heart attack risk.   - It helps your bones and helps with mood.  - It also decreases numerous cancer risks so please  take it as directed.   - Low Vit D is associated with a 200-300% higher risk for CANCER   and 200-300% higher risk for HEART   ATTACK  &  STROKE.    - It is also associated with higher death rate at younger ages,   autoimmune diseases like Rheumatoid arthritis, Lupus, Multiple Sclerosis.     - Also many other serious conditions, like depression, Alzheimer's                                               Dementia, infertility, muscle aches, fatigue, fibromyalgia   - just to name a few. <><><><><><><><><><><><><><><><><><><><><><><><><><><><><><><><><> <><><><><><><><><><><><><><><><><><><><><><><><><><><><><><><><><>  -  All Else - CBC - Kidneys - Electrolytes - Liver - Magnesium & Thyroid    - all  Normal / OK  <><><><><><><><><><><><><><><><><><><><><><><><><><><><><><><><><> <><><><><><><><><><><><><><><><><><><><><><><><><><><><><><><><><>

## 2021-07-23 ENCOUNTER — Other Ambulatory Visit: Payer: Self-pay | Admitting: Physician Assistant

## 2021-07-23 DIAGNOSIS — Z170 Estrogen receptor positive status [ER+]: Secondary | ICD-10-CM

## 2021-07-23 DIAGNOSIS — R232 Flushing: Secondary | ICD-10-CM

## 2021-08-19 ENCOUNTER — Telehealth: Payer: Self-pay | Admitting: Hematology

## 2021-08-19 NOTE — Telephone Encounter (Signed)
Left message with rescheduled upcoming appointment due to provider's template. 

## 2021-09-05 ENCOUNTER — Other Ambulatory Visit: Payer: Self-pay | Admitting: Physician Assistant

## 2021-09-05 DIAGNOSIS — R232 Flushing: Secondary | ICD-10-CM

## 2021-09-05 DIAGNOSIS — Z170 Estrogen receptor positive status [ER+]: Secondary | ICD-10-CM

## 2021-09-06 ENCOUNTER — Ambulatory Visit
Admission: RE | Admit: 2021-09-06 | Discharge: 2021-09-06 | Disposition: A | Discharge status: Home / Self Care | Payer: Medicare Other | Source: Ambulatory Visit | Attending: Physician Assistant | Admitting: Physician Assistant

## 2021-09-06 DIAGNOSIS — Z0000 Encounter for general adult medical examination without abnormal findings: Secondary | ICD-10-CM

## 2021-09-06 DIAGNOSIS — C50911 Malignant neoplasm of unspecified site of right female breast: Secondary | ICD-10-CM

## 2021-09-06 DIAGNOSIS — Z09 Encounter for follow-up examination after completed treatment for conditions other than malignant neoplasm: Secondary | ICD-10-CM

## 2021-09-08 ENCOUNTER — Telehealth: Payer: Self-pay

## 2021-09-08 NOTE — Telephone Encounter (Signed)
-----   Message from [employer], PA-C sent at 09/07/2021 11:17 AM EDT ----- Regarding: FW: Can you let her know normal bone density ----- Message ----- From: Interface, Rad Results In Sent: 09/06/2021   1:45 PM EDT To: Cassandra L Heilingoetter, PA-C

## 2021-09-08 NOTE — Telephone Encounter (Signed)
I have LVM for pt advising as indicated. 

## 2021-09-16 ENCOUNTER — Other Ambulatory Visit: Payer: Self-pay

## 2021-09-16 DIAGNOSIS — C50911 Malignant neoplasm of unspecified site of right female breast: Secondary | ICD-10-CM

## 2021-09-17 ENCOUNTER — Inpatient Hospital Stay: Payer: Medicare Other | Attending: Hematology

## 2021-09-17 ENCOUNTER — Other Ambulatory Visit: Payer: Self-pay

## 2021-09-17 ENCOUNTER — Other Ambulatory Visit: Payer: Medicare Other

## 2021-09-17 ENCOUNTER — Inpatient Hospital Stay (HOSPITAL_BASED_OUTPATIENT_CLINIC_OR_DEPARTMENT_OTHER): Payer: Medicare Other | Admitting: Hematology

## 2021-09-17 ENCOUNTER — Ambulatory Visit: Payer: Medicare Other | Admitting: Hematology

## 2021-09-17 VITALS — BP 155/99 | HR 72 | Temp 98.3°F | Resp 15 | Wt 140.2 lb

## 2021-09-17 DIAGNOSIS — C50111 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 female breast: Secondary | ICD-10-CM | POA: Insufficient documentation

## 2021-09-17 DIAGNOSIS — Z170 Estrogen receptor positive status [ER+]: Secondary | ICD-10-CM | POA: Diagnosis not present

## 2021-09-17 DIAGNOSIS — Z9013 Acquired absence of bilateral breasts and nipples: Secondary | ICD-10-CM | POA: Insufficient documentation

## 2021-09-17 DIAGNOSIS — Z79811 Long term (current) use of aromatase inhibitors: Secondary | ICD-10-CM | POA: Diagnosis not present

## 2021-09-17 DIAGNOSIS — C50911 Malignant neoplasm of unspecified site of right female breast: Secondary | ICD-10-CM

## 2021-09-17 DIAGNOSIS — Z79899 Other long term (current) drug therapy: Secondary | ICD-10-CM | POA: Diagnosis not present

## 2021-09-17 DIAGNOSIS — N951 Menopausal and female climacteric states: Secondary | ICD-10-CM | POA: Insufficient documentation

## 2021-09-17 DIAGNOSIS — Z1501 Genetic susceptibility to malignant neoplasm of breast: Secondary | ICD-10-CM | POA: Diagnosis not present

## 2021-09-17 DIAGNOSIS — I10 Essential (primary) hypertension: Secondary | ICD-10-CM | POA: Diagnosis not present

## 2021-09-17 DIAGNOSIS — Z1509 Genetic susceptibility to other malignant neoplasm: Secondary | ICD-10-CM

## 2021-09-17 LAB — CMP (CANCER CENTER ONLY)
ALT: 13 U/L (ref 0–44)
AST: 17 U/L (ref 15–41)
Albumin: 4.6 g/dL (ref 3.5–5.0)
Alkaline Phosphatase: 77 U/L (ref 38–126)
Anion gap: 6 (ref 5–15)
BUN: 17 mg/dL (ref 8–23)
CO2: 30 mmol/L (ref 22–32)
Calcium: 9.4 mg/dL (ref 8.9–10.3)
Chloride: 105 mmol/L (ref 98–111)
Creatinine: 0.67 mg/dL (ref 0.44–1.00)
GFR, Estimated: >60 mL/min (ref >60)
Glucose, Bld: 89 mg/dL (ref 70–99)
Potassium: 4.8 mmol/L (ref 3.5–5.1)
Sodium: 141 mmol/L (ref 135–145)
Total Bilirubin: 0.5 mg/dL (ref 0.3–1.2)
Total Protein: 7 g/dL (ref 6.5–8.1)

## 2021-09-17 LAB — CBC WITH DIFFERENTIAL (CANCER CENTER ONLY)
Abs Immature Granulocytes: 0.01 10*3/uL (ref 0.00–0.07)
Basophils Absolute: 0.1 10*3/uL (ref 0.0–0.1)
Basophils Relative: 1 %
Eosinophils Absolute: 0.1 10*3/uL (ref 0.0–0.5)
Eosinophils Relative: 1 %
HCT: 40.5 % (ref 36.0–46.0)
Hemoglobin: 14.1 g/dL (ref 12.0–15.0)
Immature Granulocytes: 0 %
Lymphocytes Relative: 32 %
Lymphs Abs: 1.9 10*3/uL (ref 0.7–4.0)
MCH: 34.1 pg — ABNORMAL HIGH (ref 26.0–34.0)
MCHC: 34.8 g/dL (ref 30.0–36.0)
MCV: 97.8 fL (ref 80.0–100.0)
Monocytes Absolute: 0.6 10*3/uL (ref 0.1–1.0)
Monocytes Relative: 10 %
Neutro Abs: 3.2 10*3/uL (ref 1.7–7.7)
Neutrophils Relative %: 56 %
Platelet Count: 211 10*3/uL (ref 150–400)
RBC: 4.14 MIL/uL (ref 3.87–5.11)
RDW: 12.2 % (ref 11.5–15.5)
WBC Count: 5.9 10*3/uL (ref 4.0–10.5)
nRBC: 0 % (ref 0.0–0.2)

## 2021-09-17 NOTE — Progress Notes (Signed)
[place]   [phone] [phone]   Clinic Follow up Note   Patient Care Team: [name], MD as PCP - General (Internal Medicine) [name], [name], NP as Nurse Practitioner (Hematology and Oncology) Truitt Merle, MD as Consulting Physician (Hematology) [name], MD as Consulting Physician (General Surgery)  Date of Service:  09/17/2021  CHIEF COMPLAINT: f/u of right breast cancer  CURRENT THERAPY:  Letrozole, starting 11/2016  ASSESSMENT & PLAN:  Chelsea Salinas is a 66 y.o. post-BSO female with   1. 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 Oncotype RS 49, high risk  -Diagnosed in 02/2016. S/p b/l mastectomy and BSO due to her BRCA1+ mutation. She completed adjuvant chemo AC-T.  -She started antiestrogen with Anastrozole in 10/2016 and stopped after 2 weeks due to side effects. Switched to Letrozole 11/30/16. Plan for 7 years. She is tolerating well with mild hot flashes. -given bilateral mastectomy, no role for mammograms  -she is clinically doing very well. Labs reviewed, WNL. Physical exam was unremarkable. There is no clinical concern for recurrence. -continue surveillance and AI. We discussed what to watch for in regards to metastatic recurrence -Follow up in 6 months.    2. Hot flashes, secondary to letrozole, insomnia  -stable, worse at night.  -She is on Effexor, lexapro, and Gapentin $RemoveBefo'600mg'eyvXivUClyJ$ .    3. BRCA1 mutation (+) -She is s/p bilateral mastectomy and BSO. -does not meet criteria for pancreatic cancer screening due to no family history but understands her slightly increased risk  -Her sister has done genetic testing, results were negative. She has no children.    4. Bone Health -bone density has remained normal, most recent DEXA 09/06/21 showed T-score of -0.8 at right hip -Continue VitD and calcium  daily     Plan: -continue letrozole -Labs and follow up with NP Cassie in one year    No problem-specific Assessment & Plan notes found for this encounter.   SUMMARY OF ONCOLOGIC HISTORY: Oncology History Overview Note  Cancer Staging 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 breast in female, estrogen receptor positive ([place]) Staging form: Breast, AJCC 8th Edition - Clinical stage from 03/08/2016: Stage IA (cT1c, cN0, cM0, G2, ER: Positive, PR: Negative, HER2: Negative) - Signed by Truitt Merle, MD on 03/15/2016 - Pathologic stage from 04/12/2016: Stage IA (pT1c, pN0, cM0, G2, ER: Positive, PR: Negative, HER2: Negative, Oncotype DX score: 49) - Signed by Truitt Merle, MD on 05/05/2016     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 breast in female, estrogen receptor positive ([place])  03/04/2016 Mammogram   Diagnostic bilateral mammogram and right breast ultrasound showed a 1.7 cm mass in the 12:00 position of the right breast, highly suspicious for malignancy. There is a borderline abnormal right axillary lymph node, also suspicious for metastasis.    03/08/2016 Initial Diagnosis   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 breast in female, estrogen receptor positive ([place])   03/08/2016 Initial Biopsy   Right breast 12:00 core needle biopsy showed invasive ductal carcinoma, grade 2, right axillary lymph node biopsy was negative.   03/08/2016 Receptors her2   ER 20% positive, PR negative, HER-2 negative, Ki-67 20%   03/30/2016 Imaging   MR Bilateral Breast 03/30/2016 IMPRESSION: Known unifocal right breast malignancy. No MRI evidence of malignancy in the left breast.   04/08/2016 Genetic Testing   Pathogenic mutation in the BRCA1 gene  c.2138C>G (p.Ser713*).  Genes Analyzed: 43 genes on [facility]'s Common Cancers panel (APC, ATM, AXIN2, BARD1, BMPR1A, BRCA1, BRCA2, BRIP1, CDH1, CDKN2A, CHEK2, DICER1, EPCAM, GREM1, HOXB13, KIT, MEN1, MLH1, MSH2, MSH6, MUTYH, NBN, NF1, PALB2, PDGFRA, PMS2,  POLD1, POLE, PTEN, RAD50, RAD51C, RAD51D, SDHA, SDHB, SDHC, SDHD, SMAD4, SMARCA4, STK11, TP53, TSC1, TSC2, VHL).    04/12/2016 Surgery   Bilateral mastectomy and right sentinel lymph node biopsy, by [name]   04/12/2016 Pathology Results   Right breast mastectomy showed invasive grade 2 invasive ductal carcinoma, 1.7 cm, margins were negative, 3 sentinel lymph nodes were negative, left simple mastectomy fibroadenoma, one benign node, no atypia or tumor.    05/04/2016 Oncotype testing   Recurrence score 49, high-risk, predicts 10 year risk of distant recurrence 32% with tamoxifen alone.   05/10/2016 Echocardiogram   Echo on 05/10/16 showed EF 55-60%.   05/20/2016 - 09/29/2016 Chemotherapy   dose dense Adriamycin and Cytoxan, every 2 weeks starting 05/20/16 for 4 cycles, followed by Taxol weekly (Taxol Changed to Abaraxane $RemoveBefo'100mg'okZXQQnINHI$ /m2 on 09/01/17 due to skin rash).    10/2016 -  Anti-estrogen oral therapy    Anastrozole started in 10/2016 and stopped after 2 weeks due to side effects. Switched to Letrozole 11/30/2016    12/27/2016 Imaging   Bone Density 12/27/16  ASSESSMENT: The BMD measured at Femur Neck Right is 0.938 g/cm2 with a T-score of -0.7. This patient is considered normal according to [facility]) criteria.  Site Region Measured Date Measured Age YA BMD Significant CHANGE T-score DualFemur Neck Right 12/27/2016    63.0         -0.5    0.938 g/cm2  AP Spine  L1-L4      12/27/2016    61.0         0.0     1.200 g/cm2   12/27/2016 Imaging   12/27/2016 Bone Density ASSESSMENT: The BMD measured at Femur Neck Right is 0.938 g/cm2 with a T-score of -0.7.   01/05/2017 Survivorship   Survivorship Clinic with with Gardenia Phlegm, NP     01/09/2017 Surgery   LAPAROSCOPIC SALPINGO OOPHORECTOMY with pelvic washings by Dr. [name] and Dr. [name] 01/09/17  Diagnosis Ovaries and fallopian tubes, bilateral - BENIGN OVARIES AND FALLOPIAN TUBES WITH  PARATUBAL CYST - NO MALIGNANCY IDENTIFIED    Breast cancer, right ([place])  04/12/2016 Initial Diagnosis   Breast cancer, right ([place])      INTERVAL HISTORY:  Chelsea Salinas is here for a follow up of breast cancer. She was last seen by PA Cassie on 03/19/21. She presents to the clinic alone. She reports she is doing well overall. She notes stable hot flashes, manageable.   All other systems were reviewed with the patient and are negative.  MEDICAL HISTORY:  Past Medical History:  Diagnosis Date   BRCA1 positive    BRCA1 mutation c.2138C>G (p.Ser713*) @ [facility]   Breast cancer, right breast ([place]) dx 03/08/2016---  oncolgoist-  [name]   Stage 1A (pT1, pN0, pM0)  Grade 2,  ER+, PR-, HER2 - ;  invasive ductal carcinoma s/p  bilateral mastectomies w/ sln bx's (negative) and completed chemotherapy (05-20-2016 to 09-29-2016)   Eczema    Family history of breast cancer    Family history of genetic disease carrier    paternal relatives with a BRCA mutation   Family history of ovarian cancer    History of abnormal cervical Pap smear age 39s   History of exercise  intolerance 06/27/2006   normal   HSV-2 infection    Hypertension    Prediabetes 05/26/2014   Seasonal and perennial allergic rhinitis    Wears contact lenses     SURGICAL HISTORY: Past Surgical History:  Procedure Laterality Date   BREAST BIOPSY Right 04/13/2016   Procedure: Evacuation RIGHT Breast  Hematoma;  Surgeon: [name], MD;  Location: [place];  Service: General;  Laterality: Right;   COLONOSCOPY     DILATION AND CURETTAGE OF UTERUS  2008   LAPAROSCOPIC SALPINGO OOPHERECTOMY Bilateral 01/09/2017   Procedure: LAPAROSCOPIC SALPINGO OOPHORECTOMY with pelvic washings;  Surgeon: [name], MD;  Location: [facility];  Service: Gynecology;  Laterality: Bilateral;   MASTECTOMY W/ SENTINEL NODE BIOPSY Bilateral 04/12/2016   Procedure: BILATERAL TOTAL MASTECTOMIES  WITH RIGHT SENTINEL  LYMPH NODE BIOPSY;  Surgeon: [name], MD;  Location: [place];  Service: General;  Laterality: Bilateral;   PORTACATH PLACEMENT Right 05/19/2016   Procedure: INSERTION PORT-A-CATH WITH [place];  Surgeon: [name], MD;  Location: [place];  Service: General;  Laterality: Right;   TRANSTHORACIC ECHOCARDIOGRAM  05/10/2016   ef 36-62%, grade 1 diastolic dyfuntion/  trivial TR    I have reviewed the social history and family history with the patient and they are unchanged from previous note.  ALLERGIES:  has No Known Allergies.  MEDICATIONS:  Current Outpatient Medications  Medication Sig Dispense Refill   acyclovir ointment (ZOVIRAX) 5 % Apply 1 application topically every 3 (three) hours. (Patient not taking: Reported on 03/19/2021) 15 g 1   albuterol (VENTOLIN HFA) 108 (90 Base) MCG/ACT inhaler Inhale 2 puffs into the lungs every 6 (six) hours as needed for wheezing or shortness of breath. (Patient not taking: Reported on 03/19/2021) 18 g 1   aspirin EC 81 MG tablet Take 81 mg by mouth daily.     betamethasone dipropionate 0.05 % cream Apply topically 2 (two) times daily. 30 g 0   cetirizine (ZYRTEC) 10 MG tablet Take 10 mg by mouth at bedtime.      cloNIDine (CATAPRES) 0.2 MG tablet Take  1 tablet  Daily  for BP 90 tablet 3   escitalopram (LEXAPRO) 20 MG tablet Take 1 tablet (20 mg total) by mouth daily. 90 tablet 3   fluticasone (FLONASE) 50 MCG/ACT nasal spray Place 1 spray into both nostrils every morning.      gabapentin (NEURONTIN) 300 MG capsule TAKE 1 TO 2 CAPSULES BY MOUTH AT BEDTIME 90 capsule 0   letrozole (FEMARA) 2.5 MG tablet TAKE 1 TABLET(2.5 MG) BY MOUTH EVERY MORNING 90 tablet 3   losartan (COZAAR) 100 MG tablet Take 1 tablet by mouth once daily for blood pressure 90 tablet 1   MAGNESIUM PO Take by mouth daily.     montelukast (SINGULAIR) 10 MG tablet Take 1 tablet Daily for Allergies 90 tablet 3   Multiple Vitamins-Minerals (ZINC PO) Take by mouth daily. (Patient not  taking: Reported on 01/12/2021)     triamcinolone cream (KENALOG) 0.1 % Apply 1 application topically 2 (two) times daily as needed (eczema). (Patient not taking: Reported on 03/19/2021) 453.6 g 1   valACYclovir (VALTREX) 500 MG tablet Take 1 tablet (500 mg total) by mouth 2 (two) times daily. Take for 3 days as needed. 30 tablet 1   venlafaxine XR (EFFEXOR-XR) 75 MG 24 hr capsule 2 tabs in the morning and 1 tab at night 270 capsule 1   VITAMIN D, CHOLECALCIFEROL, PO Take by  mouth daily.     VITAMIN E PO Take by mouth daily.     Zinc 50 MG TABS Take by mouth.     No current facility-administered medications for this visit.    PHYSICAL EXAMINATION: ECOG PERFORMANCE STATUS: 0 - Asymptomatic  Vitals:   09/17/21 1048  BP: (!) 155/99  Pulse: 72  Resp: 15  Temp: 98.3 F (36.8 C)  SpO2: 100%   Wt Readings from Last 3 Encounters:  09/17/21 140 lb 3.2 oz (63.6 kg)  07/06/21 144 lb 12.8 oz (65.7 kg)  03/19/21 144 lb 3.2 oz (65.4 kg)     GENERAL:alert, no distress and comfortable SKIN: skin color, texture, turgor are normal, no rashes or significant lesions EYES: normal, Conjunctiva are pink and non-injected, sclera clear  NECK: supple, thyroid normal size, non-tender, without nodularity LYMPH:  no palpable lymphadenopathy in the cervical, axillary LUNGS: clear to auscultation and percussion with normal breathing effort HEART: regular rate & rhythm and no murmurs and no lower extremity edema ABDOMEN:abdomen soft, non-tender and normal bowel sounds Musculoskeletal:no cyanosis of digits and no clubbing  NEURO: alert & oriented x 3 with fluent speech, no focal motor/sensory deficits BREAST: No palpable mass, nodules or adenopathy bilaterally. Breast exam benign.   LABORATORY DATA:  I have reviewed the data as listed    Latest Ref Rng & Units 09/17/2021   10:40 AM 07/06/2021    3:41 PM 03/19/2021    1:10 PM  CBC  WBC 4.0 - 10.5 K/uL 5.9  7.1  4.5   Hemoglobin 12.0 - 15.0 g/dL 14.1  13.2   13.1   Hematocrit 36.0 - 46.0 % 40.5  39.2  38.5   Platelets 150 - 400 K/uL 211  234  217         Latest Ref Rng & Units 09/17/2021   10:40 AM 07/06/2021    3:41 PM 03/19/2021    1:10 PM  CMP  Glucose 70 - 99 mg/dL 89  98  114   BUN 8 - 23 mg/dL $Remove'17  14  14   'YymGQVk$ Creatinine 0.44 - 1.00 mg/dL 0.67  0.64  0.63   Sodium 135 - 145 mmol/L 141  141  141   Potassium 3.5 - 5.1 mmol/L 4.8  3.9  4.2   Chloride 98 - 111 mmol/L 105  101  105   CO2 22 - 32 mmol/L $RemoveB'30  26  29   'lRjZbegt$ Calcium 8.9 - 10.3 mg/dL 9.4  9.9  9.6   Total Protein 6.5 - 8.1 g/dL 7.0  6.7  6.7   Total Bilirubin 0.3 - 1.2 mg/dL 0.5  0.4  0.5   Alkaline Phos 38 - 126 U/L 77   92   AST 15 - 41 U/L $Remo'17  20  16   'OaHiN$ ALT 0 - 44 U/L $Remo'13  12  12       'yhhGL$ RADIOGRAPHIC STUDIES: I have personally reviewed the radiological images as listed and agreed with the findings in the report. No results found.    No orders of the defined types were placed in this encounter.  All questions were answered. The patient knows to call the clinic with any problems, questions or concerns. No barriers to learning was detected. The total time spent in the appointment was 25 minutes.     Truitt Merle, MD 09/17/2021   I, Wilburn Mylar, am acting as scribe for Truitt Merle, MD.   I have reviewed the above documentation for accuracy and completeness, and I  agree with the above.

## 2021-09-20 ENCOUNTER — Telehealth: Payer: Self-pay | Admitting: Hematology

## 2021-09-20 NOTE — Telephone Encounter (Signed)
Left message with follow-up appointment per 8/11 los.

## 2021-10-07 ENCOUNTER — Ambulatory Visit: Payer: Medicare Other | Admitting: Nurse Practitioner

## 2021-10-19 ENCOUNTER — Other Ambulatory Visit: Payer: Self-pay | Admitting: Physician Assistant

## 2021-10-19 DIAGNOSIS — Z170 Estrogen receptor positive status [ER+]: Secondary | ICD-10-CM

## 2021-10-19 DIAGNOSIS — R232 Flushing: Secondary | ICD-10-CM

## 2021-11-16 ENCOUNTER — Encounter: Payer: Self-pay | Admitting: Nurse Practitioner

## 2021-11-18 ENCOUNTER — Ambulatory Visit (INDEPENDENT_AMBULATORY_CARE_PROVIDER_SITE_OTHER): Payer: Medicare Other | Admitting: Nurse Practitioner

## 2021-11-18 VITALS — BP 134/80 | HR 86 | Temp 97.7°F | Ht 65.0 in | Wt 136.0 lb

## 2021-11-18 DIAGNOSIS — L539 Erythematous condition, unspecified: Secondary | ICD-10-CM

## 2021-11-18 DIAGNOSIS — L301 Dyshidrosis [pompholyx]: Secondary | ICD-10-CM | POA: Diagnosis not present

## 2021-11-18 DIAGNOSIS — L299 Pruritus, unspecified: Secondary | ICD-10-CM

## 2021-11-18 DIAGNOSIS — L308 Other specified dermatitis: Secondary | ICD-10-CM

## 2021-11-18 MED ORDER — PREDNISONE 20 MG PO TABS: ORAL_TABLET | ORAL | 0 refills | Status: DC

## 2021-11-18 MED ORDER — TRIAMCINOLONE ACETONIDE 0.1 % EX CREA: 1.0000 | TOPICAL_CREAM | Freq: Two times a day (BID) | CUTANEOUS | 1 refills | Status: DC | PRN

## 2021-11-18 MED ORDER — DEXAMETHASONE SODIUM PHOSPHATE 10 MG/ML IJ SOLN
10.0000 mg | Freq: Once | INTRAMUSCULAR | Status: AC
  Administered 2021-11-18: 10 mg via INTRAMUSCULAR

## 2021-11-18 NOTE — Patient Instructions (Signed)
Dyshidrotic Eczema Dyshidrotic eczema, also known as pompholyx, is a type of eczema that causes very itchy, fluid-filled blisters (vesicles) to form on the hands and feet. It is more common before age 68, though it can affect people of any age. There is no cure, but treatment and certain lifestyle changes can help relieve symptoms. What are the causes? The cause of this condition is not known. What increases the risk? You are more likely to develop this condition if: You wash your hands frequently. You have a personal or family history of eczema, allergies, asthma, or hay fever. You are allergic to metals, such as nickel or cobalt. You work with cement. You smoke. What are the signs or symptoms? Symptoms of this condition may affect the hands, the feet, or both. Symptoms may come and go (recur), and may include: Severe itching. This may happen before blisters appear. Blisters. These may form suddenly. In the early stages, blisters may form near the fingertips. In severe cases, blisters may grow to large blister masses (bullae). Blisters resolve in 2-3 weeks without bursting. This is followed by a dry phase in which itching eases. Pain and swelling. Cracks or long, narrow openings (fissures) in the skin. Severe dryness. Ridges on the nails. How is this diagnosed? This condition may be diagnosed based on: Your symptoms and a physical exam. Your medical history. Skin scrapings to rule out a fungal infection. Testing a swab of fluid for bacteria (culture). Removing a small piece of skin (biopsy) to test for infection or to rule out other conditions. Skin patch tests. These tests involve using patches that contain possible allergens and placing them on your back. Your health care provider will wait a few days and then check to see if an allergic reaction occurred. These tests may be done if your health care provider suspects allergic reactions, or to rule out other types of eczema. You may  be referred to a health care provider who specializes in skin conditions (dermatologist) to help diagnose and treat this condition. How is this treated? There is no cure for this condition, but treatment can help relieve symptoms. Depending on the amount and severity of the blisters, your health care provider may suggest: Avoiding allergens, irritants, or triggers that worsen symptoms. This may involve lifestyle changes, such as: Using different lotions or soaps. Avoiding hot weather or places that will cause you to sweat a lot. Managing stress with coping techniques, such as relaxation and exercise, and asking for help when you need it. Diet changes as recommended by your health care provider. Using a clean, damp towel (cool compress) to relieve symptoms. Soaking in a bath that contains a type of salt that relieves irritation (aluminum acetate soaks). Medicines, such as: Medicine taken by mouth to reduce itching (oral antihistamines). Medicine applied to the skin to reduce swelling and irritation (topical corticosteroids). Medicine that reduces the activity of the body's disease-fighting system (immunosuppressants) to treat inflammation. This may be given in severe cases. Antibiotic medicines to treat bacterial infection. Light therapy (phototherapy). This involves shining ultraviolet (UV) light on the affected skin in order to reduce itchiness and inflammation. Follow these instructions at home: Bathing and skin care  Wash skin gently. After bathing or washing your hands, pat your skin dry. Avoid rubbing your skin. Remove all jewelry before bathing. If the skin under the jewelry stays wet, blisters may form or get worse. Apply cool compresses as told by your health care provider. To do this: Soak a clean towel in  cool water. Wring out excess water until towel is damp. Place the towel over the affected skin. Leave the towel on for 20 minutes at a time, 2-3 times a day. Use mild soaps,  cleansers, and lotions that do not contain dyes, perfumes, or other irritants. Keep your skin hydrated. To do this: Avoid very hot water. Take lukewarm baths or showers. Apply moisturizer within 3 minutes of bathing. This locks in moisture. Medicines Take and apply over-the-counter and prescription medicines only as told by your health care provider. If you were prescribed an antibiotic medicine, take or apply it as told by your health care provider. Do not stop using the antibiotic even if you start to feel better. General instructions Do not use any products that contain nicotine or tobacco. These include cigarettes, chewing tobacco, and vaping devices, such as e-cigarettes. If you need help quitting, ask your health care provider. Identify and avoid triggers and allergens. Keep fingernails short to avoid breaking the skin while scratching. Use waterproof gloves to protect your hands when doing work that keeps your hands wet for a long time. Wear socks to keep your feet dry. Keep all follow-up visits. This is important. Contact a health care provider if: You have symptoms that do not go away. You have signs of infection, such as: Crusting, pus, or a bad smell. More redness, swelling, or pain. Increased warmth in the affected area. Get help right away if: Your skin gets streaking redness with associated pain. Summary Dyshidrotic eczema, also known as pompholyx, is a type of eczema that causes very itchy, fluid-filled blisters (vesicles) to form on the hands and feet. The cause of this condition is not known. There is no cure for this condition, but treatment can help relieve symptoms. Treatment depends on the amount and severity of the blisters. Use mild soaps, cleansers, and lotions that do not contain dyes, perfumes, or other irritants. Keep your skin hydrated. This information is not intended to replace advice given to you by your health care provider. Make sure you discuss any  questions you have with your health care provider. Document Revised: 11/04/2019 Document Reviewed: 11/04/2019 Elsevier Patient Education  2023 [employer]  

## 2021-11-18 NOTE — Progress Notes (Signed)
Assessment and Plan:  Marlita Keil was seen today for an episodic visit.  1. Dyshydrosis  - dexamethasone (DECADRON) injection 10 mg - predniSONE (DELTASONE) 20 MG tablet; Take 2 tabs (40 mg) for 3 days followed by 1 tab (20 mg) for 4 days.  Dispense: 10 tablet; Refill: 0 - Ambulatory referral to Dermatology - triamcinolone cream (KENALOG) 0.1 %; Apply 1 Application topically 2 (two) times daily as needed (eczema).  Dispense: 453.6 g; Refill: 1  2. Other eczema  - dexamethasone (DECADRON) injection 10 mg - predniSONE (DELTASONE) 20 MG tablet; Take 2 tabs (40 mg) for 3 days followed by 1 tab (20 mg) for 4 days.  Dispense: 10 tablet; Refill: 0 - Ambulatory referral to Dermatology - triamcinolone cream (KENALOG) 0.1 %; Apply 1 Application topically 2 (two) times daily as needed (eczema).  Dispense: 453.6 g; Refill: 1  3. Pruritus   4. Erythema  Monitor for any increase in infection symptoms including blisters that are filled with puss, redness, warmth, streaking, fever, N/V.    Notify office for further evaluation and treatment, questions or concerns if s/s fail to improve. The risks and benefits of my recommendations, as well as other treatment options were discussed with the patient today. Questions were answered.  Further disposition pending results of labs. Discussed med's effects and SE's.    Over 15 minutes of exam, counseling, chart review, and critical decision making was performed.   Future Appointments  Date Time Provider [place]  01/12/2022 11:00 AM Alycia Rossetti, NP [facility] None  07/07/2022  2:00 PM [name], MD [facility] None  09/19/2022 11:00 AM [facility] None  09/19/2022 11:30 AM Heilingoetter, Cassandra L, PA-C [facility] None    ------------------------------------------------------------------------------------------------------------------  Patient presents for evaluation of a dyshidrosis rash involving the  hand. Rash started 3 days ago. Lesions are pink, and blistering in texture. Rash has not changed over time. Rash is painful and is pruritic. Associated symptoms: none. Patient denies: fever. Patient has had contacts with similar rash. Patient has not had new exposures (soaps, lotions, laundry detergents, foods, medications, plants, insects or animals).      HPI BP 134/80   Pulse 86   Temp 97.7 F (36.5 C)   Ht 5' 5"  (1.651 m)   Wt 136 lb (61.7 kg)   LMP 02/07/1998 (Approximate)   SpO2 99%   BMI 22.63 kg/m     Past Medical History:  Diagnosis Date   BRCA1 positive    BRCA1 mutation c.2138C>G (p.Ser713*) @ [facility]   Breast cancer, right breast ([place]) dx 03/08/2016---  oncolgoist-  [name]   Stage 1A (pT1, pN0, pM0)  Grade 2,  ER+, PR-, HER2 - ;  invasive ductal carcinoma s/p  bilateral mastectomies w/ sln bx's (negative) and completed chemotherapy (05-20-2016 to 09-29-2016)   Eczema    Family history of breast cancer    Family history of genetic disease carrier    paternal relatives with a BRCA mutation   Family history of ovarian cancer    History of abnormal cervical Pap smear age 17s   History of exercise intolerance 06/27/2006   normal   HSV-2 infection    Hypertension    Prediabetes 05/26/2014   Seasonal and perennial allergic rhinitis    Wears contact lenses      No Known Allergies  Current Outpatient Medications on File Prior to Visit  Medication Sig   acyclovir ointment (ZOVIRAX) 5 % Apply 1 application topically every 3 (three) hours.  albuterol (VENTOLIN HFA) 108 (90 Base) MCG/ACT inhaler Inhale 2 puffs into the lungs every 6 (six) hours as needed for wheezing or shortness of breath.   aspirin EC 81 MG tablet Take 81 mg by mouth daily.   cetirizine (ZYRTEC) 10 MG tablet Take 10 mg by mouth at bedtime.    cloNIDine (CATAPRES) 0.2 MG tablet Take  1 tablet  Daily  for BP   escitalopram (LEXAPRO) 20 MG tablet Take 1 tablet (20 mg total) by mouth daily.    fluticasone (FLONASE) 50 MCG/ACT nasal spray Place 1 spray into both nostrils every morning.    gabapentin (NEURONTIN) 300 MG capsule TAKE 1 TO 2 CAPSULES BY MOUTH AT BEDTIME   letrozole (FEMARA) 2.5 MG tablet TAKE 1 TABLET(2.5 MG) BY MOUTH EVERY MORNING   losartan (COZAAR) 100 MG tablet Take 1 tablet by mouth once daily for blood pressure   MAGNESIUM PO Take by mouth daily.   montelukast (SINGULAIR) 10 MG tablet Take 1 tablet Daily for Allergies   triamcinolone cream (KENALOG) 0.1 % Apply 1 application topically 2 (two) times daily as needed (eczema).   valACYclovir (VALTREX) 500 MG tablet Take 1 tablet (500 mg total) by mouth 2 (two) times daily. Take for 3 days as needed.   venlafaxine XR (EFFEXOR-XR) 75 MG 24 hr capsule 2 tabs in the morning and 1 tab at night   VITAMIN D, CHOLECALCIFEROL, PO Take by mouth daily.   VITAMIN E PO Take by mouth daily.   Zinc 50 MG TABS Take by mouth.   betamethasone dipropionate 0.05 % cream Apply topically 2 (two) times daily.   Multiple Vitamins-Minerals (ZINC PO) Take by mouth daily. (Patient not taking: Reported on 01/12/2021)   No current facility-administered medications on file prior to visit.    ROS: all negative except what is noted in the HPI.   Physical Exam:  BP 134/80   Pulse 86   Temp 97.7 F (36.5 C)   Ht 5' 5"  (1.651 m)   Wt 136 lb (61.7 kg)   LMP 02/07/1998 (Approximate)   SpO2 99%   BMI 22.63 kg/m   General Appearance: NAD.  Awake, conversant and cooperative. Eyes: PERRLA, EOMs intact.  Sclera white.  Conjunctiva without erythema. Sinuses: No frontal/maxillary tenderness.  No nasal discharge. Nares patent.  ENT/Mouth: Ext aud canals clear.  Bilateral TMs w/DOL and without erythema or bulging. Hearing intact.  Posterior pharynx without swelling or exudate.  Tonsils without swelling or erythema.  Neck: Supple.  No masses, nodules or thyromegaly. Respiratory: Effort is regular with non-labored breathing. Breath sounds are equal  bilaterally without rales, rhonchi, wheezing or stridor.  Cardio: RRR with no MRGs. Brisk peripheral pulses without edema.  Abdomen: Active BS in all four quadrants.  Soft and non-tender without guarding, rebound tenderness, hernias or masses. Lymphatics: Non tender without lymphadenopathy.  Musculoskeletal: Full ROM, 5/5 strength, normal ambulation.  No clubbing or cyanosis. Skin: Bilateral hands with mild erythema, dry flaky skin, excoriation. Warm without rashes, lesions, ecchymosis, ulcers, streaking. Neuro: CN II-XII grossly normal. Normal muscle tone without cerebellar symptoms and intact sensation.   Psych: AO X 3,  appropriate mood and affect, insight and judgment.     Darrol Jump, NP 2:03 PM [facility]

## 2021-12-04 ENCOUNTER — Other Ambulatory Visit: Payer: Self-pay | Admitting: Hematology

## 2021-12-04 DIAGNOSIS — C50111 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 female breast: Secondary | ICD-10-CM

## 2021-12-04 DIAGNOSIS — R232 Flushing: Secondary | ICD-10-CM

## 2021-12-06 ENCOUNTER — Encounter: Payer: Self-pay | Admitting: Hematology

## 2021-12-17 ENCOUNTER — Other Ambulatory Visit: Payer: Self-pay

## 2021-12-17 DIAGNOSIS — I10 Essential (primary) hypertension: Secondary | ICD-10-CM

## 2021-12-17 MED ORDER — LOSARTAN POTASSIUM 100 MG PO TABS: 100.0000 mg | ORAL_TABLET | Freq: Every day | ORAL | 1 refills | Status: DC

## 2022-01-04 ENCOUNTER — Other Ambulatory Visit: Payer: Self-pay | Admitting: Nurse Practitioner

## 2022-01-04 DIAGNOSIS — R232 Flushing: Secondary | ICD-10-CM

## 2022-01-12 ENCOUNTER — Ambulatory Visit: Payer: Medicare Other | Admitting: Nurse Practitioner

## 2022-01-21 ENCOUNTER — Other Ambulatory Visit: Payer: Self-pay | Admitting: Hematology

## 2022-01-21 DIAGNOSIS — C50111 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 female breast: Secondary | ICD-10-CM

## 2022-01-21 DIAGNOSIS — R232 Flushing: Secondary | ICD-10-CM

## 2022-01-25 NOTE — Progress Notes (Unsigned)
ANNUAL MEDICARE VISIT  Assessment and Plan:  Chelsea Salinas was seen today for medicare wellness.  Diagnoses and all orders for this visit:  Encounter for Medicare annual wellness exam Due yearly  Essential hypertension - Stop losartan and start Olmesartan 40 mg 1/2 tab QHS -continue DASH diet, exercise and monitor at home. Call if greater than 130/80.   Go to the ER if any chest pain, shortness of breath, nausea, dizziness, severe HA, changes vision/speech - CBC, CMP  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 breast in female, estrogen receptor positive ([place]) Continue to follow with oncology  BRCA1 positive Continue to follow with oncology  Vitamin D deficiency Continue Vit D supplementation  Mixed hyperlipidemia -     COMPLETE METABOLIC PANEL WITH GFR -     Lipid panel - Continue diet and exercsie  Abnormal glucose Continue diet and exercise  Eczema, unspecified type -     betamethasone dipropionate 0.05 % cream; Apply topically 2 (two) times daily. Try to avoid long exposure of hands to water, monitor symptoms  Uncomplicated asthma, unspecified asthma severity, unspecified whether persistent Continue Singulair daily with Albuterol inhaler PRN -     montelukast (SINGULAIR) 10 MG tablet; Take 1 tablet Daily for Allergies  Hot flashes -     escitalopram (LEXAPRO) 20 MG tablet; Take 1 tablet (20 mg total) by mouth daily. -     venlafaxine XR (EFFEXOR-XR) 75 MG 24 hr capsule; 2 tabs in the morning and 1 tab at night Has been on this medication regimen long term, will continue to determine if we can wean off of one of these medications.  Currently with Letrozole she has been unable to be without medications  Medication management - TSH  Allergic rhinitis, unspecified seasonality, unspecified trigger -     montelukast (SINGULAIR) 10 MG tablet; Take 1 tablet Daily for Allergies Continue Zyrtec and Flonase with Singulair, good control  Herpes -     valACYclovir (VALTREX)  500 MG tablet; Take 1 tablet (500 mg total) by mouth 2 (two) times daily. Take for 3 days as needed. Outbreaks are not frequent  Flu vaccine need High dose flu vaccine given    Discussed med's effects and SE's. Screening labs and tests as requested with regular follow-up as recommended. Future Appointments  Date Time Provider [place]  07/07/2022  2:00 PM [name], MD [facility] None  09/19/2022 11:00 AM [facility] None  09/19/2022 11:30 AM Heilingoetter, Cassandra L, PA-C [facility] None  01/26/2023  4:00 PM Alycia Rossetti, NP [facility] None    HPI 66 y.o. female  presents for 3 month follow up and annual medicare visit. She has Hypertension; Asthma; Allergy; Eczema; Mixed hyperlipidemia; Other abnormal glucose (Hx of prediabetes); Vitamin D deficiency; Hot flashes; 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 breast in female, estrogen receptor positive ([place]); BRCA1 positive; Genetic testing; Breast cancer, right ([place]); Port-A-Cath in place; Scar tissue; Excessive drinking of alcohol; and Former smoker (25 pack year, quit 1998) on their problem list.  She no longer follows with Dr. Quincy Simmonds, GYN, had salpingooopherectomy, no recent abnormal PAP.   Her mother passed away 1 week ago, step father is also not doing well.  She is the executor of the will. She got to spend a lot of quality time with her mom prior to her passing.   She has atopic dermatitis with eczema, allergies, and asthma. She is on flonase for allergies. Hands have been having much worse eczema flare.   She  was diagnosed with right breast cancer ER +, PR+ HER2 - 02/2016, following with Dr. Burr Medico, she is on letrozole, had double mastectomy with [name] 04/12/2016, BRCA 1+, had BSP dec 2018.  No further mammogram required due to bilateral mastectomy. Continues on Letrozole, plan is for 5-7 years, started 2018  She is on lexapro, effexor (has been on both and done well for many years),  gabapentin and catapress for hot flashes with letrozole that help. Has missed doses and hot flashes are severe, unable to wean  BMI is Body mass index is 22.8 kg/m., she has been working on diet and exercise. She walks 3 miles a day 5 days of the week, just rejoined the [employer], wants to do more yoga. Is also riding horses Wt Readings from Last 3 Encounters:  01/26/22 137 lb (62.1 kg)  11/18/21 136 lb (61.7 kg)  09/17/21 140 lb 3.2 oz (63.6 kg)    Her blood pressure has not been controlled at home, today their BP is BP: 122/79. Had previously used Olmesartan with better BP control  previously but had stopped due to cost but would like to restart.  BP Readings from Last 3 Encounters:  01/26/22 122/79  11/18/21 134/80  09/17/21 (!) 155/99    She does workout, she is walking, she is riding horses, volunteering. She is officially retired.   She denies chest pain, shortness of breath, dizziness.   She is not on cholesterol medication . Her cholesterol is not at goal, atypical elevation at last visit. Has started benefiber. Limits red meat, dairy, eggs, fried foods. The cholesterol last visit was:   Lab Results  Component Value Date   CHOL 230 (H) 07/06/2021   HDL 94 07/06/2021   LDLCALC 106 (H) 07/06/2021   TRIG 181 (H) 07/06/2021   CHOLHDL 2.4 07/06/2021   She has been working on diet/exercise for glucose management, hx of prediabetes (6.7% in 2016). Last A1C in the office was:  Lab Results  Component Value Date   HGBA1C 5.3 07/06/2021    Last GFR:  Lab Results  Component Value Date   EGFR 97 07/06/2021     Patient is on Vitamin D supplement, one a day, unsure of dosage.  Lab Results  Component Value Date   VD25OH 42 07/06/2021   Herpes outbreaks are infrequent but does like to have current script if she gets a flare  Current Medications:  Current Outpatient Medications on File Prior to Visit  Medication Sig Dispense Refill   acyclovir ointment (ZOVIRAX) 5 % Apply 1 application  topically every 3 (three) hours. 15 g 1   albuterol (VENTOLIN HFA) 108 (90 Base) MCG/ACT inhaler Inhale 2 puffs into the lungs every 6 (six) hours as needed for wheezing or shortness of breath. 18 g 1   aspirin EC 81 MG tablet Take 81 mg by mouth daily.     clobetasol cream (TEMOVATE) 0.05 % Apply topically 2 (two) times daily.     cloNIDine (CATAPRES) 0.2 MG tablet Take  1 tablet  Daily  for BP 90 tablet 3   escitalopram (LEXAPRO) 20 MG tablet Take 1 tablet (20 mg total) by mouth daily. 90 tablet 3   fluticasone (FLONASE) 50 MCG/ACT nasal spray Place 1 spray into both nostrils every morning.      gabapentin (NEURONTIN) 300 MG capsule TAKE 1 TO 2 CAPSULES BY MOUTH AT BEDTIME 90 capsule 0   letrozole (FEMARA) 2.5 MG tablet TAKE 1 TABLET(2.5 MG) BY MOUTH EVERY MORNING 90 tablet  3   MAGNESIUM PO Take by mouth daily.     montelukast (SINGULAIR) 10 MG tablet Take 1 tablet Daily for Allergies 90 tablet 3   Multiple Vitamins-Minerals (ZINC PO) Take by mouth daily.     triamcinolone cream (KENALOG) 0.1 % Apply 1 Application topically 2 (two) times daily as needed (eczema). 453.6 g 1   valACYclovir (VALTREX) 500 MG tablet Take 1 tablet (500 mg total) by mouth 2 (two) times daily. Take for 3 days as needed. 30 tablet 1   venlafaxine XR (EFFEXOR-XR) 75 MG 24 hr capsule TAKE 2 CAPSULES BY MOUTH IN THE MORNING AND 1 AT NIGHT 270 capsule 0   VITAMIN D, CHOLECALCIFEROL, PO Take by mouth daily.     VITAMIN E PO Take by mouth daily.     Zinc 50 MG TABS Take by mouth.     No current facility-administered medications on file prior to visit.   Health Maintenance:   Immunization History  Administered Date(s) Administered   Influenza Inj Mdck Quad Pf 11/01/2018   Influenza Inj Mdck Quad With Preservative 11/22/2016, 11/23/2017   Influenza, High Dose Seasonal PF 12/30/2019, 01/12/2021   Influenza, Seasonal, Injecte, Preservative Fre 11/16/2015   Moderna Sars-Covid-2 Vaccination 04/08/2019, 12/24/2019    Pneumococcal Conjugate-13 12/17/2018   Pneumococcal Polysaccharide-23 11/13/2014, 12/30/2019   Respiratory Syncytial Virus Vaccine,Recomb Aduvanted(Arexvy) 11/24/2021   Td 02/08/1996, 02/07/2006   Tdap 04/27/2017   Zoster Recombinat (Shingrix) 11/01/2018, 07/01/2021   Tetanus: 2019  Pneumovax: 12/30/19 Prevnar 13: 2020 Flu vaccine: 01/12/21 shingrix: 2020 but never got 2nd one due to [place]: completed will message dates, Levan Hurst, 3/3   Pap: 04/2016 Dr. Quincy Simmonds- does not need to go back MGM: 02/2016 s/p masectomy, Dr. Annamaria Boots doing manual exams  DEXA: 05/2019 NORMAL Colonoscopy: Nov 2017 Dr. Watt Climes 10 years, due 2027 EGD: N/A  Last eye: Dr. [place], 2023 Last dental: Dr. Laurance Flatten, 2023  Patient Care Team: [name], MD as PCP - General (Internal Medicine) [name] [name], NP as Nurse Practitioner (Hematology and Oncology) Truitt Merle, MD as Consulting Physician (Hematology) [name], MD as Consulting Physician (General Surgery)   Medical History:  Past Medical History:  Diagnosis Date   BRCA1 positive    BRCA1 mutation c.2138C>G (p.Ser713*) @ [facility]   Breast cancer, right breast ([place]) dx 03/08/2016---  oncolgoist-  [name]   Stage 1A (pT1, pN0, pM0)  Grade 2,  ER+, PR-, HER2 - ;  invasive ductal carcinoma s/p  bilateral mastectomies w/ sln bx's (negative) and completed chemotherapy (05-20-2016 to 09-29-2016)   Eczema    Family history of breast cancer    Family history of genetic disease carrier    paternal relatives with a BRCA mutation   Family history of ovarian cancer    History of abnormal cervical Pap smear age 35s   History of exercise intolerance 06/27/2006   normal   HSV-2 infection    Hypertension    Prediabetes 05/26/2014   Seasonal and perennial allergic rhinitis    Wears contact lenses    Allergies No Known Allergies  SURGICAL HISTORY She  has a past surgical history that includes Dilation and curettage of uterus (2008);  Mastectomy w/ sentinel node biopsy (Bilateral, 04/12/2016); Breast biopsy (Right, 04/13/2016); Colonoscopy; Portacath placement (Right, 05/19/2016); transthoracic echocardiogram (05/10/2016); and Laparoscopic salpingo oophorectomy (Bilateral, 01/09/2017). FAMILY HISTORY Her family history includes Atrial fibrillation in her mother; Breast cancer in her cousin; Heart attack in her father; Heart disease in her father; Hypertension in her  father; Ovarian cancer in her cousin, cousin, paternal aunt, paternal aunt, and another family member; Polymyalgia rheumatica in her mother; Prostate cancer in her father. SOCIAL HISTORY She  reports that she quit smoking about 25 years ago. Her smoking use included cigarettes. She has a 25.00 pack-year smoking history. She has never used smokeless tobacco. She reports current alcohol use of about 14.0 standard drinks of alcohol per week. She reports that she does not use drugs.  MEDICARE WELLNESS OBJECTIVES: Physical activity: Current Exercise Habits: Structured exercise class, Type of exercise: strength training/weights, Time (Minutes): 60, Frequency (Times/Week): 5, Weekly Exercise (Minutes/Week): 300, Intensity: Mild Cardiac risk factors: Cardiac Risk Factors include: advanced age (>72mn, >>78women);hypertension;dyslipidemia Depression/mood screen:      01/26/2022    3:55 PM  Depression screen PHQ 2/9  Decreased Interest 0  Down, Depressed, Hopeless 0  PHQ - 2 Score 0    ADLs:     01/26/2022    3:55 PM 07/05/2021    9:48 PM  In your present state of health, do you have any difficulty performing the following activities:  Hearing? 0 0  Vision? 0 0  Difficulty concentrating or making decisions? 0 0  Walking or climbing stairs? 0   Dressing or bathing? 0 0  Doing errands, shopping? 0 0      Cognitive Testing  Alert? Yes  Normal Appearance?Yes  Oriented to person? Yes  Place? Yes   Time? Yes  Recall of three objects?  Yes  Can perform simple  calculations? Yes  Displays appropriate judgment?Yes  Can read the correct time from a watch face?Yes  EOL planning: Does Patient Have a Medical Advance Directive?: Yes Does patient want to make changes to medical advance directive?: No - Patient declined   Review of Systems  Constitutional:  Negative for malaise/fatigue and weight loss.       Hot flashes  HENT:  Negative for hearing loss and tinnitus.   Eyes:  Negative for blurred vision and double vision.  Respiratory:  Negative for cough, sputum production, shortness of breath and wheezing.   Cardiovascular:  Negative for chest pain, palpitations, orthopnea, claudication, leg swelling and PND.  Gastrointestinal:  Negative for abdominal pain, blood in stool, constipation, diarrhea, heartburn, melena, nausea and vomiting.  Genitourinary: Negative.   Musculoskeletal:  Negative for falls, joint pain and myalgias.  Skin:  Negative for rash.       Eczema of hands  Neurological:  Negative for dizziness, tingling, sensory change, weakness and headaches.  Endo/Heme/Allergies:  Negative for polydipsia.  Psychiatric/Behavioral: Negative.  Negative for depression, memory loss, substance abuse and suicidal ideas. The patient is not nervous/anxious and does not have insomnia.   All other systems reviewed and are negative.    Physical Exam: Estimated body mass index is 22.8 kg/m as calculated from the following:   Height as of this encounter: _0  (1.651 m).   Weight as of this encounter: 137 lb (62.1 kg). BP 122/79   Pulse 94   Temp (!) 97.5 F (36.4 C)   Ht _1  (1.651 m)   Wt 137 lb (62.1 kg)   LMP 02/07/1998 (Approximate)   SpO2 99%   BMI 22.80 kg/m  General Appearance: Well nourished, in no apparent distress. Eyes: PERRLA, EOMs, conjunctiva no swelling or erythema Sinuses: No Frontal/maxillary tenderness ENT/Mouth: Ext aud canals clear, normal light reflex with TMs without erythema, bulging.  Good dentition. No erythema,  swelling, or exudate on post pharynx. Tonsils not swollen or erythematous.  Hearing normal.  Neck: Supple, thyroid normal. No bruits Respiratory: Respiratory effort normal, BS equal bilaterally without rales, rhonchi, wheezing or stridor. Cardio: RRR without murmurs, rubs or gallops. Brisk peripheral pulses without edema.  Chest: symmetric, with normal excursions and percussion. Breasts: defer Abdomen: Soft, +BS. Non tender, no guarding, rebound, hernias, masses, or organomegaly. .  Lymphatics: Non tender without lymphadenopathy.  Genitourinary: defer Musculoskeletal: Full ROM all peripheral extremities,5/5 strength, and normal gait. Skin:  Warm, dry. Scaling dry patches on hands bilaterally Neuro: Cranial nerves intact, reflexes equal bilaterally. Normal muscle tone, no cerebellar symptoms. Sensation intact.  Psych: Awake and oriented X 3, normal affect, Insight and Judgment appropriate.    Medicare Attestation I have personally reviewed: The patient's medical and social history Their use of alcohol, tobacco or illicit drugs Their current medications and supplements The patient's functional ability including ADLs,fall risks, home safety risks, cognitive, and hearing and visual impairment Diet and physical activities Evidence for depression or mood disorders  The patient's weight, height, BMI, and visual acuity have been recorded in the chart.  I have made referrals, counseling, and provided education to the patient based on review of the above and I have provided the patient with a written personalized care plan for preventive services.    Magda Bernheim ANP-C  [facility]  01/26/2022

## 2022-01-26 ENCOUNTER — Ambulatory Visit (INDEPENDENT_AMBULATORY_CARE_PROVIDER_SITE_OTHER): Payer: Medicare Other | Admitting: Nurse Practitioner

## 2022-01-26 ENCOUNTER — Encounter: Payer: Self-pay | Admitting: Nurse Practitioner

## 2022-01-26 VITALS — BP 122/79 | HR 94 | Temp 97.5°F | Ht 65.0 in | Wt 137.0 lb

## 2022-01-26 DIAGNOSIS — C50911 Malignant neoplasm of unspecified site of right female breast: Secondary | ICD-10-CM

## 2022-01-26 DIAGNOSIS — J309 Allergic rhinitis, unspecified: Secondary | ICD-10-CM

## 2022-01-26 DIAGNOSIS — Z23 Encounter for immunization: Secondary | ICD-10-CM | POA: Diagnosis not present

## 2022-01-26 DIAGNOSIS — Z1501 Genetic susceptibility to malignant neoplasm of breast: Secondary | ICD-10-CM

## 2022-01-26 DIAGNOSIS — E559 Vitamin D deficiency, unspecified: Secondary | ICD-10-CM

## 2022-01-26 DIAGNOSIS — R232 Flushing: Secondary | ICD-10-CM

## 2022-01-26 DIAGNOSIS — I10 Essential (primary) hypertension: Secondary | ICD-10-CM

## 2022-01-26 DIAGNOSIS — J45909 Unspecified asthma, uncomplicated: Secondary | ICD-10-CM

## 2022-01-26 DIAGNOSIS — R6889 Other general symptoms and signs: Secondary | ICD-10-CM

## 2022-01-26 DIAGNOSIS — Z0000 Encounter for general adult medical examination without abnormal findings: Secondary | ICD-10-CM

## 2022-01-26 DIAGNOSIS — R7309 Other abnormal glucose: Secondary | ICD-10-CM

## 2022-01-26 DIAGNOSIS — B009 Herpesviral infection, unspecified: Secondary | ICD-10-CM

## 2022-01-26 DIAGNOSIS — Z79899 Other long term (current) drug therapy: Secondary | ICD-10-CM

## 2022-01-26 DIAGNOSIS — Z0001 Encounter for general adult medical examination with abnormal findings: Secondary | ICD-10-CM

## 2022-01-26 DIAGNOSIS — E782 Mixed hyperlipidemia: Secondary | ICD-10-CM | POA: Diagnosis not present

## 2022-01-26 DIAGNOSIS — L309 Dermatitis, unspecified: Secondary | ICD-10-CM

## 2022-01-26 MED ORDER — OLMESARTAN MEDOXOMIL 40 MG PO TABS: 40.0000 mg | ORAL_TABLET | Freq: Every day | ORAL | 11 refills | Status: DC

## 2022-01-26 NOTE — Patient Instructions (Signed)

## 2022-01-27 LAB — COMPLETE METABOLIC PANEL WITH GFR
AG Ratio: 2 (calc) (ref 1.0–2.5)
ALT: 16 U/L (ref 6–29)
AST: 21 U/L (ref 10–35)
Albumin: 4.8 g/dL (ref 3.6–5.1)
Alkaline phosphatase (APISO): 82 U/L (ref 37–153)
BUN: 22 mg/dL (ref 7–25)
CO2: 29 mmol/L (ref 20–32)
Calcium: 10 mg/dL (ref 8.6–10.4)
Chloride: 101 mmol/L (ref 98–110)
Creat: 0.78 mg/dL (ref 0.50–1.05)
Globulin: 2.4 g/dL (calc) (ref 1.9–3.7)
Glucose, Bld: 94 mg/dL (ref 65–99)
Potassium: 4.5 mmol/L (ref 3.5–5.3)
Sodium: 140 mmol/L (ref 135–146)
Total Bilirubin: 0.5 mg/dL (ref 0.2–1.2)
Total Protein: 7.2 g/dL (ref 6.1–8.1)
eGFR: 83 mL/min/{1.73_m2} (ref >=60)

## 2022-01-27 LAB — CBC WITH DIFFERENTIAL/PLATELET
Absolute Monocytes: 721 cells/uL (ref 200–950)
Basophils Absolute: 57 cells/uL (ref 0–200)
Basophils Relative: 0.7 %
Eosinophils Absolute: 41 cells/uL (ref 15–500)
Eosinophils Relative: 0.5 %
HCT: 40.9 % (ref 35.0–45.0)
Hemoglobin: 14.1 g/dL (ref 11.7–15.5)
Lymphs Abs: 2333 cells/uL (ref 850–3900)
MCH: 34.1 pg — ABNORMAL HIGH (ref 27.0–33.0)
MCHC: 34.5 g/dL (ref 32.0–36.0)
MCV: 99 fL (ref 80.0–100.0)
MPV: 10.7 fL (ref 7.5–12.5)
Monocytes Relative: 8.9 %
Neutro Abs: 4949 cells/uL (ref 1500–7800)
Neutrophils Relative %: 61.1 %
Platelets: 238 10*3/uL (ref 140–400)
RBC: 4.13 10*6/uL (ref 3.80–5.10)
RDW: 12.5 % (ref 11.0–15.0)
Total Lymphocyte: 28.8 %
WBC: 8.1 10*3/uL (ref 3.8–10.8)

## 2022-01-27 LAB — LIPID PANEL
Cholesterol: 235 mg/dL — ABNORMAL HIGH (ref <200)
HDL: 111 mg/dL (ref >=50)
LDL Cholesterol (Calc): 100 mg/dL (calc) — ABNORMAL HIGH
Non-HDL Cholesterol (Calc): 124 mg/dL (calc) (ref <130)
Total CHOL/HDL Ratio: 2.1 (calc) (ref <5.0)
Triglycerides: 140 mg/dL (ref <150)

## 2022-01-27 LAB — TSH: TSH: 3.07 mIU/L (ref 0.40–4.50)

## 2022-02-04 ENCOUNTER — Encounter: Payer: Self-pay | Admitting: Nurse Practitioner

## 2022-02-25 ENCOUNTER — Other Ambulatory Visit: Payer: Self-pay | Admitting: Nurse Practitioner

## 2022-02-25 DIAGNOSIS — R232 Flushing: Secondary | ICD-10-CM

## 2022-03-07 ENCOUNTER — Other Ambulatory Visit: Payer: Self-pay | Admitting: Hematology

## 2022-03-07 DIAGNOSIS — R232 Flushing: Secondary | ICD-10-CM

## 2022-03-07 DIAGNOSIS — Z170 Estrogen receptor positive status [ER+]: Secondary | ICD-10-CM

## 2022-03-30 ENCOUNTER — Other Ambulatory Visit: Payer: Self-pay | Admitting: Nurse Practitioner

## 2022-03-30 DIAGNOSIS — R232 Flushing: Secondary | ICD-10-CM

## 2022-03-30 NOTE — Telephone Encounter (Signed)
Please see if on lexapro and effexor.  Should only be on 1 of those medications

## 2022-04-04 ENCOUNTER — Telehealth: Payer: Self-pay | Admitting: Nurse Practitioner

## 2022-04-04 NOTE — Telephone Encounter (Signed)
Pt is needing a PA for Effexor, have you received anything about this?

## 2022-04-08 ENCOUNTER — Other Ambulatory Visit: Payer: Self-pay | Admitting: Nurse Practitioner

## 2022-04-08 DIAGNOSIS — Z87891 Personal history of nicotine dependence: Secondary | ICD-10-CM

## 2022-04-08 DIAGNOSIS — R058 Other specified cough: Secondary | ICD-10-CM

## 2022-04-18 ENCOUNTER — Telehealth: Payer: Self-pay | Admitting: Nurse Practitioner

## 2022-04-18 NOTE — Telephone Encounter (Signed)
Patient states that [facility] [employer] will no longer cover Venlafaxine unless we submit a prior authorization or send in rx for an alternative. Patient would like a phone call from the nurse.

## 2022-04-18 NOTE — Telephone Encounter (Signed)
Please call pt to see what insurance has said .  She is on a higher than usual dose which may be why they want a prior authorization

## 2022-04-18 NOTE — Telephone Encounter (Signed)
Spoke with her pharmacy, since she just got it refilled 04/05/22, no need for a prior auth right now since it too early to refill. Called patient and told her when it's time to refill her RX we will wait for the prior auth and fill it out when it comes.

## 2022-04-19 ENCOUNTER — Other Ambulatory Visit: Payer: Self-pay | Admitting: Nurse Practitioner

## 2022-04-19 DIAGNOSIS — Z170 Estrogen receptor positive status [ER+]: Secondary | ICD-10-CM

## 2022-04-19 DIAGNOSIS — R232 Flushing: Secondary | ICD-10-CM

## 2022-04-25 ENCOUNTER — Other Ambulatory Visit: Payer: Self-pay | Admitting: Physician Assistant

## 2022-04-25 DIAGNOSIS — C50111 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 female breast: Secondary | ICD-10-CM

## 2022-05-09 ENCOUNTER — Other Ambulatory Visit: Payer: Self-pay | Admitting: Physician Assistant

## 2022-05-09 DIAGNOSIS — R232 Flushing: Secondary | ICD-10-CM

## 2022-05-09 MED ORDER — CLONIDINE HCL 0.2 MG PO TABS: ORAL_TABLET | ORAL | 3 refills | Status: DC

## 2022-05-22 ENCOUNTER — Other Ambulatory Visit: Payer: Self-pay | Admitting: Nurse Practitioner

## 2022-05-22 DIAGNOSIS — R232 Flushing: Secondary | ICD-10-CM

## 2022-05-23 ENCOUNTER — Other Ambulatory Visit: Payer: Self-pay | Admitting: Nurse Practitioner

## 2022-05-23 DIAGNOSIS — R232 Flushing: Secondary | ICD-10-CM

## 2022-05-23 MED ORDER — VENLAFAXINE HCL ER 75 MG PO CP24: ORAL_CAPSULE | ORAL | 0 refills | Status: DC

## 2022-05-24 ENCOUNTER — Other Ambulatory Visit: Payer: Self-pay | Admitting: Nurse Practitioner

## 2022-05-24 DIAGNOSIS — R232 Flushing: Secondary | ICD-10-CM

## 2022-05-30 ENCOUNTER — Encounter: Payer: Self-pay | Admitting: Nurse Practitioner

## 2022-05-30 ENCOUNTER — Other Ambulatory Visit: Payer: Self-pay | Admitting: Nurse Practitioner

## 2022-05-30 DIAGNOSIS — R232 Flushing: Secondary | ICD-10-CM

## 2022-06-03 ENCOUNTER — Other Ambulatory Visit: Payer: Self-pay | Admitting: Nurse Practitioner

## 2022-06-03 ENCOUNTER — Encounter: Payer: Self-pay | Admitting: Nurse Practitioner

## 2022-06-03 DIAGNOSIS — R232 Flushing: Secondary | ICD-10-CM

## 2022-06-03 DIAGNOSIS — Z170 Estrogen receptor positive status [ER+]: Secondary | ICD-10-CM

## 2022-06-03 MED ORDER — VEOZAH 45 MG PO TABS: 45.0000 mg | ORAL_TABLET | Freq: Every day | ORAL | 6 refills | Status: DC

## 2022-06-06 ENCOUNTER — Encounter: Payer: Self-pay | Admitting: Hematology

## 2022-06-07 ENCOUNTER — Other Ambulatory Visit: Payer: Self-pay | Admitting: Nurse Practitioner

## 2022-06-07 DIAGNOSIS — R232 Flushing: Secondary | ICD-10-CM

## 2022-06-07 MED ORDER — VEOZAH 45 MG PO TABS: 45.0000 mg | ORAL_TABLET | Freq: Every day | ORAL | 6 refills | Status: DC

## 2022-06-08 ENCOUNTER — Telehealth: Payer: Self-pay

## 2022-06-08 NOTE — Telephone Encounter (Signed)
Prior auth completed and submitted. 

## 2022-06-17 NOTE — Telephone Encounter (Signed)
Prior auth denied. 

## 2022-06-30 ENCOUNTER — Other Ambulatory Visit: Payer: Self-pay | Admitting: Internal Medicine

## 2022-06-30 DIAGNOSIS — J309 Allergic rhinitis, unspecified: Secondary | ICD-10-CM

## 2022-06-30 DIAGNOSIS — J45909 Unspecified asthma, uncomplicated: Secondary | ICD-10-CM

## 2022-06-30 DIAGNOSIS — L309 Dermatitis, unspecified: Secondary | ICD-10-CM

## 2022-07-07 ENCOUNTER — Encounter: Payer: Self-pay | Admitting: Internal Medicine

## 2022-07-07 ENCOUNTER — Ambulatory Visit (INDEPENDENT_AMBULATORY_CARE_PROVIDER_SITE_OTHER): Payer: Medicare Other | Admitting: Internal Medicine

## 2022-07-07 VITALS — BP 128/80 | HR 81 | Temp 97.3°F | Resp 16 | Ht 65.0 in | Wt 139.8 lb

## 2022-07-07 DIAGNOSIS — Z136 Encounter for screening for cardiovascular disorders: Secondary | ICD-10-CM | POA: Diagnosis not present

## 2022-07-07 DIAGNOSIS — E559 Vitamin D deficiency, unspecified: Secondary | ICD-10-CM | POA: Diagnosis not present

## 2022-07-07 DIAGNOSIS — E782 Mixed hyperlipidemia: Secondary | ICD-10-CM | POA: Diagnosis not present

## 2022-07-07 DIAGNOSIS — R7309 Other abnormal glucose: Secondary | ICD-10-CM | POA: Diagnosis not present

## 2022-07-07 DIAGNOSIS — Z87891 Personal history of nicotine dependence: Secondary | ICD-10-CM

## 2022-07-07 DIAGNOSIS — I10 Essential (primary) hypertension: Secondary | ICD-10-CM

## 2022-07-07 DIAGNOSIS — Z79899 Other long term (current) drug therapy: Secondary | ICD-10-CM

## 2022-07-07 DIAGNOSIS — Z1211 Encounter for screening for malignant neoplasm of colon: Secondary | ICD-10-CM

## 2022-07-07 DIAGNOSIS — Z8249 Family history of ischemic heart disease and other diseases of the circulatory system: Secondary | ICD-10-CM

## 2022-07-07 NOTE — Patient Instructions (Signed)

## 2022-07-07 NOTE — Progress Notes (Signed)
;   Annual Screening/Preventative Visit &  Comprehensive Evaluation &  Examination   Future Appointments  Date Time Provider Department  07/07/2022                   cpe  2:00 PM Lucky Cowboy, MD [facility]  09/19/2022 11:30 AM Heilingoetter, Cassandra L, PA-C [facility]  01/26/2023                 wellness  4:00 PM Raynelle Dick, NP [facility]        This very nice 67 y.o. MWF  presents for a Screening /Preventative Visit & comprehensive evaluation and management of multiple medical co-morbidities.  Patient has been followed for HTN, HLD,  Prediabetes  and Vitamin D Deficiency.                                                              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 Treated initially with Chemo per Dr Malachy Mood & now on maintenance Letrozole.            HTN predates since  2014. Patient's BP has been controlled at home and patient denies any cardiac symptoms as chest pain, palpitations, shortness of breath, dizziness or ankle swelling. Today's BP is at goal -  128/80         Patient's hyperlipidemia is controlled with diet and medications. Patient denies myalgias or other medication SE's. Last lipids were near goal :  Lab Results  Component Value Date   CHOL 235 (H) 01/26/2022   HDL 111 01/26/2022   LDLCALC 100 (H) 01/26/2022   TRIG 140 01/26/2022   CHOLHDL 2.1 01/26/2022          Patient has hx/o prediabetes (A1c 5.7% /2015)  and patient denies reactive hypoglycemic symptoms, visual blurring, diabetic polys or paresthesias. Last A1c was at goal :  Lab Results  Component Value Date   HGBA1C 5.3 07/06/2021          Finally, patient has history of Vitamin D Deficiency ("42" /2018)  and last Vitamin D was still low  (goal 70-100) :  Lab Results  Component Value Date   VD25OH 42 07/06/2021     Current Outpatient Medications  Medication Instructions   acyclovir ointment (ZOVIRAX) 5 % 1 application ,  Topical, Every  3 hours   Albuterol  HFA  inhaler 2 puffs, Inhalation, Every 6 hours PRN   aspirin EC 81 mg, Oral, Daily   clobetasol cream (TEMOVATE) 0.05 % Topical, 2 times daily   cloNIDine (CATAPRES) 0.2 MG tablet Take  1 tablet  Daily  for BP   fluticasone (FLONASE) 50 MCG/ACT nasal spray 1 spray, Each Nare, Every morning   gabapentin (NEURONTIN) 300 MG capsule TAKE 1 TO 2 CAPSULES AT BEDTIME   letrozole (FEMARA) 2.5 MG tablet TAKE 1 TABLET BY MOUTH IN THE MORNING   MAGNESIUM PO Oral, Daily   montelukast 10 MG tablet Take  1 tablet  Daily   Multiple Vitamins-Minerals (ZINC PO) Daily   olmesartan (BENICAR) 40 mg, Oral, Daily   triamcinolone cream (KENALOG) 0.1 % 1 Application, Topical, 2 times daily PRN   valACYclovir (VALTREX) 500 mg, Oral, 2 times daily, Take for 3 days  as needed.   venlafaxine XR (EFFEXOR-XR) 75 MG 24 hr capsule TAKE 1 CAPSULES  MORNING AND 1 AT NIGHT   [place] 45 mg, Oral, Daily   VITAMIN D, CHOLECALCIFEROL, PO Oral, Daily   VITAMIN E PO Oral, Daily   Zinc 50 MG TABS Oral      No Known Allergies   Past Medical History:  Diagnosis Date   BRCA1 positive    BRCA1 mutation c.2138C>G (p.Ser713*) @ [facility]   Breast cancer, right breast (HCC) dx 03/08/2016---  oncolgoist-  dr Mosetta Putt   Stage 1A (pT1, pN0, pM0)  Grade 2,  ER+, PR-, HER2 - ;  invasive ductal carcinoma s/p  bilateral mastectomies w/ sln bx's (negative) and completed chemotherapy (05-20-2016 to 09-29-2016)   Eczema    Family history of breast cancer    Family history of genetic disease carrier    paternal relatives with a BRCA mutation   Family history of ovarian cancer    History of abnormal cervical Pap smear age 20s   History of exercise intolerance 06/27/2006   normal   HSV-2 infection    Hypertension    Prediabetes 05/26/2014   Seasonal and perennial allergic rhinitis    Wears contact lenses      Health Maintenance  Topic Date Due   COVID-19 Vaccine (3 - Moderna risk series) 01/21/2020    INFLUENZA VACCINE  09/08/2022   Medicare Annual Wellness (AWV)  01/27/2023   Colonoscopy  12/22/2025   DTaP/Tdap/Td (4 - Td or Tdap) 04/28/2027   Pneumonia Vaccine 67+ Years old  Completed   DEXA SCAN  Completed   Hepatitis C Screening  Completed   Zoster Vaccines- Shingrix  Completed   HPV VACCINES  Aged Out     Immunization History  Administered Date(s) Administered   Influenza Inj Mdck Quad Pf 11/01/2018   Influenza Inj Mdck Quad With Preservative 11/22/2016, 11/23/2017   Influenza, High Dose Seasonal PF 12/30/2019, 01/12/2021, 01/26/2022   Influenza, Seasonal, Injecte, Preservative Fre 11/16/2015   Moderna Sars-Covid-2 Vaccination 04/08/2019, 12/24/2019   PNEUMOCOCCAL CONJUGATE-20 12/13/2021   Pneumococcal Conjugate-13 12/17/2018   Pneumococcal Polysaccharide-23 11/13/2014, 12/30/2019   Respiratory Syncytial Virus Vaccine,Recomb Aduvanted(Arexvy) 11/16/2021, 11/24/2021   Td 02/08/1996, 02/07/2006   Tdap 04/27/2017   Zoster Recombinat (Shingrix) 11/01/2018, 07/01/2021     Last Colon - 12/23/2015 - Dr Ewing Schlein - Recc 10 yearf/u due Nov 2027   Last MGM - 2018 - Bilat Mastectomy - Dr Malachy Mood   Last dexaBMD - 09/06/2021 - T= -0.8  - WNL     Past Surgical History:  Procedure Laterality Date   BREAST BIOPSY Right 04/13/2016   Procedure: Evacuation RIGHT Breast  Hematoma;  Surgeon: Emelia Loron, MD;  Location: [facility] OR;  Service: General;  Laterality: Right;   COLONOSCOPY     DILATION AND CURETTAGE OF UTERUS  2008   LAPAROSCOPIC SALPINGO OOPHERECTOMY Bilateral 01/09/2017   Procedure: LAPAROSCOPIC SALPINGO OOPHORECTOMY with pelvic washings;  Surgeon: Patton Salles, MD;  Location[facility];  Service: Gynecology;  Laterality: Bilateral;   MASTECTOMY W/ SENTINEL NODE BIOPSY Bilateral 04/12/2016   Procedure: BILATERAL TOTAL MASTECTOMIES  WITH RIGHT SENTINEL LYMPH NODE BIOPSY;  Surgeon: Emelia Loron, MD;  Location: [facility];  Service: General;   Laterality: Bilateral;   PORTACATH PLACEMENT Right 05/19/2016   Procedure: INSERTION PORT-A-CATH WITH [place];  Surgeon: Emelia Loron, MD;  Location: [facility] OR;  Service: General;  Laterality: Right;   TRANSTHORACIC ECHOCARDIOGRAM  05/10/2016   ef 55-60%, grade  1 diastolic dyfuntion/  trivial TR     Family History  Problem Relation Age of Onset   Polymyalgia rheumatica Mother    Atrial fibrillation Mother    Hypertension Father    Heart disease Father        Dec 69   Heart attack Father    Prostate cancer Father        Dx 80s; deceased 27   Breast cancer Cousin        Dx 44s; daughter of a paternal uncle   Ovarian cancer Paternal Aunt        Dx 28; deceased   Ovarian cancer Cousin        Dx 27s; daughter of a paternal uncle   Ovarian cancer Cousin        Dx 32; daughter of a paternal uncle   Ovarian cancer Paternal Aunt        Dx 75s.  BRCA positive   Ovarian cancer Other        pat grandfather's mother     Social History   Tobacco Use   Smoking status: Former    Packs/day: 1.00    Years: 25.00    Additional pack years: 0.00    Total pack years: 25.00    Types: Cigarettes    Quit date: 02/08/1996    Years since quitting: 26.4   Smokeless tobacco: Never  Vaping Use   Vaping Use: Never used  Substance Use Topics   Alcohol use: Yes    Alcohol/week: 14.0 standard drinks of alcohol    Types: 14 Glasses of wine per week   Drug use: No      ROS Constitutional: Denies fever, chills, weight loss/gain, headaches, insomnia,  night sweats, and change in appetite. Does c/o fatigue. Eyes: Denies redness, blurred vision, diplopia, discharge, itchy, watery eyes.  ENT: Denies discharge, congestion, post nasal drip, epistaxis, sore throat, earache, hearing loss, dental pain, Tinnitus, Vertigo, Sinus pain, snoring.  Cardio: Denies chest pain, palpitations, irregular heartbeat, syncope, dyspnea, diaphoresis, orthopnea, PND, claudication, edema Respiratory: denies cough, dyspnea, DOE,  pleurisy, hoarseness, laryngitis, wheezing.  Gastrointestinal: Denies dysphagia, heartburn, reflux, water brash, pain, cramps, nausea, vomiting, bloating, diarrhea, constipation, hematemesis, melena, hematochezia, jaundice, hemorrhoids Genitourinary: Denies dysuria, frequency, urgency, nocturia, hesitancy, discharge, hematuria, flank pain Breast: Breast lumps, nipple discharge, bleeding.  Musculoskeletal: Denies arthralgia, myalgia, stiffness, Jt. Swelling, pain, limp, and strain/sprain. Denies falls. Skin: Denies puritis, rash, hives, warts, acne, eczema, changing in skin lesion Neuro: No weakness, tremor, incoordination, spasms, paresthesia, pain Psychiatric: Denies confusion, memory loss, sensory loss. Denies Depression. Endocrine: Denies change in weight, skin, hair change, nocturia, and paresthesia, diabetic polys, visual blurring, hyper / hypo glycemic episodes.  Heme/Lymph: No excessive bleeding, bruising, enlarged lymph nodes.  Physical Exam  BP 128/80   Pulse 81   Temp (!) 97.3 F (36.3 C)   Resp 16   Ht 5\' 5"  (1.651 m)   Wt 139 lb 12.8 oz (63.4 kg)   LMP 02/07/1998 (Approximate)   SpO2 99%   BMI 23.26 kg/m   General Appearance: Well nourished, well groomed and in no apparent distress.  Eyes: PERRLA, EOMs, conjunctiva no swelling or erythema, normal fundi and vessels. Sinuses: No frontal/maxillary tenderness ENT/Mouth: EACs patent / TMs  nl. Nares clear without erythema, swelling, mucoid exudates. Oral hygiene is good. No erythema, swelling, or exudate. Tongue normal, non-obstructing. Tonsils not swollen or erythematous. Hearing normal.  Neck: Supple, thyroid not palpable. No bruits, nodes or JVD. Respiratory: Respiratory effort normal.  BS equal and clear bilateral without rales, rhonci, wheezing or stridor. Cardio: Heart sounds are normal with regular rate and rhythm and no murmurs, rubs or gallops. Peripheral pulses are normal and equal bilaterally without edema. No aortic  or femoral bruits. Chest: symmetric with normal excursions and percussion. Breasts: Surgically absent w/o signs of local recurrence.  Abdomen: Flat, soft with bowel sounds active. Nontender, no guarding, rebound, hernias, masses, or organomegaly.  Lymphatics: Non tender without lymphadenopathy.  Musculoskeletal: Full ROM all peripheral extremities, joint stability, 5/5 strength, and normal gait. Skin: Warm and dry without rashes, lesions, cyanosis, clubbing or  ecchymosis.  Neuro: Cranial nerves intact, reflexes equal bilaterally. Normal muscle tone, no cerebellar symptoms. Sensation intact.  Pysch: Alert and oriented X 3, normal affect, Insight and Judgment appropriate.    Assessment and Plan   1. Essential hypertension  - EKG 12-Lead - Urinalysis, Routine w reflex microscopic - Microalbumin / creatinine urine ratio - CBC with Differential/Platelet - COMPLETE METABOLIC PANEL WITH GFR - Magnesium - TSH  2. Hyperlipidemia, mixed  - EKG 12-Lead - Lipid panel - TSH  3. Abnormal glucose  - EKG 12-Lead - Hemoglobin A1c - Insulin, random  4. Vitamin D deficiency  - VITAMIN D 25 Hydroxy   5. Former smoker  - EKG 12-Lead  6. Screening for colorectal cancer  - POC Hemoccult Bld/Stl   7. Screening for heart disease  - EKG 12-Lead  8. FHx: heart disease  - EKG 12-Lead  9. Medication management  - Urinalysis, Routine w reflex microscopic - Microalbumin / creatinine urine ratio - CBC with Differential/Platelet - COMPLETE METABOLIC PANEL WITH GFR - Magnesium - Lipid panel - TSH - Hemoglobin A1c - Insulin, random - VITAMIN D 25 Hydroxy             Patient was counseled in prudent diet to achieve/maintain BMI less than 25 for weight control, BP monitoring, regular exercise and medications. Discussed med's effects and SE's. Screening labs and tests as requested with regular follow-up as recommended. Over 40 minutes of exam, counseling, chart review and high  complex critical decision making was performed.   Marinus Maw, MD

## 2022-07-08 ENCOUNTER — Other Ambulatory Visit: Payer: Self-pay | Admitting: Internal Medicine

## 2022-07-08 LAB — CBC WITH DIFFERENTIAL/PLATELET
Absolute Monocytes: 753 cells/uL (ref 200–950)
Basophils Absolute: 28 cells/uL (ref 0–200)
Basophils Relative: 0.4 %
Eosinophils Absolute: 78 cells/uL (ref 15–500)
Eosinophils Relative: 1.1 %
HCT: 40.6 % (ref 35.0–45.0)
Hemoglobin: 13.8 g/dL (ref 11.7–15.5)
Lymphs Abs: 1420 cells/uL (ref 850–3900)
MCH: 33.4 pg — ABNORMAL HIGH (ref 27.0–33.0)
MCHC: 34 g/dL (ref 32.0–36.0)
MCV: 98.3 fL (ref 80.0–100.0)
MPV: 10.8 fL (ref 7.5–12.5)
Monocytes Relative: 10.6 %
Neutro Abs: 4821 cells/uL (ref 1500–7800)
Neutrophils Relative %: 67.9 %
Platelets: 230 10*3/uL (ref 140–400)
RBC: 4.13 10*6/uL (ref 3.80–5.10)
RDW: 12.5 % (ref 11.0–15.0)
Total Lymphocyte: 20 %
WBC: 7.1 10*3/uL (ref 3.8–10.8)

## 2022-07-08 LAB — COMPLETE METABOLIC PANEL WITH GFR
AG Ratio: 1.6 (calc) (ref 1.0–2.5)
ALT: 13 U/L (ref 6–29)
AST: 18 U/L (ref 10–35)
Albumin: 4.5 g/dL (ref 3.6–5.1)
Alkaline phosphatase (APISO): 103 U/L (ref 37–153)
BUN: 13 mg/dL (ref 7–25)
CO2: 30 mmol/L (ref 20–32)
Calcium: 10.7 mg/dL — ABNORMAL HIGH (ref 8.6–10.4)
Chloride: 102 mmol/L (ref 98–110)
Creat: 0.77 mg/dL (ref 0.50–1.05)
Globulin: 2.8 g/dL (calc) (ref 1.9–3.7)
Glucose, Bld: 106 mg/dL — ABNORMAL HIGH (ref 65–99)
Potassium: 4.8 mmol/L (ref 3.5–5.3)
Sodium: 144 mmol/L (ref 135–146)
Total Bilirubin: 0.3 mg/dL (ref 0.2–1.2)
Total Protein: 7.3 g/dL (ref 6.1–8.1)
eGFR: 84 mL/min/{1.73_m2} (ref >=60)

## 2022-07-08 LAB — VITAMIN D 25 HYDROXY (VIT D DEFICIENCY, FRACTURES): Vit D, 25-Hydroxy: 35 ng/mL (ref 30–100)

## 2022-07-08 LAB — URINALYSIS, ROUTINE W REFLEX MICROSCOPIC
Bacteria, UA: NONE SEEN /HPF
Bilirubin Urine: NEGATIVE
Glucose, UA: NEGATIVE
Hyaline Cast: NONE SEEN /LPF
Ketones, ur: NEGATIVE
Leukocytes,Ua: NEGATIVE
Nitrite: NEGATIVE
Protein, ur: NEGATIVE
Specific Gravity, Urine: 1.023 (ref 1.001–1.035)
Squamous Epithelial / HPF: NONE SEEN /HPF (ref <=5)
WBC, UA: NONE SEEN /HPF (ref 0–5)
pH: 6 (ref 5.0–8.0)

## 2022-07-08 LAB — LIPID PANEL
Cholesterol: 235 mg/dL — ABNORMAL HIGH (ref <200)
HDL: 126 mg/dL (ref >=50)
LDL Cholesterol (Calc): 86 mg/dL (calc)
Non-HDL Cholesterol (Calc): 109 mg/dL (calc) (ref <130)
Total CHOL/HDL Ratio: 1.9 (calc) (ref <5.0)
Triglycerides: 126 mg/dL (ref <150)

## 2022-07-08 LAB — HEMOGLOBIN A1C
Hgb A1c MFr Bld: 5.7 % of total Hgb — ABNORMAL HIGH (ref <5.7)
Mean Plasma Glucose: 117 mg/dL
eAG (mmol/L): 6.5 mmol/L

## 2022-07-08 LAB — MICROALBUMIN / CREATININE URINE RATIO
Creatinine, Urine: 135 mg/dL (ref 20–275)
Microalb Creat Ratio: 16 mg/g creat (ref <30)
Microalb, Ur: 2.2 mg/dL

## 2022-07-08 LAB — INSULIN, RANDOM: Insulin: 21.7 u[IU]/mL — ABNORMAL HIGH

## 2022-07-08 LAB — MAGNESIUM: Magnesium: 1.7 mg/dL (ref 1.5–2.5)

## 2022-07-08 LAB — MICROSCOPIC MESSAGE

## 2022-07-08 LAB — TSH: TSH: 1.48 mIU/L (ref 0.40–4.50)

## 2022-07-08 NOTE — Progress Notes (Signed)
^<^<^<^<^<^<^<^<^<^<^<^<^<^<^<^<^<^<^<^<^<^<^<^<^<^<^<^<^<^<^<^<^<^<^<^<^ ^>^>^>^>^>^>^>^>^>^>^>>^>^>^>^>^>^>^>^>^>^>^>^>^>^>^>^>^>^>^>^>^>^>^>^>^>  -  Test results slightly outside the reference range are not unusual. If there is anything important, I will review this with you,  otherwise it is considered normal test values.  If you have further questions,  please do not hesitate to contact me at the office or via My Chart.   ^<^<^<^<^<^<^<^<^<^<^<^<^<^<^<^<^<^<^<^<^<^<^<^<^<^<^<^<^<^<^<^<^<^<^<^<^ ^>^>^>^>^>^>^>^>^>^>^>^>^>^>^>^>^>^>^>^>^>^>^>^>^>^>^>^>^>^>^>^>^>^>^>^>^  -   Elevated Chol of 235 is "OK",                            since have such a "Super" high level of the "Good" HDL Chol = 126  !    - Your Chol is Excellent   !  - Very low risk for Heart Attack  / Stroke  ^<^<^<^<^<^<^<^<^<^<^<^<^<^<^<^<^<^<^<^<^<^<^<^<^<^<^<^<^<^<^<^<^<^<^<^<^ ^>^>^>^>^>^>^>^>^>^>^>^>^>^>^>^>^>^>^>^>^>^>^>^>^>^>^>^>^>^>^>^>^>^>^>^>^  -   A1c = 5.7% - 12 week average blood sugar  is "borderline " elevated   So . . .    - Avoid Sweets, Candy & White Stuff   - White Rice, [place], White Flour  - Breads &  Pasta  ^<^<^<^<^<^<^<^<^<^<^<^<^<^<^<^<^<^<^<^<^<^<^<^<^<^<^<^<^<^<^<^<^<^<^<^<^ ^>^>^>^>^>^>^>^>^>^>^>^>^>^>^>^>^>^>^>^>^>^>^>^>^>^>^>^>^>^>^>^>^>^>^>^>^  -    Magnesium = 1.7  is  very  low   - goal is betw 2.0 - 2.5,   - So..............Marland Kitchen  Recommend that you take Magnesium 500 mg tablet 2 x /day with Meals   - also important to eat lots of  leafy green vegetables   - spinach - Kale - collards - greens - okra - asparagus - broccoli - quinoa - squash - almonds   - black, red, white beans  -  peas - green beans  ^<^<^<^<^<^<^<^<^<^<^<^<^<^<^<^<^<^<^<^<^<^<^<^<^<^<^<^<^<^<^<^<^<^<^<^<^ ^>^>^>^>^>^>^>^>^>^>^>^>^>^>^>^>^>^>^>^>^>^>^>^>^>^>^>^>^>^>^>^>^>^>^>^>^  -   Vit D = 35 - Still way too Low   - Vitamin D goal is between 70-100.   - Please take  Vitamin D 5,000 unit capsule  Daily !  - It is very important as a natural anti-inflammatory and helping the  immune system protect against viral infections, like the Covid-19    helping hair, skin, and nails, as well as reducing stroke and  heart attack risk.   - It helps your bones and helps with mood.  - It also decreases numerous cancer risks so please  take it as directed.   - Low Vit D is associated with a 200-300% higher risk for  CANCER   and 200-300% higher risk for HEART   ATTACK  &  STROKE.    - It is also associated with higher death rate at younger ages,   autoimmune diseases like Rheumatoid arthritis, Lupus,  Multiple Sclerosis.     - Also many other serious conditions, like depression, Alzheimer's  Dementia, infertility, muscle aches, fatigue, fibromyalgia   - just to name a few.  ^<^<^<^<^<^<^<^<^<^<^<^<^<^<^<^<^<^<^<^<^<^<^<^<^<^<^<^<^<^<^<^<^<^<^<^<^ ^>^>^>^>^>^>^>^>^>^>^>^>^>^>^>^>^>^>^>^>^>^>^>^>^>^>^>^>^>^>^>^>^>^>^>^>^  - All Else - CBC - Kidneys - Electrolytes - Liver - Magnesium & Thyroid    - all  Normal / OK  ^<^<^<^<^<^<^<^<^<^<^<^<^<^<^<^<^<^<^<^<^<^<^<^<^<^<^<^<^<^<^<^<^<^<^<^<^ ^>^>^>^>^>^>^>^>^>^>^>^>^>^>^>^>^>^>^>^>^>^>^>^>^>^>^>^>^>^>^>^>^>^>^>^>^

## 2022-07-18 ENCOUNTER — Other Ambulatory Visit: Payer: Self-pay | Admitting: Nurse Practitioner

## 2022-07-18 DIAGNOSIS — C50111 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 female breast: Secondary | ICD-10-CM

## 2022-07-18 DIAGNOSIS — R232 Flushing: Secondary | ICD-10-CM

## 2022-07-19 ENCOUNTER — Other Ambulatory Visit: Payer: Self-pay

## 2022-07-19 DIAGNOSIS — R232 Flushing: Secondary | ICD-10-CM

## 2022-07-19 MED ORDER — VENLAFAXINE HCL ER 75 MG PO CP24
ORAL_CAPSULE | ORAL | 1 refills | Status: DC
Start: 2022-07-19 — End: 2022-07-22

## 2022-07-21 ENCOUNTER — Encounter: Payer: Self-pay | Admitting: Nurse Practitioner

## 2022-07-22 ENCOUNTER — Other Ambulatory Visit: Payer: Self-pay | Admitting: Physician Assistant

## 2022-07-22 ENCOUNTER — Other Ambulatory Visit: Payer: Self-pay | Admitting: Nurse Practitioner

## 2022-07-22 DIAGNOSIS — R232 Flushing: Secondary | ICD-10-CM

## 2022-07-22 DIAGNOSIS — Z170 Estrogen receptor positive status [ER+]: Secondary | ICD-10-CM

## 2022-07-22 MED ORDER — VENLAFAXINE HCL ER 75 MG PO CP24
ORAL_CAPSULE | ORAL | 1 refills | Status: DC
Start: 2022-07-22 — End: 2022-12-30

## 2022-08-30 ENCOUNTER — Other Ambulatory Visit: Payer: Self-pay | Admitting: Hematology

## 2022-08-30 DIAGNOSIS — Z170 Estrogen receptor positive status [ER+]: Secondary | ICD-10-CM

## 2022-08-30 DIAGNOSIS — R232 Flushing: Secondary | ICD-10-CM

## 2022-09-15 NOTE — Progress Notes (Signed)
[facility] OFFICE PROGRESS NOTE  Lucky Cowboy, MD [address] [place] [address]  DIAGNOSIS: f/u of right breast cancer   Oncology History Overview Note  Cancer Staging 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 breast in female, estrogen receptor positive (HCC) Staging form: Breast, AJCC 8th Edition - Clinical stage from 03/08/2016: Stage IA (cT1c, cN0, cM0, G2, ER: Positive, PR: Negative, HER2: Negative) - Signed by Malachy Mood, MD on 03/15/2016 - Pathologic stage from 04/12/2016: Stage IA (pT1c, pN0, cM0, G2, ER: Positive, PR: Negative, HER2: Negative, Oncotype DX score: 49) - Signed by Malachy Mood, MD on 05/05/2016     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 breast in female, estrogen receptor positive (HCC)  03/04/2016 Mammogram   Diagnostic bilateral mammogram and right breast ultrasound showed a 1.7 cm mass in the 12:00 position of the right breast, highly suspicious for malignancy. There is a borderline abnormal right axillary lymph node, also suspicious for metastasis.    03/08/2016 Initial Diagnosis   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 breast in female, estrogen receptor positive (HCC)   03/08/2016 Initial Biopsy   Right breast 12:00 core needle biopsy showed invasive ductal carcinoma, grade 2, right axillary lymph node biopsy was negative.   03/08/2016 Receptors her2   ER 20% positive, PR negative, HER-2 negative, Ki-67 20%   03/30/2016 Imaging   MR Bilateral Breast 03/30/2016 IMPRESSION: Known unifocal right breast malignancy. No MRI evidence of malignancy in the left breast.   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  Genes Analyzed: 43 genes on [facility]'s Common Cancers panel (APC, ATM, AXIN2, BARD1, BMPR1A, BRCA1, BRCA2, BRIP1, CDH1, CDKN2A, CHEK2, DICER1, EPCAM, GREM1, HOXB13, KIT, MEN1, MLH1, MSH2, MSH6, MUTYH, NBN, NF1, PALB2, PDGFRA, PMS2, POLD1, POLE, PTEN, RAD50,  RAD51C, RAD51D, SDHA, SDHB, SDHC, SDHD, SMAD4, SMARCA4, STK11, TP53, TSC1, TSC2, VHL).    04/12/2016 Surgery   Bilateral mastectomy and right sentinel lymph node biopsy, by Dr. Dwain Sarna   04/12/2016 Pathology Results   Right breast mastectomy showed invasive grade 2 invasive ductal carcinoma, 1.7 cm, margins were negative, 3 sentinel lymph nodes were negative, left simple mastectomy fibroadenoma, one benign node, no atypia or tumor.    05/04/2016 Oncotype testing   Recurrence score 49, high-risk, predicts 10 year risk of distant recurrence 32% with tamoxifen alone.   05/10/2016 Echocardiogram   Echo on 05/10/16 showed EF 55-60%.   05/20/2016 - 09/29/2016 Chemotherapy   dose dense Adriamycin and Cytoxan, every 2 weeks starting 05/20/16 for 4 cycles, followed by Taxol weekly (Taxol Changed to Abaraxane 100mg /m2 on 09/01/17 due to skin rash).    10/2016 -  Anti-estrogen oral therapy    Anastrozole started in 10/2016 and stopped after 2 weeks due to side effects. Switched to Letrozole 11/30/2016    12/27/2016 Imaging   Bone Density 12/27/16  ASSESSMENT: The BMD measured at Femur Neck Right is 0.938 g/cm2 with a T-score of -0.7. This patient is considered normal according to World Health Organization [facility]) criteria.  Site Region Measured Date Measured Age YA BMD Significant CHANGE T-score DualFemur Neck Right 12/27/2016    63.0         -0.5    0.938 g/cm2  AP Spine  L1-L4      12/27/2016    63.0         0.0     1.200 g/cm2   12/27/2016 Imaging   12/27/2016 Bone Density ASSESSMENT: The BMD measured  at Femur Neck Right is 0.938 g/cm2 with a T-score of -0.7.   01/05/2017 Survivorship   Survivorship Clinic with with Loa Socks, NP     01/09/2017 Surgery   LAPAROSCOPIC SALPINGO OOPHORECTOMY with pelvic washings by Dr. Ardell Isaacs and Dr. Oscar La 01/09/17  Diagnosis Ovaries and fallopian tubes, bilateral - BENIGN OVARIES AND FALLOPIAN TUBES WITH PARATUBAL CYST - NO  MALIGNANCY IDENTIFIED    Breast cancer, right (HCC)  04/12/2016 Initial Diagnosis   Breast cancer, right (HCC)      CURRENT THERAPY: Letrozole, starting 11/2016   INTERVAL HISTORY: Chelsea Salinas 67 y.o. female returns to the clinic today for a follow up visit. She was last seen by Dr. Mosetta Putt 1 year ago. The patient is feeling fairly well today without any concerning complaints. The patient is currently undergoing treatment with letrozole. She is tolerable this fairly well except for some hot flashes which are tolerable with lexapro, effexor, and gabapentin. Her energy and appetite are normal. She is very active and goes to the gym 4-5x per week. Denies changes/concerns on her chest wall. Denies any new or concerning new bone/joint pain. She denies abdominal pain, lymphadenopathy, jaundice, or itching. She will be due for her DEXA scan in July 2025. She is scheduled to see her PCP for 6 month follow up in December 2024 and will talk to them about ordering it at that time. She is wondering how long she will be on letrozole. She has been taking this for 6 years. She is here for a 1 year follow up visit and repeat blood work.   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*) @ [facility]   Breast cancer, right breast (HCC) dx 03/08/2016---  oncolgoist-  dr Mosetta Putt   Stage 1A (pT1, pN0, pM0)  Grade 2,  ER+, PR-, HER2 - ;  invasive ductal carcinoma s/p  bilateral mastectomies w/ sln bx's (negative) and completed chemotherapy (05-20-2016 to 09-29-2016)   Eczema    Family history of breast cancer    Family history of genetic disease carrier    paternal relatives with a BRCA mutation   Family history of ovarian cancer    History of abnormal cervical Pap smear age 65s   History of exercise intolerance 06/27/2006   normal   HSV-2 infection    Hypertension    Prediabetes 05/26/2014   Seasonal and perennial allergic rhinitis    Wears  contact lenses     ALLERGIES:  has No Known Allergies.  MEDICATIONS:  Current Outpatient Medications  Medication Sig Dispense Refill   acyclovir ointment (ZOVIRAX) 5 % Apply 1 application topically every 3 (three) hours. 15 g 1   albuterol (VENTOLIN HFA) 108 (90 Base) MCG/ACT inhaler Inhale 2 puffs into the lungs every 6 (six) hours as needed for wheezing or shortness of breath. 18 g 1   aspirin EC 81 MG tablet Take 81 mg by mouth daily.     clobetasol cream (TEMOVATE) 0.05 % Apply topically 2 (two) times daily.     cloNIDine (CATAPRES) 0.2 MG tablet Take  1 tablet  Daily  for BP 90 tablet 3   fluticasone (FLONASE) 50 MCG/ACT nasal spray Place 1 spray into both nostrils every morning.      gabapentin (NEURONTIN) 300 MG capsule TAKE 1 TO 2 CAPSULES BY MOUTH AT BEDTIME 90 capsule 0   letrozole (FEMARA) 2.5 MG tablet TAKE 1 TABLET BY MOUTH IN THE  MORNING 90 tablet 0   MAGNESIUM PO Take by mouth daily.     montelukast (SINGULAIR) 10 MG tablet Take  1 tablet  Daily for Allergies                                                             /                                                     TAKE                                                          BY                                                  MOUTH                                           ONCE  ??  DAILY 90 tablet 3   olmesartan (BENICAR) 40 MG tablet Take 1 tablet (40 mg total) by mouth daily. 30 tablet 11   triamcinolone cream (KENALOG) 0.1 % Apply 1 Application topically 2 (two) times daily as needed (eczema). 453.6 g 1   valACYclovir (VALTREX) 500 MG tablet Take 1 tablet (500 mg total) by mouth 2 (two) times daily. Take for 3 days as needed. 30 tablet 1   venlafaxine XR (EFFEXOR-XR) 75 MG 24 hr capsule TAKE 2 CAPSULES BY MOUTH IN THE MORNING AND 1 AT NIGHT 270 capsule 1   VITAMIN D, CHOLECALCIFEROL, PO Take by mouth daily.     VITAMIN E PO Take by mouth daily.     Zinc 50 MG TABS Take by mouth.     No current facility-administered  medications for this visit.    SURGICAL HISTORY:  Past Surgical History:  Procedure Laterality Date   BREAST BIOPSY Right 04/13/2016   Procedure: Evacuation RIGHT Breast  Hematoma;  Surgeon: Emelia Loron, MD;  Location[facility] OR;  Service: General;  Laterality: Right;   COLONOSCOPY     DILATION AND CURETTAGE OF UTERUS  2008   LAPAROSCOPIC SALPINGO OOPHERECTOMY Bilateral 01/09/2017   Procedure: LAPAROSCOPIC SALPINGO OOPHORECTOMY with pelvic washings;  Surgeon: Patton Salles, MD;  Location: [facility];  Service: Gynecology;  Laterality: Bilateral;   MASTECTOMY W/ SENTINEL NODE BIOPSY Bilateral 04/12/2016   Procedure: BILATERAL TOTAL MASTECTOMIES  WITH RIGHT SENTINEL LYMPH NODE BIOPSY;  Surgeon: Emelia Loron, MD;  Location: [facility] OR;  Service: General;  Laterality: Bilateral;   PORTACATH PLACEMENT Right 05/19/2016   Procedure: INSERTION PORT-A-CATH WITH [place];  Surgeon: Emelia Loron, MD;  Location: [facility] OR;  Service: General;  Laterality: Right;   TRANSTHORACIC ECHOCARDIOGRAM  05/10/2016   ef 55-60%, grade 1 diastolic dyfuntion/  trivial TR    REVIEW OF SYSTEMS:   Review of Systems  Constitutional: Positive for occasional hot flashes. Negative for appetite change, chills, fatigue, fever and unexpected weight change.  HENT: Negative for mouth sores, nosebleeds, sore throat and trouble swallowing.   Eyes: Negative for eye problems and icterus.  Respiratory: Negative for cough, hemoptysis, shortness of breath and wheezing.   Cardiovascular: Negative for chest pain and leg swelling.  Gastrointestinal: Negative for abdominal pain, constipation, diarrhea, nausea and vomiting.  Genitourinary: Negative for bladder incontinence, difficulty urinating, dysuria, frequency and hematuria.   Musculoskeletal: Negative for back pain, gait problem, neck pain and neck stiffness.  Skin: Negative for itching and rash.  Neurological: Negative for dizziness, extremity weakness, gait  problem, headaches, light-headedness and seizures.  Hematological: Negative for adenopathy. Does not bruise/bleed easily.  Psychiatric/Behavioral: Negative for confusion, depression and sleep disturbance. The patient is not nervous/anxious.     PHYSICAL EXAMINATION:  Last menstrual period 02/07/1998.  ECOG PERFORMANCE STATUS: 0  Physical Exam  Constitutional: Oriented to person, place, and time and well-developed, well-nourished, and in no distress.  HENT:  Head: Normocephalic and atraumatic.  Mouth/Throat: Oropharynx is clear and moist. No oropharyngeal exudate.  Eyes: Conjunctivae are normal. Right eye exhibits no discharge. Left eye exhibits no discharge. No scleral icterus.  Neck: Normal range of motion. Neck supple.  Cardiovascular: Normal rate, regular rhythm, normal heart sounds and intact distal pulses.   Pulmonary/Chest: Effort normal and breath sounds normal. No respiratory distress. No wheezes. No rales.  Abdominal: Soft. Bowel sounds are normal. Exhibits no distension and no mass. There is no tenderness.  Musculoskeletal: Normal range of motion. Exhibits no edema.  Lymphadenopathy:    No cervical adenopathy.  Neurological: Alert and oriented to person, place, and time. Exhibits normal muscle tone. Gait normal. Coordination normal.  Skin: Skin is warm and dry. No rash noted. Not diaphoretic. No erythema. No pallor.  Psychiatric: Mood, memory and judgment normal.  Vitals reviewed.  LABORATORY DATA: Lab Results  Component Value Date   WBC 7.1 07/07/2022   HGB 13.8 07/07/2022   HCT 40.6 07/07/2022   MCV 98.3 07/07/2022   PLT 230 07/07/2022      Chemistry      Component Value Date/Time   NA 144 07/07/2022 1509   NA 140 01/25/2017 1112   K 4.8 07/07/2022 1509   K 3.7 01/25/2017 1112   CL 102 07/07/2022 1509   CO2 30 07/07/2022 1509   CO2 26 01/25/2017 1112   BUN 13 07/07/2022 1509   BUN 17.9 01/25/2017 1112   CREATININE 0.77 07/07/2022 1509   CREATININE 0.8  01/25/2017 1112      Component Value Date/Time   CALCIUM 10.7 (H) 07/07/2022 1509   CALCIUM 9.3 01/25/2017 1112   ALKPHOS 77 09/17/2021 1040   ALKPHOS 90 01/25/2017 1112   AST 18 07/07/2022 1509   AST 17 09/17/2021 1040   AST 20 01/25/2017 1112   ALT 13 07/07/2022 1509   ALT 13 09/17/2021 1040   ALT 18 01/25/2017 1112   BILITOT 0.3 07/07/2022 1509   BILITOT 0.5 09/17/2021 1040   BILITOT 0.54 01/25/2017 1112       RADIOGRAPHIC STUDIES:  No results found.   ASSESSMENT/PLAN:  ASSESSMENT/PLAN:  Chelsea Salinas is a 67 y.o. female with    1. Malignant neoplasm of central portion of  right breast, invasive ductal carcinoma, G2, Stage IA, pT1cN0M0 ER weakly positive,  PR and HER-2 negative. Oncotype RS 49, high risk  -Diagnosed in 02/2016. S/p b/l mastectomy and BSO due to her BRCA1+ mutation. She completed adjuvant chemo AC-T.  -She started antiestrogen with Anastrozole in 10/2016 and stopped after 2 weeks due to side effects. Switched to Letrozole 11/30/16. Plan for 7 years per Dr. Latanya Maudlin note. She is tolerating well with mild hot flashes. -given bilateral mastectomy, no role for mammograms  -she is clinically doing very well. Labs reviewed, WNL. Physical exam was unremarkable. There is no clinical concern for recurrence. -continue surveillance and AI. -Follow up in 1 year per Dr. Latanya Maudlin last LOS. At that time, that will be close to 7 years. I let the patient know at her next appointment they can discuss the plan moving forward as she approaches 7 years with letrozole.    2. Hot flashes, secondary to letrozole, insomnia  -stable. The hot flashes are random and hard for her to predict.  -She is on Effexor, lexapro, and Gapentin 600mg .   3. BRCA1 mutation (+) -She is aware of the high risk of breast cancer and ovarian cancer due to the BRCA1 mutation. S/p bilateral mastectomy and BSO. -does not meet criteria for pancreatic cancer screening due to no family history but  understands her slightly increased risk  -Her sister has done genetic testing, results were negative. She has no children.  -It was previously recommended to have routine f/u Gyn or PCP to continue Pap Smear regularly.    4. Bone Health -bone density has remained normal, most recent DEXA 09/06/21 showed T-score of -0.8 at right hip  -Continue VitD and calcium daily  -She is going to talk to her PCP at her December 2024 appointment about ordering her next DEXA which should be due in July 2025.     Plan: -refilled Letrozole -Labs and follow up 1 year     No orders of the defined types were placed in this encounter.    The total time spent in the appointment was 20-29 minutes   L , PA-C 09/15/22

## 2022-09-19 ENCOUNTER — Inpatient Hospital Stay: Payer: Medicare Other

## 2022-09-19 ENCOUNTER — Inpatient Hospital Stay: Payer: Medicare Other | Attending: Physician Assistant | Admitting: Physician Assistant

## 2022-09-19 ENCOUNTER — Other Ambulatory Visit: Payer: Self-pay | Admitting: Medical Oncology

## 2022-09-19 ENCOUNTER — Other Ambulatory Visit: Payer: Self-pay

## 2022-09-19 DIAGNOSIS — Z79811 Long term (current) use of aromatase inhibitors: Secondary | ICD-10-CM | POA: Insufficient documentation

## 2022-09-19 DIAGNOSIS — Z9013 Acquired absence of bilateral breasts and nipples: Secondary | ICD-10-CM | POA: Insufficient documentation

## 2022-09-19 DIAGNOSIS — Z79899 Other long term (current) drug therapy: Secondary | ICD-10-CM | POA: Diagnosis not present

## 2022-09-19 DIAGNOSIS — N951 Menopausal and female climacteric states: Secondary | ICD-10-CM | POA: Insufficient documentation

## 2022-09-19 DIAGNOSIS — C50111 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 female breast: Secondary | ICD-10-CM

## 2022-09-19 DIAGNOSIS — Z170 Estrogen receptor positive status [ER+]: Secondary | ICD-10-CM | POA: Diagnosis not present

## 2022-09-19 LAB — CBC WITH DIFFERENTIAL (CANCER CENTER ONLY)
Abs Immature Granulocytes: 0.01 10*3/uL (ref 0.00–0.07)
Basophils Absolute: 0 10*3/uL (ref 0.0–0.1)
Basophils Relative: 1 %
Eosinophils Absolute: 0.1 10*3/uL (ref 0.0–0.5)
Eosinophils Relative: 2 %
HCT: 41.3 % (ref 36.0–46.0)
Hemoglobin: 14 g/dL (ref 12.0–15.0)
Immature Granulocytes: 0 %
Lymphocytes Relative: 34 %
Lymphs Abs: 1.7 10*3/uL (ref 0.7–4.0)
MCH: 33.7 pg (ref 26.0–34.0)
MCHC: 33.9 g/dL (ref 30.0–36.0)
MCV: 99.5 fL (ref 80.0–100.0)
Monocytes Absolute: 0.6 10*3/uL (ref 0.1–1.0)
Monocytes Relative: 12 %
Neutro Abs: 2.6 10*3/uL (ref 1.7–7.7)
Neutrophils Relative %: 51 %
Platelet Count: 206 10*3/uL (ref 150–400)
RBC: 4.15 MIL/uL (ref 3.87–5.11)
RDW: 12.4 % (ref 11.5–15.5)
WBC Count: 5 10*3/uL (ref 4.0–10.5)
nRBC: 0 % (ref 0.0–0.2)

## 2022-09-19 LAB — CMP (CANCER CENTER ONLY)
ALT: 16 U/L (ref 0–44)
AST: 19 U/L (ref 15–41)
Albumin: 4.5 g/dL (ref 3.5–5.0)
Alkaline Phosphatase: 76 U/L (ref 38–126)
Anion gap: 7 (ref 5–15)
BUN: 14 mg/dL (ref 8–23)
CO2: 32 mmol/L (ref 22–32)
Calcium: 9.7 mg/dL (ref 8.9–10.3)
Chloride: 103 mmol/L (ref 98–111)
Creatinine: 0.66 mg/dL (ref 0.44–1.00)
GFR, Estimated: >60 mL/min (ref >60)
Glucose, Bld: 97 mg/dL (ref 70–99)
Potassium: 4.8 mmol/L (ref 3.5–5.1)
Sodium: 142 mmol/L (ref 135–145)
Total Bilirubin: 0.5 mg/dL (ref 0.3–1.2)
Total Protein: 7.1 g/dL (ref 6.5–8.1)

## 2022-09-19 MED ORDER — LETROZOLE 2.5 MG PO TABS
ORAL_TABLET | ORAL | 3 refills | Status: DC
Start: 2022-09-19 — End: 2023-09-19

## 2022-10-11 ENCOUNTER — Other Ambulatory Visit: Payer: Self-pay | Admitting: Nurse Practitioner

## 2022-10-11 DIAGNOSIS — C50111 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 female breast: Secondary | ICD-10-CM

## 2022-10-11 DIAGNOSIS — R232 Flushing: Secondary | ICD-10-CM

## 2022-11-27 ENCOUNTER — Other Ambulatory Visit: Payer: Self-pay | Admitting: Nurse Practitioner

## 2022-11-27 DIAGNOSIS — Z170 Estrogen receptor positive status [ER+]: Secondary | ICD-10-CM

## 2022-11-27 DIAGNOSIS — R232 Flushing: Secondary | ICD-10-CM

## 2022-11-28 ENCOUNTER — Encounter: Payer: Self-pay | Admitting: Hematology

## 2022-11-28 MED ORDER — GABAPENTIN 300 MG PO CAPS
ORAL_CAPSULE | ORAL | 0 refills | Status: DC
Start: 2022-11-28 — End: 2023-01-09

## 2022-12-30 ENCOUNTER — Other Ambulatory Visit: Payer: Self-pay | Admitting: Nurse Practitioner

## 2022-12-30 DIAGNOSIS — R232 Flushing: Secondary | ICD-10-CM

## 2023-01-07 ENCOUNTER — Other Ambulatory Visit: Payer: Self-pay | Admitting: Nurse Practitioner

## 2023-01-07 DIAGNOSIS — R232 Flushing: Secondary | ICD-10-CM

## 2023-01-07 DIAGNOSIS — Z170 Estrogen receptor positive status [ER+]: Secondary | ICD-10-CM

## 2023-01-09 ENCOUNTER — Encounter: Payer: Self-pay | Admitting: Hematology

## 2023-01-25 NOTE — Progress Notes (Unsigned)
ANNUAL MEDICARE VISIT  Assessment and Plan:  Chelsea Salinas was seen today for medicare wellness.  Diagnoses and all orders for this visit:  Encounter for Medicare annual wellness exam Due yearly  Essential hypertension - Stop losartan and start Olmesartan 40 mg 1/2 tab QHS -continue DASH diet, exercise and monitor at home. Call if greater than 130/80.   Go to the ER if any chest pain, shortness of breath, nausea, dizziness, severe HA, changes vision/speech - CBC, CMP  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 breast in female, estrogen receptor positive (HCC) Continue to follow with oncology  BRCA1 positive Continue to follow with oncology  Vitamin D deficiency Continue Vit D supplementation  Mixed hyperlipidemia -     COMPLETE METABOLIC PANEL WITH GFR -     Lipid panel - Continue diet and exercsie  Abnormal glucose Continue diet and exercise  Eczema, unspecified type -     betamethasone dipropionate 0.05 % cream; Apply topically 2 (two) times daily. Try to avoid long exposure of hands to water, monitor symptoms  Uncomplicated asthma, unspecified asthma severity, unspecified whether persistent Continue Singulair daily with Albuterol inhaler PRN -     montelukast (SINGULAIR) 10 MG tablet; Take 1 tablet Daily for Allergies  Hot flashes -     escitalopram (LEXAPRO) 20 MG tablet; Take 1 tablet (20 mg total) by mouth daily. -     venlafaxine XR (EFFEXOR-XR) 75 MG 24 hr capsule; 2 tabs in the morning and 1 tab at night Has been on this medication regimen long term, will continue to determine if we can wean off of one of these medications.  Currently with Letrozole she has been unable to be without medications  Medication management - TSH      Discussed med's effects and SE's. Screening labs and tests as requested with regular follow-up as recommended. Future Appointments  Date Time Provider Department Center  01/26/2023  4:00 PM Raynelle Dick, NP [facility] None   07/10/2023  2:00 PM Lucky Cowboy, MD [facility] None  09/19/2023  1:15 PM [facility] None  09/19/2023  1:40 PM Malachy Mood, MD [facility] None  01/30/2024  2:00 PM Raynelle Dick, NP [facility] None    HPI 67 y.o. female  presents for 3 month follow up and annual medicare visit. She has Hypertension; Asthma; Allergy; Eczema; Mixed hyperlipidemia; Other abnormal glucose (Hx of prediabetes); Vitamin D deficiency; Hot flashes; 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 breast in female, estrogen receptor positive (HCC); BRCA1 positive; Genetic testing; Breast cancer, right (HCC); Port-A-Cath in place; Scar tissue; Excessive drinking of alcohol; and Former smoker (25 pack year, quit 1998) on their problem list.  She no longer follows with Dr. Edward Jolly, GYN, had salpingooopherectomy, no recent abnormal PAP.   Her mother passed away 1 week ago, step father is also not doing well.  She is the executor of the will. She got to spend a lot of quality time with her mom prior to her passing.   She has atopic dermatitis with eczema, allergies, and asthma. She is on flonase for allergies. Hands have been having much worse eczema flare.   She was diagnosed with right breast cancer ER +, PR+ HER2 - 02/2016, following with Dr. Mosetta Putt, she is on letrozole, had double mastectomy with Dr. Dwain Sarna 04/12/2016, BRCA 1+, had BSP dec 2018.  No further mammogram required due to bilateral mastectomy. Continues on Letrozole, plan is for 5-7 years, started 2018  She is on lexapro, effexor (has been  on both and done well for many years), gabapentin and catapress for hot flashes with letrozole that help. Has missed doses and hot flashes are severe, unable to wean  BMI is There is no height or weight on file to calculate BMI., she has been working on diet and exercise. She walks 3 miles a day 5 days of the week, just rejoined the [employer], wants to do more yoga. Is also riding horses Wt Readings from Last 3  Encounters:  09/19/22 138 lb 3.2 oz (62.7 kg)  07/07/22 139 lb 12.8 oz (63.4 kg)  01/26/22 137 lb (62.1 kg)    Her blood pressure has not been controlled at home, today their BP is  . Had previously used Olmesartan with better BP control  previously but had stopped due to cost but would like to restart.  BP Readings from Last 3 Encounters:  09/19/22 (!) 161/93  07/07/22 128/80  01/26/22 122/79    She does workout, she is walking, she is riding horses, volunteering. She is officially retired.   She denies chest pain, shortness of breath, dizziness.   She is not on cholesterol medication . Her cholesterol is not at goal, atypical elevation at last visit. Has started benefiber. Limits red meat, dairy, eggs, fried foods. The cholesterol last visit was:   Lab Results  Component Value Date   CHOL 235 (H) 07/07/2022   HDL 126 07/07/2022   LDLCALC 86 07/07/2022   TRIG 126 07/07/2022   CHOLHDL 1.9 07/07/2022   She has been working on diet/exercise for glucose management, hx of prediabetes (6.7% in 2016). Last A1C in the office was:  Lab Results  Component Value Date   HGBA1C 5.7 (H) 07/07/2022    Last GFR:  Lab Results  Component Value Date   EGFR 84 07/07/2022     Patient is on Vitamin D supplement, one a day, unsure of dosage.  Lab Results  Component Value Date   VD25OH 35 07/07/2022   Herpes outbreaks are infrequent but does like to have current script if she gets a flare  Current Medications:  Current Outpatient Medications on File Prior to Visit  Medication Sig Dispense Refill   acyclovir ointment (ZOVIRAX) 5 % Apply 1 application topically every 3 (three) hours. 15 g 1   albuterol (VENTOLIN HFA) 108 (90 Base) MCG/ACT inhaler Inhale 2 puffs into the lungs every 6 (six) hours as needed for wheezing or shortness of breath. 18 g 1   aspirin EC 81 MG tablet Take 81 mg by mouth daily.     clobetasol cream (TEMOVATE) 0.05 % Apply topically 2 (two) times daily. (Patient not  taking: Reported on 09/19/2022)     cloNIDine (CATAPRES) 0.2 MG tablet Take  1 tablet  Daily  for BP 90 tablet 3   fluticasone (FLONASE) 50 MCG/ACT nasal spray Place 1 spray into both nostrils every morning.  (Patient not taking: Reported on 09/19/2022)     gabapentin (NEURONTIN) 300 MG capsule TAKE 1 TO 2 CAPSULES BY MOUTH AT BEDTIME 90 capsule 3   letrozole (FEMARA) 2.5 MG tablet TAKE 1 TABLET BY MOUTH IN THE MORNING 90 tablet 3   MAGNESIUM PO Take by mouth daily.     montelukast (SINGULAIR) 10 MG tablet Take  1 tablet  Daily for Allergies                                                             /  TAKE                                                          BY                                                  MOUTH                                           ONCE  ??  DAILY 90 tablet 3   olmesartan (BENICAR) 40 MG tablet Take 1 tablet (40 mg total) by mouth daily. 30 tablet 11   triamcinolone cream (KENALOG) 0.1 % Apply 1 Application topically 2 (two) times daily as needed (eczema). 453.6 g 1   valACYclovir (VALTREX) 500 MG tablet Take 1 tablet (500 mg total) by mouth 2 (two) times daily. Take for 3 days as needed. 30 tablet 1   venlafaxine XR (EFFEXOR-XR) 75 MG 24 hr capsule TAKE 2 CAPSULES BY MOUTH IN THE MORNING 1 AT BEDTIME 270 capsule 0   VITAMIN D, CHOLECALCIFEROL, PO Take by mouth daily.     VITAMIN E PO Take by mouth daily.     Zinc 50 MG TABS Take by mouth.     No current facility-administered medications on file prior to visit.   Health Maintenance:   Immunization History  Administered Date(s) Administered   Influenza Inj Mdck Quad Pf 11/01/2018   Influenza Inj Mdck Quad With Preservative 11/22/2016, 11/23/2017   Influenza, High Dose Seasonal PF 12/30/2019, 01/12/2021, 01/26/2022   Influenza, Seasonal, Injecte, Preservative Fre 11/16/2015   Moderna Sars-Covid-2 Vaccination 04/08/2019, 12/24/2019   PNEUMOCOCCAL CONJUGATE-20  12/13/2021   Pneumococcal Conjugate-13 12/17/2018   Pneumococcal Polysaccharide-23 11/13/2014, 12/30/2019   Respiratory Syncytial Virus Vaccine,Recomb Aduvanted(Arexvy) 11/16/2021, 11/24/2021   Td 02/08/1996, 02/07/2006   Tdap 04/27/2017   Zoster Recombinant(Shingrix) 11/01/2018, 07/01/2021   Tetanus: 2019  Pneumovax: 12/30/19 Prevnar 13: 2020 Flu vaccine: 01/12/21 shingrix: 2020 but never got 2nd one due to COVID vaccines-  COVID: completed will message dates, Gala Murdoch, 3/3   Pap: 04/2016 Dr. Edward Jolly- does not need to go back MGM: 02/2016 s/p masectomy, Dr. Parke Poisson doing manual exams  DEXA: 05/2019 NORMAL Colonoscopy: Nov 2017 Dr. Ewing Schlein 10 years, due 2027 EGD: N/A  Last eye: Dr. [place], 2023 Last dental: Dr. Christell Constant, 2023  Patient Care Team: Lucky Cowboy, MD as PCP - General (Internal Medicine) Axel Filler, Larna Daughters, NP as Nurse Practitioner (Hematology and Oncology) Malachy Mood, MD as Consulting Physician (Hematology) Emelia Loron, MD as Consulting Physician (General Surgery)   Medical History:  Past Medical History:  Diagnosis Date   BRCA1 positive    BRCA1 mutation c.2138C>G (p.Ser713*) @ [facility]   Breast cancer, right breast (HCC) dx 03/08/2016---  oncolgoist-  dr Mosetta Putt   Stage 1A (pT1, pN0, pM0)  Grade 2,  ER+, PR-, HER2 - ;  invasive ductal carcinoma s/p  bilateral mastectomies w/ sln bx's (negative) and completed chemotherapy (05-20-2016 to 09-29-2016)   Eczema    Family history of breast cancer    Family history of genetic disease  carrier    paternal relatives with a BRCA mutation   Family history of ovarian cancer    History of abnormal cervical Pap smear age 45s   History of exercise intolerance 06/27/2006   normal   HSV-2 infection    Hypertension    Prediabetes 05/26/2014   Seasonal and perennial allergic rhinitis    Wears contact lenses    Allergies No Known Allergies  SURGICAL HISTORY She  has a past surgical history that includes Dilation and  curettage of uterus (2008); Mastectomy w/ sentinel node biopsy (Bilateral, 04/12/2016); Breast biopsy (Right, 04/13/2016); Colonoscopy; Portacath placement (Right, 05/19/2016); transthoracic echocardiogram (05/10/2016); and Laparoscopic salpingo oophorectomy (Bilateral, 01/09/2017). FAMILY HISTORY Her family history includes Atrial fibrillation in her mother; Breast cancer in her cousin; Heart attack in her father; Heart disease in her father; Hypertension in her father; Ovarian cancer in her cousin, cousin, paternal aunt, paternal aunt, and another family member; Polymyalgia rheumatica in her mother; Prostate cancer in her father. SOCIAL HISTORY She  reports that she quit smoking about 26 years ago. Her smoking use included cigarettes. She started smoking about 51 years ago. She has a 25 pack-year smoking history. She has never used smokeless tobacco. She reports current alcohol use of about 14.0 standard drinks of alcohol per week. She reports that she does not use drugs.  MEDICARE WELLNESS OBJECTIVES: Physical activity:   Cardiac risk factors:   Depression/mood screen:      07/07/2022   12:28 AM  Depression screen PHQ 2/9  Decreased Interest 0  Down, Depressed, Hopeless 0  PHQ - 2 Score 0    ADLs:     07/07/2022   12:28 AM 01/26/2022    3:55 PM  In your present state of health, do you have any difficulty performing the following activities:  Hearing? 0 0  Vision? 0 0  Difficulty concentrating or making decisions? 0 0  Walking or climbing stairs? 0 0  Dressing or bathing? 0 0  Doing errands, shopping? 0 0      Cognitive Testing  Alert? Yes  Normal Appearance?Yes  Oriented to person? Yes  Place? Yes   Time? Yes  Recall of three objects?  Yes  Can perform simple calculations? Yes  Displays appropriate judgment?Yes  Can read the correct time from a watch face?Yes  EOL planning:     Review of Systems  Constitutional:  Negative for malaise/fatigue and weight loss.       Hot  flashes  HENT:  Negative for hearing loss and tinnitus.   Eyes:  Negative for blurred vision and double vision.  Respiratory:  Negative for cough, sputum production, shortness of breath and wheezing.   Cardiovascular:  Negative for chest pain, palpitations, orthopnea, claudication, leg swelling and PND.  Gastrointestinal:  Negative for abdominal pain, blood in stool, constipation, diarrhea, heartburn, melena, nausea and vomiting.  Genitourinary: Negative.   Musculoskeletal:  Negative for falls, joint pain and myalgias.  Skin:  Negative for rash.       Eczema of hands  Neurological:  Negative for dizziness, tingling, sensory change, weakness and headaches.  Endo/Heme/Allergies:  Negative for polydipsia.  Psychiatric/Behavioral: Negative.  Negative for depression, memory loss, substance abuse and suicidal ideas. The patient is not nervous/anxious and does not have insomnia.   All other systems reviewed and are negative.    Physical Exam: Estimated body mass index is 23 kg/m as calculated from the following:   Height as of 07/07/22: 5\' 5"  (1.651 m).   Weight as  of 09/19/22: 138 lb 3.2 oz (62.7 kg). LMP 02/07/1998 (Approximate)  General Appearance: Well nourished, in no apparent distress. Eyes: PERRLA, EOMs, conjunctiva no swelling or erythema Sinuses: No Frontal/maxillary tenderness ENT/Mouth: Ext aud canals clear, normal light reflex with TMs without erythema, bulging.  Good dentition. No erythema, swelling, or exudate on post pharynx. Tonsils not swollen or erythematous. Hearing normal.  Neck: Supple, thyroid normal. No bruits Respiratory: Respiratory effort normal, BS equal bilaterally without rales, rhonchi, wheezing or stridor. Cardio: RRR without murmurs, rubs or gallops. Brisk peripheral pulses without edema.  Chest: symmetric, with normal excursions and percussion. Breasts: defer Abdomen: Soft, +BS. Non tender, no guarding, rebound, hernias, masses, or organomegaly. .  Lymphatics:  Non tender without lymphadenopathy.  Genitourinary: defer Musculoskeletal: Full ROM all peripheral extremities,5/5 strength, and normal gait. Skin:  Warm, dry. Scaling dry patches on hands bilaterally Neuro: Cranial nerves intact, reflexes equal bilaterally. Normal muscle tone, no cerebellar symptoms. Sensation intact.  Psych: Awake and oriented X 3, normal affect, Insight and Judgment appropriate.    Medicare Attestation I have personally reviewed: The patient's medical and social history Their use of alcohol, tobacco or illicit drugs Their current medications and supplements The patient's functional ability including ADLs,fall risks, home safety risks, cognitive, and hearing and visual impairment Diet and physical activities Evidence for depression or mood disorders  The patient's weight, height, BMI, and visual acuity have been recorded in the chart.  I have made referrals, counseling, and provided education to the patient based on review of the above and I have provided the patient with a written personalized care plan for preventive services.    Revonda Humphrey ANP-C  [facility]  01/25/2023

## 2023-01-26 ENCOUNTER — Other Ambulatory Visit: Payer: Self-pay | Admitting: Nurse Practitioner

## 2023-01-26 ENCOUNTER — Ambulatory Visit (INDEPENDENT_AMBULATORY_CARE_PROVIDER_SITE_OTHER): Payer: Medicare Other | Admitting: Nurse Practitioner

## 2023-01-26 ENCOUNTER — Encounter: Payer: Self-pay | Admitting: Nurse Practitioner

## 2023-01-26 VITALS — BP 132/72 | HR 86 | Temp 97.5°F | Ht 65.0 in | Wt 141.2 lb

## 2023-01-26 DIAGNOSIS — R232 Flushing: Secondary | ICD-10-CM

## 2023-01-26 DIAGNOSIS — Z23 Encounter for immunization: Secondary | ICD-10-CM

## 2023-01-26 DIAGNOSIS — Z0001 Encounter for general adult medical examination with abnormal findings: Secondary | ICD-10-CM | POA: Diagnosis not present

## 2023-01-26 DIAGNOSIS — I10 Essential (primary) hypertension: Secondary | ICD-10-CM

## 2023-01-26 DIAGNOSIS — Z0000 Encounter for general adult medical examination without abnormal findings: Secondary | ICD-10-CM

## 2023-01-26 DIAGNOSIS — R6889 Other general symptoms and signs: Secondary | ICD-10-CM | POA: Diagnosis not present

## 2023-01-26 DIAGNOSIS — Z87891 Personal history of nicotine dependence: Secondary | ICD-10-CM

## 2023-01-26 DIAGNOSIS — E782 Mixed hyperlipidemia: Secondary | ICD-10-CM | POA: Diagnosis not present

## 2023-01-26 DIAGNOSIS — Z1501 Genetic susceptibility to malignant neoplasm of breast: Secondary | ICD-10-CM

## 2023-01-26 DIAGNOSIS — E559 Vitamin D deficiency, unspecified: Secondary | ICD-10-CM | POA: Diagnosis not present

## 2023-01-26 DIAGNOSIS — J45909 Unspecified asthma, uncomplicated: Secondary | ICD-10-CM

## 2023-01-26 DIAGNOSIS — R7309 Other abnormal glucose: Secondary | ICD-10-CM

## 2023-01-26 DIAGNOSIS — C50911 Malignant neoplasm of unspecified site of right female breast: Secondary | ICD-10-CM | POA: Diagnosis not present

## 2023-01-26 DIAGNOSIS — J309 Allergic rhinitis, unspecified: Secondary | ICD-10-CM

## 2023-01-26 DIAGNOSIS — Z79899 Other long term (current) drug therapy: Secondary | ICD-10-CM

## 2023-01-26 DIAGNOSIS — L309 Dermatitis, unspecified: Secondary | ICD-10-CM

## 2023-01-26 DIAGNOSIS — B009 Herpesviral infection, unspecified: Secondary | ICD-10-CM

## 2023-01-26 MED ORDER — VEOZAH 45 MG PO TABS: 45.0000 mg | ORAL_TABLET | Freq: Every day | ORAL | 6 refills | Status: DC

## 2023-01-26 NOTE — Patient Instructions (Signed)

## 2023-01-27 ENCOUNTER — Encounter: Payer: Self-pay | Admitting: Nurse Practitioner

## 2023-01-27 LAB — CBC WITH DIFFERENTIAL/PLATELET
Absolute Lymphocytes: 1896 {cells}/uL (ref 850–3900)
Absolute Monocytes: 710 {cells}/uL (ref 200–950)
Basophils Absolute: 50 {cells}/uL (ref 0–200)
Basophils Relative: 0.7 %
Eosinophils Absolute: 50 {cells}/uL (ref 15–500)
Eosinophils Relative: 0.7 %
HCT: 40 % (ref 35.0–45.0)
Hemoglobin: 13.3 g/dL (ref 11.7–15.5)
MCH: 32.8 pg (ref 27.0–33.0)
MCHC: 33.3 g/dL (ref 32.0–36.0)
MCV: 98.5 fL (ref 80.0–100.0)
MPV: 10.9 fL (ref 7.5–12.5)
Monocytes Relative: 10 %
Neutro Abs: 4395 {cells}/uL (ref 1500–7800)
Neutrophils Relative %: 61.9 %
Platelets: 224 10*3/uL (ref 140–400)
RBC: 4.06 10*6/uL (ref 3.80–5.10)
RDW: 12.8 % (ref 11.0–15.0)
Total Lymphocyte: 26.7 %
WBC: 7.1 10*3/uL (ref 3.8–10.8)

## 2023-01-27 LAB — COMPLETE METABOLIC PANEL WITH GFR
AG Ratio: 1.9 (calc) (ref 1.0–2.5)
ALT: 14 U/L (ref 6–29)
AST: 18 U/L (ref 10–35)
Albumin: 4.5 g/dL (ref 3.6–5.1)
Alkaline phosphatase (APISO): 88 U/L (ref 37–153)
BUN: 20 mg/dL (ref 7–25)
CO2: 30 mmol/L (ref 20–32)
Calcium: 9.8 mg/dL (ref 8.6–10.4)
Chloride: 101 mmol/L (ref 98–110)
Creat: 0.71 mg/dL (ref 0.50–1.05)
Globulin: 2.4 g/dL (ref 1.9–3.7)
Glucose, Bld: 89 mg/dL (ref 65–99)
Potassium: 4.6 mmol/L (ref 3.5–5.3)
Sodium: 140 mmol/L (ref 135–146)
Total Bilirubin: 0.4 mg/dL (ref 0.2–1.2)
Total Protein: 6.9 g/dL (ref 6.1–8.1)
eGFR: 92 mL/min/{1.73_m2} (ref >=60)

## 2023-01-27 LAB — LIPID PANEL
Cholesterol: 235 mg/dL — ABNORMAL HIGH (ref <200)
HDL: 109 mg/dL (ref >=50)
LDL Cholesterol (Calc): 92 mg/dL
Non-HDL Cholesterol (Calc): 126 mg/dL (ref <130)
Total CHOL/HDL Ratio: 2.2 (calc) (ref <5.0)
Triglycerides: 246 mg/dL — ABNORMAL HIGH (ref <150)

## 2023-01-27 LAB — MAGNESIUM: Magnesium: 2 mg/dL (ref 1.5–2.5)

## 2023-01-27 NOTE — Telephone Encounter (Signed)
Can we try prior auth?

## 2023-02-03 ENCOUNTER — Other Ambulatory Visit: Payer: Self-pay | Admitting: Nurse Practitioner

## 2023-02-03 ENCOUNTER — Encounter: Payer: Self-pay | Admitting: Nurse Practitioner

## 2023-02-03 DIAGNOSIS — R232 Flushing: Secondary | ICD-10-CM

## 2023-02-03 MED ORDER — VEOZAH 45 MG PO TABS: 45.0000 mg | ORAL_TABLET | Freq: Every day | ORAL | 6 refills | Status: DC

## 2023-02-14 NOTE — Telephone Encounter (Signed)
 Can you please call [facility] for this patient to see if they can get her approved for a better price for [place].  It was approved with prior auth but was still like $285

## 2023-02-15 ENCOUNTER — Other Ambulatory Visit: Payer: Self-pay

## 2023-02-15 DIAGNOSIS — R232 Flushing: Secondary | ICD-10-CM

## 2023-02-15 MED ORDER — VEOZAH 45 MG PO TABS: 45.0000 mg | ORAL_TABLET | Freq: Every day | ORAL | 6 refills | Status: AC

## 2023-03-02 ENCOUNTER — Other Ambulatory Visit: Payer: Self-pay | Admitting: Nurse Practitioner

## 2023-03-02 DIAGNOSIS — I10 Essential (primary) hypertension: Secondary | ICD-10-CM

## 2023-03-28 ENCOUNTER — Other Ambulatory Visit: Payer: Self-pay

## 2023-03-28 DIAGNOSIS — R232 Flushing: Secondary | ICD-10-CM

## 2023-03-28 MED ORDER — VENLAFAXINE HCL ER 75 MG PO CP24: ORAL_CAPSULE | ORAL | 0 refills | Status: DC

## 2023-03-29 ENCOUNTER — Ambulatory Visit (HOSPITAL_COMMUNITY)
Admission: EM | Admit: 2023-03-29 | Discharge: 2023-03-29 | Disposition: A | Discharge status: Home / Self Care | Payer: Medicare Other | Attending: Internal Medicine | Admitting: Internal Medicine

## 2023-03-29 ENCOUNTER — Ambulatory Visit: Payer: Medicare Other | Admitting: Family Medicine

## 2023-03-29 ENCOUNTER — Telehealth: Payer: Medicare Other

## 2023-03-29 ENCOUNTER — Ambulatory Visit: Payer: Self-pay | Admitting: Internal Medicine

## 2023-03-29 ENCOUNTER — Encounter (HOSPITAL_COMMUNITY): Payer: Self-pay | Admitting: Emergency Medicine

## 2023-03-29 DIAGNOSIS — L301 Dyshidrosis [pompholyx]: Secondary | ICD-10-CM

## 2023-03-29 MED ORDER — METHYLPREDNISOLONE SODIUM SUCC 125 MG IJ SOLR
60.0000 mg | Freq: Once | INTRAMUSCULAR | Status: AC
  Administered 2023-03-29: 60 mg via INTRAMUSCULAR

## 2023-03-29 MED ORDER — METHYLPREDNISOLONE 4 MG PO TBPK: ORAL_TABLET | ORAL | 0 refills | Status: AC

## 2023-03-29 MED ORDER — METHYLPREDNISOLONE SODIUM SUCC 125 MG IJ SOLR
INTRAMUSCULAR | Status: AC
Start: 2023-03-29 — End: ?
  Filled 2023-03-29: qty 2

## 2023-03-29 NOTE — ED Triage Notes (Signed)
Patient presents with rash on bilateral hands x 1 week.

## 2023-03-29 NOTE — Telephone Encounter (Signed)
  Chief Complaint: eczema flare up Symptoms:  itching, bleeding Frequency: 5-6 days Pertinent Negatives: Patient denies pain, fever Disposition: [] ED /[x] Urgent Care (no appt availability in office) / [] Appointment(In office/virtual)/ []  [facility]/ [] Home Care/ [] Refused Recommended Disposition /[] [facility]/ []  Follow-up with PCP Additional Notes: Patient calls reporting eczema flare up for 5-6 days on both palms and fingers. States she is using prescription medication with no relief, feels she needs a steroid. Per protocol, patient to be evaluated within 4 hours. Patient was scheduled for appt today, cancelled due to weather. Patient scheduled with first available VV for today at 1415. Care advice reviewed, patient verbalized understanding and denies further questions at this time.     Copied from CRM [id number]. Topic: Clinical - Red Word Triage >> Mar 29, 2023 11:12 AM Florestine Avers wrote: Red Word that prompted transfer to Nurse Triage: Patient is having a really bad eczema flare up. Patietn states that her skin is cracking, bleeding and very painful. Patient is requesting to be seen or speak to a nurse as soon as possible. I tried to schedule appointment but it was cancelled by the office saying they are only doing virtual visits. Reason for Disposition  [1] Looks infected (spreading redness, pus) AND [2] diabetes mellitus or weak immune system (e.g., HIV positive, cancer chemo, splenectomy, organ transplant, chronic steroids)  Answer Assessment - Initial Assessment Questions 1. APPEARANCE of RASH: "Describe the rash."      Eczema, blisters, cracking and bleeding.  2. LOCATION: "Where is the rash located?"      Palms of hands, fingers 3. NUMBER: "How many spots are there?"      Multiple 4. SIZE: "How big are the spots?" (Inches, centimeters or compare to size of a coin)      Entire hands 5. ONSET: "When did the rash start?"      5-6 days ago 6. ITCHING: "Does the  rash itch?" If Yes, ask: "How bad is the itch?"  (Scale 0-10; or none, mild, moderate, severe)     10/10 7. PAIN: "Does the rash hurt?" If Yes, ask: "How bad is the pain?"  (Scale 0-10; or none, mild, moderate, severe)    - NONE (0): no pain    - MILD (1-3): doesn't interfere with normal activities     - MODERATE (4-7): interferes with normal activities or awakens from sleep     - SEVERE (8-10): excruciating pain, unable to do any normal activities     Denies 8. OTHER SYMPTOMS: "Do you have any other symptoms?" (e.g., fever)     Bleeding  Protocols used: Rash or Redness - Localized-A-AH

## 2023-03-29 NOTE — Discharge Instructions (Signed)
Keep follow up with your dematologist

## 2023-03-29 NOTE — ED Provider Notes (Signed)
[facility]    CSN: [id number] Arrival date & time: 03/29/23  1211      History   Chief Complaint Chief Complaint  Patient presents with   Rash    HPI Chelsea Salinas is a 68 y.o. female who presents with sever flair of chronic dishydrosis x 1 week. She does not know what provoked this flair since she has not touched detergents or citrus foods which have provoked this. Has had this since her 30's. Her PCP did and new PCP she was going to meet today cancel her appointment today due to snow. Her hands are very itchy  and has been keeping her up at night.She rarely gets this bad, but in the past she responded to a steroid shot and prednisone pack.     Past Medical History:  Diagnosis Date   BRCA1 positive    BRCA1 mutation c.2138C>G (p.Ser713*) @ [facility]   Breast cancer, right breast (HCC) dx 03/08/2016---  oncolgoist-  dr Mosetta Putt   Stage 1A (pT1, pN0, pM0)  Grade 2,  ER+, PR-, HER2 - ;  invasive ductal carcinoma s/p  bilateral mastectomies w/ sln bx's (negative) and completed chemotherapy (05-20-2016 to 09-29-2016)   Eczema    Family history of breast cancer    Family history of genetic disease carrier    paternal relatives with a BRCA mutation   Family history of ovarian cancer    History of abnormal cervical Pap smear age 55s   History of exercise intolerance 06/27/2006   normal   HSV-2 infection    Hypertension    Prediabetes 05/26/2014   Seasonal and perennial allergic rhinitis    Wears contact lenses     Patient Active Problem List   Diagnosis Date Noted   Excessive drinking of alcohol 06/29/2020   Former smoker (25 pack year, quit 1998) 06/29/2020   Scar tissue 01/10/2018   Port-A-Cath in place 11/08/2016   Breast cancer, right (HCC) 04/12/2016   Genetic testing 04/08/2016   BRCA1 positive    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 breast in female, estrogen receptor positive (HCC) 03/15/2016   Vitamin D deficiency 05/18/2015   Hot flashes  05/18/2015   Other abnormal glucose (Hx of prediabetes) 05/26/2014   Mixed hyperlipidemia 01/23/2013   Hypertension    Asthma    Allergy    Eczema     Past Surgical History:  Procedure Laterality Date   BREAST BIOPSY Right 04/13/2016   Procedure: Evacuation RIGHT Breast  Hematoma;  Surgeon: Emelia Loron, MD;  Location: [facility] OR;  Service: General;  Laterality: Right;   COLONOSCOPY     DILATION AND CURETTAGE OF UTERUS  2008   LAPAROSCOPIC SALPINGO OOPHERECTOMY Bilateral 01/09/2017   Procedure: LAPAROSCOPIC SALPINGO OOPHORECTOMY with pelvic washings;  Surgeon: Patton Salles, MD;  Location: [facility];  Service: Gynecology;  Laterality: Bilateral;   MASTECTOMY W/ SENTINEL NODE BIOPSY Bilateral 04/12/2016   Procedure: BILATERAL TOTAL MASTECTOMIES  WITH RIGHT SENTINEL LYMPH NODE BIOPSY;  Surgeon: Emelia Loron, MD;  Location: [facility] OR;  Service: General;  Laterality: Bilateral;   PORTACATH PLACEMENT Right 05/19/2016   Procedure: INSERTION PORT-A-CATH WITH [place];  Surgeon: Emelia Loron, MD;  Location: [facility] OR;  Service: General;  Laterality: Right;   TRANSTHORACIC ECHOCARDIOGRAM  05/10/2016   ef 55-60%, grade 1 diastolic dyfuntion/  trivial TR    OB History     Gravida  1   Para      Term  Preterm      AB  1   Living  0      SAB      IAB      Ectopic      Multiple      Live Births               Home Medications    Prior to Admission medications   Medication Sig Start Date End Date Taking? Authorizing Provider  methylPREDNISolone (MEDROL DOSEPAK) 4 MG TBPK tablet As directed per pack 03/30/23  Yes Rodriguez-Southworth, Viviana Simpler  aspirin EC 81 MG tablet Take 81 mg by mouth daily.    [provider]  cloNIDine (CATAPRES) 0.2 MG tablet Take  1 tablet  Daily  for BP 05/09/22   Heilingoetter, Cassandra L, PA-C  Fezolinetant (VEOZAH) 45 MG TABS Take 1 tablet (45 mg total) by mouth daily. 02/15/23   Raynelle Dick, NP   gabapentin (NEURONTIN) 300 MG capsule TAKE 1 TO 2 CAPSULES BY MOUTH AT BEDTIME 01/09/23   Carlean Jews, NP  letrozole (FEMARA) 2.5 MG tablet TAKE 1 TABLET BY MOUTH IN THE MORNING 09/19/22   Heilingoetter, Cassandra L, PA-C  MAGNESIUM PO Take by mouth daily.    [provider]  montelukast (SINGULAIR) 10 MG tablet Take  1 tablet  Daily for Allergies                                                             /                                                     TAKE                                                          BY                                                  MOUTH                                           ONCE  ??  DAILY 06/30/22   Lucky Cowboy, MD  olmesartan (BENICAR) 40 MG tablet Take 1 tablet by mouth once daily 03/06/23   Adela Glimpse, NP  venlafaxine XR (EFFEXOR-XR) 75 MG 24 hr capsule TAKE 2 CAPSULES BY MOUTH IN THE MORNING 1 AT BEDTIME 03/28/23   Worthy Rancher B, FNP  VITAMIN D, CHOLECALCIFEROL, PO Take by mouth daily.    [provider]  VITAMIN E PO Take by mouth daily.    [provider]  Zinc 50 MG TABS Take by mouth.    [provider]    Family History Family  History  Problem Relation Age of Onset   Polymyalgia rheumatica Mother    Atrial fibrillation Mother    Hypertension Father    Heart disease Father        Dec 69   Heart attack Father    Prostate cancer Father        Dx 64s; deceased 57   Breast cancer Cousin        Dx 64s; daughter of a paternal uncle   Ovarian cancer Paternal Aunt        Dx 36; deceased   Ovarian cancer Cousin        Dx 7s; daughter of a paternal uncle   Ovarian cancer Cousin        Dx 62; daughter of a paternal uncle   Ovarian cancer Paternal Aunt        Dx 41s.  BRCA positive   Ovarian cancer Other        pat grandfather's mother    Social History Social History   Tobacco Use   Smoking status: Former    Current packs/day: 0.00    Average packs/day: 1 pack/day for 25.0 years  (25.0 ttl pk-yrs)    Types: Cigarettes    Start date: 02/08/1971    Quit date: 02/08/1996    Years since quitting: 27.1   Smokeless tobacco: Never  Vaping Use   Vaping status: Never Used  Substance Use Topics   Alcohol use: Yes    Alcohol/week: 14.0 standard drinks of alcohol    Types: 14 Glasses of wine per week   Drug use: No     Allergies   Patient has no known allergies.   Review of Systems Review of Systems As noted in HPI  Physical Exam Triage Vital Signs ED Triage Vitals  Encounter Vitals Group     BP 03/29/23 1320 (!) 187/90     Systolic BP Percentile --      Diastolic BP Percentile --      Pulse Rate 03/29/23 1320 70     Resp 03/29/23 1320 18     Temp 03/29/23 1320 98 F (36.7 C)     Temp Source 03/29/23 1320 Oral     SpO2 03/29/23 1320 97 %     Weight --      Height --      Head Circumference --      Peak Flow --      Pain Score 03/29/23 1323 0     Pain Loc --      Pain Education --      Exclude from Growth Chart --    No data found.  Updated Vital Signs BP (!) 187/90 (BP Location: Left Arm)   Pulse 70   Temp 98 F (36.7 C) (Oral)   Resp 18   LMP 02/07/1998 (Approximate)   SpO2 97%   Visual Acuity Right Eye Distance:   Left Eye Distance:   Bilateral Distance:    Right Eye Near:   Left Eye Near:    Bilateral Near:     Physical Exam Vitals and nursing note reviewed.  Constitutional:      General: She is not in acute distress.    Appearance: She is normal weight. She is not toxic-appearing.  HENT:     Right Ear: External ear normal.     Left Ear: External ear normal.  Eyes:     General: No scleral icterus.    Conjunctiva/sclera: Conjunctivae normal.  Pulmonary:  Effort: Pulmonary effort is normal.  Musculoskeletal:     Cervical back: Neck supple.  Skin:    General: Skin is warm and dry.     Findings: Rash present.     Comments: Both palms have thick skin, dry and edges of fingers with dryness and few cracks, but does not show  signs of infection.   Neurological:     Mental Status: She is alert and oriented to person, place, and time.     Gait: Gait normal.  Psychiatric:        Mood and Affect: Mood normal.        Thought Content: Thought content normal.        Judgment: Judgment normal.      UC Treatments / Results  Labs (all labs ordered are listed, but only abnormal results are displayed) Labs Reviewed - No data to display  EKG   Radiology No results found.  Procedures Procedures (including critical care time)  Medications Ordered in UC Medications  methylPREDNISolone sodium succinate (SOLU-MEDROL) 125 mg/2 mL injection 60 mg (60 mg Intramuscular Given 03/29/23 1406)    Initial Impression / Assessment and Plan / UC Course  I have reviewed the triage vital signs and the nursing notes.  Recurrent Dyshydrosis  She was given Solumedrol shot 60 mg IM and will start Medrol tomorrow as directed per pack. .    Final Clinical Impressions(s) / UC Diagnoses   Final diagnoses:  Dyshydrosis     Discharge Instructions      Keep follow up with your dematologist    ED Prescriptions     Medication Sig Dispense Auth. Provider   methylPREDNISolone (MEDROL DOSEPAK) 4 MG TBPK tablet As directed per pack 21 tablet Rodriguez-Southworth, Nettie Elm, PA-C      PDMP not reviewed this encounter.   Garey Ham, PA-C 03/29/23 1524

## 2023-04-05 ENCOUNTER — Other Ambulatory Visit: Payer: Self-pay

## 2023-04-05 DIAGNOSIS — I10 Essential (primary) hypertension: Secondary | ICD-10-CM

## 2023-04-05 MED ORDER — OLMESARTAN MEDOXOMIL 40 MG PO TABS: 40.0000 mg | ORAL_TABLET | Freq: Every day | ORAL | 3 refills | Status: DC

## 2023-04-10 ENCOUNTER — Other Ambulatory Visit: Payer: Self-pay

## 2023-04-10 DIAGNOSIS — I10 Essential (primary) hypertension: Secondary | ICD-10-CM

## 2023-04-10 MED ORDER — OLMESARTAN MEDOXOMIL 40 MG PO TABS: 40.0000 mg | ORAL_TABLET | Freq: Every day | ORAL | 1 refills | Status: AC

## 2023-04-10 NOTE — Telephone Encounter (Signed)
 Copied from CRM [id number]. Topic: Clinical - Prescription Issue  >> Apr 06, 2023  3:52 PM [place] E wrote: Patient called in regarding her olmesartan (BENICAR) 40 MG tablet medication. Patient states that she is out of this medication and needed her provider to sign off on it. Her previous provider was Dr. Oneta Rack, who passed away.

## 2023-04-19 ENCOUNTER — Encounter: Payer: Self-pay | Admitting: Hematology

## 2023-04-20 ENCOUNTER — Other Ambulatory Visit: Payer: Self-pay | Admitting: Nurse Practitioner

## 2023-04-20 DIAGNOSIS — R232 Flushing: Secondary | ICD-10-CM

## 2023-04-20 MED ORDER — CLONIDINE HCL 0.2 MG PO TABS: ORAL_TABLET | ORAL | 3 refills | Status: AC

## 2023-04-27 NOTE — Progress Notes (Signed)
 Patient seen by Olam Schultze [facility] jas

## 2023-06-21 ENCOUNTER — Other Ambulatory Visit: Payer: Self-pay | Admitting: Family

## 2023-06-21 DIAGNOSIS — R232 Flushing: Secondary | ICD-10-CM

## 2023-06-22 ENCOUNTER — Other Ambulatory Visit: Payer: Self-pay | Admitting: Internal Medicine

## 2023-06-22 DIAGNOSIS — R232 Flushing: Secondary | ICD-10-CM

## 2023-06-22 NOTE — Telephone Encounter (Unsigned)
 Copied from CRM [id number]. Topic: Clinical - Medication Refill >> Jun 22, 2023  4:52 PM Bridgette Campus T wrote: Medication: venlafaxine  XR (EFFEXOR -XR) 75 MG 24 hr capsule  Has the patient contacted their pharmacy? Yes (Agent: If no, request that the patient contact the pharmacy for the refill. If patient does not wish to contact the pharmacy document the reason why and proceed with request.) (Agent: If yes, when and what did the pharmacy advise?)  This is the patient's preferred pharmacy:  [facility] [address], [place] - [address] [place] [address] Phone: [phone] Fax: [phone]    Is this the correct pharmacy for this prescription? Yes If no, delete pharmacy and type the correct one.   Has the prescription been filled recently? Yes  Is the patient out of the medication? Yes  Has the patient been seen for an appointment in the last year OR does the patient have an upcoming appointment? Yes  Can we respond through MyChart? Yes  Agent: Please be advised that Rx refills may take up to 3 business days. We ask that you follow-up with your pharmacy.

## 2023-06-23 MED ORDER — VENLAFAXINE HCL ER 75 MG PO CP24: ORAL_CAPSULE | ORAL | 0 refills | Status: DC

## 2023-06-30 ENCOUNTER — Other Ambulatory Visit: Payer: Self-pay | Admitting: Nurse Practitioner

## 2023-06-30 DIAGNOSIS — C50111 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 female breast: Secondary | ICD-10-CM

## 2023-06-30 DIAGNOSIS — R232 Flushing: Secondary | ICD-10-CM

## 2023-07-07 ENCOUNTER — Other Ambulatory Visit: Payer: Self-pay | Admitting: Family

## 2023-07-07 DIAGNOSIS — L309 Dermatitis, unspecified: Secondary | ICD-10-CM

## 2023-07-07 DIAGNOSIS — J309 Allergic rhinitis, unspecified: Secondary | ICD-10-CM

## 2023-07-07 DIAGNOSIS — J45909 Unspecified asthma, uncomplicated: Secondary | ICD-10-CM

## 2023-07-07 MED ORDER — MONTELUKAST SODIUM 10 MG PO TABS: ORAL_TABLET | ORAL | 1 refills | Status: AC

## 2023-07-10 ENCOUNTER — Encounter: Payer: Medicare Other | Admitting: Internal Medicine

## 2023-09-18 ENCOUNTER — Other Ambulatory Visit: Payer: Self-pay

## 2023-09-18 ENCOUNTER — Other Ambulatory Visit: Payer: Self-pay | Admitting: Family

## 2023-09-18 DIAGNOSIS — C50111 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 female breast: Secondary | ICD-10-CM

## 2023-09-18 DIAGNOSIS — Z170 Estrogen receptor positive status [ER+]: Secondary | ICD-10-CM

## 2023-09-18 DIAGNOSIS — R232 Flushing: Secondary | ICD-10-CM

## 2023-09-19 ENCOUNTER — Inpatient Hospital Stay (HOSPITAL_BASED_OUTPATIENT_CLINIC_OR_DEPARTMENT_OTHER): Payer: Medicare Other | Admitting: Hematology

## 2023-09-19 ENCOUNTER — Inpatient Hospital Stay: Payer: Medicare Other | Attending: Physician Assistant

## 2023-09-19 VITALS — BP 164/80 | HR 71 | Temp 97.7°F | Resp 16 | Ht 65.0 in | Wt 140.2 lb

## 2023-09-19 DIAGNOSIS — G4700 Insomnia, unspecified: Secondary | ICD-10-CM | POA: Diagnosis not present

## 2023-09-19 DIAGNOSIS — Z9221 Personal history of antineoplastic chemotherapy: Secondary | ICD-10-CM | POA: Diagnosis not present

## 2023-09-19 DIAGNOSIS — Z853 Personal history of malignant neoplasm of breast: Secondary | ICD-10-CM | POA: Diagnosis present

## 2023-09-19 DIAGNOSIS — Z79899 Other long term (current) drug therapy: Secondary | ICD-10-CM | POA: Insufficient documentation

## 2023-09-19 DIAGNOSIS — Z803 Family history of malignant neoplasm of breast: Secondary | ICD-10-CM | POA: Diagnosis not present

## 2023-09-19 DIAGNOSIS — Z170 Estrogen receptor positive status [ER+]: Secondary | ICD-10-CM

## 2023-09-19 DIAGNOSIS — C50111 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 female breast: Secondary | ICD-10-CM

## 2023-09-19 DIAGNOSIS — Z08 Encounter for follow-up examination after completed treatment for malignant neoplasm: Secondary | ICD-10-CM | POA: Diagnosis present

## 2023-09-19 DIAGNOSIS — Z9013 Acquired absence of bilateral breasts and nipples: Secondary | ICD-10-CM | POA: Diagnosis not present

## 2023-09-19 DIAGNOSIS — N951 Menopausal and female climacteric states: Secondary | ICD-10-CM | POA: Diagnosis not present

## 2023-09-19 DIAGNOSIS — Z1501 Genetic susceptibility to malignant neoplasm of breast: Secondary | ICD-10-CM

## 2023-09-19 DIAGNOSIS — Z1509 Genetic susceptibility to other malignant neoplasm: Secondary | ICD-10-CM | POA: Diagnosis not present

## 2023-09-19 LAB — CMP (CANCER CENTER ONLY)
ALT: 15 U/L (ref 0–44)
AST: 18 U/L (ref 15–41)
Albumin: 4.7 g/dL (ref 3.5–5.0)
Alkaline Phosphatase: 92 U/L (ref 38–126)
Anion gap: 8 (ref 5–15)
BUN: 18 mg/dL (ref 8–23)
CO2: 27 mmol/L (ref 22–32)
Calcium: 9.8 mg/dL (ref 8.9–10.3)
Chloride: 104 mmol/L (ref 98–111)
Creatinine: 0.73 mg/dL (ref 0.44–1.00)
GFR, Estimated: >60 mL/min (ref >60)
Glucose, Bld: 88 mg/dL (ref 70–99)
Potassium: 4.6 mmol/L (ref 3.5–5.1)
Sodium: 139 mmol/L (ref 135–145)
Total Bilirubin: 0.4 mg/dL (ref 0.0–1.2)
Total Protein: 7.4 g/dL (ref 6.5–8.1)

## 2023-09-19 LAB — CBC WITH DIFFERENTIAL (CANCER CENTER ONLY)
Abs Immature Granulocytes: 0.03 K/uL (ref 0.00–0.07)
Basophils Absolute: 0 K/uL (ref 0.0–0.1)
Basophils Relative: 1 %
Eosinophils Absolute: 0.1 K/uL (ref 0.0–0.5)
Eosinophils Relative: 1 %
HCT: 40.3 % (ref 36.0–46.0)
Hemoglobin: 13.7 g/dL (ref 12.0–15.0)
Immature Granulocytes: 1 %
Lymphocytes Relative: 26 %
Lymphs Abs: 1.4 K/uL (ref 0.7–4.0)
MCH: 33 pg (ref 26.0–34.0)
MCHC: 34 g/dL (ref 30.0–36.0)
MCV: 97.1 fL (ref 80.0–100.0)
Monocytes Absolute: 0.6 K/uL (ref 0.1–1.0)
Monocytes Relative: 11 %
Neutro Abs: 3.3 K/uL (ref 1.7–7.7)
Neutrophils Relative %: 60 %
Platelet Count: 233 K/uL (ref 150–400)
RBC: 4.15 MIL/uL (ref 3.87–5.11)
RDW: 12.5 % (ref 11.5–15.5)
WBC Count: 5.4 K/uL (ref 4.0–10.5)
nRBC: 0 % (ref 0.0–0.2)

## 2023-09-19 NOTE — Progress Notes (Signed)
 [facility]   [phone] [phone]   Clinic Follow up Note   Patient Care Team: Tonita Fallow, MD as PCP - General (Internal Medicine) Crawford, Morna Pickle, NP as Nurse Practitioner (Hematology and Oncology) Lanny Callander, MD as Consulting Physician (Hematology) Ebbie Cough, MD as Consulting Physician (General Surgery)  Date of Service:  09/19/2023  CHIEF COMPLAINT: f/u of right breast cancer  CURRENT THERAPY:  Cancer surveillance  Oncology History   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 breast in female, estrogen receptor positive (HCC) G2, Stage IA, pT1cN0M0 ER weakly positive, PR and HER-2 negative. Oncotype RS 49, high risk  -Diagnosed in 02/2016. S/p b/l mastectomy and BSO due to her BRCA1+ mutation. She completed adjuvant chemo AC-T.  -She started antiestrogen with Anastrozole  in 10/2016 and stopped after 2 weeks due to side effects. Switched to Letrozole  11/30/16. She completed 6.5 years therapy in early 2025.  -given bilateral mastectomy, no role for mammograms   BRCA1 positive -She is s/p bilateral mastectomy and BSO. -does not meet criteria for pancreatic cancer screening due to no family history but understands her slightly increased risk  -Her sister has done genetic testing, results were negative. She has no children.   Assessment & Plan Breast cancer, status post mastectomy, completed letrozole  Breast cancer diagnosed seven years ago, status post mastectomy. Completed letrozole  therapy. Risk of recurrence is low, especially given the cancer's characteristics. No current evidence of recurrence. Surgical scars are well-healed, and no lumps are detected on the chest wall. Blood counts are normal, and previous liver function tests were normal. - Continue regular follow-up visits, especially since she does not have a primary care physician yet. - Consider follow-up next year if she establishes care with a primary care  physician. - Monitor for any signs of recurrence up to ten years post-diagnosis.  Hot flashes Persistent hot flashes for over twenty years. Currently managed with Effexor . - Continue Effexor  for management of hot flashes. - Refill Effexor  as needed.  Insomnia Insomnia managed with gabapentin  and clonidine , which she takes at night to aid sleep. - Continue gabapentin  and clonidine  for insomnia management. - Refill gabapentin  and clonidine  as needed.  Plan - She is clinically doing very well, lab reviewed, exam was unremarkable, no clinical concern for cancer recurrence - Continue cancer surveillance.  Lab and follow-up in 1 year   SUMMARY OF ONCOLOGIC HISTORY: Oncology History Overview Note  Cancer Staging 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 breast in female, estrogen receptor positive (HCC) Staging form: Breast, AJCC 8th Edition - Clinical stage from 03/08/2016: Stage IA (cT1c, cN0, cM0, G2, ER: Positive, PR: Negative, HER2: Negative) - Signed by Callander Lanny, MD on 03/15/2016 - Pathologic stage from 04/12/2016: Stage IA (pT1c, pN0, cM0, G2, ER: Positive, PR: Negative, HER2: Negative, Oncotype DX score: 49) - Signed by Callander Lanny, MD on 05/05/2016     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 breast in female, estrogen receptor positive (HCC)  03/04/2016 Mammogram   Diagnostic bilateral mammogram and right breast ultrasound showed a 1.7 cm mass in the 12:00 position of the right breast, highly suspicious for malignancy. There is a borderline abnormal right axillary lymph node, also suspicious for metastasis.    03/08/2016 Initial Diagnosis   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 breast in female, estrogen receptor positive (HCC)   03/08/2016 Initial Biopsy   Right breast 12:00 core needle biopsy showed invasive ductal carcinoma, grade 2, right axillary lymph node biopsy was negative.  03/08/2016 Receptors her2   ER 20% positive, PR negative, HER-2 negative, Ki-67  20%   03/30/2016 Imaging   MR Bilateral Breast 03/30/2016 IMPRESSION: Known unifocal right breast malignancy. No MRI evidence of malignancy in the left breast.   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  Genes Analyzed: 43 genes on [facility]'s Common Cancers panel (APC, ATM, AXIN2, BARD1, BMPR1A, BRCA1, BRCA2, BRIP1, CDH1, CDKN2A, CHEK2, DICER1, EPCAM, GREM1, HOXB13, KIT, MEN1, MLH1, MSH2, MSH6, MUTYH, NBN, NF1, PALB2, PDGFRA, PMS2, POLD1, POLE, PTEN, RAD50, RAD51C, RAD51D, SDHA, SDHB, SDHC, SDHD, SMAD4, SMARCA4, STK11, TP53, TSC1, TSC2, VHL).    04/12/2016 Surgery   Bilateral mastectomy and right sentinel lymph node biopsy, by Dr. Ebbie   04/12/2016 Pathology Results   Right breast mastectomy showed invasive grade 2 invasive ductal carcinoma, 1.7 cm, margins were negative, 3 sentinel lymph nodes were negative, left simple mastectomy fibroadenoma, one benign node, no atypia or tumor.    05/04/2016 Oncotype testing   Recurrence score 49, high-risk, predicts 10 year risk of distant recurrence 32% with tamoxifen alone.   05/10/2016 Echocardiogram   Echo on 05/10/16 showed EF 55-60%.   05/20/2016 - 09/29/2016 Chemotherapy   dose dense Adriamycin  and Cytoxan , every 2 weeks starting 05/20/16 for 4 cycles, followed by Taxol  weekly (Taxol  Changed to Abaraxane 100mg /m2 on 09/01/17 due to skin rash).    10/2016 -  Anti-estrogen oral therapy    Anastrozole  started in 10/2016 and stopped after 2 weeks due to side effects. Switched to Letrozole  11/30/2016    12/27/2016 Imaging   Bone Density 12/27/16  ASSESSMENT: The BMD measured at Femur Neck Right is 0.938 g/cm2 with a T-score of -0.7. This patient is considered normal according to World Health Organization [facility]) criteria.  Site Region Measured Date Measured Age YA BMD Significant CHANGE T-score DualFemur Neck Right 12/27/2016    63.0         -0.7    0.938 g/cm2  AP Spine  L1-L4      12/27/2016    63.0          0.0     1.200 g/cm2   12/27/2016 Imaging   12/27/2016 Bone Density ASSESSMENT: The BMD measured at Femur Neck Right is 0.938 g/cm2 with a T-score of -0.7.   01/05/2017 Survivorship   Survivorship Clinic with with Morna Dalton Kendall, NP     01/09/2017 Surgery   LAPAROSCOPIC SALPINGO OOPHORECTOMY with pelvic washings by Dr. Cathlyn JAYSON Cary and Dr. Jertson 01/09/17  Diagnosis Ovaries and fallopian tubes, bilateral - BENIGN OVARIES AND FALLOPIAN TUBES WITH PARATUBAL CYST - NO MALIGNANCY IDENTIFIED    Breast cancer, right (HCC)  04/12/2016 Initial Diagnosis   Breast cancer, right (HCC)      Discussed the use of AI scribe software for clinical note transcription with the patient, who gave verbal consent to proceed.  History of Present Illness Chelsea Salinas is a 68 year old female with breast cancer who presents for follow-up.  She was diagnosed with breast cancer seven years ago and underwent a mastectomy. She was on letrozole  until earlier this year. She experiences intermittent itching but finds it non-bothersome. She continues to take Effexor  for persistent hot flashes. Her current medications include gabapentin , clonidine , omicetin, and pilocar. She plans to resume baby aspirin and vitamin D . She has no pain, breathing issues, or stomach pain, only hunger. She inquires about drawing blood from the arm where a lymph node was removed due to difficulty with  the other arm.     All other systems were reviewed with the patient and are negative.  MEDICAL HISTORY:  Past Medical History:  Diagnosis Date   BRCA1 positive    BRCA1 mutation c.2138C>G (p.Ser713*) @ [facility]   Breast cancer, right breast (HCC) dx 03/08/2016---  oncolgoist-  dr lanny   Stage 1A (pT1, pN0, pM0)  Grade 2,  ER+, PR-, HER2 - ;  invasive ductal carcinoma s/p  bilateral mastectomies w/ sln bx's (negative) and completed chemotherapy (05-20-2016 to 09-29-2016)   Eczema    Family history of breast  cancer    Family history of genetic disease carrier    paternal relatives with a BRCA mutation   Family history of ovarian cancer    History of abnormal cervical Pap smear age 5s   History of exercise intolerance 06/27/2006   normal   HSV-2 infection    Hypertension    Prediabetes 05/26/2014   Seasonal and perennial allergic rhinitis    Wears contact lenses     SURGICAL HISTORY: Past Surgical History:  Procedure Laterality Date   BREAST BIOPSY Right 04/13/2016   Procedure: Evacuation RIGHT Breast  Hematoma;  Surgeon: Donnice Bury, MD;  Location: [facility] OR;  Service: General;  Laterality: Right;   COLONOSCOPY     DILATION AND CURETTAGE OF UTERUS  2008   LAPAROSCOPIC SALPINGO OOPHERECTOMY Bilateral 01/09/2017   Procedure: LAPAROSCOPIC SALPINGO OOPHORECTOMY with pelvic washings;  Surgeon: Cathlyn JAYSON Nikki Bobie FORBES, MD;  [facility];  Service: Gynecology;  Laterality: Bilateral;   MASTECTOMY W/ SENTINEL NODE BIOPSY Bilateral 04/12/2016   Procedure: BILATERAL TOTAL MASTECTOMIES  WITH RIGHT SENTINEL LYMPH NODE BIOPSY;  Surgeon: Donnice Bury, MD;  Location: [facility] OR;  Service: General;  Laterality: Bilateral;   PORTACATH PLACEMENT Right 05/19/2016   Procedure: INSERTION PORT-A-CATH WITH US ;  Surgeon: Donnice Bury, MD;  Location: [facility] OR;  Service: General;  Laterality: Right;   TRANSTHORACIC ECHOCARDIOGRAM  05/10/2016   ef 55-60%, grade 1 diastolic dyfuntion/  trivial TR    I have reviewed the social history and family history with the patient and they are unchanged from previous note.  ALLERGIES:  has no known allergies.  MEDICATIONS:  Current Outpatient Medications  Medication Sig Dispense Refill   aspirin EC 81 MG tablet Take 81 mg by mouth daily.     cloNIDine  (CATAPRES ) 0.2 MG tablet Take  1 tablet  Daily  for BP 90 tablet 3   Fezolinetant  (VEOZAH ) 45 MG TABS Take 1 tablet (45 mg total) by mouth daily. 30 tablet 6   gabapentin  (NEURONTIN ) 300 MG capsule  TAKE 1 TO 2 CAPSULES BY MOUTH AT BEDTIME 90 capsule 2   MAGNESIUM PO Take by mouth daily.     methylPREDNISolone  (MEDROL  DOSEPAK) 4 MG TBPK tablet As directed per pack 21 tablet 0   montelukast  (SINGULAIR ) 10 MG tablet Take  1 tablet  Daily for Allergies                                                             /  TAKE                                                          BY                                                  MOUTH                                           ONCE  ??  DAILY 90 tablet 1   olmesartan  (BENICAR ) 40 MG tablet Take 1 tablet (40 mg total) by mouth daily. 90 tablet 1   venlafaxine  XR (EFFEXOR -XR) 75 MG 24 hr capsule TAKE 2 CAPSULES BY MOUTH IN THE MORNING 1 AT BEDTIME 270 capsule 0   VITAMIN D , CHOLECALCIFEROL, PO Take by mouth daily.     VITAMIN E PO Take by mouth daily.     Zinc 50 MG TABS Take by mouth.     No current facility-administered medications for this visit.    PHYSICAL EXAMINATION: ECOG PERFORMANCE STATUS: 0 - Asymptomatic  Vitals:   09/19/23 1402 09/19/23 1405  BP: (!) 167/89 (!) 164/80  Pulse: 71   Resp: 16   Temp: 97.7 F (36.5 C)   SpO2: 100%    Wt Readings from Last 3 Encounters:  09/19/23 140 lb 3.2 oz (63.6 kg)  01/26/23 141 lb 3.2 oz (64 kg)  09/19/22 138 lb 3.2 oz (62.7 kg)     GENERAL:alert, no distress and comfortable SKIN: skin color, texture, turgor are normal, no rashes or significant lesions EYES: normal, Conjunctiva are pink and non-injected, sclera clear NECK: supple, thyroid  normal size, non-tender, without nodularity LYMPH:  no palpable lymphadenopathy in the cervical, axillary  LUNGS: clear to auscultation and percussion with normal breathing effort HEART: regular rate & rhythm and no murmurs and no lower extremity edema ABDOMEN:abdomen soft, non-tender and normal bowel sounds Musculoskeletal:no cyanosis of digits and no clubbing  NEURO: alert & oriented x 3 with  fluent speech, no focal motor/sensory deficits BREAST: Post-mastectomy surgical scar minimal, well-healed. No lumps on chest wall. Physical Exam   LABORATORY DATA:  I have reviewed the data as listed    Latest Ref Rng & Units 09/19/2023    1:33 PM 01/26/2023    4:42 PM 09/19/2022   11:21 AM  CBC  WBC 4.0 - 10.5 K/uL 5.4  7.1  5.0   Hemoglobin 12.0 - 15.0 g/dL 86.2  86.6  85.9   Hematocrit 36.0 - 46.0 % 40.3  40.0  41.3   Platelets 150 - 400 K/uL 233  224  206         Latest Ref Rng & Units 09/19/2023    1:33 PM 01/26/2023    4:42 PM 09/19/2022   11:21 AM  CMP  Glucose 70 - 99 mg/dL 88  89  97   BUN 8 - 23 mg/dL 18  20  14    Creatinine 0.44 - 1.00 mg/dL 9.26  9.28  9.33   Sodium 135 - 145 mmol/L 139  140  142  Potassium 3.5 - 5.1 mmol/L 4.6  4.6  4.8   Chloride 98 - 111 mmol/L 104  101  103   CO2 22 - 32 mmol/L 27  30  32   Calcium 8.9 - 10.3 mg/dL 9.8  9.8  9.7   Total Protein 6.5 - 8.1 g/dL 7.4  6.9  7.1   Total Bilirubin 0.0 - 1.2 mg/dL 0.4  0.4  0.5   Alkaline Phos 38 - 126 U/L 92   76   AST 15 - 41 U/L 18  18  19    ALT 0 - 44 U/L 15  14  16        RADIOGRAPHIC STUDIES: I have personally reviewed the radiological images as listed and agreed with the findings in the report. No results found.    No orders of the defined types were placed in this encounter.  All questions were answered. The patient knows to call the clinic with any problems, questions or concerns. No barriers to learning was detected. The total time spent in the appointment was 20 minutes, including review of chart and various tests results, discussions about plan of care and coordination of care plan     Onita Mattock, MD 09/19/2023

## 2023-09-19 NOTE — Assessment & Plan Note (Signed)
-  She is s/p bilateral mastectomy and BSO. -does not meet criteria for pancreatic cancer screening due to no family history but understands her slightly increased risk  -Her sister has done genetic testing, results were negative. She has no children.

## 2023-09-19 NOTE — Assessment & Plan Note (Addendum)
 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 Oncotype RS 49, high risk  -Diagnosed in 02/2016. S/p b/l mastectomy and BSO due to her BRCA1+ mutation. She completed adjuvant chemo AC-T.  -She started antiestrogen with Anastrozole  in 10/2016 and stopped after 2 weeks due to side effects. Switched to Letrozole  11/30/16. She completed 6.5 years therapy in early 2025.  -given bilateral mastectomy, no role for mammograms

## 2023-09-25 ENCOUNTER — Encounter: Payer: Self-pay | Admitting: Emergency Medicine

## 2023-09-25 ENCOUNTER — Other Ambulatory Visit: Payer: Self-pay | Admitting: Family

## 2023-09-25 ENCOUNTER — Encounter: Payer: Self-pay | Admitting: Family Medicine

## 2023-09-25 DIAGNOSIS — R232 Flushing: Secondary | ICD-10-CM

## 2023-09-25 MED ORDER — VENLAFAXINE HCL ER 75 MG PO CP24
ORAL_CAPSULE | ORAL | 0 refills | Status: AC
Start: 2023-09-25 — End: ?

## 2023-09-25 NOTE — Telephone Encounter (Signed)
 [place] pt, has upcoming NP appt w Dr. Sagardia

## 2023-09-25 NOTE — Telephone Encounter (Signed)
 I do not know who this patient is.  Have never seen her as a patient.  Thanks.

## 2023-11-12 ENCOUNTER — Other Ambulatory Visit: Payer: Self-pay | Admitting: Nurse Practitioner

## 2023-11-12 DIAGNOSIS — R232 Flushing: Secondary | ICD-10-CM

## 2023-11-12 DIAGNOSIS — C50111 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 female breast: Secondary | ICD-10-CM

## 2023-11-13 ENCOUNTER — Encounter: Payer: Self-pay | Admitting: Hematology

## 2023-12-21 ENCOUNTER — Other Ambulatory Visit: Payer: Self-pay | Admitting: Family

## 2023-12-21 DIAGNOSIS — R232 Flushing: Secondary | ICD-10-CM

## 2023-12-25 ENCOUNTER — Other Ambulatory Visit: Payer: Self-pay | Admitting: Nurse Practitioner

## 2023-12-25 DIAGNOSIS — R232 Flushing: Secondary | ICD-10-CM

## 2024-01-05 ENCOUNTER — Other Ambulatory Visit: Payer: Self-pay | Admitting: Family

## 2024-01-05 DIAGNOSIS — L309 Dermatitis, unspecified: Secondary | ICD-10-CM

## 2024-01-05 DIAGNOSIS — J45909 Unspecified asthma, uncomplicated: Secondary | ICD-10-CM

## 2024-01-05 DIAGNOSIS — J309 Allergic rhinitis, unspecified: Secondary | ICD-10-CM

## 2024-01-29 ENCOUNTER — Ambulatory Visit: Payer: Medicare Other | Admitting: Emergency Medicine

## 2024-01-30 ENCOUNTER — Ambulatory Visit: Payer: Medicare Other | Admitting: Nurse Practitioner

## 2024-09-18 ENCOUNTER — Ambulatory Visit: Admitting: Hematology

## 2024-09-18 ENCOUNTER — Other Ambulatory Visit
